# Patient Record
Sex: Female | Born: 1935 | Race: White | Hispanic: No | Smoking: Never smoker
Health system: Southern US, Community
[De-identification: ages and names within clinical notes are randomized; demographics above are authoritative.]

## PROBLEM LIST (undated history)

## (undated) DIAGNOSIS — I509 Heart failure, unspecified: Secondary | ICD-10-CM

## (undated) DIAGNOSIS — I5022 Chronic systolic (congestive) heart failure: Secondary | ICD-10-CM

## (undated) DIAGNOSIS — T8859XA Other complications of anesthesia, initial encounter: Secondary | ICD-10-CM

## (undated) DIAGNOSIS — Z950 Presence of cardiac pacemaker: Secondary | ICD-10-CM

## (undated) DIAGNOSIS — J449 Chronic obstructive pulmonary disease, unspecified: Secondary | ICD-10-CM

## (undated) DIAGNOSIS — M159 Polyosteoarthritis, unspecified: Secondary | ICD-10-CM

## (undated) DIAGNOSIS — M810 Age-related osteoporosis without current pathological fracture: Secondary | ICD-10-CM

## (undated) DIAGNOSIS — I4891 Unspecified atrial fibrillation: Secondary | ICD-10-CM

## (undated) DIAGNOSIS — T4145XA Adverse effect of unspecified anesthetic, initial encounter: Secondary | ICD-10-CM

## (undated) HISTORY — PX: CARDIOVERSION: SHX1299

## (undated) HISTORY — DX: Unspecified atrial fibrillation: I48.91

## (undated) HISTORY — DX: Chronic systolic (congestive) heart failure: I50.22

## (undated) HISTORY — DX: Age-related osteoporosis without current pathological fracture: M81.0

## (undated) HISTORY — PX: INSERT / REPLACE / REMOVE PACEMAKER: SUR710

## (undated) HISTORY — DX: Polyosteoarthritis, unspecified: M15.9

## (undated) HISTORY — PX: HIP FRACTURE SURGERY: SHX118

---
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
## 2007-10-09 ENCOUNTER — Other Ambulatory Visit: Payer: Self-pay

## 2007-10-09 ENCOUNTER — Ambulatory Visit: Payer: Self-pay | Admitting: Internal Medicine

## 2007-10-09 ENCOUNTER — Inpatient Hospital Stay: Payer: Self-pay | Admitting: Internal Medicine

## 2007-10-15 ENCOUNTER — Other Ambulatory Visit: Payer: Self-pay

## 2007-10-16 ENCOUNTER — Other Ambulatory Visit: Payer: Self-pay

## 2013-05-04 ENCOUNTER — Ambulatory Visit (INDEPENDENT_AMBULATORY_CARE_PROVIDER_SITE_OTHER): Payer: Medicare Other | Admitting: Internal Medicine

## 2013-05-04 ENCOUNTER — Encounter: Payer: Self-pay | Admitting: Internal Medicine

## 2013-05-04 VITALS — BP 110/60 | HR 50 | Temp 97.7°F | Ht 59.0 in | Wt 142.0 lb

## 2013-05-04 DIAGNOSIS — F0280 Dementia in other diseases classified elsewhere without behavioral disturbance: Secondary | ICD-10-CM

## 2013-05-04 DIAGNOSIS — I4891 Unspecified atrial fibrillation: Secondary | ICD-10-CM | POA: Insufficient documentation

## 2013-05-04 DIAGNOSIS — M159 Polyosteoarthritis, unspecified: Secondary | ICD-10-CM | POA: Insufficient documentation

## 2013-05-04 DIAGNOSIS — F068 Other specified mental disorders due to known physiological condition: Secondary | ICD-10-CM

## 2013-05-04 DIAGNOSIS — I480 Paroxysmal atrial fibrillation: Secondary | ICD-10-CM | POA: Insufficient documentation

## 2013-05-04 DIAGNOSIS — I5022 Chronic systolic (congestive) heart failure: Secondary | ICD-10-CM

## 2013-05-04 DIAGNOSIS — M810 Age-related osteoporosis without current pathological fracture: Secondary | ICD-10-CM | POA: Insufficient documentation

## 2013-05-04 NOTE — Progress Notes (Signed)
Subjective:    Patient ID: Diana Powell, female    DOB: [birth year], 79 y.o.   MRN: [id number]  HPI Here with son [place] interpreter--- Shary Key Has been here in [place] for 5 years with green card Had been back and forth from [place] for 15 years before that  Has been getting charity care at [facility] Now on [place] and has been assigned a different doctor  Has history of CHF--sounds like systolic Hospitalized in 2009 for this  Permanent atrial fibrillation On coumadin and metoprolol  Has osteoporosis and arthritis  Most important issue is her immigration status They are hoping to get [place] citizenship but she cannot fulfill the citizenship and language parts  She does have mental decline that goes back over 2 years She was able to talk and understand some English--- this has declined and worsened Now has expressive problems even in [place] There is memory issues---forgetting names she always knew, names of actions and activities Forgets her medications, no longer able to read and do puzzles, etc No longer able to cook like in the past (or recount her recipes) No longer can count to do her cross-stitching Has gotten confused about where she is--thought she was in [place] again or in one of her daughter's houses Now reclusive--not willing to go out of the house or backyard  No history of stroke No CT or MRI in past that son is aware of  No current outpatient prescriptions on file prior to visit.   No current facility-administered medications on file prior to visit.    No Known Allergies  Past Medical History  Diagnosis Date  . Atrial fibrillation   . Chronic systolic heart failure   . Osteoporosis, post-menopausal   . Osteoarthritis, multiple sites     Past Surgical History  Procedure Laterality Date  . Cardioversion  ~2009  . Hip fracture surgery Bilateral     each hip fractured at separate times    No family history on file.  History   Social History  .  Marital Status: Widowed    Spouse Name: N/A    Number of Children: 3  . Years of Education: N/A   Occupational History  . 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 Smoking status: Never Smoker   . Smokeless tobacco: Never Used  . Alcohol Use: Yes  . Drug Use: No  . Sexual Activity: Not on file   Other Topics Concern  . Not on file   Social History Narrative   Widowed twice   Son and 2 daughters   Review of Systems Has had some pain in posterior neck Chronic sleep problems Appetite is waning---may have lost a small amount of weight Has had worsening urinary incontinence over the past few years Gait is slow and labored. Has had some falls in the past year Not depressed    Objective:   Physical Exam  Constitutional: She appears well-developed and well-nourished. No distress.  HENT:  Seems to hear okay in quiet room  Neck: Normal range of motion. Neck supple. No thyromegaly present.  Cardiovascular: Regular rhythm and normal heart sounds.  Exam reveals no gallop.   No murmur heard. Mild bradycardia  Abdominal: Soft. There is no tenderness.  Musculoskeletal: She exhibits no edema and no tenderness.  Lymphadenopathy:    She has no cervical adenopathy.  Neurological:  Through interpreter-- "Friday" doesn't know month or year. "[place], [place]" [phone] Recall 2/3  Clock draw--needed to put  some of the numbers outside the circle  Psychiatric:  Doesn't seem depressed Willing to interact and work with my testing          Assessment & Plan:

## 2013-05-04 NOTE — Assessment & Plan Note (Signed)
Pain doesn't seem to be a problem

## 2013-05-04 NOTE — Assessment & Plan Note (Signed)
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

## 2013-05-04 NOTE — Assessment & Plan Note (Signed)
Seems to be compensated Weight is down a little recently

## 2013-05-04 NOTE — Assessment & Plan Note (Signed)
Regular but slow No symptoms from the bradycardia May need to decrease some of her meds--might be able to stop the diltiazem

## 2013-10-21 ENCOUNTER — Inpatient Hospital Stay: Payer: Self-pay | Admitting: Internal Medicine

## 2013-10-21 LAB — CBC
HCT: 36.7 % (ref 35.0–47.0)
HGB: 11.8 g/dL — AB (ref 12.0–16.0)
MCH: 26.2 pg (ref 26.0–34.0)
MCHC: 32.1 g/dL (ref 32.0–36.0)
MCV: 82 fL (ref 80–100)
PLATELETS: 255 10*3/uL (ref 150–440)
RBC: 4.5 10*6/uL (ref 3.80–5.20)
RDW: 15 % — AB (ref 11.5–14.5)
WBC: 7.9 10*3/uL (ref 3.6–11.0)

## 2013-10-21 LAB — BASIC METABOLIC PANEL
Anion Gap: 5 — ABNORMAL LOW (ref 7–16)
BUN: 20 mg/dL — ABNORMAL HIGH (ref 7–18)
CHLORIDE: 101 mmol/L (ref 98–107)
CO2: 32 mmol/L (ref 21–32)
Calcium, Total: 8.5 mg/dL (ref 8.5–10.1)
Creatinine: 1.43 mg/dL — ABNORMAL HIGH (ref 0.60–1.30)
EGFR (African American): 39 — ABNORMAL LOW
GFR CALC NON AF AMER: 34 — AB
Glucose: 111 mg/dL — ABNORMAL HIGH (ref 65–99)
OSMOLALITY: 279 (ref 275–301)
Potassium: 3.6 mmol/L (ref 3.5–5.1)
SODIUM: 138 mmol/L (ref 136–145)

## 2013-10-21 LAB — PROTIME-INR
INR: >15.1
INR: 1.7
PROTHROMBIN TIME: 19.6 s — AB (ref 11.5–14.7)
Prothrombin Time: >120 secs (ref 11.5–14.7)

## 2013-10-21 LAB — TROPONIN I: Troponin-I: <0.02 ng/mL

## 2013-10-22 LAB — BASIC METABOLIC PANEL
Anion Gap: 4 — ABNORMAL LOW (ref 7–16)
BUN: 20 mg/dL — ABNORMAL HIGH (ref 7–18)
CHLORIDE: 100 mmol/L (ref 98–107)
CREATININE: 1.19 mg/dL (ref 0.60–1.30)
Calcium, Total: 8.4 mg/dL — ABNORMAL LOW (ref 8.5–10.1)
Co2: 32 mmol/L (ref 21–32)
EGFR (African American): 49 — ABNORMAL LOW
EGFR (Non-African Amer.): 42 — ABNORMAL LOW
Glucose: 150 mg/dL — ABNORMAL HIGH (ref 65–99)
OSMOLALITY: 277 (ref 275–301)
Potassium: 3.5 mmol/L (ref 3.5–5.1)
SODIUM: 136 mmol/L (ref 136–145)

## 2013-10-22 LAB — PROTIME-INR
INR: 1.4
Prothrombin Time: 17 secs — ABNORMAL HIGH (ref 11.5–14.7)

## 2014-11-09 NOTE — Discharge Summary (Signed)
PATIENT NAME:  Diana Powell, Iyannah MR#:  [id number] DATE OF BIRTH:  1926-09-04  DATE OF ADMISSION:  10/21/2013  DATE OF DISCHARGE:  10/22/2013  ADMITTING DIAGNOSES:  Severe neck pain as well as supratherapeutic INR.   DISCHARGE DIAGNOSES:  1.  Severe neck pain due to severe C-spine degenerative disc disease needs outpatient neurosurgical follow up.  2.  Supratherapeutic INR, possibly related to medication interaction. INR greater than 15.1 on presentation. On discharge, it was 1.4.  3.  History of atrial fibrillation.  4.  Chronic neck pain.  5.  History of congestive heart failure, unknown ejection fraction.  6.  Osteoporosis.  7.  Status post bilateral hip replacement surgeries.   PERTINENT LABS AND EVALUATIONS:  Admitting WBC 7.9, hemoglobin 11.8, platelet count 255,000. Sodium 138, potassium 3.6, chloride 111, bicarbonate 32, BUN 20, creatinine 1.40, glucose 111. INR greater than 15. Troponin less than 0.02.   Chest x-ray showed convex right-sided thoracic scoliosis, mild cardiomegaly, no acute pulmonary processes. CT of the head was negative CT of the neck with contrast showing no acute findings. However, there was C5-C6, C6-C7 severe left-sided neck disc space narrowing on the left side.    EKG: Normal sinus rhythm with no ST-T wave changes.   HOSPITAL COURSE: Please refer to H and P done by the admitting physician. The patient is an 81 year old [place] speaking white female presented with basically worsening neck pain. She does have a history of chronic neck pain, but it had gotten so severe, and she was in a lot of pain. Due to this, she came to the ED. She was evaluated in the ER had a CT scan of the neck which did not show any acute abnormality. Did show severe disc space narrowing. She also had an INR checked because she was on Coumadin. Her INR was significantly elevated. Due to these symptoms, she was admitted for her elevated INR. She was given FFP in the ER. Subsequently INR did drift  down to normal. She was admitted to the hospital, pain control with Medrol Dosepak, and other pain medications, muscle relaxants were started. With these treatments, her symptoms did improve. She will need an outpatient neurosurgical back spinal specialist to see her for further evaluation and treatment.   DISCHARGE MEDICATIONS: At the time of discharge:  Lasix 20 daily as needed for swelling, tramadol 50 q. 6 p.r.n. for pain, triamcinolone topically applied to affected area b.i.d. as before, calcium plus vitamin D 1 tablet daily, amiodarone 400 daily, metoprolol 100 mg 1 tablet p.o. b.i.d., verapamil 120 1/2 tablet daily, Coumadin 2 mg daily, prednisone taper starts at 60 mg, 10 mg until complete, Skelaxin 800 mg 1 tablet p.o. t.i.d. as needed for muscle spasms, Percocet 5/325 q. 6 p.r.n. for pain. Due to her renal function and being elevated her chlorthalidone was held.   HOME HEALTH:  No.   DIET: Low-sodium, low-fat, low-cholesterol.   ACTIVITY: As tolerated.   FOLLOWUP: With primary M.D. in 1-2 weeks. Also follow [facility] locally or [facility], or in [place] for opinion regarding her neck issues.   TIME SPENT: 35 minutes.     ____________________________ Lacie ScottsShreyang H. Allena KatzPatel, MD shp:tc D: 10/23/2013 11:27:14 ET T: 10/23/2013 12:23:14 ET JOB#: [id number]  cc: Idara Woodside H. Allena KatzPatel, MD, <Dictator> Charise CarwinSHREYANG H Karrington Studnicka MD ELECTRONICALLY SIGNED 10/31/2013 15:38

## 2014-11-09 NOTE — H&P (Signed)
PATIENT NAME:  Diana Powell, Diana Powell MR#:  [id number] DATE OF BIRTH:  1926-10-05  DATE OF ADMISSION:  10/21/2013  ADMITTING PHYSICIAN: Enid Baasadhika Niki Payment, M.D.   PRIMARY CARE PHYSICIAN: [facility] Mar[facility]obin [facility]   PRIMARY CARDIOLOGIST: [facility]   CHIEF COMPLAINT: Severe neck pain.   HISTORY OF PRESENT ILLNESS: Diana Powell is an 81 year old [place] speaking elderly  female with past medical history significant for atrial fibrillation, on Coumadin, congestive heart failure with unknown ejection fraction and osteoporosis, who presents to the hospital secondary to acute onset of neck pain that started last evening. The patient can only speak [place] and her son at bedside is able to translate at this time. According to the son, the patient has been having the neck pain for more than six months and primary care physician has been trying to use Percocet and tramadol as needed.  However, she has some good days and days, and she has been feeling better over the last week without any pain, but suddenly yesterday  she started to feel bad with extremely worsening neck pain, spasms in the trapezius muscles and unable to move her neck. She is wearing a soft neck support, felt better for a short while and again last night pain got worse to the point that they had to  come to the Emergency Room. In the Emergency Room she is in a soft collar, got some pain medications, still feels very uncomfortable  and labs reveal that INR is greater than 15. She does have mild conjunctival hemorrhage on the left,  but denies any bleeding from anywhere else. She is being admitted for her neck pain and also supratherapeutic INR.   PAST MEDICAL HISTORY: 1. Atrial fibrillation on Coumadin.  2. Congestive heart failure with unknown ejection fraction.  3. Osteoporosis.   PAST SURGICAL HISTORY: Bilateral hip replacement surgery.   ALLERGIES TO MEDICATIONS: No known drug allergies.   CURRENT HOME MEDICATIONS:  1.  Amiodarone 400 mg p.o. daily.  2. Calcium vitamin D 1 tablet p.o. daily.  3. Chlorthalidone 25 mg p.o. daily.  4. Coumadin 2.5 mg p.o. daily.  5. Lasix 20 mg p.o. daily.  6. Metoprolol 100 mg p.o. b.i.d.  7. Tramadol 50 mg p.o. q.6h. p.r.n.  8. Triamcinolone 0.1% apply  twice a day.  9. Verapamil 60 mg p.o. daily.   SOCIAL HISTORY: Lives at home with her son, ambulates with a cane at baseline. However, her mobility has been decreased  due to generalized weakness. No history of any smoking or alcohol use.   FAMILY HISTORY: Dad was a World Psychologist, clinicalWar Veteran and passed away in the war  , and mom was healthy up until her 5680s and passed away with natural causes.  REVIEW OF SYSTEMS:  CONSTITUTIONAL: No fever, fatigue or weakness.  EYES: No blurring vision, double vision, inflammation, glaucoma.  ENT: No tinnitus, ear pain, hearing loss, epistaxis or discharge.  RESPIRATORY: No cough, wheeze, hemoptysis, COPD. CARDIOVASCULAR: No chest pain, orthopnea, edema, arrhythmia, palpitations, or syncope.  GASTROINTESTINAL: No nausea, vomiting, diarrhea, or abdominal pain, hematemesis or melena.  GENITOURINARY: No dysuria, hematuria, renal calculus, frequency or incontinence.  ENDOCRINE: No polyuria, nocturia, thyroid problems, heat or  cold intolerance.  HEMATOLOGY: No anemia, easy bruising or bleeding.  SKIN: No acne, rash or lesions.  MUSCULOSKELETAL: Positive for intense neck pain and spasms down the neck muscles and also positive for arthritis. No gout.  NEUROLOGIC:  No numbness, weakness, CVA, TIA or seizures.  PSYCHOLOGICAL: No  anxiety, insomnia or depression.   PHYSICAL EXAMINATION: VITAL SIGNS: Temperature 97.4 degree Fahrenheit, pulse 87, respirations 22, blood pressure 144/88, pulse oximetry 96% on room air.   GENERAL: Well-built, well-nourished female sitting in bed in mild discomfort secondary to her neck pain.  HEENT: Normocephalic, atraumatic. Pupils are equal, round, reacting to light,  anicteric sclerae.   Left eye medial subconjunctival hemorrhage is present. Extraocular movements are intact.  OROPHARYNX: Clear without erythema, masses  or exudate.Marland Kitchen  NECK: Supple, no thyromegaly, JVD, or carotid bruits , no lymphadenopathy.  LUNGS: Moving air bilaterally. No wheeze or crackles. No use of accessory muscles for breathing.  CARDIOVASCULAR: S1 and S2 regular rate and rhythm,  No murmurs, rubs, or gallops.  ABDOMEN: Soft, nontender, nondistended. No hepatosplenomegaly, normal bowel sounds.  EXTREMITIES: No pedal edema. No clubbing or cyanosis, 2+ dorsalis pedis pulse is palpable bilaterally.  SKIN: No acnes, rash or lesions.  LYMPHATICS: no cervical or inguinal lymphadenopathy .  NEUROLOGICAL: Cranial nerves intact. She has a soft neck collar and has some tenderness around the posterior neck at the  base of the neck region; however, normal range of motion is present. no new focal motor or sensory deficits  . PSYCHOLOGICAL: The patient is awake, alert, oriented x 3.   LABORATORY DATA: WBC 7.9, hemoglobin 11.8, hematocrit 36.7, platelet count 255.   Sodium 138, potassium  3.6, chloride 111, bicarbonate 32, BUN 20, creatinine 1.40, glucose 111 and calcium of 8.5 , INR is greater than 15. Troponin is less than 0.02.   Chest x-ray showing convex right sided thoracic scoliosis noted. Mild cardiomegaly, no acute cardiopulmonary disease.   CT of the head without contrast showing atrophy and small vessel ischemic changes. No acute intracranial findings, and CT of the neck with contrast showing no acute finding; however,  there is C5-C6, C6-C7, severe left-sided disk space narrowing and left greater than right facet arthropathy is also noted in the the upper cervical levels. Severe spondylitic changes are present.  EKG showing atrial fibrillation, heart rate of 84. No acute ST-T wave changes.   ASSESSMENT AND PLAN:  This is an 81 year old female with history of atrial fibrillation on  Coumadin, congestive heart failure with ejection fraction of 50% to 55% in the past,  chronic pain, comes in with acute on chronic neck pain and noted to have  INR greater than 15.  1. Supratherapeutic INR, has been on Coumadin for several years, dietary changes recently could have been the cause for this supratherapeutic INR; however hold Coumadin. She has been given FFP,  recheck INR later today and tomorrow a.m.  2. Acute on chronic neck pain. Neck with scoliosis and C5-C7 disc space narrowing worse on left-side- it  could have been the cause. We will start a Medrol Dosepak, Skelaxin, soft neck collar, heating pad and morphine p.r.n.  Neurosurgery follow-up as an outpatient. The patient also has severe scoliosis.  3. Atrial fibrillation, rate controlled. Continue amiodarone, verapamil, and metoprolol. Hold Coumadin. 4. Congestive heart failure,  unknown ejection fraction, likely diastolic dysfunction based on her last echo in 2009 where ejection fraction was 50% to 55%. We will hold Lasix as she appears clinically dehydrated and also with acute renal failure on labs. Gentle hydration and recheck in a.m. 5. Acute renal failure,  could be prerenal.  Gentle hydration and recheck tomorrow. 6. Hypertension, well controlled.  Lasix and chlorthalidone are on hold. Continue with metoprolol and verapamil.    7. CODE STATUS: FULL CODE.   TIME SPENT  ON ADMISSION: 50 minutes.   ____________________________ Enid Baas, MD rk:sg D: 10/21/2013 11:43:01 ET T: 10/21/2013 13:24:06 ET JOB#: [id number]  cc: Enid Baas, MD, <Dictator> Vic Ripper. Mariana Kaufman, MD      Enid Baas MD ELECTRONICALLY SIGNED 11/06/2013 11:17

## 2017-12-17 ENCOUNTER — Emergency Department
Admission: EM | Admit: 2017-12-17 | Discharge: 2017-12-17 | Disposition: A | Discharge status: Home / Self Care | Payer: Medicare Other | Attending: Emergency Medicine | Admitting: Emergency Medicine

## 2017-12-17 ENCOUNTER — Other Ambulatory Visit: Payer: Self-pay

## 2017-12-17 ENCOUNTER — Emergency Department: Payer: Medicare Other

## 2017-12-17 ENCOUNTER — Encounter: Payer: Self-pay | Admitting: Emergency Medicine

## 2017-12-17 DIAGNOSIS — Y93H2 Activity, gardening and landscaping: Secondary | ICD-10-CM | POA: Diagnosis not present

## 2017-12-17 DIAGNOSIS — W010XXA Fall on same level from slipping, tripping and stumbling without subsequent striking against object, initial encounter: Secondary | ICD-10-CM | POA: Insufficient documentation

## 2017-12-17 DIAGNOSIS — S52501A Unspecified fracture of the lower end of right radius, initial encounter for closed fracture: Secondary | ICD-10-CM

## 2017-12-17 DIAGNOSIS — Z79899 Other long term (current) drug therapy: Secondary | ICD-10-CM | POA: Diagnosis not present

## 2017-12-17 DIAGNOSIS — Y998 Other external cause status: Secondary | ICD-10-CM | POA: Diagnosis not present

## 2017-12-17 DIAGNOSIS — I5022 Chronic systolic (congestive) heart failure: Secondary | ICD-10-CM | POA: Diagnosis not present

## 2017-12-17 DIAGNOSIS — Y92017 Garden or yard in single-family (private) house as the place of occurrence of the external cause: Secondary | ICD-10-CM | POA: Diagnosis not present

## 2017-12-17 DIAGNOSIS — F0390 Unspecified dementia without behavioral disturbance: Secondary | ICD-10-CM | POA: Insufficient documentation

## 2017-12-17 DIAGNOSIS — Z7901 Long term (current) use of anticoagulants: Secondary | ICD-10-CM | POA: Diagnosis not present

## 2017-12-17 DIAGNOSIS — S59911A Unspecified injury of right forearm, initial encounter: Secondary | ICD-10-CM | POA: Diagnosis present

## 2017-12-17 MED ORDER — TRAMADOL HCL 50 MG PO TABS: 25.0000 mg | ORAL_TABLET | Freq: Four times a day (QID) | ORAL | 0 refills | Status: AC | PRN

## 2017-12-17 MED ORDER — TRAMADOL HCL 50 MG PO TABS
50.0000 mg | ORAL_TABLET | Freq: Once | ORAL | Status: AC
  Administered 2017-12-17: 50 mg via ORAL
  Filled 2017-12-17: qty 1

## 2017-12-17 NOTE — ED Provider Notes (Signed)
[facility] Emergency Department Provider Note  ____________________________________________  Time seen: Approximately 5:18 PM  I have reviewed the triage vital signs and the nursing notes.   HISTORY  Chief Complaint Wrist Pain    HPI Diana Powell is a 84 y.o. female that presents to the emergency department for evaluation of right arm pain after fall today.  Patient was out working in the garden when she lost her balance and fell.  She did not hit her head or lose consciousness.  She denies any additional injuries.  No numbness or tingling in fingers.   Past Medical History:  Diagnosis Date  . Atrial fibrillation (HCC)   . Chronic systolic heart failure (HCC)   . Osteoarthritis, multiple sites   . Osteoporosis, post-menopausal     Patient Active Problem List   Diagnosis Date Noted  . 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 Atrial fibrillation (HCC)   . Chronic systolic heart failure (HCC)   . Osteoporosis, post-menopausal   . Osteoarthritis, multiple sites     Past Surgical History:  Procedure Laterality Date  . CARDIOVERSION  ~2009  . HIP FRACTURE SURGERY Bilateral [phone]   each hip fractured at separate times    Prior to Admission medications   Medication Sig Start Date End Date Taking? Authorizing Provider  amiodarone (PACERONE) 200 MG tablet Take 200 mg by mouth daily.    [provider]  Calcium Carbonate-Vitamin D (CALCIUM-VITAMIN D) 500-200 MG-UNIT per tablet Take 1 tablet by mouth daily.    [provider]  chlorthalidone (HYGROTON) 25 MG tablet Take 25 mg by mouth daily.    [provider]  furosemide (LASIX) 20 MG tablet Take 20 mg by mouth daily as needed.    [provider]  metoprolol (LOPRESSOR) 100 MG tablet Take 50 mg by mouth 2 (two) times daily.    [provider]  traMADol (ULTRAM) 50 MG tablet Take 0.5 tablets (25 mg total) by mouth  every 6 (six) hours as needed. 12/17/17 12/17/18  Enid Derry, PA-C  verapamil (CALAN-SR) 120 MG CR tablet Take 60 mg by mouth at bedtime.    [provider]  warfarin (COUMADIN) 2.5 MG tablet as directed.    [provider]    Allergies Patient has no known allergies.  History reviewed. No pertinent family history.  Social History Social History   Tobacco Use  . Smoking status: Never Smoker  . Smokeless tobacco: Never Used  Substance Use Topics  . Alcohol use: Yes  . Drug use: No     Review of Systems  Cardiovascular: No chest pain. Respiratory: No SOB. Gastrointestinal:  No nausea, no vomiting.  Musculoskeletal: Positive for wrist pain. Skin: Negative for rash, abrasions, lacerations.  Positive for ecchymosis.  Neurological: Negative for  numbness or tingling   ____________________________________________   PHYSICAL EXAM:  VITAL SIGNS: ED Triage Vitals  Enc Vitals Group     BP 12/17/17 1354 121/62     Pulse Rate 12/17/17 1354 70     Resp 12/17/17 1354 16     Temp 12/17/17 1354 98.6 F (37 C)     Temp Source 12/17/17 1354 Oral     SpO2 12/17/17 1354 96 %     Weight 12/17/17 1353 126 lb (57.2 kg)     Height 12/17/17 1353 5' (1.524 m)     Head Circumference --      Peak Flow --      Pain Score 12/17/17  1359 8     Pain Loc --      Pain Edu? --      Excl. in GC? --      Constitutional: Alert and oriented. Well appearing and in no acute distress. Eyes: Conjunctivae are normal. PERRL. EOMI. Head: Atraumatic. ENT:      Ears:      Nose: No congestion/rhinnorhea.      Mouth/Throat: Mucous membranes are moist.  Neck: No stridor.   Cardiovascular: Normal rate, regular rhythm.  Good peripheral circulation.  Symmetric radial pulses bilaterally.  Cap refill less than 3 seconds. Respiratory: Normal respiratory effort without tachypnea or retractions. Lungs CTAB. Good air entry to the bases with no decreased or absent breath  sounds. Musculoskeletal: No gross deformities appreciated.  Moderate swelling to right wrist.  Ecchymosis to volar wrist. Full ROM of fingers. Neurologic:  Normal speech and language. No gross focal neurologic deficits are appreciated.  Skin:  Skin is warm, dry and intact. No rash noted.    ____________________________________________   LABS (all labs ordered are listed, but only abnormal results are displayed)  Labs Reviewed - No data to display ____________________________________________  EKG   ____________________________________________  RADIOLOGY Lexine BatonI, Malone Admire, personally viewed and evaluated these images (plain radiographs) as part of my medical decision making, as well as reviewing the written report by the radiologist.  Dg Wrist Complete Left  Result Date: 12/17/2017 CLINICAL DATA:  Left wrist a fall injury with swelling. Initial and bruising after encounter. EXAM: LEFT WRIST - COMPLETE 3+ VIEW COMPARISON:  None. FINDINGS: The bones appear diffusely osteopenic. There is a mildly comminuted fracture of the distal radius which extends to the radiocarpal articular surface. There is mild impaction, displacement, and dorsal angulation. A nondisplaced ulnar styloid fracture is questioned. There is diffuse soft tissue swelling about the wrist. Severe first CMC joint osteoarthrosis is noted with advanced joint space narrowing and bulky marginal osteophyte formation. Mild radiocarpal osteoarthrosis is also present. IMPRESSION: 1. Mildly comminuted distal radius fracture as above. 2. Questionable nondisplaced ulnar styloid fracture. Electronically Signed   By: Sebastian AcheAllen  Grady M.D.   On: 12/17/2017 14:36    ____________________________________________    PROCEDURES  Procedure(s) performed:    Procedures    Medications  traMADol (ULTRAM) tablet 50 mg (50 mg Oral Given 12/17/17 1546)     ____________________________________________   INITIAL IMPRESSION / ASSESSMENT AND PLAN  / ED COURSE  Pertinent labs & imaging results that were available during my care of the patient were reviewed by me and considered in my medical decision making (see chart for details).  Review of the [place] CSRS was performed in accordance of the [facility] prior to dispensing any controlled drugs.     Patient's diagnosis is consistent with distal radius fracture. Vital signs and exam are reassuring.  X-ray consistent with mildly comminuted distal radius fracture.  Splint was placed.   Patient will be discharged home with prescriptions for tramadol. Patient is to follow up with orthopedics as directed. Patient is given ED precautions to return to the ED for any worsening or new symptoms.     ____________________________________________  FINAL CLINICAL IMPRESSION(S) / ED DIAGNOSES  Final diagnoses:  Closed fracture of distal end of right radius, unspecified fracture morphology, initial encounter      NEW MEDICATIONS STARTED DURING THIS VISIT:  ED Discharge Orders        Ordered    traMADol (ULTRAM) 50 MG tablet  Every 6 hours PRN     12/17/17  1650          This chart was dictated using voice recognition software/Dragon. Despite best efforts to proofread, errors can occur which can change the meaning. Any change was purely unintentional.    Enid Derry, PA-C 12/17/17 2255    Minna Antis, MD 12/17/17 2306

## 2017-12-17 NOTE — ED Notes (Signed)
Left wrist swelling, pain, bruising s/p fall. Able to wiggle fingers, sensation intact, fingers warm with normal cap refill.

## 2017-12-17 NOTE — ED Triage Notes (Signed)
Pt to ED c/o left wrist injury with some swelling and bruising.  States was in her garden and tripped and fell, denies LOC or hitting head.  Took 2 500mg  tylenol twice today.

## 2017-12-18 ENCOUNTER — Emergency Department
Admission: EM | Admit: 2017-12-18 | Discharge: 2017-12-18 | Disposition: A | Discharge status: Home / Self Care | Payer: Medicare Other | Attending: Emergency Medicine | Admitting: Emergency Medicine

## 2017-12-18 ENCOUNTER — Emergency Department: Payer: Medicare Other

## 2017-12-18 ENCOUNTER — Other Ambulatory Visit: Payer: Self-pay

## 2017-12-18 DIAGNOSIS — I4891 Unspecified atrial fibrillation: Secondary | ICD-10-CM | POA: Insufficient documentation

## 2017-12-18 DIAGNOSIS — I5022 Chronic systolic (congestive) heart failure: Secondary | ICD-10-CM | POA: Diagnosis not present

## 2017-12-18 DIAGNOSIS — S52502D Unspecified fracture of the lower end of left radius, subsequent encounter for closed fracture with routine healing: Secondary | ICD-10-CM | POA: Insufficient documentation

## 2017-12-18 DIAGNOSIS — Z79899 Other long term (current) drug therapy: Secondary | ICD-10-CM | POA: Insufficient documentation

## 2017-12-18 DIAGNOSIS — Y33XXXD Other specified events, undetermined intent, subsequent encounter: Secondary | ICD-10-CM | POA: Diagnosis not present

## 2017-12-18 DIAGNOSIS — S62102D Fracture of unspecified carpal bone, left wrist, subsequent encounter for fracture with routine healing: Secondary | ICD-10-CM

## 2017-12-18 MED ORDER — DOCUSATE SODIUM 100 MG PO CAPS: 100.0000 mg | ORAL_CAPSULE | Freq: Every day | ORAL | 2 refills | Status: AC | PRN

## 2017-12-18 MED ORDER — OXYCODONE-ACETAMINOPHEN 5-325 MG PO TABS
1.0000 | ORAL_TABLET | ORAL | Status: DC | PRN
  Administered 2017-12-18: 1 via ORAL

## 2017-12-18 MED ORDER — OXYCODONE-ACETAMINOPHEN 5-325 MG PO TABS: 1.0000 | ORAL_TABLET | ORAL | 0 refills | Status: DC | PRN

## 2017-12-18 MED ORDER — MORPHINE SULFATE (PF) 4 MG/ML IV SOLN
4.0000 mg | Freq: Once | INTRAVENOUS | Status: AC
  Administered 2017-12-18: 4 mg via INTRAMUSCULAR
  Filled 2017-12-18: qty 1

## 2017-12-18 MED ORDER — OXYCODONE-ACETAMINOPHEN 5-325 MG PO TABS
ORAL_TABLET | ORAL | Status: AC
  Administered 2017-12-18: 1 via ORAL
  Filled 2017-12-18: qty 1

## 2017-12-18 NOTE — ED Provider Notes (Signed)
[facility] Emergency Department Provider Note  ____________________________________________   I have reviewed the triage vital signs and the nursing notes. Where available I have reviewed prior notes and, if possible and indicated, outside hospital notes.    HISTORY  Chief Complaint Arm Pain    HPI Genia HotterGalina Montemurro is a 84 y.o. female  Broke her arm yesterday, it was splinted and she went home but she was not having good pain control with tramadol.  Patient has no numbness or weakness, the family tried removing the Ace wrap but there was no significant decrease in her discomfort.  She does have some bruising there.  No other complaints.  Patient's history is through her son who interprets, the decline translator service  Past Medical History:  Diagnosis Date  . Atrial fibrillation (HCC)   . Chronic systolic heart failure (HCC)   . Osteoarthritis, multiple sites   . Osteoporosis, post-menopausal     Patient Active Problem List   Diagnosis Date Noted  . 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 Atrial fibrillation (HCC)   . Chronic systolic heart failure (HCC)   . Osteoporosis, post-menopausal   . Osteoarthritis, multiple sites     Past Surgical History:  Procedure Laterality Date  . CARDIOVERSION  ~2009  . HIP FRACTURE SURGERY Bilateral [id number],1996   each hip fractured at separate times    Prior to Admission medications   Medication Sig Start Date End Date Taking? Authorizing Provider  amiodarone (PACERONE) 200 MG tablet Take 200 mg by mouth daily.    [provider]  Calcium Carbonate-Vitamin D (CALCIUM-VITAMIN D) 500-200 MG-UNIT per tablet Take 1 tablet by mouth daily.    [provider]  chlorthalidone (HYGROTON) 25 MG tablet Take 25 mg by mouth daily.    [provider]  furosemide (LASIX) 20 MG tablet Take 20 mg by mouth daily as needed.    [provider]  metoprolol  (LOPRESSOR) 100 MG tablet Take 50 mg by mouth 2 (two) times daily.    [provider]  traMADol (ULTRAM) 50 MG tablet Take 0.5 tablets (25 mg total) by mouth every 6 (six) hours as needed. 12/17/17 12/17/18  Enid DerryWagner, Ashley, PA-C  verapamil (CALAN-SR) 120 MG CR tablet Take 60 mg by mouth at bedtime.    [provider]  warfarin (COUMADIN) 2.5 MG tablet as directed.    [provider]    Allergies Patient has no known allergies.  No family history on file.  Social History Social History   Tobacco Use  . Smoking status: Never Smoker  . Smokeless tobacco: Never Used  Substance Use Topics  . Alcohol use: Yes  . Drug use: No    Review of Systems See HPI   ____________________________________________   PHYSICAL EXAM:  VITAL SIGNS: ED Triage Vitals  Enc Vitals Group     BP 12/18/17 1056 (!) 113/57     Pulse Rate 12/18/17 1056 70     Resp 12/18/17 1056 16     Temp 12/18/17 1056 97.8 F (36.6 C)     Temp Source 12/18/17 1056 Oral     SpO2 12/18/17 1056 94 %     Weight 12/18/17 1100 126 lb (57.2 kg)     Height 12/18/17 1100 5' (1.524 m)     Head Circumference --      Peak Flow --      Pain Score 12/18/17 1059 9     Pain Loc --  Pain Edu? --      Excl. in GC? --     Constitutional: Alert and oriented. Well appearing and in no acute distress.  Musculoskeletal: No lower extremity tenderness, the splint is clean dry and intact, she has strong distal pulses good cap refill, there is bruising noted to the proximal, there is intact sensation and strength to the extent that I can determine in all fields radial, medial and ulnar.  No joint effusions, no DVT signs strong distal pulses no edema Neurologic:  Normal speech and language. No gross focal neurologic deficits are appreciated.  Skin:  Skin is warm, dry and intact. No rash noted. Psychiatric: Mood and affect are normal. Speech and behavior are  normal.  ____________________________________________   LABS (all labs ordered are listed, but only abnormal results are displayed)  Labs Reviewed - No data to display  Pertinent labs  results that were available during my care of the patient were reviewed by me and considered in my medical decision making (see chart for details). ____________________________________________  EKG  I personally interpreted any EKGs ordered by me or triage  ____________________________________________  RADIOLOGY  Pertinent labs & imaging results that were available during my care of the patient were reviewed by me and considered in my medical decision making (see chart for details). If possible, patient and/or family made aware of any abnormal findings.  Dg Wrist Complete Left  Result Date: 12/17/2017 CLINICAL DATA:  Left wrist a fall injury with swelling. Initial and bruising after encounter. EXAM: LEFT WRIST - COMPLETE 3+ VIEW COMPARISON:  None. FINDINGS: The bones appear diffusely osteopenic. There is a mildly comminuted fracture of the distal radius which extends to the radiocarpal articular surface. There is mild impaction, displacement, and dorsal angulation. A nondisplaced ulnar styloid fracture is questioned. There is diffuse soft tissue swelling about the wrist. Severe first CMC joint osteoarthrosis is noted with advanced joint space narrowing and bulky marginal osteophyte formation. Mild radiocarpal osteoarthrosis is also present. IMPRESSION: 1. Mildly comminuted distal radius fracture as above. 2. Questionable nondisplaced ulnar styloid fracture. Electronically Signed   By: Sebastian Ache M.D.   On: 12/17/2017 14:36   ____________________________________________    PROCEDURES  Procedure(s) performed: None  Procedures  Critical Care performed: None  ____________________________________________   INITIAL IMPRESSION / ASSESSMENT AND PLAN / ED COURSE  Pertinent labs & imaging results that  were available during my care of the patient were reviewed by me and considered in my medical decision making (see chart for details).  Patient here for pain control after a fracture no evidence of neurovascular compromise I did repeat the x-ray which shows no evidence of decompensation in the actual wound, no evidence of compartment syndrome especially in a nondisplaced fracture of the very unusual, patient with intact pulses and sensation, tramadol is not the best pain medication, will give her morphine here and I will try Percocet they have been advised not to take Percocet and tramadol at the same time.  Patient and family very comfortable with this.    ____________________________________________   FINAL CLINICAL IMPRESSION(S) / ED DIAGNOSES  Final diagnoses:  None      This chart was dictated using voice recognition software.  Despite best efforts to proofread,  errors can occur which can change meaning.      Jeanmarie Plant, MD 12/18/17 1310

## 2017-12-18 NOTE — Discharge Instructions (Addendum)
Take the Percocet as needed for pain, do not take with Tylenol, do not take with tramadol.  Be careful as the Percocet will make her sleepy.  It can also cause constipation and she has constipation use the Colace.  If there is no constipation you do not need to use the Colace.  If there is numbness or tingling or increased pain or other concerns please return to the emergency department.  If she has acute numbness or pain, consider as you did loosening but not removing the splint.  Follow closely with orthopedic surgery tomorrow.

## 2017-12-18 NOTE — ED Triage Notes (Signed)
Pt was seen here yesterday with a fracture to the left upper extremity, came back today with increased pain. Pt was not provided a arm sling.. Pt gave permission to wave interpretor and have her son to interpret. Pt speaks russian.

## 2018-02-20 ENCOUNTER — Other Ambulatory Visit: Payer: Self-pay

## 2018-02-20 ENCOUNTER — Ambulatory Visit: Payer: Medicare Other | Attending: Orthopedic Surgery | Admitting: Occupational Therapy

## 2018-02-20 DIAGNOSIS — M6281 Muscle weakness (generalized): Secondary | ICD-10-CM | POA: Diagnosis present

## 2018-02-20 DIAGNOSIS — M25632 Stiffness of left wrist, not elsewhere classified: Secondary | ICD-10-CM | POA: Diagnosis present

## 2018-02-20 DIAGNOSIS — M25532 Pain in left wrist: Secondary | ICD-10-CM | POA: Insufficient documentation

## 2018-02-20 NOTE — Therapy (Signed)
[facility] [address] [place], [place], [address] Phone: [phone]   Fax:  [phone]  Occupational Therapy Evaluation  Patient Details  Name: Diana Powell MRN: [id number] Date of Birth: Mar 06, 1927 Referring Provider: Floyce StakesGaines    Encounter Date: 02/20/2018  OT End of Session - 02/20/18 2234    Visit Number  1    Number of Visits  4    Date for OT Re-Evaluation  04/24/18    OT Start Time  1405    OT Stop Time  1507    OT Time Calculation (min)  62 min    Activity Tolerance  Patient tolerated treatment well;Patient limited by pain    Behavior During Therapy  [facility] for tasks assessed/performed       Past Medical History:  Diagnosis Date  . Atrial fibrillation (HCC)   . Chronic systolic heart failure (HCC)   . Osteoarthritis, multiple sites   . Osteoporosis, post-menopausal     Past Surgical History:  Procedure Laterality Date  . CARDIOVERSION  ~2009  . HIP FRACTURE SURGERY Bilateral [phone],1996   each hip fractured at separate times    There were no vitals filed for this visit.  Subjective Assessment - 02/20/18 2224    Subjective   Pt and son refuse translator services - pt translate for pt - [place]ian - pt fell in garden on 6/2 - and had dorsal displaced  fx and radial shorting of distal radius - pt was in long forearm cast and then in velcro splint - on last visit with ortho was order to start some AROM and come out of splint some - pt report pain at wrist with use and ROM     Patient Stated Goals  Per son - they want some exercises they can do at home for L wrist     Currently in Pain?  Yes    Pain Score  -- pain can increase with ROM to 7/10     Pain Location  Wrist    Pain Orientation  Left    Pain Descriptors / Indicators  Aching;Sharp    Pain Onset  More than a month ago        [facility] - 02/20/18 0001      Assessment   Medical Diagnosis  L distal radius fx - non operative     Referring Provider  Gaines     Onset Date/Surgical Date  12/18/17    Hand Dominance  Right      Precautions   Required Braces or Orthoses  -- prefab wrist splint       Home  Environment   Lives With  Son      Prior Function   Leisure  did some gardening , suppose  use walker but use furniture in house , do some cross stitch       AROM   Right Wrist Extension  70 Degrees    Right Wrist Flexion  90 Degrees    Right Wrist Radial Deviation  20 Degrees    Right Wrist Ulnar Deviation  27 Degrees    Left Wrist Extension  50 Degrees    Left Wrist Flexion  35 Degrees    Left Wrist Radial Deviation  15 Degrees    Left Wrist Ulnar Deviation  15 Degrees      Left Hand AROM   L Thumb Opposition to Index  -- Oppsition to base of 5th - no pain at  thumb         contrast was done to L hand and wrist prior to review of HEP to decrease pain and increase ROM   Review HEP and handout :     Contrast   and then pain free AROM and AAROM for  Sup /pro   flexion and extention over armrest  And RD, UD with palms together   10 reps  2-3 x day   Splint on and off 2 hrs for few days  And then can try 3 hrs off and 2 hrs on  And then few days 4 off and 2 hrs on   Rest hand in pain free position - not in flexion or supination  And use in pain free range or less than 2/1o pain             OT Education - 02/20/18 2234    Education Details  findings of eval and HEP     Person(s) Educated  Child(ren);Patient    Methods  Demonstration;Handout    Comprehension  Verbalized understanding;Returned demonstration       OT Short Term Goals - 02/20/18 2241      OT SHORT TERM GOAL #1   Title  Pt and son to be ind in HEP to increase AROM in L wrist without increase pain     Baseline  not knowledge - ed today on some AROM and AAROM of L wrist     Time  4    Period  Weeks    Status  New    Target Date  03/20/18        OT Long Term Goals - 02/20/18 2242      OT LONG TERM GOAL #1    Title  Pt AROM in L wrist and strength increase for pt to show increase ext by 20 degrees , and ext , rd, UD by 5-10 degrees to use hand in 50% of ADL's     Baseline  see flowsheet     Time  8    Period  Weeks    Status  New    Target Date  04/17/18            Plan - 02/20/18 2235    Clinical Impression Statement  Pt present at OT eval 9 wks out of L distal radius fx - that showed some radial shortening , dorsal displacement - but good callus formation in last few xrays - pt and son declined  surgical intervention and xrays is showing increase arthritis - pt  show decrease ROM in L wrist in all planes - and report and show increase pain - with krepetis at wrist with AROM assessment - pt  was ed on doing HEP in  pain free range or less than 2/10 pain - do some contrast prior and wean gradually out of splint - pt observe that she rest hand in flexoin over armrest on in supination and then has 7/10  coming out of those positions - pt limited in use of L hand in ADL's and IADL's - pt and sone was ed on HEP - son report they want something they can work on at home but will phone me in week or 2 if need or want a follow up     Occupational performance deficits (Please refer to evaluation for details):  ADL's;IADL's;Leisure;Play    Rehab Potential  Poor    Current Impairments/barriers affecting progress:  pain , non operative and healed  dorsally displace and radial shortening- krepitis at wrist during AROM assessment     OT Frequency  Biweekly    OT Duration  8 weeks    OT Treatment/Interventions  Patient/family education;Splinting;Fluidtherapy;Contrast Bath;Passive range of motion;Therapeutic exercise    Plan  son and pt to phone if need to be seen again - wants to do HEP on own     Clinical Decision Making  Multiple treatment options, significant modification of task necessary    OT Home Exercise Plan  see pt instruction        Patient will benefit from skilled therapeutic intervention in  order to improve the following deficits and impairments:  Impaired flexibility, Pain, Decreased strength, Impaired UE functional use, Decreased range of motion  Visit Diagnosis: Pain in left wrist - Plan: Ot plan of care cert/re-cert  Stiffness of left wrist, not elsewhere classified - Plan: Ot plan of care cert/re-cert  Muscle weakness (generalized) - Plan: Ot plan of care cert/re-cert    Problem List Patient Active Problem List   Diagnosis Date Noted  . 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 Atrial fibrillation (HCC)   . Chronic systolic heart failure (HCC)   . Osteoporosis, post-menopausal   . Osteoarthritis, multiple sites     Oletta Cohn 02/20/2018, 10:47 PM  [facility] [address] [place], [address] Phone: [phone]   Fax:  [phone]  Name: Diana Powell MRN: [id number] Date of Birth: 1926-10-25

## 2018-02-20 NOTE — Patient Instructions (Signed)
Contrast   and then pain free AROM and AAROM for  Sup /pro   flexion and extention over armrest  And RD, UD with palms together   10 reps  2-3 x day   Splint on and off 2 hrs for few days  And then can try 3 hrs off and 2 hrs on  And then few days 4 off and 2 hrs on   Rest hand in pain free position - not in flexion or supination  And use in pain free range or less than 2/1o pain

## 2018-03-07 ENCOUNTER — Ambulatory Visit: Payer: Medicare Other | Admitting: Occupational Therapy

## 2018-03-07 DIAGNOSIS — M25632 Stiffness of left wrist, not elsewhere classified: Secondary | ICD-10-CM

## 2018-03-07 DIAGNOSIS — M25532 Pain in left wrist: Secondary | ICD-10-CM | POA: Diagnosis not present

## 2018-03-07 DIAGNOSIS — M6281 Muscle weakness (generalized): Secondary | ICD-10-CM

## 2018-03-07 NOTE — Therapy (Signed)
[facility] [address], [place], [address] Phone: [phone]   Fax:  [phone]  Occupational Therapy Treatment  Patient Details  Name: Diana Powell MRN: [id number] Date of Birth: 08-15-1926 Referring Provider: Floyce Stakes    Encounter Date: 03/07/2018  OT End of Session - 03/07/18 1906    Visit Number  2    Number of Visits  4    Date for OT Re-Evaluation  04/24/18    OT Start Time  1355    OT Stop Time  1434    OT Time Calculation (min)  39 min    Activity Tolerance  Patient tolerated treatment well;Patient limited by pain    Behavior During Therapy  [facility] for tasks assessed/performed       Past Medical History:  Diagnosis Date  . Atrial fibrillation (HCC)   . Chronic systolic heart failure (HCC)   . Osteoarthritis, multiple sites   . Osteoporosis, post-menopausal     Past Surgical History:  Procedure Laterality Date  . CARDIOVERSION  ~2009  . HIP FRACTURE SURGERY Bilateral [phone]   each hip fractured at separate times    There were no vitals filed for this visit.  Subjective Assessment - 03/07/18 1900    Subjective   Report motion is little better and pain with that - but still have this random times of catching pain with random movement - son translate for pt     Patient Stated Goals  Per son - they want some exercises they can do at home for L wrist     Currently in Pain?  Yes    Pain Score  --   no number stated    Pain Location  Wrist    Pain Orientation  Left    Pain Type  Acute pain    Pain Onset  More than a month ago    Pain Frequency  Intermittent         [facility] OT Assessment - 03/07/18 0001      AROM   Left Wrist Extension  50 Degrees    Left Wrist Flexion  45 Degrees    Left Wrist Radial Deviation  15 Degrees    Left Wrist Ulnar Deviation  25 Degrees      Assess AROM upon arrival - pain appear to be better and also reported by pt and son that it is better - but  still happened randomly - and not always same movement - sharp pain  - grabbing   Add this date  PROM for wrist extention - prayer stretch towards her And PROM for wrist flexion gentle over  Armrest and then AAROM for wrist extention  10 reps Was able to do and with no increase pain   Then pt to do same AROM and AAROM she was doing at home    done in clinic isometric for wrist flexion, ext, RD, UD neutral position  pain free 10 reps  pt and son ed on -  Light blue putty for grip 12 reps  1-2 x day same as isometric    Son and pt  can increase every 4 days  Sets  for strengthening exercises - in 4 days 2 sets and then 8-9 days  to 3 sets if no increase pain                       OT Education - 03/07/18 1905    Education  Details  upgrade HEP - isometric , PROM and putty     Person(s) Educated  Patient;Child(ren)    Methods  Explanation;Demonstration;Handout    Comprehension  Verbalized understanding;Returned demonstration       OT Short Term Goals - 02/20/18 2241      OT SHORT TERM GOAL #1   Title  Pt and son to be ind in HEP to increase AROM in L wrist without increase pain     Baseline  not knowledge - ed today on some AROM and AAROM of L wrist     Time  4    Period  Weeks    Status  New    Target Date  03/20/18        OT Long Term Goals - 02/20/18 2242      OT LONG TERM GOAL #1   Title  Pt AROM in L wrist and strength increase for pt to show increase ext by 20 degrees , and ext , rd, UD by 5-10 degrees to use hand in 50% of ADL's     Baseline  see flowsheet     Time  8    Period  Weeks    Status  New    Target Date  04/17/18            Plan - 03/07/18 1907    Clinical Impression Statement  Pt now about 10 wks out from L distal radius fx - but she had some radial shortening and dorsal displacement - xray showed some arthritis - pt cont to show krepetis and some pain but it is no consistant with same movement - can happend random - pt pain  did decrease since last seen  per pt and son and seen by this OT in clinic - showed increase AROM in wrist - did add this date PROM and isometric strenghtening - and very light putty for grip - son and pt ed again on HEP and how to advance - and will phone when they are ready for upgrade     Occupational performance deficits (Please refer to evaluation for details):  ADL's;IADL's;Leisure;Play    Rehab Potential  Fair    Current Impairments/barriers affecting progress:  pain , non operative and healed dorsally displace and radial shortening- krepitis at wrist during AROM assessment     OT Frequency  1x / week    OT Duration  6 weeks    OT Treatment/Interventions  Patient/family education;Splinting;Fluidtherapy;Contrast Bath;Passive range of motion;Therapeutic exercise    Plan  son and pt to phone if need to upgrade HEP- wants to do HEP on own     Clinical Decision Making  Multiple treatment options, significant modification of task necessary    OT Home Exercise Plan  see pt instruction     Consulted and Agree with Plan of Care  Patient       Patient will benefit from skilled therapeutic intervention in order to improve the following deficits and impairments:  Impaired flexibility, Pain, Decreased strength, Impaired UE functional use, Decreased range of motion  Visit Diagnosis: Pain in left wrist  Stiffness of left wrist, not elsewhere classified  Muscle weakness (generalized)    Problem List Patient Active Problem List   Diagnosis Date Noted  . 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 Atrial fibrillation (HCC)   . Chronic systolic heart failure (HCC)   . Osteoporosis, post-menopausal   . Osteoarthritis, multiple sites     Oletta CohnuPreez, Johneisha Broaden OTR/L,CLT  03/07/2018, 7:13 PM  [facility] [address] [place], [place], [address] Phone: [phone]   Fax:  [phone]  Name: Diana HotterGalina  Powell MRN: [id number] Date of Birth: December 18, 1926

## 2018-03-07 NOTE — Patient Instructions (Signed)
Cont with contrast Add PROM for wrist extention - prayer stretch towards her And PROM for wrist flexion gentle over  Armrest and then AAROM for wrist extention  10 reps  followed by same AROM for wrist and AAROM done since last time Add isometric for wrist flexion, ext, RD, UD neutral position  pain free 10 reps Light blue putty for grip 12 reps  1-2 x day  And can increase every 4 days reps for strengthening exercises - to 3 sets in 10 days if no increase pain

## 2018-03-30 ENCOUNTER — Ambulatory Visit: Payer: Medicare Other | Attending: Orthopedic Surgery | Admitting: Occupational Therapy

## 2018-03-30 DIAGNOSIS — M25532 Pain in left wrist: Secondary | ICD-10-CM

## 2018-03-30 DIAGNOSIS — M25632 Stiffness of left wrist, not elsewhere classified: Secondary | ICD-10-CM | POA: Diagnosis present

## 2018-03-30 DIAGNOSIS — M6281 Muscle weakness (generalized): Secondary | ICD-10-CM

## 2018-03-30 NOTE — Patient Instructions (Signed)
Pt to use heat or ice - what ever feels better  And then cont with AROM for wrist ext and flexion , RD, UD  Then 1 lbs for wrist in all planes  sup and pronation supported   10 reps - 2 x day try unsupported in few days RD, UD over armrest - 10 reps  2 x day  Increase to 2 sets in few days And wrist ext, flexion horizontal over armrest  10 reps  2 x day  Can try over armrest - up and down in few days  2 x day   Pt to do 3 point pinch on lap tray light blue putty  12 reps  And then gripping teal putty 12 reps   can increase to 2 sets in few days - until surgery

## 2018-03-30 NOTE — Therapy (Signed)
[place] [facility] [address] [place], [place], [address] Phone: [phone]   Fax:  [phone]  Occupational Therapy Treatment  Patient Details  Name: Genia HotterGalina Pong MRN: [id number] Date of Birth: 1926/11/29 Referring Provider: Floyce StakesGaines    Encounter Date: 03/30/2018  OT End of Session - 03/30/18 2056    Visit Number  3    Number of Visits  4    Date for OT Re-Evaluation  04/24/18    OT Start Time  1500    OT Stop Time  1544    OT Time Calculation (min)  44 min    Activity Tolerance  Patient tolerated treatment well;Patient limited by pain    Behavior During Therapy  [facility] for tasks assessed/performed       Past Medical History:  Diagnosis Date  . Atrial fibrillation (HCC)   . Chronic systolic heart failure (HCC)   . Osteoarthritis, multiple sites   . Osteoporosis, post-menopausal     Past Surgical History:  Procedure Laterality Date  . CARDIOVERSION  ~2009  . HIP FRACTURE SURGERY Bilateral [phone]   each hip fractured at separate times    There were no vitals filed for this visit.  Subjective Assessment - 03/30/18 2051    Subjective   Son translate for pt - doing better - using it more but still have at times with certain movement sharp or shooting pain but then it disappear- pt using hand more per son and pt - here for strengthening - schedule for hip replacement in early Oct     Patient Stated Goals  Per son - they want some exercises they can do at home for L wrist     Currently in Pain?  No/denies         [facility] - 03/30/18 0001      AROM   Left Wrist Extension  64 Degrees    Left Wrist Flexion  50 Degrees    Left Wrist Radial Deviation  12 Degrees    Left Wrist Ulnar Deviation  25 Degrees      Strength   Right Hand Grip (lbs)  44    Right Hand Lateral Pinch  9 lbs    Right Hand 3 Point Pinch  13 lbs    Left Hand Grip (lbs)  21    Left Hand Lateral Pinch  8 lbs    Left Hand 3  Point Pinch  4 lbs        Measurements taken -see flowsheet   great progress - cont to have pt with some range of motion and use of L hand and wrist  Pain can go from 0/10 to 5/10   cone HEP upgrade with pt  After assess isometric strength for wrist in all planes     Pt to use heat or ice - what ever feels better  And then cont with AROM for wrist ext and flexion , RD, UD  Then 1 lbs for wrist in all planes  sup and pronation supported   10 reps - 2 x day try unsupported in few days RD, UD over armrest - 10 reps  2 x day  Increase to 2 sets in few days And wrist ext, flexion horizontal over armrest  10 reps  2 x day  Can try over armrest - up and down in few days  2 x day   Pt to do 3 point pinch on lap tray light blue putty  12 reps  And then gripping teal putty 12 reps   can increase to 2 sets in few days - until surgery             OT Education - 03/30/18 2056    Education Details  upgrade HEP     Person(s) Educated  Patient;Child(ren)    Methods  Explanation;Demonstration;Handout    Comprehension  Verbalized understanding;Returned demonstration       OT Short Term Goals - 03/30/18 2100      OT SHORT TERM GOAL #1   Title  Pt and son to be ind in HEP to increase AROM in L wrist without increase pain     Status  Achieved        OT Long Term Goals - 03/30/18 2100      OT LONG TERM GOAL #1   Title  Pt AROM in L wrist and strength increase for pt to show increase ext by 20 degrees , and ext , rd, UD by 5-10 degrees to use hand in 50% of ADL's     Status  Achieved            Plan - 03/30/18 2056    Clinical Impression Statement  Pt now over 12 wks our from distal radius fx - pt has dorsal displacement and arthritis in DRUJ and CMC - pt still cont to have pain with random movement and then it is better - did observe pt pushing up from chair , taking weight thru hand and using it more - pt HEP ugprade to 1 lbs in all planes and putty upgrade for  gripping - pt to cont with HEP until  surgery that per son is schedule for THR early Oct - can contact me as needed     Occupational performance deficits (Please refer to evaluation for details):  ADL's;IADL's;Leisure;Play    Rehab Potential  Fair    Current Impairments/barriers affecting progress:  pain , non operative and healed dorsally displace and radial shortening- krepitis at wrist during AROM assessment     OT Frequency  Biweekly    OT Duration  2 weeks    OT Treatment/Interventions  Patient/family education;Splinting;Fluidtherapy;Contrast Bath;Passive range of motion;Therapeutic exercise    Plan  son and pt to phone if need to upgrade HEP- wants to do HEP on own - schedule for [facility]    Clinical Decision Making  Several treatment options, min-mod task modification necessary    OT Home Exercise Plan  see pt instruction     Consulted and Agree with Plan of Care  Patient       Patient will benefit from skilled therapeutic intervention in order to improve the following deficits and impairments:  Impaired flexibility, Pain, Decreased strength, Impaired UE functional use, Decreased range of motion  Visit Diagnosis: Pain in left wrist  Stiffness of left wrist, not elsewhere classified  Muscle weakness (generalized)    Problem List Patient Active Problem List   Diagnosis Date Noted  . 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 Atrial fibrillation (HCC)   . Chronic systolic heart failure (HCC)   . Osteoporosis, post-menopausal   . Osteoarthritis, multiple sites     Oletta Cohn  OTR/L,CLT 03/30/2018, 9:02 PM   [facility] [address] [place], [address] Phone: [phone]   Fax:  [phone]  Name: Annamaria Salah MRN: [id number] Date of Birth: 03-20-1927

## 2018-04-11 ENCOUNTER — Other Ambulatory Visit: Payer: Self-pay

## 2018-04-11 ENCOUNTER — Encounter
Admission: RE | Admit: 2018-04-11 | Discharge: 2018-04-11 | Disposition: A | Discharge status: Home / Self Care | Payer: Medicare Other | Source: Ambulatory Visit | Attending: Orthopedic Surgery | Admitting: Orthopedic Surgery

## 2018-04-11 DIAGNOSIS — Z01818 Encounter for other preprocedural examination: Secondary | ICD-10-CM | POA: Insufficient documentation

## 2018-04-11 DIAGNOSIS — Z950 Presence of cardiac pacemaker: Secondary | ICD-10-CM | POA: Insufficient documentation

## 2018-04-11 DIAGNOSIS — I4891 Unspecified atrial fibrillation: Secondary | ICD-10-CM | POA: Insufficient documentation

## 2018-04-11 HISTORY — DX: Chronic obstructive pulmonary disease, unspecified: J44.9

## 2018-04-11 HISTORY — DX: Presence of cardiac pacemaker: Z95.0

## 2018-04-11 LAB — PROTIME-INR
INR: 1.59
Prothrombin Time: 18.8 seconds — ABNORMAL HIGH (ref 11.4–15.2)

## 2018-04-11 LAB — URINALYSIS, ROUTINE W REFLEX MICROSCOPIC
Bacteria, UA: NONE SEEN
Bilirubin Urine: NEGATIVE
Glucose, UA: NEGATIVE mg/dL
Ketones, ur: NEGATIVE mg/dL
Leukocytes, UA: NEGATIVE
Nitrite: NEGATIVE
PROTEIN: NEGATIVE mg/dL
Specific Gravity, Urine: 1.008 (ref 1.005–1.030)
Squamous Epithelial / LPF: NONE SEEN (ref 0–5)
WBC UA: NONE SEEN WBC/hpf (ref 0–5)
pH: 5 (ref 5.0–8.0)

## 2018-04-11 LAB — CBC
HCT: 29.5 % — ABNORMAL LOW (ref 35.0–47.0)
Hemoglobin: 9.2 g/dL — ABNORMAL LOW (ref 12.0–16.0)
MCH: 22.5 pg — ABNORMAL LOW (ref 26.0–34.0)
MCHC: 31.2 g/dL — AB (ref 32.0–36.0)
MCV: 72.2 fL — ABNORMAL LOW (ref 80.0–100.0)
PLATELETS: 250 10*3/uL (ref 150–440)
RBC: 4.09 MIL/uL (ref 3.80–5.20)
RDW: 17.7 % — AB (ref 11.5–14.5)
WBC: 5.7 10*3/uL (ref 3.6–11.0)

## 2018-04-11 LAB — SURGICAL PCR SCREEN
MRSA, PCR: NEGATIVE
STAPHYLOCOCCUS AUREUS: NEGATIVE

## 2018-04-11 LAB — BASIC METABOLIC PANEL
Anion gap: 8 (ref 5–15)
BUN: 17 mg/dL (ref 8–23)
CALCIUM: 8.9 mg/dL (ref 8.9–10.3)
CO2: 31 mmol/L (ref 22–32)
Chloride: 103 mmol/L (ref 98–111)
Creatinine, Ser: 1.11 mg/dL — ABNORMAL HIGH (ref 0.44–1.00)
GFR calc Af Amer: 50 mL/min — ABNORMAL LOW (ref >60)
GFR, EST NON AFRICAN AMERICAN: 43 mL/min — AB (ref >60)
GLUCOSE: 121 mg/dL — AB (ref 70–99)
POTASSIUM: 3.7 mmol/L (ref 3.5–5.1)
Sodium: 142 mmol/L (ref 135–145)

## 2018-04-11 LAB — APTT: aPTT: 27 seconds (ref 24–36)

## 2018-04-11 LAB — SEDIMENTATION RATE: SED RATE: 37 mm/h — AB (ref 0–30)

## 2018-04-11 NOTE — Pre-Procedure Instructions (Signed)
[place]ian Interpreter present throughout interview.  Son Trinna Postlex present and speaks and reads English.  AVS in English sent home with son.

## 2018-04-11 NOTE — Pre-Procedure Instructions (Signed)
Faxed Pacemaker form and cardiac clearance request to Dr. Hassell HalimEric Holland's Office.  Called office and talked to MarshallDenise. Faxed an FYI to Dr. Neomia GlassMenz's office about cardiac clearance.  Medical Clearance per Dr. Cora DanielsBlomberg on chart.

## 2018-04-11 NOTE — Patient Instructions (Signed)
Your procedure is scheduled on: Thurs. 10/3 Report to Day Surgery. To find out your arrival time please call [phone] between 1PM - 3PM on Wed. 10/2.  Remember: Instructions that are not followed completely may result in serious medical risk,  up to and including death, or upon the discretion of your surgeon and anesthesiologist your  surgery may need to be rescheduled.     _X__ 1. Do not eat food after midnight the night before your procedure.                 No gum chewing or hard candies. You may drink clear liquids up to 2 hours                 before you are scheduled to arrive for your surgery- DO not drink clear                 liquids within 2 hours of the start of your surgery.                 Clear Liquids include:  water, apple juice without pulp, clear carbohydrate                 drink such as Clearfast of Gatorade, Black Coffee or Tea (Do not add                 anything to coffee or tea).  __X__2.  On the morning of surgery brush your teeth with toothpaste and water, you                may rinse your mouth with mouthwash if you wish.  Do not swallow any toothpaste of mouthwash.     _X__ 3.  No Alcohol for 24 hours before or after surgery.   ___ 4.  Do Not Smoke or use e-cigarettes For 24 Hours Prior to Your Surgery.                 Do not use any chewable tobacco products for at least 6 hours prior to                 surgery.  ____  5.  Bring all medications with you on the day of surgery if instructed.   __x__  6.  Notify your doctor if there is any change in your medical condition      (cold, fever, infections).     Do not wear jewelry, make-up, hairpins, clips or nail polish. Do not wear lotions, powders, or perfumes. You may wear deodorant. Do not shave 48 hours prior to surgery. Men may shave face and neck. Do not bring valuables to the hospital.    [facility] is not responsible for any belongings or valuables.  Contacts, dentures  or bridgework may not be worn into surgery. Leave your suitcase in the car. After surgery it may be brought to your room. For patients admitted to the hospital, discharge time is determined by your treatment team.   Patients discharged the day of surgery will not be allowed to drive home.   Please read over the following fact sheets that you were given:   MRSA Information   __x__ Take these medicines the morning of surgery with A SIP OF WATER:    1. amLODipine (NORVASC) 5 MG tablet  2. traMADol (ULTRAM) 50 MG tablet if needed  3.   4.  5.  6.  ____ Fleet Enema (as directed)   _x___  Use CHG Soap as directed  __x__ Use inhalers on the day of surgerybudesonide (PULMICORT) 0.5 MG/2ML nebulizer solution,formoterol (PERFOROMIST) 20 MCG/2ML nebulizer solution  ____ Stop metformin 2 days prior to surgery    ____ Take 1/2 of usual insulin dose the night before surgery. No insulin the morning          of surgery.   __x__ Stop on XARELTO 15 MG TABS tablet Last dose on Sunday 9/29  To resume 4 days after surgery  ____ Stop Anti-inflammatories on    ____ Stop supplements until after surgery.    ____ Bring C-Pap to the hospital.

## 2018-04-11 NOTE — Pre-Procedure Instructions (Signed)
Pulmonology clearance on chart.

## 2018-04-12 NOTE — Pre-Procedure Instructions (Signed)
Laboratory results sent to Dr. Rosita KeaMenz and Anesthesia for review.

## 2018-04-13 LAB — URINE CULTURE
Culture: NO GROWTH
Special Requests: NORMAL

## 2018-04-19 MED ORDER — CEFAZOLIN SODIUM-DEXTROSE 2-4 GM/100ML-% IV SOLN
2.0000 g | Freq: Once | INTRAVENOUS | Status: AC
  Administered 2018-04-20: 2 g via INTRAVENOUS

## 2018-04-20 ENCOUNTER — Encounter
Admission: RE | Disposition: A | Discharge status: Home Health | Payer: Self-pay | Source: Home / Self Care | Attending: Orthopedic Surgery

## 2018-04-20 ENCOUNTER — Inpatient Hospital Stay: Payer: Medicare Other

## 2018-04-20 ENCOUNTER — Inpatient Hospital Stay
Admission: RE | Admit: 2018-04-20 | Discharge: 2018-04-26 | DRG: 469 | Disposition: A | Discharge status: Home Health | Payer: Medicare Other | Attending: Orthopedic Surgery | Admitting: Orthopedic Surgery

## 2018-04-20 ENCOUNTER — Encounter: Payer: Self-pay | Admitting: Anesthesiology

## 2018-04-20 ENCOUNTER — Other Ambulatory Visit: Payer: Self-pay

## 2018-04-20 ENCOUNTER — Inpatient Hospital Stay: Payer: Medicare Other | Admitting: Registered Nurse

## 2018-04-20 DIAGNOSIS — Z7901 Long term (current) use of anticoagulants: Secondary | ICD-10-CM | POA: Diagnosis not present

## 2018-04-20 DIAGNOSIS — F0390 Unspecified dementia without behavioral disturbance: Secondary | ICD-10-CM | POA: Diagnosis present

## 2018-04-20 DIAGNOSIS — Z79899 Other long term (current) drug therapy: Secondary | ICD-10-CM | POA: Diagnosis not present

## 2018-04-20 DIAGNOSIS — M25571 Pain in right ankle and joints of right foot: Secondary | ICD-10-CM | POA: Diagnosis not present

## 2018-04-20 DIAGNOSIS — Z950 Presence of cardiac pacemaker: Secondary | ICD-10-CM

## 2018-04-20 DIAGNOSIS — Y792 Prosthetic and other implants, materials and accessory orthopedic devices associated with adverse incidents: Secondary | ICD-10-CM | POA: Diagnosis present

## 2018-04-20 DIAGNOSIS — J449 Chronic obstructive pulmonary disease, unspecified: Secondary | ICD-10-CM | POA: Diagnosis present

## 2018-04-20 DIAGNOSIS — R05 Cough: Secondary | ICD-10-CM

## 2018-04-20 DIAGNOSIS — T84114A Breakdown (mechanical) of internal fixation device of right femur, initial encounter: Secondary | ICD-10-CM | POA: Diagnosis present

## 2018-04-20 DIAGNOSIS — Z7951 Long term (current) use of inhaled steroids: Secondary | ICD-10-CM | POA: Diagnosis not present

## 2018-04-20 DIAGNOSIS — D62 Acute posthemorrhagic anemia: Secondary | ICD-10-CM | POA: Diagnosis not present

## 2018-04-20 DIAGNOSIS — R059 Cough, unspecified: Secondary | ICD-10-CM

## 2018-04-20 DIAGNOSIS — Z419 Encounter for procedure for purposes other than remedying health state, unspecified: Secondary | ICD-10-CM

## 2018-04-20 DIAGNOSIS — M1651 Unilateral post-traumatic osteoarthritis, right hip: Secondary | ICD-10-CM | POA: Diagnosis present

## 2018-04-20 DIAGNOSIS — I4891 Unspecified atrial fibrillation: Secondary | ICD-10-CM | POA: Diagnosis present

## 2018-04-20 DIAGNOSIS — I959 Hypotension, unspecified: Secondary | ICD-10-CM | POA: Diagnosis not present

## 2018-04-20 DIAGNOSIS — Z96641 Presence of right artificial hip joint: Secondary | ICD-10-CM

## 2018-04-20 DIAGNOSIS — I110 Hypertensive heart disease with heart failure: Secondary | ICD-10-CM | POA: Diagnosis present

## 2018-04-20 DIAGNOSIS — G8918 Other acute postprocedural pain: Secondary | ICD-10-CM

## 2018-04-20 DIAGNOSIS — I5023 Acute on chronic systolic (congestive) heart failure: Secondary | ICD-10-CM | POA: Diagnosis not present

## 2018-04-20 DIAGNOSIS — T84010A Broken internal right hip prosthesis, initial encounter: Secondary | ICD-10-CM | POA: Diagnosis present

## 2018-04-20 DIAGNOSIS — M810 Age-related osteoporosis without current pathological fracture: Secondary | ICD-10-CM | POA: Diagnosis present

## 2018-04-20 DIAGNOSIS — Y92009 Unspecified place in unspecified non-institutional (private) residence as the place of occurrence of the external cause: Secondary | ICD-10-CM | POA: Diagnosis not present

## 2018-04-20 HISTORY — PX: TOTAL HIP ARTHROPLASTY: SHX124

## 2018-04-20 LAB — PROTIME-INR
INR: 1.14
Prothrombin Time: 14.5 seconds (ref 11.4–15.2)

## 2018-04-20 LAB — ABO/RH: ABO/RH(D): A POS

## 2018-04-20 SURGERY — ARTHROPLASTY, HIP, TOTAL, ANTERIOR APPROACH
Anesthesia: Spinal | Site: Hip | Laterality: Right

## 2018-04-20 MED ORDER — RIVAROXABAN 15 MG PO TABS
15.0000 mg | ORAL_TABLET | Freq: Every day | ORAL | Status: DC
  Administered 2018-04-21 – 2018-04-23 (×3): 15 mg via ORAL
  Filled 2018-04-20 (×4): qty 1

## 2018-04-20 MED ORDER — DIPHENHYDRAMINE HCL 12.5 MG/5ML PO ELIX: 12.5000 mg | ORAL_SOLUTION | ORAL | Status: DC | PRN

## 2018-04-20 MED ORDER — SODIUM CHLORIDE 0.9 % IV SOLN
INTRAVENOUS | Status: DC | PRN
  Administered 2018-04-20: 13:00:00 via INTRAVENOUS

## 2018-04-20 MED ORDER — ZOLPIDEM TARTRATE 5 MG PO TABS
5.0000 mg | ORAL_TABLET | Freq: Every evening | ORAL | Status: DC | PRN
  Administered 2018-04-24 – 2018-04-25 (×2): 5 mg via ORAL
  Filled 2018-04-20 (×3): qty 1

## 2018-04-20 MED ORDER — FE FUMARATE-B12-VIT C-FA-IFC PO CAPS
1.0000 | ORAL_CAPSULE | Freq: Three times a day (TID) | ORAL | Status: DC
  Administered 2018-04-21 – 2018-04-26 (×16): 1 via ORAL
  Filled 2018-04-20 (×19): qty 1

## 2018-04-20 MED ORDER — PHENOL 1.4 % MT LIQD
1.0000 | OROMUCOSAL | Status: DC | PRN
  Filled 2018-04-20: qty 177

## 2018-04-20 MED ORDER — VITAMIN B-12 1000 MCG PO TABS
1000.0000 ug | ORAL_TABLET | Freq: Every day | ORAL | Status: DC
  Administered 2018-04-21 – 2018-04-26 (×6): 1000 ug via ORAL
  Filled 2018-04-20 (×6): qty 1

## 2018-04-20 MED ORDER — BUPIVACAINE-EPINEPHRINE (PF) 0.25% -1:200000 IJ SOLN
INTRAMUSCULAR | Status: AC
  Filled 2018-04-20: qty 30

## 2018-04-20 MED ORDER — EPINEPHRINE PF 1 MG/ML IJ SOLN
INTRAMUSCULAR | Status: AC
  Filled 2018-04-20: qty 1

## 2018-04-20 MED ORDER — MENTHOL 3 MG MT LOZG
1.0000 | LOZENGE | OROMUCOSAL | Status: DC | PRN
  Filled 2018-04-20: qty 9

## 2018-04-20 MED ORDER — MAGNESIUM CITRATE PO SOLN
1.0000 | Freq: Once | ORAL | Status: DC | PRN
  Filled 2018-04-20: qty 296

## 2018-04-20 MED ORDER — METOCLOPRAMIDE HCL 5 MG/ML IJ SOLN: 5.0000 mg | Freq: Three times a day (TID) | INTRAMUSCULAR | Status: DC | PRN

## 2018-04-20 MED ORDER — FENTANYL CITRATE (PF) 100 MCG/2ML IJ SOLN
INTRAMUSCULAR | Status: DC | PRN
  Administered 2018-04-20 (×2): 25 ug via INTRAVENOUS

## 2018-04-20 MED ORDER — BUPIVACAINE HCL (PF) 0.5 % IJ SOLN
INTRAMUSCULAR | Status: DC | PRN
  Administered 2018-04-20: 3 mL via INTRATHECAL

## 2018-04-20 MED ORDER — ONDANSETRON HCL 4 MG/2ML IJ SOLN
INTRAMUSCULAR | Status: AC
  Filled 2018-04-20: qty 2

## 2018-04-20 MED ORDER — ONDANSETRON HCL 4 MG/2ML IJ SOLN
4.0000 mg | Freq: Four times a day (QID) | INTRAMUSCULAR | Status: DC | PRN
  Administered 2018-04-21: 4 mg via INTRAVENOUS
  Filled 2018-04-20: qty 2

## 2018-04-20 MED ORDER — AMLODIPINE BESYLATE 5 MG PO TABS
5.0000 mg | ORAL_TABLET | Freq: Every day | ORAL | Status: DC
  Administered 2018-04-23 – 2018-04-25 (×3): 5 mg via ORAL
  Filled 2018-04-20 (×3): qty 1

## 2018-04-20 MED ORDER — PROPOFOL 500 MG/50ML IV EMUL
INTRAVENOUS | Status: AC
  Filled 2018-04-20: qty 50

## 2018-04-20 MED ORDER — BISACODYL 5 MG PO TBEC
5.0000 mg | DELAYED_RELEASE_TABLET | Freq: Every day | ORAL | Status: DC | PRN
  Administered 2018-04-22 – 2018-04-26 (×2): 5 mg via ORAL
  Filled 2018-04-20 (×2): qty 1

## 2018-04-20 MED ORDER — DOCUSATE SODIUM 100 MG PO CAPS
100.0000 mg | ORAL_CAPSULE | Freq: Two times a day (BID) | ORAL | Status: DC
  Administered 2018-04-21 – 2018-04-26 (×9): 100 mg via ORAL
  Filled 2018-04-20 (×9): qty 1

## 2018-04-20 MED ORDER — POTASSIUM CHLORIDE CRYS ER 10 MEQ PO TBCR
10.0000 meq | EXTENDED_RELEASE_TABLET | Freq: Every day | ORAL | Status: DC
  Administered 2018-04-21 – 2018-04-26 (×6): 10 meq via ORAL
  Filled 2018-04-20 (×6): qty 1

## 2018-04-20 MED ORDER — TRAMADOL HCL 50 MG PO TABS
50.0000 mg | ORAL_TABLET | Freq: Four times a day (QID) | ORAL | Status: DC
  Administered 2018-04-20 – 2018-04-26 (×12): 50 mg via ORAL
  Filled 2018-04-20 (×15): qty 1

## 2018-04-20 MED ORDER — BUDESONIDE 0.5 MG/2ML IN SUSP
0.5000 mg | Freq: Every day | RESPIRATORY_TRACT | Status: DC
  Administered 2018-04-21 – 2018-04-26 (×6): 0.5 mg via RESPIRATORY_TRACT
  Filled 2018-04-20 (×7): qty 2

## 2018-04-20 MED ORDER — BUPIVACAINE-EPINEPHRINE 0.25% -1:200000 IJ SOLN
INTRAMUSCULAR | Status: DC | PRN
  Administered 2018-04-20: 30 mL

## 2018-04-20 MED ORDER — EPHEDRINE SULFATE 50 MG/ML IJ SOLN
INTRAMUSCULAR | Status: DC | PRN
  Administered 2018-04-20: 10 mg via INTRAVENOUS
  Administered 2018-04-20 (×3): 5 mg via INTRAVENOUS

## 2018-04-20 MED ORDER — MORPHINE SULFATE (PF) 2 MG/ML IV SOLN
0.5000 mg | INTRAVENOUS | Status: DC | PRN
  Administered 2018-04-21: 0.5 mg via INTRAVENOUS
  Filled 2018-04-20: qty 1

## 2018-04-20 MED ORDER — PROPOFOL 10 MG/ML IV BOLUS
INTRAVENOUS | Status: DC | PRN
  Administered 2018-04-20 (×2): 25 mg via INTRAVENOUS

## 2018-04-20 MED ORDER — FAMOTIDINE 20 MG PO TABS
ORAL_TABLET | ORAL | Status: AC
  Filled 2018-04-20: qty 1

## 2018-04-20 MED ORDER — FUROSEMIDE 40 MG PO TABS
40.0000 mg | ORAL_TABLET | Freq: Every day | ORAL | Status: DC
  Administered 2018-04-21 – 2018-04-24 (×4): 40 mg via ORAL
  Filled 2018-04-20 (×4): qty 1

## 2018-04-20 MED ORDER — ACETAMINOPHEN 500 MG PO TABS
500.0000 mg | ORAL_TABLET | Freq: Four times a day (QID) | ORAL | Status: AC
  Administered 2018-04-20 – 2018-04-21 (×4): 500 mg via ORAL
  Filled 2018-04-20 (×4): qty 1

## 2018-04-20 MED ORDER — CEFAZOLIN SODIUM-DEXTROSE 2-4 GM/100ML-% IV SOLN
INTRAVENOUS | Status: AC
  Filled 2018-04-20: qty 100

## 2018-04-20 MED ORDER — EPINEPHRINE PF 1 MG/ML IJ SOLN
INTRAMUSCULAR | Status: DC | PRN
  Administered 2018-04-20: .0001 mL via INTRATHECAL

## 2018-04-20 MED ORDER — HYDROCODONE-ACETAMINOPHEN 5-325 MG PO TABS
1.0000 | ORAL_TABLET | ORAL | Status: DC | PRN
  Administered 2018-04-20 – 2018-04-23 (×5): 1 via ORAL
  Filled 2018-04-20 (×5): qty 1

## 2018-04-20 MED ORDER — CLOTRIMAZOLE 1 % EX CREA
1.0000 "application " | TOPICAL_CREAM | Freq: Every day | CUTANEOUS | Status: DC | PRN
  Filled 2018-04-20: qty 15

## 2018-04-20 MED ORDER — ALUM & MAG HYDROXIDE-SIMETH 200-200-20 MG/5ML PO SUSP: 30.0000 mL | ORAL | Status: DC | PRN

## 2018-04-20 MED ORDER — METHOCARBAMOL 1000 MG/10ML IJ SOLN
500.0000 mg | Freq: Four times a day (QID) | INTRAVENOUS | Status: DC | PRN
  Filled 2018-04-20 (×2): qty 5

## 2018-04-20 MED ORDER — FAMOTIDINE 20 MG PO TABS
20.0000 mg | ORAL_TABLET | Freq: Once | ORAL | Status: AC
  Administered 2018-04-20: 20 mg via ORAL

## 2018-04-20 MED ORDER — FENTANYL CITRATE (PF) 100 MCG/2ML IJ SOLN
INTRAMUSCULAR | Status: AC
  Filled 2018-04-20: qty 2

## 2018-04-20 MED ORDER — DEXMEDETOMIDINE HCL 200 MCG/2ML IV SOLN
INTRAVENOUS | Status: DC | PRN
  Administered 2018-04-20: 8 ug via INTRAVENOUS
  Administered 2018-04-20: 12 ug via INTRAVENOUS

## 2018-04-20 MED ORDER — LIDOCAINE HCL (PF) 2 % IJ SOLN
INTRAMUSCULAR | Status: AC
  Filled 2018-04-20: qty 10

## 2018-04-20 MED ORDER — LACTATED RINGERS IV SOLN
INTRAVENOUS | Status: DC
  Administered 2018-04-20: 09:00:00 via INTRAVENOUS

## 2018-04-20 MED ORDER — METHOCARBAMOL 500 MG PO TABS
500.0000 mg | ORAL_TABLET | Freq: Four times a day (QID) | ORAL | Status: DC | PRN
  Administered 2018-04-20 – 2018-04-24 (×4): 500 mg via ORAL
  Filled 2018-04-20 (×5): qty 1

## 2018-04-20 MED ORDER — PHENYLEPHRINE HCL 10 MG/ML IJ SOLN
INTRAMUSCULAR | Status: DC | PRN
  Administered 2018-04-20 (×2): 100 ug via INTRAVENOUS
  Administered 2018-04-20: 150 ug via INTRAVENOUS
  Administered 2018-04-20 (×2): 50 ug via INTRAVENOUS
  Administered 2018-04-20: 100 ug via INTRAVENOUS
  Administered 2018-04-20 (×2): 50 ug via INTRAVENOUS
  Administered 2018-04-20: 100 ug via INTRAVENOUS
  Administered 2018-04-20 (×3): 50 ug via INTRAVENOUS
  Administered 2018-04-20: 100 ug via INTRAVENOUS

## 2018-04-20 MED ORDER — CEFAZOLIN SODIUM-DEXTROSE 1-4 GM/50ML-% IV SOLN
1.0000 g | Freq: Four times a day (QID) | INTRAVENOUS | Status: AC
  Administered 2018-04-20 – 2018-04-21 (×3): 1 g via INTRAVENOUS
  Filled 2018-04-20 (×3): qty 50

## 2018-04-20 MED ORDER — HYDROCODONE-ACETAMINOPHEN 7.5-325 MG PO TABS
1.0000 | ORAL_TABLET | ORAL | Status: DC | PRN
  Administered 2018-04-20 – 2018-04-21 (×2): 1 via ORAL
  Filled 2018-04-20 (×2): qty 1

## 2018-04-20 MED ORDER — HEPARIN SODIUM (PORCINE) 10000 UNIT/ML IJ SOLN
INTRAMUSCULAR | Status: AC
  Filled 2018-04-20: qty 1

## 2018-04-20 MED ORDER — ONDANSETRON HCL 4 MG/2ML IJ SOLN
INTRAMUSCULAR | Status: DC | PRN
  Administered 2018-04-20: 4 mg via INTRAVENOUS

## 2018-04-20 MED ORDER — SENNOSIDES-DOCUSATE SODIUM 8.6-50 MG PO TABS: 1.0000 | ORAL_TABLET | Freq: Every evening | ORAL | Status: DC | PRN

## 2018-04-20 MED ORDER — GENTAMICIN SULFATE 40 MG/ML IJ SOLN
INTRAMUSCULAR | Status: AC
  Filled 2018-04-20: qty 8

## 2018-04-20 MED ORDER — PROPOFOL 500 MG/50ML IV EMUL
INTRAVENOUS | Status: DC | PRN
  Administered 2018-04-20: 40 ug/kg/min via INTRAVENOUS

## 2018-04-20 MED ORDER — CALCIUM CARBONATE-VITAMIN D 500-200 MG-UNIT PO TABS
1.0000 | ORAL_TABLET | Freq: Every day | ORAL | Status: DC
  Administered 2018-04-21 – 2018-04-26 (×6): 1 via ORAL
  Filled 2018-04-20 (×6): qty 1

## 2018-04-20 MED ORDER — METOCLOPRAMIDE HCL 10 MG PO TABS: 5.0000 mg | ORAL_TABLET | Freq: Three times a day (TID) | ORAL | Status: DC | PRN

## 2018-04-20 MED ORDER — ONDANSETRON HCL 4 MG PO TABS: 4.0000 mg | ORAL_TABLET | Freq: Four times a day (QID) | ORAL | Status: DC | PRN

## 2018-04-20 MED ORDER — EPHEDRINE SULFATE 50 MG/ML IJ SOLN
INTRAMUSCULAR | Status: AC
  Filled 2018-04-20: qty 1

## 2018-04-20 MED ORDER — ACETAMINOPHEN 325 MG PO TABS
325.0000 mg | ORAL_TABLET | Freq: Four times a day (QID) | ORAL | Status: DC | PRN
  Administered 2018-04-22 – 2018-04-23 (×2): 650 mg via ORAL
  Filled 2018-04-20 (×2): qty 2

## 2018-04-20 MED ORDER — SODIUM CHLORIDE 0.9 % IV SOLN
INTRAVENOUS | Status: DC | PRN
  Administered 2018-04-20: 1000 mL

## 2018-04-20 MED ORDER — BUPIVACAINE HCL (PF) 0.5 % IJ SOLN
INTRAMUSCULAR | Status: AC
  Filled 2018-04-20: qty 10

## 2018-04-20 MED ORDER — ARFORMOTEROL TARTRATE 15 MCG/2ML IN NEBU
15.0000 ug | INHALATION_SOLUTION | Freq: Two times a day (BID) | RESPIRATORY_TRACT | Status: DC
  Administered 2018-04-20 – 2018-04-26 (×10): 15 ug via RESPIRATORY_TRACT
  Filled 2018-04-20 (×13): qty 2

## 2018-04-20 SURGICAL SUPPLY — 63 items
BLADE SAGITTAL AGGR TOOTH XLG (BLADE) ×3 IMPLANT
BNDG COHESIVE 6X5 TAN STRL LF (GAUZE/BANDAGES/DRESSINGS) ×9 IMPLANT
CANISTER SUCT 1200ML W/VALVE (MISCELLANEOUS) ×3 IMPLANT
CELL SAVER AUTOCOAGULATE (MISCELLANEOUS) ×3
CELL SAVER COLL SVCS (MISCELLANEOUS) ×3
CELL SAVER LIPIGUARD (MISCELLANEOUS) ×3
CHLORAPREP W/TINT 26ML (MISCELLANEOUS) ×3 IMPLANT
DRAPE C-ARM XRAY 36X54 (DRAPES) ×3 IMPLANT
DRAPE INCISE IOBAN 66X60 STRL (DRAPES) IMPLANT
DRAPE POUCH INSTRU U-SHP 10X18 (DRAPES) ×3 IMPLANT
DRAPE SHEET LG 3/4 BI-LAMINATE (DRAPES) ×9 IMPLANT
DRAPE TABLE BACK 80X90 (DRAPES) ×3 IMPLANT
DRESSING SURGICEL FIBRLLR 1X2 (HEMOSTASIS) ×2 IMPLANT
DRSG OPSITE POSTOP 4X8 (GAUZE/BANDAGES/DRESSINGS) ×6 IMPLANT
DRSG SURGICEL FIBRILLAR 1X2 (HEMOSTASIS) ×6
ELECT BLADE 6.5 EXT (BLADE) ×3 IMPLANT
ELECT REM PT RETURN 9FT ADLT (ELECTROSURGICAL) ×3
ELECTRODE REM PT RTRN 9FT ADLT (ELECTROSURGICAL) ×1 IMPLANT
GLOVE BIOGEL PI IND STRL 9 (GLOVE) ×1 IMPLANT
GLOVE BIOGEL PI INDICATOR 9 (GLOVE) ×2
GLOVE SURG SYN 9.0  PF PI (GLOVE) ×4
GLOVE SURG SYN 9.0 PF PI (GLOVE) ×2 IMPLANT
GOWN SRG 2XL LVL 4 RGLN SLV (GOWNS) ×1 IMPLANT
GOWN STRL NON-REIN 2XL LVL4 (GOWNS) ×2
GOWN STRL REUS W/ TWL LRG LVL3 (GOWN DISPOSABLE) ×1 IMPLANT
GOWN STRL REUS W/TWL LRG LVL3 (GOWN DISPOSABLE) ×2
HEMOVAC 400CC 10FR (MISCELLANEOUS) IMPLANT
HIP FEM HD M 28 (Head) ×3 IMPLANT
HOLDER FOLEY CATH W/STRAP (MISCELLANEOUS) ×3 IMPLANT
HOOD PEEL AWAY FLYTE STAYCOOL (MISCELLANEOUS) ×3 IMPLANT
KIT PREVENA INCISION MGT 13 (CANNISTER) ×3 IMPLANT
LINER DBL MOB SZ 0 52MM (Liner) ×3 IMPLANT
LIPIGURD CELL SAVER (MISCELLANEOUS) ×1 IMPLANT
MAT ABSORB  FLUID 56X50 GRAY (MISCELLANEOUS) ×2
MAT ABSORB FLUID 56X50 GRAY (MISCELLANEOUS) ×1 IMPLANT
NDL SAFETY ECLIPSE 18X1.5 (NEEDLE) ×1 IMPLANT
NEEDLE HYPO 18GX1.5 SHARP (NEEDLE) ×2
NEEDLE SPNL 18GX3.5 QUINCKE PK (NEEDLE) ×3 IMPLANT
NS IRRIG 1000ML POUR BTL (IV SOLUTION) ×3 IMPLANT
PACK HIP COMPR (MISCELLANEOUS) ×3 IMPLANT
SAVE CELL AUTOCOAG (MISCELLANEOUS) ×1 IMPLANT
SAVER CELL COLL SVCS (MISCELLANEOUS) ×1 IMPLANT
SCALPEL PROTECTED #10 DISP (BLADE) ×6 IMPLANT
SHELL ACETABULAR SZ 52 DM (Shell) ×3 IMPLANT
SOL PREP PVP 2OZ (MISCELLANEOUS) ×3
SOLUTION PREP PVP 2OZ (MISCELLANEOUS) ×1 IMPLANT
SPONGE DRAIN TRACH 4X4 STRL 2S (GAUZE/BANDAGES/DRESSINGS) ×3 IMPLANT
STAPLER SKIN PROX 35W (STAPLE) ×3 IMPLANT
STEM STD FEM SZ 7SN (Stem) ×3 IMPLANT
STRAP SAFETY 5IN WIDE (MISCELLANEOUS) ×3 IMPLANT
SUT DVC 2 QUILL PDO  T11 36X36 (SUTURE) ×2
SUT DVC 2 QUILL PDO T11 36X36 (SUTURE) ×1 IMPLANT
SUT SILK 0 (SUTURE) ×2
SUT SILK 0 30XBRD TIE 6 (SUTURE) ×1 IMPLANT
SUT V-LOC 90 ABS DVC 3-0 CL (SUTURE) ×3 IMPLANT
SUT VIC AB 1 CT1 36 (SUTURE) ×3 IMPLANT
SYR 20CC LL (SYRINGE) ×3 IMPLANT
SYR 30ML LL (SYRINGE) ×3 IMPLANT
SYR BULB IRRIG 60ML STRL (SYRINGE) ×3 IMPLANT
TAPE MICROFOAM 4IN (TAPE) ×3 IMPLANT
TOWEL OR 17X26 4PK STRL BLUE (TOWEL DISPOSABLE) ×3 IMPLANT
TRAY FOLEY MTR SLVR 16FR STAT (SET/KITS/TRAYS/PACK) ×3 IMPLANT
WND VAC CANISTER 500ML (MISCELLANEOUS) ×3 IMPLANT

## 2018-04-20 NOTE — NC FL2 (Signed)
  [place] MEDICAID FL2 LEVEL OF CARE SCREENING TOOL     IDENTIFICATION  Patient Name: Shamiyah Ngu Birthdate: [birth year] Sex: female Admission Date (Current Location): 04/20/2018  [facility] and [place] Number:  [address] ([id number] [facility]) Facility and Address:  [facility], [address] [place], [place] [address]      Provider Number: [id number]  Attending Physician Name and Address:  Kennedy Bucker, MD  Relative Name and Phone Number:       Current Level of Care: Hospital Recommended Level of Care: Skilled Nursing Facility Prior Approval Number:    Date Approved/Denied:   PASRR Number: [id number])  Discharge Plan: SNF    Current Diagnoses: Patient Active Problem List   Diagnosis Date Noted  . 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 Atrial fibrillation (HCC)   . Chronic systolic heart failure (HCC)   . Osteoporosis, post-menopausal   . Osteoarthritis, multiple sites     Orientation RESPIRATION BLADDER Height & Weight     Self, Time, Situation, Place  O2(2 Liters Oxygen. ) Continent Weight: 126 lb 5 oz (57.3 kg) Height:  5' (152.4 cm)  BEHAVIORAL SYMPTOMS/MOOD NEUROLOGICAL BOWEL NUTRITION STATUS      Continent Diet(Diet: Regular )  AMBULATORY STATUS COMMUNICATION OF NEEDS Skin   Extensive Assist Verbally Surgical wounds, Wound Vac(Incision: Right Hip, Provena Wound Vac. )                       Personal Care Assistance Level of Assistance  Bathing, Feeding, Dressing Bathing Assistance: Limited assistance Feeding assistance: Independent Dressing Assistance: Limited assistance     Functional Limitations Info  Sight, Hearing, Speech Sight Info: Adequate Hearing Info: Adequate Speech Info: Adequate    SPECIAL CARE FACTORS FREQUENCY  PT (By licensed PT), OT (By licensed OT)     PT Frequency: (5) OT Frequency: (5)             Contractures      Additional Factors Info  Code Status, Allergies Code Status Info: (not on file ) Allergies Info: (No Known Allergies. )           Current Medications (04/20/2018):  This is the current hospital active medication list Current Facility-Administered Medications  Medication Dose Route Frequency Provider Last Rate Last Dose  . famotidine (PEPCID) 20 MG tablet           . lactated ringers infusion   Intravenous Continuous Kennedy Bucker, MD   Stopped at 04/20/18 1311     Discharge Medications: Please see discharge summary for a list of discharge medications.  Relevant Imaging Results:  Relevant Lab Results:   Additional Information (SSN: [id number])  Gradie Butrick, Darleen Crocker, LCSW

## 2018-04-20 NOTE — Transfer of Care (Signed)
Immediate Anesthesia Transfer of Care Note  Patient: Diana Powell  Procedure(s) Performed: TOTAL HIP CONVERSION (Right Hip)  Patient Location: PACU  Anesthesia Type:Spinal  Level of Consciousness: awake  Airway & Oxygen Therapy: Patient Spontanous Breathing  Post-op Assessment: Report given to RN, Patient follows commands  Post vital signs: stable  Last Vitals:  Vitals Value Taken Time  BP 106/44 04/20/2018  1:34 PM  Temp 36.5 C 04/20/2018  1:33 PM  Pulse 69 04/20/2018  1:36 PM  Resp 12 04/20/2018  1:36 PM  SpO2 99 % 04/20/2018  1:36 PM  Vitals shown include unvalidated device data.  Last Pain:  Vitals:   04/20/18 0840  TempSrc: Temporal  PainSc: 0-No pain         Complications: No apparent anesthesia complications

## 2018-04-20 NOTE — H&P (Signed)
Reviewed paper H+P, will be scanned into chart. No changes noted.  

## 2018-04-20 NOTE — Anesthesia Post-op Follow-up Note (Signed)
Anesthesia QCDR form completed.        

## 2018-04-20 NOTE — Anesthesia Preprocedure Evaluation (Signed)
Anesthesia Evaluation  Patient identified by MRN, date of birth, ID band Patient awake    Reviewed: Allergy & Precautions, H&P , NPO status , Patient's Chart, lab work & pertinent test results, reviewed documented beta blocker date and time   History of Anesthesia Complications Negative for: history of anesthetic complications  Airway Mallampati: I  TM Distance: >3 FB Neck ROM: full    Dental  (+) Edentulous Upper, Edentulous Lower   Pulmonary shortness of breath and with exertion, COPD,  COPD inhaler, neg recent URI,           Cardiovascular Exercise Tolerance: Good (-) hypertension(-) angina+CHF  (-) CAD, (-) Past MI, (-) Cardiac Stents and (-) CABG + dysrhythmias Atrial Fibrillation + pacemaker (-) Valvular Problems/Murmurs     Neuro/Psych PSYCHIATRIC DISORDERS Dementia negative neurological ROS     GI/Hepatic negative GI ROS, Neg liver ROS,   Endo/Other  negative endocrine ROS  Renal/GU negative Renal ROS  negative genitourinary   Musculoskeletal   Abdominal   Peds  Hematology negative hematology ROS (+)   Anesthesia Other Findings Past Medical History: No date: Atrial fibrillation (HCC) No date: Chronic systolic heart failure (HCC) No date: COPD (chronic obstructive pulmonary disease) (HCC) No date: Osteoarthritis, multiple sites No date: Osteoporosis, post-menopausal No date: Presence of permanent cardiac pacemaker   Reproductive/Obstetrics negative OB ROS                             Anesthesia Physical Anesthesia Plan  ASA: IV  Anesthesia Plan: Spinal   Post-op Pain Management:    Induction:   PONV Risk Score and Plan: Propofol infusion and TIVA  Airway Management Planned: Natural Airway and Nasal Cannula  Additional Equipment:   Intra-op Plan:   Post-operative Plan:   Informed Consent: I have reviewed the patients History and Physical, chart, labs and discussed  the procedure including the risks, benefits and alternatives for the proposed anesthesia with the patient or authorized representative who has indicated his/her understanding and acceptance.   Dental Advisory Given  Plan Discussed with: Anesthesiologist, CRNA and Surgeon  Anesthesia Plan Comments:         Anesthesia Quick Evaluation

## 2018-04-20 NOTE — Op Note (Signed)
04/20/2018  1:32 PM  PATIENT:  Diana Powell  84 y.o. female  PRE-OPERATIVE DIAGNOSIS:  post traumatic osteoarthritis of right hip.. With prior open reduction internal fixation  POST-OPERATIVE DIAGNOSIS:  post traumatic osteoarthritis of right hip.. Same  PROCEDURE:  Procedure(s): TOTAL HIP CONVERSION (Right) conversion of prior ORIF to total hip with removal of prior deep hardware  SURGEON: Leitha Schuller, MD  ASSISTANTS: Cranston Neighbor, PA-C  ANESTHESIA:   spinal  EBL:  Total I/O In: 925 [I.V.:800; Blood:125] Out: 400 [Urine:75; Blood:325]  BLOOD ADMINISTERED:250 CC CELLSAVER  DRAINS: none   LOCAL MEDICATIONS USED:  MARCAINE     SPECIMEN:  Source of Specimen:  Femoral head right  DISPOSITION OF SPECIMEN:  PATHOLOGY  COUNTS:  YES  TOURNIQUET:  * No tourniquets in log *  IMPLANTS: Medacta Quadra 7 stem short neck, M 28 mm metal head,52 mm Mpact DM cup and liner  DICTATION: .Dragon Dictation   The patient was brought to the operating room and after spinal anesthesia was obtained patient was placed on the operative table with the ipsilateral foot into the Medacta attachment, contralateral leg on a well-padded table. C-arm was brought in and preop template x-ray taken. After prepping and draping in usual sterile fashion appropriate patient identification and timeout procedures were completed.  As per the procedure involved removing the prior hardware through the old incision.  This is done through this lateral incision with very prominent hardware.  The blade plate was removed quite easily just pulling out on the and it came out the plate was then exposed and there had been some bony overgrowth and an osteotome was used to remove this initially just the first screw more proximal was removed but subsequently the second screw had a re-removed as the stem would have come directly into contact with removal of all this lateral hardware fibrillar was placed along the vastus lateralis to  minimize postop bleeding.  The lateral fascia was closed with a running #1 Vicryl 2-0 Vicryl subcutaneously and skin staples.  Going to the anterior approach then for placement of the hip, anterior approach to the hip was obtained and centered over the greater trochanter and TFL muscle. The subcutaneous tissue was incised hemostasis being achieved by electrocautery. TFL fascia was incised and the muscle retracted laterally deep retractor placed. The lateral femoral circumflex vessels were identified and ligated. The anterior capsule was exposed and a capsulotomy performed. The neck was identified and a femoral neck cut carried out with a saw. The head was removed without difficulty and showed sclerotic femoral head and acetabulum. Reaming was carried out to 50 mm and a 52 mm cup trial gave appropriate tightness to the acetabular component a 52 DM cup was impacted into position. The leg was then externally rotated and ischiofemoral and pubofemoral releases carried out. The femur was sequentially broached to a size 7 Quadra so that the tip of the stem would bypass the distal screw hole, size 7 stem with short neck and S head trials were placed and the final components chosen. The 7 Quadra short neck stem was inserted along with a millimeter metal 28 mm head and 52 mm liner. The hip was reduced and was stable the wound was thoroughly irrigated with fibrillar placed along the posterior capsule and medial neck. The deep fascia ws closed using a heavy Quill after infiltration of 30 cc of quarter percent Sensorcaine with epinephrine.3-0 V-loc to close the skin with skin staples. Xeroform and honeycomb dressing were used for  the lateral incision with an incisional wound VAC over the anterior hip  PLAN OF CARE: Admit to inpatient

## 2018-04-20 NOTE — Anesthesia Procedure Notes (Addendum)
Spinal  Patient location during procedure: OR Start time: 04/20/2018 11:00 AM End time: 04/20/2018 11:24 AM Staffing Anesthesiologist: Martha Clan, MD Resident/CRNA: Rolla Plate, CRNA Other anesthesia staff: Lia Foyer, RN Performed: resident/CRNA and other anesthesia staff  Preanesthetic Checklist Completed: patient identified, site marked, surgical consent, pre-op evaluation, timeout performed, IV checked, risks and benefits discussed and monitors and equipment checked Spinal Block Patient position: sitting Prep: ChloraPrep and site prepped and draped Patient monitoring: heart rate, continuous pulse ox, blood pressure and cardiac monitor Approach: midline Location: L4-5 Injection technique: single-shot Needle Needle type: Introducer and Pencan  Needle gauge: 25 G Needle length: 9 cm Additional Notes Negative paresthesia. Negative blood return. Positive free-flowing CSF. Expiration date of kit checked and confirmed. Patient tolerated procedure well, without complications.

## 2018-04-21 ENCOUNTER — Encounter: Payer: Self-pay | Admitting: Orthopedic Surgery

## 2018-04-21 LAB — CBC
HCT: 18.3 % — ABNORMAL LOW (ref 35.0–47.0)
HEMOGLOBIN: 5.8 g/dL — AB (ref 12.0–16.0)
MCH: 23.2 pg — ABNORMAL LOW (ref 26.0–34.0)
MCHC: 31.9 g/dL — ABNORMAL LOW (ref 32.0–36.0)
MCV: 72.6 fL — ABNORMAL LOW (ref 80.0–100.0)
Platelets: 184 10*3/uL (ref 150–440)
RBC: 2.52 MIL/uL — AB (ref 3.80–5.20)
RDW: 17.2 % — ABNORMAL HIGH (ref 11.5–14.5)
WBC: 4.2 10*3/uL (ref 3.6–11.0)

## 2018-04-21 LAB — BASIC METABOLIC PANEL
ANION GAP: 7 (ref 5–15)
ANION GAP: 10 (ref 5–15)
BUN: 19 mg/dL (ref 8–23)
BUN: 21 mg/dL (ref 8–23)
CALCIUM: 8.2 mg/dL — AB (ref 8.9–10.3)
CHLORIDE: 104 mmol/L (ref 98–111)
CO2: 29 mmol/L (ref 22–32)
CO2: 27 mmol/L (ref 22–32)
Calcium: 8.1 mg/dL — ABNORMAL LOW (ref 8.9–10.3)
Chloride: 100 mmol/L (ref 98–111)
Creatinine, Ser: 1.18 mg/dL — ABNORMAL HIGH (ref 0.44–1.00)
Creatinine, Ser: 1.39 mg/dL — ABNORMAL HIGH (ref 0.44–1.00)
GFR calc Af Amer: 38 mL/min — ABNORMAL LOW (ref >60)
GFR calc non Af Amer: 40 mL/min — ABNORMAL LOW (ref >60)
GFR, EST AFRICAN AMERICAN: 46 mL/min — AB (ref >60)
GFR, EST NON AFRICAN AMERICAN: 33 mL/min — AB (ref >60)
Glucose, Bld: 156 mg/dL — ABNORMAL HIGH (ref 70–99)
Glucose, Bld: 168 mg/dL — ABNORMAL HIGH (ref 70–99)
POTASSIUM: 4.3 mmol/L (ref 3.5–5.1)
POTASSIUM: 4.3 mmol/L (ref 3.5–5.1)
SODIUM: 137 mmol/L (ref 135–145)
Sodium: 140 mmol/L (ref 135–145)

## 2018-04-21 LAB — HEMOGLOBIN AND HEMATOCRIT, BLOOD
HEMATOCRIT: 24.7 % — AB (ref 35.0–47.0)
HEMOGLOBIN: 7.9 g/dL — AB (ref 12.0–16.0)

## 2018-04-21 LAB — PREPARE RBC (CROSSMATCH)

## 2018-04-21 MED ORDER — SODIUM CHLORIDE 0.9% IV SOLUTION
Freq: Once | INTRAVENOUS | Status: AC
  Administered 2018-04-21: 10:00:00 via INTRAVENOUS

## 2018-04-21 NOTE — Progress Notes (Signed)
   Subjective: 1 Day Post-Op Procedure(s) (LRB): TOTAL HIP CONVERSION (Right) Patient reports pain as mild to moderate. Patient is well with moderate complaints of nausea and right ankle pain. Denies any CP, SOB, ABD pain. We will start physical therapy today.    Objective: Vital signs in last 24 hours: Temp:  [97.4 F (36.3 C)-98.6 F (37 C)] 97.8 F (36.6 C) (10/04 0757) Pulse Rate:  [69-70] 70 (10/04 0757) Resp:  [15-28] 16 (10/04 0757) BP: (99-143)/(44-63) 102/49 (10/04 0757) SpO2:  [89 %-100 %] 99 % (10/04 0757) FiO2 (%):  [2 %] 2 % (10/03 1757) Weight:  [57.3 kg] 57.3 kg (10/03 0840)  Intake/Output from previous day: 10/03 0701 - 10/04 0700 In: 1315 [P.O.:240; I.V.:950; Blood:125] Out: 925 [Urine:275; Drains:325; Blood:325] Intake/Output this shift: No intake/output data recorded.  Recent Labs    04/21/18 0647  HGB 5.8*   Recent Labs    04/21/18 0647  WBC 4.2  RBC 2.52*  HCT 18.3*  PLT 184   Recent Labs    04/21/18 0647  NA 140  K 4.3  CL 104  CO2 29  BUN 19  CREATININE 1.18*  GLUCOSE 156*  CALCIUM 8.1*   Recent Labs    04/20/18 0836  INR 1.14    EXAM General - Patient is Alert, Appropriate and Oriented Extremity - Neurovascular intact Sensation intact distally Intact pulses distally Dorsiflexion/Plantar flexion intact No cellulitis present Compartment soft  Compartment soft Dressing - dressing C/D/I and no drainage, wound VAC intact with 350 cc of bloody drainage Motor Function - intact, moving foot and toes well on exam.   Past Medical History:  Diagnosis Date  . Atrial fibrillation (HCC)   . Chronic systolic heart failure (HCC)   . COPD (chronic obstructive pulmonary disease) (HCC)   . Osteoarthritis, multiple sites   . Osteoporosis, post-menopausal   . Presence of permanent cardiac pacemaker     Assessment/Plan:   1 Day Post-Op Procedure(s) (LRB): TOTAL HIP CONVERSION (Right) Active Problems:   Status post total hip  replacement, right   Acute post op blood loss anemia   Estimated body mass index is 24.67 kg/m as calculated from the following:   Height as of this encounter: 5' (1.524 m).   Weight as of this encounter: 57.3 kg. Advance diet Up with therapy, weightbearing as tolerated   Needs bowel movement  Acute post op blood loss anemia -hemoglobin 5.8.  Transfuse 2 units of packed red blood cells and recheck labs posttransfusion.  Continue with iron supplementation.  Vital signs stable.  Patient slightly hypotensive, will hold amlodipine  Care management to assist with discharge   DVT Prophylaxis - Foot Pumps and SCDs, Xarelto Weight-Bearing as tolerated to right leg   T. Cranston Neighbor, PA-C [facility] 04/21/2018, 8:11 AM

## 2018-04-21 NOTE — Anesthesia Postprocedure Evaluation (Signed)
Anesthesia Post Note  Patient: Diana Powell  Procedure(s) Performed: TOTAL HIP CONVERSION (Right Hip)  Patient location during evaluation: Other Anesthesia Type: Spinal Level of consciousness: oriented and awake and alert Pain management: pain level controlled Vital Signs Assessment: post-procedure vital signs reviewed and stable Respiratory status: spontaneous breathing, respiratory function stable and patient connected to nasal cannula oxygen Cardiovascular status: blood pressure returned to baseline and stable Postop Assessment: no headache, no backache and no apparent nausea or vomiting Anesthetic complications: no     Last Vitals:  Vitals:   04/21/18 0935 04/21/18 1000  BP: (!) 98/48 (!) 103/49  Pulse: 69 70  Resp:  18  Temp: 36.5 C 36.5 C  SpO2: 99% 99%    Last Pain:  Vitals:   04/21/18 1000  TempSrc: Oral  PainSc:                  Starling Manns

## 2018-04-21 NOTE — Clinical Social Work Note (Signed)
Clinical Social Work Assessment  Patient Details  Name: Diana Powell MRN: [id number] Date of Birth: Mar 18, 1927  Date of referral:  04/21/18               Reason for consult:  Facility Placement                Permission sought to share information with:  Chartered certified accountant granted to share information::  Yes, Verbal Permission Granted  Name::      [place]::   [place]   Relationship::     Contact Information:     Housing/Transportation Living arrangements for the past 2 months:  [place] of Information:  Adult Children Patient Interpreter Needed:  None Criminal Activity/Legal Involvement Pertinent to Current Situation/Hospitalization:  No - Comment as needed Significant Relationships:  Adult Children Lives with:  Adult Children Do you feel safe going back to the place where you live?    Need for family participation in patient care:  Yes (Comment)  Care giving concerns:  Patient lives in [place] with her son/ [place] [phone] and her daughter in law.    Social Worker assessment / plan:  Holiday representative (CSW) received SNF consult. PT is pending. CSW met with patient however she was pleasantly confused and could not participate in assessment. Patient's son Cristie Hem was at bedside. Per son patient speaks russian and does not speak english. Alex speaks english. Per son patient lives with him and his wife in [place]. Per son at baseline patient can walk with a walker and is very mobile. Per son patient has close to 24/7 supervision at home. CSW explained that PT may recommend SNF and that SNF options are limited because patient has Medicare Part B only and Medicaid. CSW explained that Medicare Part A pays for SNF if a 3 night qualifying inpatient stay is completed. Son verbalized his understanding and is agreeable to SNF search. FL2 complete and faxed out.   CSW made son aware that [facility] is the only bed offer. CSW explained that patient would have to stay at [facility] for 30 days in order for Medicaid to cover it. Son reported that he will consider the [facility] however he may end up taking patient home. Per son he wants to be available to help patient interpret because she does not speak or understand english. CSW explained the benefits of SNF. Per son he will discuss it with his wife and get back to CSW with his answer. CSW made son aware that patient may D/C Sunday or Monday depending on her medical clearance. [facility] admissions coordinator at [facility] is aware of above. CSW will continue to follow and assist as needed.    Employment status:  Disabled (Comment on whether or not currently receiving Disability) Insurance information:  Medicare, Medicaid [place] PT Recommendations:  Not assessed at this time Information / Referral to community resources:  [place]  Patient/Family's Response to care:  Patient will D/C to [facility] pending patient's son's decision.   Patient/Family's Understanding of and Emotional Response to Diagnosis, Current Treatment, and Prognosis:  Patient's son was very pleasant and thanked CSW for assistance.   Emotional Assessment Appearance:  Appears stated age Attitude/Demeanor/Rapport:  Unable to Assess Affect (typically observed):  Unable to Assess Orientation:  Oriented to Self, Oriented to Place, Fluctuating Orientation (Suspected and/or reported Sundowners) Alcohol /  Substance use:  Not Applicable Psych involvement (Current and /or in the community):  No (Comment)  Discharge Needs  Concerns to be addressed:  Discharge Planning Concerns Readmission within the last 30 days:  No Current discharge risk:  Dependent with Mobility, Cognitively Impaired Barriers to Discharge:  Continued Medical Work up   [employer], Veronia Beets, LCSW 04/21/2018, 4:37 PM

## 2018-04-21 NOTE — Progress Notes (Signed)
Per pt's son, pt has been confused from baseline this afternoon. Last night, pt's pain was uncontrolled per her son; he was very anxious this morning about pain control and was educated on what pain meds were available and at what times. Pt received scheduled tramadol at 6 &11 amand 1 7.5 mg hydrocodone tab at 8. Pt seems more comfortable this afternoon and pt and her son educated that since she is more confused, we will minimize narcotics until mental status improves. He continues to be anxious, but seems agreeable to this plan.   [place], Latricia Heft

## 2018-04-21 NOTE — Progress Notes (Signed)
PT Cancellation Note  Patient Details Name: Salome Hautala MRN: [id number] DOB: 01/30/1927   Cancelled Treatment:    Reason Eval/Treat Not Completed: Medical issues which prohibited therapy: Pt's Hbg 5.8 and HCT 18.3 both falling outside guidelines for participation with PT services.  Will attempt to see pt tomorrow as medically appropriate.     Ovidio Hanger PT, DPT 04/21/18, 2:45 PM

## 2018-04-21 NOTE — Progress Notes (Signed)
PT Cancellation Note  Patient Details Name: Diana Powell MRN: [id number] DOB: 10-21-1926   Cancelled Treatment:    Reason Eval/Treat Not Completed: Medical issues which prohibited therapy: Pt's Hgb 5.8 falling outside guidelines for participation with PT services with pt scheduled for a transfusion.  Will attempt to see pt at at future date/time as medically appropriate.     Ovidio Hanger PT, DPT 04/21/18, 10:31 AM

## 2018-04-21 NOTE — Progress Notes (Signed)
OT Cancellation Note  Patient Details Name: Diana Powell MRN: [id number] DOB: Jun 15, 1927   Cancelled Treatment:    Reason Eval/Treat Not Completed: Medical issues which prohibited therapy(Pt. Hgb is 5.8. Will continue to monitor, and perform the initial OT visit when appropriate.)  Olegario Messier, MS, OTR/L 04/21/2018, 9:44 AM

## 2018-04-21 NOTE — Clinical Social Work Placement (Signed)
   CLINICAL SOCIAL WORK PLACEMENT  NOTE  Date:  04/21/2018  Patient Details  Name: Diana Powell MRN: [id number] Date of Birth: December 28, 1926  Clinical Social Work is seeking post-discharge placement for this patient at the Skilled  Nursing Facility level of care (*CSW will initial, date and re-position this form in  chart as items are completed):  Yes   Patient/family provided with [facility] Clinical Social Work Department's list of facilities offering this level of care within the geographic area requested by the patient (or if unable, by the patient's family).  Yes   Patient/family informed of their freedom to choose among providers that offer the needed level of care, that participate in Medicare, Medicaid or managed care program needed by the patient, have an available bed and are willing to accept the patient.  Yes   Patient/family informed of [facility]'s ownership interest in [facility] and [facility], as well as of the fact that they are under no obligation to receive care at these facilities.  PASRR submitted to EDS on 04/20/18     PASRR number received on 04/20/18     Existing PASRR number confirmed on       FL2 transmitted to all facilities in geographic area requested by pt/family on 04/21/18     FL2 transmitted to all facilities within larger geographic area on       Patient informed that his/her managed care company has contracts with or will negotiate with certain facilities, including the following:        Yes   Patient/family informed of bed offers received.  Patient chooses bed at [facility] )     Physician recommends and patient chooses bed at      Patient to be transferred to   on  .  Patient to be transferred to facility by       Patient family notified on   of transfer.  Name of family member notified:        PHYSICIAN       Additional Comment:    _______________________________________________ Ashani Pumphrey, Darleen Crocker,  LCSW 04/21/2018, 4:36 PM

## 2018-04-22 LAB — BASIC METABOLIC PANEL
ANION GAP: 9 (ref 5–15)
BUN: 22 mg/dL (ref 8–23)
CO2: 29 mmol/L (ref 22–32)
Calcium: 8.5 mg/dL — ABNORMAL LOW (ref 8.9–10.3)
Chloride: 100 mmol/L (ref 98–111)
Creatinine, Ser: 1.39 mg/dL — ABNORMAL HIGH (ref 0.44–1.00)
GFR, EST AFRICAN AMERICAN: 38 mL/min — AB (ref >60)
GFR, EST NON AFRICAN AMERICAN: 33 mL/min — AB (ref >60)
GLUCOSE: 139 mg/dL — AB (ref 70–99)
POTASSIUM: 4.5 mmol/L (ref 3.5–5.1)
SODIUM: 138 mmol/L (ref 135–145)

## 2018-04-22 LAB — CBC
HCT: 26.4 % — ABNORMAL LOW (ref 35.0–47.0)
HEMOGLOBIN: 8.2 g/dL — AB (ref 12.0–16.0)
MCH: 23.6 pg — ABNORMAL LOW (ref 26.0–34.0)
MCHC: 31.2 g/dL — AB (ref 32.0–36.0)
MCV: 75.8 fL — ABNORMAL LOW (ref 80.0–100.0)
PLATELETS: 214 10*3/uL (ref 150–440)
RBC: 3.48 MIL/uL — ABNORMAL LOW (ref 3.80–5.20)
RDW: 19 % — ABNORMAL HIGH (ref 11.5–14.5)
WBC: 9.5 10*3/uL (ref 3.6–11.0)

## 2018-04-22 LAB — HEMOGLOBIN: HEMOGLOBIN: 7.9 g/dL — AB (ref 12.0–16.0)

## 2018-04-22 NOTE — Progress Notes (Signed)
Stopped by pt room to speak to patient/son. Author acknowledged that patient is still limited in mobility d/t pain, noted after OT evaluation. Author attempting clarification on pt/caregiver preference for additional pain meds, as there are conflicting reports from other providers this date. PT emphasized that additional pain medications may help the patient tolerate mobility better, even when the patient is having controlled pain resting. Son is agreeable to trial of additional analgesia this date and then Thereasa Parkin will return for BID treatment.   2:34 PM, 04/22/18 Rosamaria Lints, PT, DPT Physical Therapist - [facility]  [phone] [facility])

## 2018-04-22 NOTE — Evaluation (Signed)
Occupational Therapy Evaluation Patient Details Name: Diana Powell MRN: [id number] DOB: Oct 11, 1926 Today's Date: 04/22/2018    History of Present Illness Diana Powell is an 84yo [place] speaking female who comes to [facility] on 10/3 for Rt hip ORIF conversion to anterior THA. PTA, pt AMB mostly in the home with [facility], frequent close calls, and 1 fall in June with Lt wrist fracture. Pt lives with family and has AIDE 5d/week from 9-3. Pt needed assistance with ADL PTA.    Clinical Impression   Pt admitted with above diagnoses. Pt only speaks [place], pt and family defer translator and for son to complete all communication. Uncontrolled pain severely limiting ability to engage in BADL tasks this date. Attempted EOB t/f, while removing pillow from R leg, pt shouts out and reported to say that we are all done with therapy. Attempted to hold R leg up with L leg until pillow replaced. Repositioned and made comfortable in bed. Offered ADL engagement, pt deferring. Spent most of session speaking to son about his concerns with pain control and her immobility, he appears rather frustrated and asking OT for pain meds for pt. RN reports pt has been refusing pain meds and family reports she is not offered pain meds. SNF at d/c is most appropriate recommendation this date considering difficulties to engage in functional BADL this date. Will continue to follow pt acutely to improve functional transfers and BADL engagement.    Follow Up Recommendations  SNF    Equipment Recommendations  Other (comment)(TBD per d/c choice from family)    Recommendations for Other Services       Precautions / Restrictions Precautions Precautions: Fall Restrictions Weight Bearing Restrictions: Yes RLE Weight Bearing: Weight bearing as tolerated      Mobility Bed Mobility               General bed mobility comments: Pt not able to tolerate bed mobility this date 2/2 to severe pain and not agreeable  Transfers                  General transfer comment: not tested this date    Balance Overall balance assessment: History of Falls                                         ADL either performed or assessed with clinical judgement   ADL Overall ADL's : Needs assistance/impaired Eating/Feeding: Set up;Sitting   Grooming: Set up;Bed level   Upper Body Bathing: Bed level;Moderate assistance   Lower Body Bathing: Total assistance;Bed level;Sit to/from stand   Upper Body Dressing : Minimal assistance;Bed level;Sitting   Lower Body Dressing: Total assistance;Bed level   Toilet Transfer: Total assistance;BSC   Toileting- Clothing Manipulation and Hygiene: Total assistance;Sit to/from stand   Tub/ Engineer, structural: Total assistance;Shower seat   Functional mobility during ADLs: Total assistance General ADL Comments: Pt severely limited 2/2 to pain this date. Attempted EOB t/f, once moving pillow from leg pt shouting for all therapy to stop. Unable to tolerate any functional activity this date 2/2 pain     Vision Baseline Vision/History: Macular Degeneration(hx of macular degeneration and cataracts in R eye) Patient Visual Report: No change from baseline       Perception     Praxis      Pertinent Vitals/Pain Pain Assessment: Faces Faces Pain Scale: Hurts even more Pain Location: R thigh when removing  pillow under leg Pain Intervention(s): Limited activity within patient's tolerance;Monitored during session;Repositioned     Hand Dominance     Extremity/Trunk Assessment Upper Extremity Assessment Upper Extremity Assessment: Generalized weakness;LUE deficits/detail LUE Deficits / Details: previous L wrist fracture still affecting ADLs   Lower Extremity Assessment Lower Extremity Assessment: Generalized weakness       Communication Communication Communication: Prefers language other than English(russian)   Cognition Arousal/Alertness: Awake/alert Behavior During  Therapy: WFL for tasks assessed/performed Overall Cognitive Status: Impaired/Different from baseline Area of Impairment: (difficult to assess 2/2 language barrier)                               General Comments: some cognitive difficulty at baseline per family report, but this date pt is reporting seeing things that are not present such as a baby and a sewing machine   General Comments       Exercises     Shoulder Instructions      Home Living Family/patient expects to be discharged to:: Private residence Living Arrangements: Children;Other (Comment)(son, son's wife, GD) Available Help at Discharge: Family;Personal care attendant;Other (Comment)(9-3 aide) Type of Home: House Home Access: Stairs to enter Entrance Stairs-Number of Steps: 2-2 inch steps   Home Layout: Two level;Able to live on main level with bedroom/bathroom     Bathroom Shower/Tub: Producer, television/film/video: Standard     Home Equipment: Environmental consultant - 2 wheels;Cane - single point;Shower seat;Hand held shower head;Wheelchair - manual;Bedside commode          Prior Functioning/Environment Level of Independence: Needs assistance  Gait / Transfers Assistance Needed: family reports she was using SPC and furniture walking throughout house with 1 fall in the last 3 mo resulting in wrist fracture, and several close calls of falling ADL's / Homemaking Assistance Needed: needing A with showering in shower seat, toileting Communication / Swallowing Assistance Needed: russian speaking, no english          OT Problem List: Decreased strength;Impaired balance (sitting and/or standing);Decreased cognition;Pain;Decreased range of motion;Decreased activity tolerance;Decreased knowledge of use of DME or AE;Impaired UE functional use      OT Treatment/Interventions: Self-care/ADL training;DME and/or AE instruction;Balance training;Patient/family education    OT Goals(Current goals can be found in the care  plan section) Acute Rehab OT Goals Patient Stated Goal: to control pain OT Goal Formulation: With patient/family Time For Goal Achievement: 05/06/18 Potential to Achieve Goals: Good  OT Frequency: Min 2X/week   Barriers to D/C:            Co-evaluation              AM-PAC PT "6 Clicks" Daily Activity     Outcome Measure Help from another person eating meals?: None Help from another person taking care of personal grooming?: A Little(bed level) Help from another person toileting, which includes using toliet, bedpan, or urinal?: Total(for OOB toileting) Help from another person bathing (including washing, rinsing, drying)?: Total Help from another person to put on and taking off regular upper body clothing?: A Little Help from another person to put on and taking off regular lower body clothing?: Total 6 Click Score: 13   End of Session Equipment Utilized During Treatment: Oxygen Nurse Communication: Mobility status;Patient requests pain meds  Activity Tolerance: Patient limited by pain Patient left: in bed;with call bell/phone within reach;with bed alarm set;with family/visitor present;with SCD's reapplied  OT Visit Diagnosis: Other abnormalities of gait  and mobility (R26.89);Muscle weakness (generalized) (M62.81);Unsteadiness on feet (R26.81);Pain Pain - Right/Left: Right Pain - part of body: Hip;Leg                Time: 1310-1351 OT Time Calculation (min): 41 min Charges:  OT General Charges $OT Visit: 1 Visit OT Evaluation $OT Eval Moderate Complexity: 1 Mod  Dalphine Handing, MSOT, OTR/L Behavioral Health OT/ Acute Relief OT Ascom: [id number]  Dalphine Handing 04/22/2018, 2:13 PM

## 2018-04-22 NOTE — Progress Notes (Signed)
Physical Therapy Treatment Patient Details Name: Diana Powell MRN: [id number] DOB: 10/20/1926 Today's Date: 04/22/2018    History of Present Illness Taran Hable is an 84yo [place] speaking female who comes to [facility] on 10/3 for Rt hip ORIF conversion to anterior THA. PTA, pt AMB mostly in the home with [facility], frequent close calls, and 1 fall in June with Lt wrist fracture. Pt lives with family and has AIDE 5d/week from 9-3. Pt needed assistance with ADL PTA.     PT Comments    Pain management better coordinated for this session. Pt better able to participate and with decreased pain, but still quite limited in joint range in frontal and sagittal plane to about 35-40 degrees. Pt appears to be limited by muslce spasm pain more than anything else, her quads appearing tetanic with faster movements that alter hip/knee joint position. Overall this session was much more productive than earlier this date. RN notified. Family in room at end of session. Son provided interpretation russian to/from [facility].    Follow Up Recommendations  SNF;Supervision/Assistance - 24 hour     Equipment Recommendations       Recommendations for Other Services       Precautions / Restrictions Precautions Precautions: Fall Restrictions Weight Bearing Restrictions: Yes RLE Weight Bearing: Weight bearing as tolerated    Mobility  Bed Mobility Overal bed mobility: (tolerating limited RLE movement only. )             General bed mobility comments: Pt not able to tolerate bed mobility this date 2/2 to severe pain and not agreeable  Transfers                 General transfer comment: not tested this date  Ambulation/Gait                 Stairs             Wheelchair Mobility    Modified Rankin (Stroke Patients Only)       Balance Overall balance assessment: History of Falls                                          Cognition Arousal/Alertness:  Awake/alert Behavior During Therapy: WFL for tasks assessed/performed Overall Cognitive Status: Within Functional Limits for tasks assessed Area of Impairment: (difficult to assess 2/2 language barrier)                               General Comments: some cognitive difficulty at baseline per family report, but this date pt is reporting seeing things that are not present such as a baby and a sewing machine      Exercises Total Joint Exercises Ankle Circles/Pumps: AROM;Both;20 reps Short Arc Quad: AAROM;AROM;Left;Right;10 reps(requires modA on right and slow movements) Heel Slides: AROM;Left;10 reps;Right(very slow movement tolerated only (right) ) Other Exercises Other Exercises: B leg press, manually resisted: 1x10     General Comments        Pertinent Vitals/Pain Pain Assessment: Faces Faces Pain Scale: Hurts little more Pain Location: Intermittent right thigh pain, appears to be muscle spasm related; ceases when quads go flaccid.  Pain Descriptors / Indicators: (pt doe snot describe when cued) Pain Intervention(s): Limited activity within patient's tolerance    Home Living Family/patient expects to be discharged to:: Private residence Living Arrangements: Children;Other (  Comment)(son, son's wife, GD) Available Help at Discharge: Family;Personal care attendant;Other (Comment)(9-3 aide) Type of Home: House Home Access: Stairs to enter   Home Layout: Two level;Able to live on main level with bedroom/bathroom Home Equipment: Dan Humphreys - 2 wheels;Cane - single point;Shower seat;Hand held shower head;Wheelchair - manual;Bedside commode      Prior Function Level of Independence: Needs assistance  Gait / Transfers Assistance Needed: family reports she was using SPC and furniture walking throughout house with 1 fall in the last 3 mo resulting in wrist fracture, and several close calls of falling ADL's / Homemaking Assistance Needed: needing A with showering in shower seat,  toileting     PT Goals (current goals can now be found in the care plan section) Acute Rehab PT Goals Patient Stated Goal: to control pain PT Goal Formulation: With patient Time For Goal Achievement: 05/06/18 Potential to Achieve Goals: Poor Progress towards PT goals: Progressing toward goals    Frequency    BID      PT Plan Current plan remains appropriate    Co-evaluation              AM-PAC PT "6 Clicks" Daily Activity  Outcome Measure  Difficulty turning over in bed (including adjusting bedclothes, sheets and blankets)?: Unable Difficulty moving from lying on back to sitting on the side of the bed? : Unable Difficulty sitting down on and standing up from a chair with arms (e.g., wheelchair, bedside commode, etc,.)?: Unable Help needed moving to and from a bed to chair (including a wheelchair)?: Total Help needed walking in hospital room?: Total Help needed climbing 3-5 steps with a railing? : Total 6 Click Score: 6    End of Session Equipment Utilized During Treatment: Oxygen Activity Tolerance: Patient limited by pain;Patient limited by fatigue Patient left: in bed;with call bell/phone within reach;with SCD's reapplied;with family/visitor present Nurse Communication: Mobility status PT Visit Diagnosis: Muscle weakness (generalized) (M62.81);Unsteadiness on feet (R26.81);Difficulty in walking, not elsewhere classified (R26.2);Pain Pain - Right/Left: Right Pain - part of body: Hip     Time: 1610-9604 PT Time Calculation (min) (ACUTE ONLY): 26 min  Charges:  $Therapeutic Exercise: 8-22 mins $Therapeutic Activity: 8-22 mins                     3:59 PM, 04/22/18 Rosamaria Lints, PT, DPT Physical Therapist - [facility]  [phone] (ASCOM)      Buccola,Allan C 04/22/2018, 3:56 PM

## 2018-04-22 NOTE — Evaluation (Signed)
Physical Therapy Evaluation Patient Details Name: Diana Powell MRN: [id number] DOB: [birth year] Today's Date: 04/22/2018   History of Present Illness  Honore Wipperfurth is an 84yo [place] speaking female who comes to [facility] on 10/3 for Rt hip ORIF conversion to anterior THA. PTA, pt AMB mostly in the home with [facility], frequent close calls, and 1 fall in June with Lt wrist fracture. Pt lives with family and has AIDE 5d/week from 9-3. Pt needed assistance with ADL PTA.   Clinical Impression  Pt admitted with above diagnosis. Pt currently with functional limitations due to the deficits listed below (see "PT Problem List"). Upon entry, pt in bed, pt's son and granddaughter present. Son decline offer to use [place] interpreter and offers to facilitate communication himself. The pt is awake and conditionally agreeable to participate, but reports exhaustion and difficulty managing pain this date. Per family pt is very confused at this time, but pt is alert and pleasant, conversational, and following simple commands consistently. Pt participates in some low level bed exercises, but has poor tolerance on RLE, better on LLE, and is limited on LUE d/t chronic Lt wrist fracture. Functional mobility assessment demonstrates increased effort/time requirements, poor tolerance, and need for physical  assistance, whereas the patient performed these at a higher level of independence PTA. Pt will benefit from skilled PT intervention to increase independence and safety with basic mobility in preparation for discharge to the venue listed below.       Follow Up Recommendations SNF;Supervision/Assistance - 24 hour    Equipment Recommendations  None recommended by PT(will benefit from hospitla bed if unable to go to STR)    Recommendations for Other Services       Precautions / Restrictions Precautions Precautions: None Restrictions RLE Weight Bearing: Weight bearing as tolerated      Mobility  Bed Mobility Overal bed  mobility: (Pt not tolerating simple knee joint movement enough to agree to bed mobility. )                Transfers                    Ambulation/Gait                Stairs            Wheelchair Mobility    Modified Rankin (Stroke Patients Only)       Balance Overall balance assessment: History of Falls;Needs assistance(frequent LOB at home. )                                           Pertinent Vitals/Pain Pain Assessment: 0-10 Pain Score: 4  Pain Location: Right thigh at rest, increases to ~8/10 with slight movement.  Pain Intervention(s): Limited activity within patient's tolerance;Monitored during session;Premedicated before session    Home Living Family/patient expects to be discharged to:: Private residence Living Arrangements: Children(son, wife daughter) Available Help at Discharge: Family;Personal care attendant(9:00-3:00p aide assists c ADL ) Type of Home: House Home Access: Stairs to enter   [employer] of Steps: 2-2 inch steps Home Layout: Two level;Able to live on main level with bedroom/bathroom Home Equipment: Dan Humphreys - 2 wheels;Cane - single point;Shower seat;Hand held shower head;Wheelchair - manual;Bedside commode      Prior Function Level of Independence: Needs assistance   Gait / Transfers Assistance Needed: Mostly household AMB, SPC mostly although family  has emphasized RW. Will use RW when not feeling well. 3months fall history x1 on June 1, lots of close calls.   ADL's / Homemaking Assistance Needed: typically needs help with bathing, toiletting/clean-up, sits to shower.   Comments: visual difficulty d/t macular degeneration.      Hand Dominance        Extremity/Trunk Assessment   Upper Extremity Assessment Upper Extremity Assessment: Generalized weakness(recent Lt wrist fracture, still with pain during LUE use. )    Lower Extremity Assessment Lower Extremity Assessment: Generalized  weakness       Communication   Communication: Prefers language other than English([place])  Cognition Arousal/Alertness: Awake/alert Behavior During Therapy: WFL for tasks assessed/performed Overall Cognitive Status: History of cognitive impairments - at baseline(some cognitive deficits at baseline, but family reports patient is more confused and somewhat paranoid. )                                        General Comments      Exercises Total Joint Exercises Ankle Circles/Pumps: AROM;Both;20 reps Short Arc Quad: AAROM;AROM;Left;Right;10 reps;15 reps(poor quads activation right, limited strongly by pain) Heel Slides: AROM;Left;10 reps Straight Leg Raises: AAROM;Left;10 reps Other Exercises Other Exercises: Resisted elbow extension: 5x Left (limited by pain); 10x Right. Lt overhead reach 5x  Other Exercises: Lt leg press, manually resisted: 1x10    Assessment/Plan    PT Assessment Patient needs continued PT services  PT Problem List Decreased strength;Decreased activity tolerance;Decreased range of motion;Decreased mobility;Cardiopulmonary status limiting activity;Decreased knowledge of use of DME;Decreased cognition       PT Treatment Interventions DME instruction;Gait training;Balance training;Functional mobility training;Stair training;Therapeutic activities;Therapeutic exercise;Patient/family education    PT Goals (Current goals can be found in the Care Plan section)  Acute Rehab PT Goals Patient Stated Goal: to be left alone, so her pain is controlled PT Goal Formulation: With patient Time For Goal Achievement: 05/06/18 Potential to Achieve Goals: Poor    Frequency BID   Barriers to discharge Inaccessible home environment;Decreased caregiver support pt will need +2, and 24/7 caregiver assistance    Co-evaluation               AM-PAC PT "6 Clicks" Daily Activity  Outcome Measure Difficulty turning over in bed (including adjusting bedclothes,  sheets and blankets)?: Unable Difficulty moving from lying on back to sitting on the side of the bed? : Unable Difficulty sitting down on and standing up from a chair with arms (e.g., wheelchair, bedside commode, etc,.)?: Unable Help needed moving to and from a bed to chair (including a wheelchair)?: Total Help needed walking in hospital room?: Total Help needed climbing 3-5 steps with a railing? : Total 6 Click Score: 6    End of Session Equipment Utilized During Treatment: Oxygen Activity Tolerance: Patient limited by pain Patient left: in bed;with call bell/phone within reach;with SCD's reapplied;with family/visitor present;with nursing/sitter in room Nurse Communication: Mobility status PT Visit Diagnosis: Muscle weakness (generalized) (M62.81);Unsteadiness on feet (R26.81);Difficulty in walking, not elsewhere classified (R26.2);Pain Pain - Right/Left: Right Pain - part of body: Hip    Time: 0927-1006 PT Time Calculation (min) (ACUTE ONLY): 39 min   Charges:   PT Evaluation $PT Eval Moderate Complexity: 1 Mod PT Treatments $Therapeutic Activity: 23-37 mins        10:31 AM, 04/22/18 Rosamaria Lints, PT, DPT Physical Therapist - [facility]  [phone]  ASCOM)    Rivaldo Hineman C 04/22/2018, 10:19 AM

## 2018-04-22 NOTE — Progress Notes (Signed)
OT Cancellation Note  Patient Details Name: Josselyn Harkins MRN: [id number] DOB: October 18, 1926   Cancelled Treatment:    Reason Eval/Treat Not Completed: Other (comment) Met with pt and granddaughter in room. Pt primarily speaks [place] and family prefers son to be Optometrist and defer use of other translators. Pt and granddaughter informed OT that son will be back this PM, will return for eval when son present.   Zenovia Jarred, MSOT, OTR/L Behavioral Health OT/ Acute Relief OT   Zenovia Jarred 04/22/2018, 10:49 AM

## 2018-04-22 NOTE — Progress Notes (Signed)
   Subjective: 2 Days Post-Op Procedure(s) (LRB): TOTAL HIP CONVERSION (Right) Patient reports pain as moderate. Son reports increased confusion. Patient denies any N/V this AM. Denies any CP, SOB, ABD pain. We will start physical therapy today.   Objective: Vital signs in last 24 hours: Temp:  [97.6 F (36.4 C)-98.5 F (36.9 C)] 98 F (36.7 C) (10/04 2337) Pulse Rate:  [69-70] 70 (10/04 2339) Resp:  [16-19] 19 (10/04 2337) BP: (98-123)/(46-58) 123/50 (10/04 2337) SpO2:  [86 %-100 %] 91 % (10/04 2339)  Intake/Output from previous day: 10/04 0701 - 10/05 0700 In: 903 [P.O.:240; Blood:663] Out: 425 [Urine:400; Drains:25] Intake/Output this shift: No intake/output data recorded.  Recent Labs    04/21/18 0647 04/21/18 1740 04/22/18 0629  HGB 5.8* 7.9* 8.2*   Recent Labs    04/21/18 0647 04/21/18 1740 04/22/18 0629  WBC 4.2  --  9.5  RBC 2.52*  --  3.48*  HCT 18.3* 24.7* 26.4*  PLT 184  --  214   Recent Labs    04/21/18 1740 04/22/18 0629  NA 137 138  K 4.3 4.5  CL 100 100  CO2 27 29  BUN 21 22  CREATININE 1.39* 1.39*  GLUCOSE 168* 139*  CALCIUM 8.2* 8.5*   Recent Labs    04/20/18 0836  INR 1.14    EXAM General - Patient is Alert, Appropriate and Oriented Extremity - Neurovascular intact Sensation intact distally Intact pulses distally Dorsiflexion/Plantar flexion intact No cellulitis present Compartment soft  Dressing - Woundvac intact to the right hip, woundvac drainage bloody discharge, no purulence. Motor Function - intact, moving foot and toes well on exam.  Able to dorsiflex and plantar flex the right foot without discomfort.  Past Medical History:  Diagnosis Date  . Atrial fibrillation (HCC)   . Chronic systolic heart failure (HCC)   . COPD (chronic obstructive pulmonary disease) (HCC)   . Osteoarthritis, multiple sites   . Osteoporosis, post-menopausal   . Presence of permanent cardiac pacemaker     Assessment/Plan:   2 Days  Post-Op Procedure(s) (LRB): TOTAL HIP CONVERSION (Right) Active Problems:   Status post total hip replacement, right   Acute post op blood loss anemia   Estimated body mass index is 24.67 kg/m as calculated from the following:   Height as of this encounter: 5' (1.524 m).   Weight as of this encounter: 57.3 kg. Advance diet Up with therapy, weightbearing as tolerated   Labs reviewed this AM. Received 2 units of PRBC yesterday, Hg 8.2 this AM.  BP 123/50. Will re-order Hg for 2 pm this Afternoon and again tomorrow morning. Pt did remove IV, if Hg remains stable and BP remains good and she is tolerating po intake will hold on placing new IV. Begin working on having a BM. Up with therapy today, because we were not able to ambulate with PT yesterday will likely need hospital stay until Monday.  Care management to assist with discharge  DVT Prophylaxis - Foot Pumps and SCDs, Xarelto Weight-Bearing as tolerated to right leg  J. Horris Latino, PA-C [facility] 04/22/2018, 8:11 AM

## 2018-04-23 LAB — BASIC METABOLIC PANEL
Anion gap: 7 (ref 5–15)
BUN: 27 mg/dL — AB (ref 8–23)
CALCIUM: 8.5 mg/dL — AB (ref 8.9–10.3)
CHLORIDE: 101 mmol/L (ref 98–111)
CO2: 30 mmol/L (ref 22–32)
CREATININE: 1.18 mg/dL — AB (ref 0.44–1.00)
GFR calc Af Amer: 46 mL/min — ABNORMAL LOW (ref >60)
GFR calc non Af Amer: 40 mL/min — ABNORMAL LOW (ref >60)
Glucose, Bld: 158 mg/dL — ABNORMAL HIGH (ref 70–99)
Potassium: 5 mmol/L (ref 3.5–5.1)
Sodium: 138 mmol/L (ref 135–145)

## 2018-04-23 LAB — CBC
HCT: 25 % — ABNORMAL LOW (ref 35.0–47.0)
Hemoglobin: 7.9 g/dL — ABNORMAL LOW (ref 12.0–16.0)
MCH: 23.8 pg — ABNORMAL LOW (ref 26.0–34.0)
MCHC: 31.7 g/dL — ABNORMAL LOW (ref 32.0–36.0)
MCV: 75.2 fL — AB (ref 80.0–100.0)
PLATELETS: 222 10*3/uL (ref 150–440)
RBC: 3.33 MIL/uL — ABNORMAL LOW (ref 3.80–5.20)
RDW: 19.8 % — AB (ref 11.5–14.5)
WBC: 9.3 10*3/uL (ref 3.6–11.0)

## 2018-04-23 MED ORDER — BISACODYL 10 MG RE SUPP
10.0000 mg | Freq: Every day | RECTAL | Status: DC | PRN
  Administered 2018-04-23: 10 mg via RECTAL
  Filled 2018-04-23: qty 1

## 2018-04-23 NOTE — Progress Notes (Signed)
Pt able to sleep most of the shift. Pt family did not want her woken up if sleeping. Still having a lot of pain when turning. Pt agreeable to pain pill. purwick in place voiding without difficulty.

## 2018-04-23 NOTE — Progress Notes (Signed)
Physical Therapy Treatment Patient Details Name: Diana Powell MRN: [id number] DOB: 1926/10/23 Today's Date: 04/23/2018    History of Present Illness Diana Powell is an 84yo [place] speaking female who comes to [facility] on 10/3 for Rt hip ORIF conversion to anterior THA. PTA, pt AMB mostly in the home with [facility], frequent close calls, and 1 fall in June with Lt wrist fracture. Pt lives with family and has AIDE 5d/week from 9-3. Pt needed assistance with ADL PTA.     PT Comments    Coordinated meds with RN 90 minutes prior to session. Son continues to serve as [place] interpreter. Pt appears to tolerated ~twice ROM in limb throughout, but continues to have sudden, severe, and unpredictable. In spite of pain, patient remain motivated, and mostly participatory, but does ask that author let go of leg at times. Son asks many questions and is educated on HEP that he can assist the patient with later in day. Pt overall seems to be benefiting from pain meds this date compared to 1DA, however not as much as anticipated. Pt progressing slowly overall.           Follow Up Recommendations  SNF;Supervision/Assistance - 24 hour     Equipment Recommendations  None recommended by PT    Recommendations for Other Services       Precautions / Restrictions Precautions Precautions: Fall Restrictions RLE Weight Bearing: Weight bearing as tolerated    Mobility  Bed Mobility Overal bed mobility: (still not toelrating simple limb movement, intermittent pain detracts from motivation at times to progress movement)                Transfers                    Ambulation/Gait                 Stairs             Wheelchair Mobility    Modified Rankin (Stroke Patients Only)       Balance                                            Cognition Arousal/Alertness: Awake/alert Behavior During Therapy: WFL for tasks assessed/performed Overall Cognitive Status:  Within Functional Limits for tasks assessed                                 General Comments: some cognitive difficulty at baseline per family report, but this date pt is reporting seeing things that are not present such as a baby and a sewing machine      Exercises Total Joint Exercises Short Arc Quad: AAROM;AROM;Left;Right;10 reps(sharp intermitten tpain, pt does nto allow more than 8x on right) Heel Slides: AROM;Left;10 reps(attempted, still not tolerated on the right) Hip ABduction/ADduction: AAROM;Right;10 reps(must move slow, but poorly tolerated with sharp Right diistal anterolateral pain when adductingthe limb from an abducted position. ) Straight Leg Raises: PROM;Right;5 reps(attempts, not tolerated with knee bent or straight)    General Comments        Pertinent Vitals/Pain Pain Assessment: 0-10 Pain Score: 7  Pain Location: Right distal anterolateral thigh Pain Intervention(s): Limited activity within patient's tolerance;Premedicated before session    Home Living  Prior Function            PT Goals (current goals can now be found in the care plan section) Acute Rehab PT Goals Patient Stated Goal: to control pain PT Goal Formulation: With patient Time For Goal Achievement: 05/06/18 Potential to Achieve Goals: Poor Progress towards PT goals: Progressing toward goals    Frequency    BID      PT Plan Current plan remains appropriate    Co-evaluation              AM-PAC PT "6 Clicks" Daily Activity  Outcome Measure  Difficulty turning over in bed (including adjusting bedclothes, sheets and blankets)?: Unable Difficulty moving from lying on back to sitting on the side of the bed? : Unable Difficulty sitting down on and standing up from a chair with arms (e.g., wheelchair, bedside commode, etc,.)?: Unable Help needed moving to and from a bed to chair (including a wheelchair)?: Total Help needed walking in  hospital room?: Total Help needed climbing 3-5 steps with a railing? : Total 6 Click Score: 6    End of Session Equipment Utilized During Treatment: Oxygen Activity Tolerance: Patient limited by pain;Patient limited by fatigue Patient left: in bed;with call bell/phone within reach;with SCD's reapplied;with family/visitor present Nurse Communication: Mobility status PT Visit Diagnosis: Muscle weakness (generalized) (M62.81);Unsteadiness on feet (R26.81);Difficulty in walking, not elsewhere classified (R26.2);Pain Pain - Right/Left: Right Pain - part of body: Leg     Time: 1610-9604 PT Time Calculation (min) (ACUTE ONLY): 28 min  Charges:  $Therapeutic Exercise: 8-22 mins $Therapeutic Activity: 8-22 mins                     11:58 AM, 04/23/18 Rosamaria Lints, PT, DPT Physical Therapist - [facility]  [phone] (ASCOM)     Buccola,Allan C 04/23/2018, 11:47 AM

## 2018-04-23 NOTE — Progress Notes (Signed)
   Subjective: 3 Days Post-Op Procedure(s) (LRB): TOTAL HIP CONVERSION (Right) Patient reports pain as moderate however improved from yesterday. Confusion improved today according to son. Patient denies any N/V this AM. Denies any CP, SOB, ABD pain. Continue PT today.  Objective: Vital signs in last 24 hours: Temp:  [97.6 F (36.4 C)-98.5 F (36.9 C)] 97.8 F (36.6 C) (10/06 0741) Pulse Rate:  [69-70] 70 (10/06 0741) Resp:  [18-22] 22 (10/06 0741) BP: (131-147)/(51-64) 147/64 (10/06 0741) SpO2:  [91 %-98 %] 96 % (10/06 0741)  Intake/Output from previous day: 10/05 0701 - 10/06 0700 In: 240 [P.O.:240] Out: 200 [Urine:200] Intake/Output this shift: No intake/output data recorded.  Recent Labs    04/21/18 0647 04/21/18 1740 04/22/18 0629 04/22/18 1359  HGB 5.8* 7.9* 8.2* 7.9*   Recent Labs    04/21/18 0647 04/21/18 1740 04/22/18 0629  WBC 4.2  --  9.5  RBC 2.52*  --  3.48*  HCT 18.3* 24.7* 26.4*  PLT 184  --  214   Recent Labs    04/21/18 1740 04/22/18 0629  NA 137 138  K 4.3 4.5  CL 100 100  CO2 27 29  BUN 21 22  CREATININE 1.39* 1.39*  GLUCOSE 168* 139*  CALCIUM 8.2* 8.5*   No results for input(s): LABPT, INR in the last 72 hours.  EXAM General - Patient is Alert, Appropriate and Oriented Extremity - Neurovascular intact Sensation intact distally Intact pulses distally Dorsiflexion/Plantar flexion intact No cellulitis present Compartment soft  Moderate ecchymosis present to the right hip this AM but no purulent drainage or erythema. Dressing - Woundvac intact to the right hip, woundvac drainage bloody discharge, no purulence. Motor Function - intact, moving foot and toes well on exam.  Able to dorsiflex and plantar flex the right foot without discomfort.  Past Medical History:  Diagnosis Date  . Atrial fibrillation (HCC)   . Chronic systolic heart failure (HCC)   . COPD (chronic obstructive pulmonary disease) (HCC)   . Osteoarthritis,  multiple sites   . Osteoporosis, post-menopausal   . Presence of permanent cardiac pacemaker     Assessment/Plan:   3 Days Post-Op Procedure(s) (LRB): TOTAL HIP CONVERSION (Right) Active Problems:   Status post total hip replacement, right   Acute post op blood loss anemia   Estimated body mass index is 24.67 kg/m as calculated from the following:   Height as of this encounter: 5' (1.524 m).   Weight as of this encounter: 57.3 kg. Advance diet Up with therapy, weightbearing as tolerated   Labs reviewed this AM. Hg 7.9 this AM. Will re-order Hg for 2 pm this Afternoon and again tomorrow morning. BP 147/64 this AM continue to monitor, can restart BP medication this AM if BP remains stable. Begin working on having a BM. Current plan is for discharge to SNF. Plan for possible discharge to SNF tomorrow morning.  DVT Prophylaxis - Foot Pumps and SCDs, Xarelto Weight-Bearing as tolerated to right leg  J. Horris Latino, PA-C [facility] 04/23/2018, 9:32 AM

## 2018-04-24 ENCOUNTER — Inpatient Hospital Stay: Payer: Medicare Other

## 2018-04-24 LAB — CBC
HCT: 23.8 % — ABNORMAL LOW (ref 35.0–47.0)
HEMOGLOBIN: 7.5 g/dL — AB (ref 12.0–16.0)
MCH: 24.1 pg — ABNORMAL LOW (ref 26.0–34.0)
MCHC: 31.6 g/dL — ABNORMAL LOW (ref 32.0–36.0)
MCV: 76.2 fL — AB (ref 80.0–100.0)
Platelets: 226 10*3/uL (ref 150–440)
RBC: 3.12 MIL/uL — AB (ref 3.80–5.20)
RDW: 19.8 % — ABNORMAL HIGH (ref 11.5–14.5)
WBC: 7.9 10*3/uL (ref 3.6–11.0)

## 2018-04-24 LAB — HEMOGLOBIN AND HEMATOCRIT, BLOOD
HCT: 27.7 % — ABNORMAL LOW (ref 35.0–47.0)
Hemoglobin: 9 g/dL — ABNORMAL LOW (ref 12.0–16.0)

## 2018-04-24 LAB — BASIC METABOLIC PANEL
ANION GAP: 7 (ref 5–15)
BUN: 25 mg/dL — ABNORMAL HIGH (ref 8–23)
CHLORIDE: 100 mmol/L (ref 98–111)
CO2: 31 mmol/L (ref 22–32)
Calcium: 8.2 mg/dL — ABNORMAL LOW (ref 8.9–10.3)
Creatinine, Ser: 0.98 mg/dL (ref 0.44–1.00)
GFR calc Af Amer: 58 mL/min — ABNORMAL LOW (ref >60)
GFR calc non Af Amer: 50 mL/min — ABNORMAL LOW (ref >60)
Glucose, Bld: 120 mg/dL — ABNORMAL HIGH (ref 70–99)
Potassium: 3.9 mmol/L (ref 3.5–5.1)
SODIUM: 138 mmol/L (ref 135–145)

## 2018-04-24 LAB — TROPONIN I: Troponin I: <0.03 ng/mL (ref <0.03)

## 2018-04-24 LAB — SURGICAL PATHOLOGY

## 2018-04-24 LAB — PREPARE RBC (CROSSMATCH)

## 2018-04-24 MED ORDER — NITROGLYCERIN 0.4 MG SL SUBL
SUBLINGUAL_TABLET | SUBLINGUAL | Status: AC
  Filled 2018-04-24: qty 1

## 2018-04-24 MED ORDER — FUROSEMIDE 10 MG/ML IJ SOLN
20.0000 mg | Freq: Two times a day (BID) | INTRAMUSCULAR | Status: DC
  Administered 2018-04-24 – 2018-04-25 (×4): 20 mg via INTRAVENOUS
  Filled 2018-04-24 (×4): qty 4

## 2018-04-24 MED ORDER — ACETAMINOPHEN 500 MG PO TABS
1000.0000 mg | ORAL_TABLET | Freq: Three times a day (TID) | ORAL | Status: DC
  Administered 2018-04-24 – 2018-04-26 (×6): 1000 mg via ORAL
  Filled 2018-04-24 (×7): qty 2

## 2018-04-24 MED ORDER — SODIUM CHLORIDE 0.9% IV SOLUTION
Freq: Once | INTRAVENOUS | Status: AC
  Administered 2018-04-24: 05:00:00 via INTRAVENOUS

## 2018-04-24 MED ORDER — NITROGLYCERIN 0.4 MG SL SUBL: 0.4000 mg | SUBLINGUAL_TABLET | SUBLINGUAL | Status: DC | PRN

## 2018-04-24 MED ORDER — HYDROCODONE-ACETAMINOPHEN 5-325 MG PO TABS
1.0000 | ORAL_TABLET | Freq: Three times a day (TID) | ORAL | Status: DC | PRN
  Administered 2018-04-24 – 2018-04-26 (×6): 1 via ORAL
  Filled 2018-04-24 (×6): qty 1

## 2018-04-24 NOTE — Progress Notes (Signed)
   Subjective: 4 Days Post-Op Procedure(s) (LRB): TOTAL HIP CONVERSION (Right) Patient reports pain as mild to moderate. Patient with complaint of chest pain, single episode last night that resolved with nitro.  Chest x-ray ordered along with troponins which were negative.  Patient currently being transfused 1 unit of packed red blood cells. Denies any CP, SOB, ABD pain.    Objective: Vital signs in last 24 hours: Temp:  [97.8 F (36.6 C)-98 F (36.7 C)] 97.8 F (36.6 C) (10/07 0746) Pulse Rate:  [70] 70 (10/07 0746) Resp:  [16-22] 18 (10/07 0746) BP: (120-141)/(55-62) 141/60 (10/07 0746) SpO2:  [92 %-100 %] 98 % (10/07 0746)  Intake/Output from previous day: 10/06 0701 - 10/07 0700 In: 600 [P.O.:600] Out: 400 [Urine:400] Intake/Output this shift: No intake/output data recorded.  Recent Labs    04/21/18 1740 04/22/18 0629 04/22/18 1359 04/23/18 1433 04/24/18 0219  HGB 7.9* 8.2* 7.9* 7.9* 7.5*   Recent Labs    04/23/18 1433 04/24/18 0219  WBC 9.3 7.9  RBC 3.33* 3.12*  HCT 25.0* 23.8*  PLT 222 226   Recent Labs    04/23/18 1433 04/24/18 0219  NA 138 138  K 5.0 3.9  CL 101 100  CO2 30 31  BUN 27* 25*  CREATININE 1.18* 0.98  GLUCOSE 158* 120*  CALCIUM 8.5* 8.2*   No results for input(s): LABPT, INR in the last 72 hours.  EXAM General - Patient is Alert, Appropriate and Oriented Extremity - Neurovascular intact Sensation intact distally Intact pulses distally Dorsiflexion/Plantar flexion intact No cellulitis present Compartment soft  Compartment soft Dressing - dressing C/D/I and no drainage, wound VAC intact Motor Function - intact, moving foot and toes well on exam.   Past Medical History:  Diagnosis Date  . Atrial fibrillation (HCC)   . Chronic systolic heart failure (HCC)   . COPD (chronic obstructive pulmonary disease) (HCC)   . Osteoarthritis, multiple sites   . Osteoporosis, post-menopausal   . Presence of permanent cardiac pacemaker      Assessment/Plan:   4 Days Post-Op Procedure(s) (LRB): TOTAL HIP CONVERSION (Right) Active Problems:   Status post total hip replacement, right   Acute post op blood loss anemia   Estimated body mass index is 24.67 kg/m as calculated from the following:   Height as of this encounter: 5' (1.524 m).   Weight as of this encounter: 57.3 kg. Advance diet Up with therapy, weightbearing as tolerated   Acute post op blood loss anemia -hemoglobin 7.5. currently undergoing 1 unit blood transfusion with PRBC. continue with iron supplementation.  Vital signs stable.    Appreciate hospitalist input.  Second troponin pending.  Recheck hemoglobin post transfusion.  Care management to assist with discharge to rehab facility.   DVT Prophylaxis - Foot Pumps and SCDs, Xarelto Weight-Bearing as tolerated to right leg   T. Cranston Neighbor, PA-C [facility] 04/24/2018, 8:25 AM

## 2018-04-24 NOTE — Progress Notes (Signed)
Rapid Response Event Note  Overview:  Responded to rapid response in room 152. Pt found lying in bed on . Son at beside interpreting for pt as pt is non-english speaking. RT, [employer], RT, RR RN, and primary RN at bedside.    Initial Focused Assessment: Pt son reporting to RN's that pt is having substernal chest pain radiating into right side. Pt BP and other vitals stable at this time. Son mentions "pt feeling and being very anxious."  Interventions: House supervisor placed IV in right hand as pt had no IV access. Pt given 1 SLG Nitro per RR order set, EKG obtained, and order for stat troponin placed. Recheck of pain after after 1 Nitro pt son states "she feels no more pain".EKG showing no acute abnormality after 2 RN review.   Plan of Care (if not transferred):   Pt seems to be more comfortable without pain at this time. Troponin appears to be negative at this time. Primary RN spoke with hospitalist and updated who will now be coming by to see patient.    Event Summary:  04/24/18 at 0155    Diana Powell

## 2018-04-24 NOTE — Care Management Note (Signed)
Case Management Note  Patient Details  Name: Diana Powell MRN: [id number] Date of Birth: Nov 26, 1926  Subjective/Objective:       Admitted to [facility] with the diagnosis of status post total hip repair             Action/Plan: Received referral for Home Health and durable medical equipment needs.  Physical therapy is recommending skilled nursing facility. Referral to Clinical social Worker  Expected Discharge Date:  04/22/18               Expected Discharge Plan:     In-House Referral:   yes  Discharge planning Services   yes  Post Acute Care Choice:    Choice offered to:     DME Arranged:    DME Agency:     HH Arranged:    HH Agency:     Status of Service:     If discussed at [employer], dates discussed:    Additional Comments:  Gwenette Greet, RN MSN CCM Care Management [phone] 04/24/2018, 2:57 PM

## 2018-04-24 NOTE — Progress Notes (Signed)
Clinical Social Worker (CSW) met with patient and her son Cristie Hem was at bedside. Per Cristie Hem they have decided to accept bed offer from [facility]. CSW made Alex aware that patient will have to stay for 30 days and sign over her social security check to the facility. Alex verbalized his understanding. Per RN patient is getting a blood transfusion today and may be ready for D/C tomorrow. April admissions coordinator at [facility] is aware of above.   [employer], LCSW [phone]

## 2018-04-24 NOTE — Progress Notes (Signed)
Physical Therapy Treatment Patient Details Name: Diana Powell MRN: [id number] DOB: 1927-03-26 Today's Date: 04/24/2018    History of Present Illness Diana Powell is an 84yo [place] speaking female who comes to [facility] on 10/3 for Rt hip ORIF conversion to anterior THA. PTA, pt AMB mostly in the home with [facility], frequent close calls, and 1 fall in June with Lt wrist fracture. Pt lives with family and has AIDE 5d/week from 9-3. Pt needed assistance with ADL PTA.     PT Comments    Therapist returned as promised to assist pt in returning to bed post sitting edge of bed. Min A x 2. Plan to return for full afternoon session, as tolerated by pt; attempt standing. Continue PT to progress strength and endurance to improve all functional mobility.    Follow Up Recommendations  SNF     Equipment Recommendations  None recommended by PT    Recommendations for Other Services       Precautions / Restrictions Precautions Precautions: Fall Restrictions Weight Bearing Restrictions: Yes RLE Weight Bearing: Weight bearing as tolerated    Mobility  Bed Mobility Overal bed mobility: Needs Assistance Bed Mobility: Sit to Supine     Supine to sit: Min assist;+2 for physical assistance     General bed mobility comments: Increased time; pt requires slow gentle/careful movmement. Pt able to demonstrate ability to elevate hips from bed using UE strength to scoot hips upward in bed and allow for therapist to adjust bed clothes  Transfers                 General transfer comment: Not tested; pt feeling somewhat overwhelmed, but wishes to sit edge of bed for a while  Ambulation/Gait                 Stairs             Wheelchair Mobility    Modified Rankin (Stroke Patients Only)       Balance                                            Cognition Arousal/Alertness: Awake/alert Behavior During Therapy: WFL for tasks assessed/performed Overall Cognitive  Status: Within Functional Limits for tasks assessed                                 General Comments: Pt signed waiver for provided interpreter; son used as interpreter      Exercises Total Joint Exercises Ankle Circles/Pumps: AROM;Both;20 reps;Supine Quad Sets: Strengthening;Both;10 reps;Supine Gluteal Sets: Strengthening;Both;10 reps;Supine Short Arc Quad: AAROM;Right;10 reps;Supine(AROM L) Heel Slides: AAROM;Right;10 reps;Supine(AROM L) Hip ABduction/ADduction: AAROM;Right;10 reps;Supine(AROM L; assist only to hold leg up ) Long Arc Quad: AROM;Both;10 reps;Seated Other Exercises Other Exercises: seated tolerance    General Comments        Pertinent Vitals/Pain Pain Assessment: 0-10 Pain Score: 10-Worst pain ever(with movement/be cautious to touch distal RLE) Pain Location: R hip and distal LE Pain Descriptors / Indicators: Constant Pain Intervention(s): Limited activity within patient's tolerance;Monitored during session;Repositioned    Home Living                      Prior Function            PT Goals (current goals can now be found in  the care plan section) Progress towards PT goals: Progressing toward goals    Frequency    BID      PT Plan Current plan remains appropriate    Co-evaluation              AM-PAC PT "6 Clicks" Daily Activity  Outcome Measure  Difficulty turning over in bed (including adjusting bedclothes, sheets and blankets)?: Unable Difficulty moving from lying on back to sitting on the side of the bed? : Unable Difficulty sitting down on and standing up from a chair with arms (e.g., wheelchair, bedside commode, etc,.)?: Unable Help needed moving to and from a bed to chair (including a wheelchair)?: Total Help needed walking in hospital room?: Total Help needed climbing 3-5 steps with a railing? : Total 6 Click Score: 6    End of Session Equipment Utilized During Treatment: Oxygen Activity Tolerance:  Patient tolerated treatment well;Patient limited by fatigue;Patient limited by pain Patient left: with call bell/phone within reach;with bed alarm set;with family/visitor present   PT Visit Diagnosis: Muscle weakness (generalized) (M62.81);Unsteadiness on feet (R26.81);Difficulty in walking, not elsewhere classified (R26.2);Pain Pain - Right/Left: Right Pain - part of body: Leg     Time: 1610-9604 PT Time Calculation (min) (ACUTE ONLY): 9 min  Charges:  $Therapeutic Exercise: 23-37 mins $Therapeutic Activity: 8-22 mins                      Scot Dock, PTA 04/24/2018, 1:30 PM

## 2018-04-24 NOTE — Progress Notes (Signed)
   04/24/18 0154  Clinical Encounter Type  Visited With Patient and family together;Health care provider  Visit Type Code (rapid response)  Spiritual Encounters  Spiritual Needs Prayer   Chaplain responded to rapid response page, maintained calming presence while offering silent and energetic prayers.  Son at bedside.  When space presented, chaplain introduced self to son.  Son indicated that there was no need for chaplain presence.  Chaplain spoke of ongoing availability and encouraged son to page as needed.

## 2018-04-24 NOTE — Progress Notes (Signed)
Physical Therapy Treatment Patient Details Name: Diana Powell MRN: [id number] DOB: [birth year] Today's Date: 04/24/2018    History of Present Illness Diana Powell is an 84yo [place] speaking female who comes to [facility] on 10/3 for Rt hip ORIF conversion to anterior THA. PTA, pt AMB mostly in the home with [facility], frequent close calls, and 1 fall in June with Lt wrist fracture. Pt lives with family and has AIDE 5d/week from 9-3. Pt needed assistance with ADL PTA.     PT Comments    Pt agreeable to PT; pt signed waiver waiving use of [facility] provided interpreter. Pt wishes son to interpret. Pt pain is 7/10 RLE (careful of distal LE to the touch). Pt participates with BLE exercises. Supine to sit with Min A x 2 (move slowly). Pt tolerates increased sit time and able to do so with BUE support and supervision. Participates in LAQ exercises in sit. Pt wishes to sit for some time edge of bed, but does not wish transfer to recliner at this time. Son/pt requests return in approximately 30 min to return to bed. PT to return.    Follow Up Recommendations  SNF     Equipment Recommendations  None recommended by PT    Recommendations for Other Services       Precautions / Restrictions Precautions Precautions: Fall Restrictions Weight Bearing Restrictions: Yes RLE Weight Bearing: Weight bearing as tolerated    Mobility  Bed Mobility Overal bed mobility: Needs Assistance Bed Mobility: Supine to Sit     Supine to sit: Min assist;+2 for physical assistance     General bed mobility comments: Increased time; pt requires slow gentle/careful movmement  Transfers                 General transfer comment: Not tested; pt feeling somewhat overwhelmed, but wishes to sit edge of bed for a while  Ambulation/Gait                 Stairs             Wheelchair Mobility    Modified Rankin (Stroke Patients Only)       Balance                                             Cognition Arousal/Alertness: Awake/alert Behavior During Therapy: WFL for tasks assessed/performed Overall Cognitive Status: Within Functional Limits for tasks assessed                                 General Comments: Pt signed waiver for provided interpreter; son used as interpreter      Exercises Total Joint Exercises Ankle Circles/Pumps: AROM;Both;20 reps;Supine Quad Sets: Strengthening;Both;10 reps;Supine Gluteal Sets: Strengthening;Both;10 reps;Supine Short Arc Quad: AAROM;Right;10 reps;Supine(AROM L) Heel Slides: AAROM;Right;10 reps;Supine(AROM L) Hip ABduction/ADduction: AAROM;Right;10 reps;Supine(AROM L; assist only to hold leg up ) Long Arc Quad: AROM;Both;10 reps;Seated Other Exercises Other Exercises: seated tolerance    General Comments        Pertinent Vitals/Pain Pain Assessment: 0-10 Pain Score: 10-Worst pain ever(with movement/be cautious to touch distal RLE) Pain Location: R hip and distal LE Pain Descriptors / Indicators: Constant Pain Intervention(s): Limited activity within patient's tolerance;Monitored during session;Repositioned    Home Living  Prior Function            PT Goals (current goals can now be found in the care plan section) Progress towards PT goals: Progressing toward goals    Frequency    BID      PT Plan Current plan remains appropriate    Co-evaluation              AM-PAC PT "6 Clicks" Daily Activity  Outcome Measure  Difficulty turning over in bed (including adjusting bedclothes, sheets and blankets)?: Unable Difficulty moving from lying on back to sitting on the side of the bed? : Unable Difficulty sitting down on and standing up from a chair with arms (e.g., wheelchair, bedside commode, etc,.)?: Unable Help needed moving to and from a bed to chair (including a wheelchair)?: Total Help needed walking in hospital room?: Total Help needed climbing 3-5 steps  with a railing? : Total 6 Click Score: 6    End of Session Equipment Utilized During Treatment: Oxygen Activity Tolerance: Patient tolerated treatment well;Patient limited by fatigue;Patient limited by pain Patient left: Other (comment);with bed alarm set;with call bell/phone within reach(sit edge of bed with son in room/nsg aware;PT to return)   PT Visit Diagnosis: Muscle weakness (generalized) (M62.81);Unsteadiness on feet (R26.81);Difficulty in walking, not elsewhere classified (R26.2);Pain Pain - Right/Left: Right Pain - part of body: Leg     Time: 0950-1030 PT Time Calculation (min) (ACUTE ONLY): 40 min  Charges:  $Therapeutic Exercise: 23-37 mins $Therapeutic Activity: 8-22 mins                      Scot Dock, PTA 04/24/2018, 11:45 AM

## 2018-04-24 NOTE — Consult Note (Signed)
Reason for Consult:Chest pain Referring Physician: Dr Sherrilee Gilles is an 83 y.o. female.  HPI: The patient with past medical history of Afib s/p pacemaker placement, diastolic heart failure, and recent right hip replacement POD#4 complains of chest pain at rest. EKG shows paced rhythm unchanged from previous. The patient report pain in her "xyphoid process that radiates under her left arm and into her left shoulder/back". She was given 1 nitro tablet and the pain resolved a few minutes after nitro administration. Denies nausea or vomiting, but she admits to some shortness of breath. She was put on oxygen for comfort. Now she has SpO2 of 93% on 2L of O2 via nasal canula.   Past Medical History:  Diagnosis Date  . Atrial fibrillation ([place])   . Chronic systolic heart failure ([place])   . COPD (chronic obstructive pulmonary disease) ([place])   . Osteoarthritis, multiple sites   . Osteoporosis, post-menopausal   . Presence of permanent cardiac pacemaker     Past Surgical History:  Procedure Laterality Date  . CARDIOVERSION  ~2009  . HIP FRACTURE SURGERY Bilateral [phone]   each hip fractured at separate times  . INSERT / REPLACE / REMOVE PACEMAKER    . TOTAL HIP ARTHROPLASTY Right 04/20/2018   Procedure: TOTAL HIP CONVERSION;  Surgeon: Hessie Knows, MD;  Location: [facility] ORS;  Service: Orthopedics;  Laterality: Right;    History reviewed. No pertinent family history.  Social History:  reports that she has never smoked. She has never used smokeless tobacco. She reports that she drank alcohol. She reports that she does not use drugs.  Allergies: No Known Allergies  Medications:  I have reviewed the patient's current medications. Prior to Admission:  Medications Prior to Admission  Medication Sig Dispense Refill Last Dose  . acetaminophen (TYLENOL) 500 MG tablet Take 1,000 mg by mouth every 8 (eight) hours as needed for moderate pain.      Marland Kitchen amLODipine (NORVASC) 5 MG tablet Take 5 mg  by mouth daily.  3 04/20/2018 at 8:00  . budesonide (PULMICORT) 0.5 MG/2ML nebulizer solution Take 0.5 mg by nebulization daily.  3 04/20/2018 at 8:00  . Calcium Carbonate-Vitamin D (CALCIUM-VITAMIN D) 500-200 MG-UNIT per tablet Take 1 tablet by mouth daily.   04/19/2018 at Unknown time  . clotrimazole (LOTRIMIN) 1 % cream Apply 1 application topically daily as needed (irritation).    04/19/2018 at Unknown time  . formoterol (PERFOROMIST) 20 MCG/2ML nebulizer solution Inhale 20 mcg into the lungs every 12 (twelve) hours.   04/20/2018 at 8:00  . furosemide (LASIX) 20 MG tablet Take 40 mg by mouth daily.    Taking  . potassium chloride (K-DUR) 10 MEQ tablet Take 10 mEq by mouth daily.  3   . traMADol (ULTRAM) 50 MG tablet Take 0.5 tablets (25 mg total) by mouth every 6 (six) hours as needed. (Patient taking differently: Take 50 mg by mouth every 6 (six) hours as needed for moderate pain. ) 8 tablet 0 04/20/2018 at 8:00  . vitamin B-12 (CYANOCOBALAMIN) 1000 MCG tablet Take 1,000 mcg by mouth daily.   04/19/2018 at Unknown time  . XARELTO 15 MG TABS tablet Take 15 mg by mouth daily with supper.  2 04/16/2018  . docusate sodium (COLACE) 100 MG capsule Take 1 capsule (100 mg total) by mouth daily as needed. (Patient not taking: Reported on 04/10/2018) 30 capsule 2 Not Taking at Unknown time   Scheduled: . sodium chloride   Intravenous Once  . amLODipine  5 mg Oral Daily  . arformoterol  15 mcg Nebulization Q12H  . budesonide  0.5 mg Nebulization Daily  . calcium-vitamin D  1 tablet Oral Daily  . docusate sodium  100 mg Oral BID  . ferrous [id number] C-folic acid  1 capsule Oral TID PC  . furosemide  40 mg Oral Daily  . nitroGLYCERIN      . potassium chloride  10 mEq Oral Daily  . Rivaroxaban  15 mg Oral Q supper  . traMADol  50 mg Oral Q6H  . vitamin B-12  1,000 mcg Oral Daily   Continuous: . lactated ringers Stopped (04/20/18 1311)  . methocarbamol (ROBAXIN) IV     TLX:[id number], alum  & mag hydroxide-simeth, bisacodyl, bisacodyl, clotrimazole, diphenhydrAMINE, HYDROcodone-acetaminophen, HYDROcodone-acetaminophen, magnesium citrate, menthol-cetylpyridinium **OR** phenol, methocarbamol **OR** methocarbamol (ROBAXIN) IV, metoCLOPramide **OR** metoCLOPramide (REGLAN) injection, morphine injection, nitroGLYCERIN, ondansetron **OR** ondansetron (ZOFRAN) IV, senna-docusate, zolpidem  Results for orders placed or performed during the hospital encounter of 04/20/18 (from the past 48 hour(s))  Basic metabolic panel     Status: Abnormal   Collection Time: 04/22/18  6:29 AM  Result Value Ref Range   Sodium 138 135 - 145 mmol/L   Potassium 4.5 3.5 - 5.1 mmol/L   Chloride 100 98 - 111 mmol/L   CO2 29 22 - 32 mmol/L   Glucose, Bld 139 (H) 70 - 99 mg/dL   BUN 22 8 - 23 mg/dL   Creatinine, Ser 1.39 (H) 0.44 - 1.00 mg/dL   Calcium 8.5 (L) 8.9 - 10.3 mg/dL   GFR calc non Af Amer 33 (L) >60 mL/min   GFR calc Af Amer 38 (L) >60 mL/min    Comment: (NOTE) The eGFR has been calculated using the CKD EPI equation. This calculation has not been validated in all clinical situations. eGFR's persistently <60 mL/min signify possible Chronic Kidney Disease.    Anion gap 9 5 - 15    Comment: Performed at [facility], [address] [place], [place] [address]  CBC     Status: Abnormal   Collection Time: 04/22/18  6:29 AM  Result Value Ref Range   WBC 9.5 3.6 - 11.0 K/uL   RBC 3.48 (L) 3.80 - 5.20 MIL/uL   Hemoglobin 8.2 (L) 12.0 - 16.0 g/dL   HCT 26.4 (L) 35.0 - 47.0 %   MCV 75.8 (L) 80.0 - 100.0 fL   MCH 23.6 (L) 26.0 - 34.0 pg   MCHC 31.2 (L) 32.0 - 36.0 g/dL   RDW 19.0 (H) 11.5 - 14.5 %   Platelets 214 150 - 440 K/uL    Comment: Performed at [facility], [address] [place], [place] [address]  Hemoglobin     Status: Abnormal   Collection Time: 04/22/18  1:59 PM  Result Value Ref Range   Hemoglobin 7.9 (L) 12.0 - 16.0 g/dL    Comment: Performed at [facility], [address], [place], [place] [address]  Basic metabolic panel     Status: Abnormal   Collection Time: 04/23/18  2:33 PM  Result Value Ref Range   Sodium 138 135 - 145 mmol/L   Potassium 5.0 3.5 - 5.1 mmol/L   Chloride 101 98 - 111 mmol/L   CO2 30 22 - 32 mmol/L   Glucose, Bld 158 (H) 70 - 99 mg/dL   BUN 27 (H) 8 - 23 mg/dL   Creatinine, Ser 1.18 (H) 0.44 - 1.00 mg/dL   Calcium 8.5 (L) 8.9 - 10.3  mg/dL   GFR calc non Af Amer 40 (L) >60 mL/min   GFR calc Af Amer 46 (L) >60 mL/min    Comment: (NOTE) The eGFR has been calculated using the CKD EPI equation. This calculation has not been validated in all clinical situations. eGFR's persistently <60 mL/min signify possible Chronic Kidney Disease.    Anion gap 7 5 - 15    Comment: Performed at [facility], [address] [place], [place] [address]  CBC     Status: Abnormal   Collection Time: 04/23/18  2:33 PM  Result Value Ref Range   WBC 9.3 3.6 - 11.0 K/uL   RBC 3.33 (L) 3.80 - 5.20 MIL/uL   Hemoglobin 7.9 (L) 12.0 - 16.0 g/dL   HCT 25.0 (L) 35.0 - 47.0 %   MCV 75.2 (L) 80.0 - 100.0 fL   MCH 23.8 (L) 26.0 - 34.0 pg   MCHC 31.7 (L) 32.0 - 36.0 g/dL   RDW 19.8 (H) 11.5 - 14.5 %   Platelets 222 150 - 440 K/uL    Comment: Performed at [facility], [address] [place], [place] [address]  CBC     Status: Abnormal   Collection Time: 04/24/18  2:19 AM  Result Value Ref Range   WBC 7.9 3.6 - 11.0 K/uL   RBC 3.12 (L) 3.80 - 5.20 MIL/uL   Hemoglobin 7.5 (L) 12.0 - 16.0 g/dL   HCT 23.8 (L) 35.0 - 47.0 %   MCV 76.2 (L) 80.0 - 100.0 fL   MCH 24.1 (L) 26.0 - 34.0 pg   MCHC 31.6 (L) 32.0 - 36.0 g/dL   RDW 19.8 (H) 11.5 - 14.5 %   Platelets 226 150 - 440 K/uL    Comment: Performed at [facility], [address] [place], [place] [address]  Basic metabolic panel     Status: Abnormal   Collection Time: 04/24/18  2:19 AM  Result Value Ref Range   Sodium 138 135 - 145 mmol/L    Potassium 3.9 3.5 - 5.1 mmol/L   Chloride 100 98 - 111 mmol/L   CO2 31 22 - 32 mmol/L   Glucose, Bld 120 (H) 70 - 99 mg/dL   BUN 25 (H) 8 - 23 mg/dL   Creatinine, Ser 0.98 0.44 - 1.00 mg/dL   Calcium 8.2 (L) 8.9 - 10.3 mg/dL   GFR calc non Af Amer 50 (L) >60 mL/min   GFR calc Af Amer 58 (L) >60 mL/min    Comment: (NOTE) The eGFR has been calculated using the CKD EPI equation. This calculation has not been validated in all clinical situations. eGFR's persistently <60 mL/min signify possible Chronic Kidney Disease.    Anion gap 7 5 - 15    Comment: Performed at [facility], [address] [place], [place] [address]  Troponin I     Status: None   Collection Time: 04/24/18  2:19 AM  Result Value Ref Range   Troponin I <0.03 <0.03 ng/mL    Comment: Performed at [facility], [address] [place], [place] [address]    No results found.  Review of Systems  Constitutional: Negative for chills and fever.  HENT: Negative for sore throat and tinnitus.   Eyes: Negative for blurred vision and redness.  Respiratory: Positive for shortness of breath. Negative for cough.   Cardiovascular: Positive for chest pain. Negative for palpitations, orthopnea and PND.  Gastrointestinal: Negative for abdominal pain, diarrhea, nausea and vomiting.  Genitourinary:  Negative for dysuria, frequency and urgency.  Musculoskeletal: Negative for joint pain and myalgias.  Skin: Negative for rash.       No lesions  Neurological: Negative for speech change, focal weakness and weakness.  Endo/Heme/Allergies: Does not bruise/bleed easily.       No temperature intolerance  Psychiatric/Behavioral: Negative for depression and suicidal ideas.   Blood pressure (!) 140/55, pulse 70, temperature 97.9 F (36.6 C), temperature source Oral, resp. rate (!) 22, height 5' (1.524 m), weight 57.3 kg, SpO2 93 %. Physical Exam  Vitals reviewed. Constitutional: She is oriented to person, place, and time.  She appears well-developed and well-nourished. No distress.  HENT:  Head: Normocephalic and atraumatic.  Mouth/Throat: Oropharynx is clear and moist.  Eyes: Pupils are equal, round, and reactive to light. Conjunctivae and EOM are normal. No scleral icterus.  Neck: Normal range of motion. Neck supple. No JVD present. No tracheal deviation present. No thyromegaly present.  Cardiovascular: Normal rate, regular rhythm and normal heart sounds. Exam reveals no gallop and no friction rub.  No murmur heard. Respiratory: Effort normal and breath sounds normal.  GI: Soft. Bowel sounds are normal. She exhibits no distension. There is no tenderness.  Genitourinary:  Genitourinary Comments: Deferred  Musculoskeletal: Normal range of motion. She exhibits no edema.  Lymphadenopathy:    She has no cervical adenopathy.  Neurological: She is alert and oriented to person, place, and time. No cranial nerve deficit. She exhibits normal muscle tone.  Skin: Skin is warm and dry. No rash noted. No erythema.  Psychiatric: She has a normal mood and affect. Her behavior is normal. Judgment and thought content normal.    Assessment/Plan: This is an 84 year old female that had an episode of chest pain that resolved after 1 nitroglycerin tablet. She has no history of coronary artery disease.  1. Order chest xray 2. Hb low; transfuse 1 unit RBCs 3. Obtain second troponin 4. The patient is on therapeutic anticoagulation following hip surgery 5. Cardiology consult at the discretion of the primary team Thank you for involving [place] in the care of this patient. We will continue to follow.   Harrie Foreman 04/24/2018, 4:09 AM

## 2018-04-24 NOTE — Progress Notes (Signed)
Occupational Therapy Treatment Patient Details Name: Diana Powell MRN: [id number] DOB: 1927/04/08 Today's Date: 04/24/2018    History of present illness Chanley Mcenery is an 84yo [place] speaking female who comes to [facility] on 10/3 for Rt hip ORIF conversion to anterior THA. PTA, pt AMB mostly in the home with [facility], frequent close calls, and 1 fall in June with Lt wrist fracture. Pt lives with family and has AIDE 5d/week from 9-3. Pt needed assistance with ADL PTA.    OT comments  Pt seen for OT tx this date. Son interpreted for the pt (family signed waiver for interpreter; in chart). Pt/son instructed in compression stocking mgt and falls prevention strategies with verbal instruction (minimal translation noted during session with son telling therapist he will relay info to pt) and visual demonstration. Unclear how much pt truly comprehended from session 2/2 son's interpreting. Pt reporting no hip pain at rest but son notes pt does have pain when moving. Pt left in recliner at end of session. Will continue to progress.    Follow Up Recommendations  SNF    Equipment Recommendations       Recommendations for Other Services      Precautions / Restrictions Precautions Precautions: Fall Restrictions Weight Bearing Restrictions: Yes RLE Weight Bearing: Weight bearing as tolerated       Mobility Bed Mobility     General bed mobility comments: deferred, in recliner  Transfers          Balance                                           ADL either performed or assessed with clinical judgement   ADL Overall ADL's : Needs assistance/impaired                                             Vision Baseline Vision/History: Macular Degeneration Patient Visual Report: No change from baseline     Perception     Praxis      Cognition Arousal/Alertness: Awake/alert Behavior During Therapy: WFL for tasks assessed/performed Overall Cognitive Status:  Within Functional Limits for tasks assessed                                 General Comments: son used as interpreter, pt/family signed waiver which is in pt chart        Exercises Other Exercises Other Exercises: pt/son instructed in compression stocking mgt. Son verbalized understanding, stated "I'll relay this to my mom" Other Exercises: pt/son instructed in falls prevention and proper footwear   Shoulder Instructions       General Comments      Pertinent Vitals/ Pain       Pain Assessment: No/denies pain Pain Location: no pain at rest per pt via son Pain Descriptors / Indicators: Constant Pain Intervention(s): Monitored during session  Home Living                                          Prior Functioning/Environment              Frequency  Min 2X/week  Progress Toward Goals  OT Goals(current goals can now be found in the care plan section)  Progress towards OT goals: Progressing toward goals  Acute Rehab OT Goals Patient Stated Goal: to control pain OT Goal Formulation: With patient/family Time For Goal Achievement: 05/06/18 Potential to Achieve Goals: Good  Plan Discharge plan remains appropriate;Frequency remains appropriate    Co-evaluation                 AM-PAC PT "6 Clicks" Daily Activity     Outcome Measure   Help from another person eating meals?: None Help from another person taking care of personal grooming?: A Little Help from another person toileting, which includes using toliet, bedpan, or urinal?: A Lot Help from another person bathing (including washing, rinsing, drying)?: A Lot Help from another person to put on and taking off regular upper body clothing?: A Little Help from another person to put on and taking off regular lower body clothing?: A Lot 6 Click Score: 16    End of Session    OT Visit Diagnosis: Other abnormalities of gait and mobility (R26.89);Muscle weakness  (generalized) (M62.81);Unsteadiness on feet (R26.81);Pain Pain - Right/Left: Right Pain - part of body: Hip;Leg   Activity Tolerance Patient tolerated treatment well   Patient Left in chair;with call bell/phone within reach;with family/visitor present(RN aware chair alarm not plugged in - see previous PT session note)   Nurse Communication          Time: 1610-9604 OT Time Calculation (min): 17 min  Charges: OT General Charges $OT Visit: 1 Visit OT Treatments $Self Care/Home Management : 8-22 mins  Richrd Prime, MPH, MS, OTR/L ascom [phone] 04/24/18, 5:16 PM

## 2018-04-24 NOTE — Progress Notes (Signed)
Advanced care plan.  Purpose of the Encounter: CODE STATUS  Parties in Attendance: Patient herself and her son are healthcare power of attorney  Patient's Decision Capacity: Intact  Subjective/Patient's story: Patient is 84 year old with history of systolic CHF,, atrial fibrillation, COPD who underwent conversion of prior ORIF to total hip with removal prior deep hardware   Objective/Medical story  Discussed with the patient and sister regarding her desires for cardiac and pulmonary resuscitation.  Patient and her son would like everything to be done unless it is futile care.  They would want her to be intubated and resuscitated  Goals of care determination: Full code    CODE STATUS:  Full code  Time spent discussing advanced care planning: 16 minutes

## 2018-04-24 NOTE — Progress Notes (Signed)
Physical Therapy Treatment Patient Details Name: Diana Powell MRN: [id number] DOB: 04/19/1927 Today's Date: 04/24/2018    History of Present Illness Diana Powell is an 84yo [place] speaking female who comes to [facility] on 10/3 for Rt hip ORIF conversion to anterior THA. PTA, pt AMB mostly in the home with [facility], frequent close calls, and 1 fall in June with Lt wrist fracture. Pt lives with family and has AIDE 5d/week from 9-3. Pt needed assistance with ADL PTA.     PT Comments    Pt's son used throughout treatment session as interpreter; waiver in chart. Pt agreeable to PT; pain moderate. Improved supine to sit bed mobility to Min A this session; continues to move slow and cautiously. Pt does not wish use of rolling walker for STS transfer or to chair, but does demonstrate good ability to stand with 1 person assist. Pt also able to take several steps to chair, which was maintained close to bed. Pt set up in chair; prefers nothing under her LEs (prefers the hard surface). Although alarm pad was placed, unable to reach box to plug it in and pt prefers to keep chair placed as it is facing back wall; son ensures he will be with pt at all times. Nursing staff also made aware. Pt participates in exercises again with assist as needed on RLE. Continue PT to progress strength and range in RLE to continue to improve functional mobility.   Follow Up Recommendations  SNF     Equipment Recommendations  None recommended by PT    Recommendations for Other Services       Precautions / Restrictions Precautions Precautions: Fall Restrictions Weight Bearing Restrictions: Yes RLE Weight Bearing: Weight bearing as tolerated    Mobility  Bed Mobility Overal bed mobility: Needs Assistance Bed Mobility: Supine to Sit     Supine to sit: Min assist     General bed mobility comments: Increased time; pt requires slow gentle/careful movmement  Transfers Overall transfer level: Needs assistance Equipment  used: None(pt does not wish to use) Transfers: Sit to/from Stand Sit to Stand: Min assist         General transfer comment: Slow to rise, but steady. Support provided to BUEs, as pt holds therapists arms. Reaches well for chair arm to control sit  Ambulation/Gait Ambulation/Gait assistance: Min assist Gait Distance (Feet): 2 Feet Assistive device: None;1 person hand held assist       General Gait Details: pt holds therapist's arms to take several small steps. Tolerates well   Stairs             Wheelchair Mobility    Modified Rankin (Stroke Patients Only)       Balance                                            Cognition Arousal/Alertness: Awake/alert Behavior During Therapy: WFL for tasks assessed/performed Overall Cognitive Status: Within Functional Limits for tasks assessed                                 General Comments: Pt signed waiver for provided interpreter; son used as interpreter      Exercises Total Joint Exercises Ankle Circles/Pumps: AROM;Both;20 reps;Supine Quad Sets: Strengthening;Both;10 reps;Supine Gluteal Sets: Strengthening;Both;10 reps;Supine Heel Slides: AAROM;Right;10 reps;Supine(AROM L) Hip ABduction/ADduction: AAROM;Right;10 reps;Supine(AROM L;  assist only to hold leg up ) Long Arc Quad: AROM;Both;10 reps;Seated Other Exercises Other Exercises: seated tolerance    General Comments        Pertinent Vitals/Pain Pain Location: R hip and distal LE Pain Descriptors / Indicators: Constant    Home Living                      Prior Function            PT Goals (current goals can now be found in the care plan section) Progress towards PT goals: Progressing toward goals    Frequency    BID      PT Plan Current plan remains appropriate    Co-evaluation              AM-PAC PT "6 Clicks" Daily Activity  Outcome Measure  Difficulty turning over in bed (including  adjusting bedclothes, sheets and blankets)?: Unable Difficulty moving from lying on back to sitting on the side of the bed? : Unable Difficulty sitting down on and standing up from a chair with arms (e.g., wheelchair, bedside commode, etc,.)?: Unable Help needed moving to and from a bed to chair (including a wheelchair)?: Total Help needed walking in hospital room?: Total Help needed climbing 3-5 steps with a railing? : Total 6 Click Score: 6    End of Session Equipment Utilized During Treatment: Oxygen Activity Tolerance: Patient tolerated treatment well;Patient limited by fatigue;Patient limited by pain Patient left: Other (comment);with call bell/phone within reach;with family/visitor present;with SCD's reapplied;in chair(nsg informed of pt preference and set up) Nurse Communication: Other (comment);Mobility status(O2 saturation; pt needs, pt preference with setup) PT Visit Diagnosis: Muscle weakness (generalized) (M62.81);Unsteadiness on feet (R26.81);Difficulty in walking, not elsewhere classified (R26.2);Pain Pain - Right/Left: Right Pain - part of body: Leg     Time: 1459-1545 PT Time Calculation (min) (ACUTE ONLY): 46 min  Charges:  $Therapeutic Exercise: 8-22 mins $Therapeutic Activity: 23-37 mins                      Scot Dock, PTA 04/24/2018, 4:03 PM

## 2018-04-24 NOTE — Care Management Important Message (Signed)
Copy of signed IM left with patient and son in room.  

## 2018-04-24 NOTE — Progress Notes (Addendum)
[facility] [place] at [facility]                                                                                                                                                                                  Patient Demographics   Diana Powell, is a 84 y.o. female, DOB - 07/15/1927, ZOX:[id number]  Admit date - 04/20/2018   Admitting Physician Kennedy Bucker, MD  Outpatient Primary MD for the patient is Lollie Marrow, MD   LOS - 4  Subjective: Patient seen for chest pain earlier no further chest pain complains of shortness of breath oxygen requirement is new    Review of Systems:   CONSTITUTIONAL: No documented fever. No fatigue, weakness. No weight gain, no weight loss.  EYES: No blurry or double vision.  ENT: No tinnitus. No postnasal drip. No redness of the oropharynx.  RESPIRATORY: No cough, no wheeze, no hemoptysis.  Positive dyspnea.  CARDIOVASCULAR: No chest pain. No orthopnea. No palpitations. No syncope.  GASTROINTESTINAL: No nausea, no vomiting or diarrhea. No abdominal pain. No melena or hematochezia.  GENITOURINARY: No dysuria or hematuria.  ENDOCRINE: No polyuria or nocturia. No heat or cold intolerance.  HEMATOLOGY: No anemia. No bruising. No bleeding.  INTEGUMENTARY: No rashes. No lesions.  MUSCULOSKELETAL: No arthritis. No swelling. No gout.  NEUROLOGIC: No numbness, tingling, or ataxia. No seizure-type activity.  PSYCHIATRIC: No anxiety. No insomnia. No ADD.    Vitals:   Vitals:   04/24/18 0528 04/24/18 0746 04/24/18 0839 04/24/18 0950  BP: (!) 132/56 (!) 141/60 140/68   Pulse: 70 70 69 70  Resp: 16 18 (!) 22   Temp: 97.8 F (36.6 C) 97.8 F (36.6 C) 97.8 F (36.6 C)   TempSrc: Oral Oral Axillary   SpO2:  98% 100% 92%  Weight:      Height:        Wt Readings from Last 3 Encounters:  04/20/18 57.3 kg  04/11/18 55.3 kg  12/18/17 57.2 kg     Intake/Output Summary (Last 24 hours) at 04/24/2018 1251 Last data filed at 04/24/2018  1200 Gross per 24 hour  Intake 360 ml  Output 800 ml  Net -440 ml    Physical Exam:   GENERAL: Pleasant-appearing in no apparent distress.  HEAD, EYES, EARS, NOSE AND THROAT: Atraumatic, normocephalic. Extraocular muscles are intact. Pupils equal and reactive to light. Sclerae anicteric. No conjunctival injection. No oro-pharyngeal erythema.  NECK: Supple. There is no jugular venous distention. No bruits, no lymphadenopathy, no thyromegaly.  HEART: Regular rate and rhythm,. No murmurs, no rubs, no clicks.  LUNGS: Bilateral crackles at the bases no accessory muscle use ABDOMEN: Soft, flat, nontender, nondistended.  Has good bowel sounds. No hepatosplenomegaly appreciated.  EXTREMITIES: No evidence of any cyanosis, clubbing, or peripheral edema.  +2 pedal and radial pulses bilaterally.  NEUROLOGIC: The patient is alert, awake, and oriented x3 with no focal motor or sensory deficits appreciated bilaterally.  SKIN: Large area of bruising at the site of surgery Psych: Not anxious, depressed LN: No inguinal LN enlargement    Antibiotics   Anti-infectives (From admission, onward)   Start     Dose/Rate Route Frequency Ordered Stop   04/20/18 1800  ceFAZolin (ANCEF) IVPB 1 g/50 mL premix     1 g 100 mL/hr over 30 Minutes Intravenous Every 6 hours 04/20/18 1512 04/21/18 0640   04/20/18 1155  gentamicin (GARAMYCIN) 80 mg in sodium chloride 0.9 % 500 mL irrigation  Status:  Discontinued       As needed 04/20/18 1156 04/20/18 1329   04/20/18 0815  ceFAZolin (ANCEF) 2-4 GM/100ML-% IVPB    Note to Pharmacy:  Darrel Hoover   : cabinet override      04/20/18 0815 04/20/18 1124   04/19/18 2230  ceFAZolin (ANCEF) IVPB 2g/100 mL premix     2 g 200 mL/hr over 30 Minutes Intravenous  Once 04/19/18 2229 04/20/18 1124      Medications   Scheduled Meds: . acetaminophen  1,000 mg Oral Q8H  . amLODipine  5 mg Oral Daily  . arformoterol  15 mcg Nebulization Q12H  . budesonide  0.5 mg Nebulization  Daily  . calcium-vitamin D  1 tablet Oral Daily  . docusate sodium  100 mg Oral BID  . ferrous fumarate-b12-vitamic C-folic acid  1 capsule Oral TID PC  . furosemide  20 mg Intravenous Q12H  . nitroGLYCERIN      . potassium chloride  10 mEq Oral Daily  . Rivaroxaban  15 mg Oral Q supper  . traMADol  50 mg Oral Q6H  . vitamin B-12  1,000 mcg Oral Daily   Continuous Infusions: . lactated ringers Stopped (04/20/18 1311)  . methocarbamol (ROBAXIN) IV     PRN Meds:.alum & mag hydroxide-simeth, bisacodyl, bisacodyl, clotrimazole, diphenhydrAMINE, HYDROcodone-acetaminophen, magnesium citrate, menthol-cetylpyridinium **OR** phenol, methocarbamol **OR** methocarbamol (ROBAXIN) IV, metoCLOPramide **OR** metoCLOPramide (REGLAN) injection, morphine injection, nitroGLYCERIN, ondansetron **OR** ondansetron (ZOFRAN) IV, senna-docusate, zolpidem   Data Review:   Micro Results No results found for this or any previous visit (from the past 240 hour(s)).  Radiology Reports Dg Chest Port 1 View  Result Date: 04/24/2018 CLINICAL DATA:  Cough.  Chest pain.  Shortness of breath. EXAM: PORTABLE CHEST 1 VIEW COMPARISON:  Radiograph 10/21/2013 FINDINGS: Left-sided pacemaker with leads overlying the right atrium, ventricle, and coronary sinus. Mild cardiomegaly. Pulmonary edema interstitial opacities and Kerley B-lines. Suspect small pleural effusions. Retrocardiac opacity may be atelectasis or soft tissue attenuation due to scoliosis. No pneumothorax. Bones are under mineralized. Moderate scoliotic curvature of the thoracic spine. IMPRESSION: Cardiomegaly with pulmonary edema. Small pleural effusions suspected. Electronically Signed   By: Narda Rutherford M.D.   On: 04/24/2018 04:46   Dg Hip Operative Unilat W Or W/o Pelvis Right  Result Date: 04/20/2018 CLINICAL DATA:  Intraoperative imaging for patient undergoing a right hip replacement. EXAM: OPERATIVE RIGHT HIP (WITH PELVIS IF PERFORMED) single VIEWS  TECHNIQUE: Fluoroscopic spot image(s) were submitted for interpretation post-operatively. COMPARISON:  None. FINDINGS: Two fluoroscopic spot views of the right hip in the AP projection demonstrate a total hip arthroplasty in place. Gas in the soft tissues from surgery is noted. No acute abnormality is identified.  IMPRESSION: Intraoperative imaging for right hip replacement.  No acute finding. Electronically Signed   By: Drusilla Kanner M.D.   On: 04/20/2018 13:24   Dg Hip Unilat W Or W/o Pelvis 2-3 Views Right  Result Date: 04/20/2018 CLINICAL DATA:  Postoperative radiograph. EXAM: DG HIP (WITH OR WITHOUT PELVIS) 2-3V RIGHT COMPARISON:  None. FINDINGS: Post recent total right hip arthroplasty with normal alignment of the orthopedic hardware. Expected soft tissue emphysema and edema. Skin staples in place. Vascular calcifications are noted. IMPRESSION: Post total right hip arthroplasty without evidence of immediate complications. Electronically Signed   By: Ted Mcalpine M.D.   On: 04/20/2018 14:24     CBC Recent Labs  Lab 04/21/18 0647 04/21/18 1740 04/22/18 0629 04/22/18 1359 04/23/18 1433 04/24/18 0219 04/24/18 1035  WBC 4.2  --  9.5  --  9.3 7.9  --   HGB 5.8* 7.9* 8.2* 7.9* 7.9* 7.5* 9.0*  HCT 18.3* 24.7* 26.4*  --  25.0* 23.8* 27.7*  PLT 184  --  214  --  222 226  --   MCV 72.6*  --  75.8*  --  75.2* 76.2*  --   MCH 23.2*  --  23.6*  --  23.8* 24.1*  --   MCHC 31.9*  --  31.2*  --  31.7* 31.6*  --   RDW 17.2*  --  19.0*  --  19.8* 19.8*  --     Chemistries  Recent Labs  Lab 04/21/18 0647 04/21/18 1740 04/22/18 0629 04/23/18 1433 04/24/18 0219  NA 140 137 138 138 138  K 4.3 4.3 4.5 5.0 3.9  CL 104 100 100 101 100  CO2 29 27 29 30 31   GLUCOSE 156* 168* 139* 158* 120*  BUN 19 21 22  27* 25*  CREATININE 1.18* 1.39* 1.39* 1.18* 0.98  CALCIUM 8.1* 8.2* 8.5* 8.5* 8.2*    ------------------------------------------------------------------------------------------------------------------ estimated creatinine clearance is 30.8 mL/min (by C-G formula based on SCr of 0.98 mg/dL). ------------------------------------------------------------------------------------------------------------------ No results for input(s): HGBA1C in the last 72 hours. ------------------------------------------------------------------------------------------------------------------ No results for input(s): CHOL, HDL, LDLCALC, TRIG, CHOLHDL, LDLDIRECT in the last 72 hours. ------------------------------------------------------------------------------------------------------------------ No results for input(s): TSH, T4TOTAL, T3FREE, THYROIDAB in the last 72 hours.  Invalid input(s): FREET3 ------------------------------------------------------------------------------------------------------------------ No results for input(s): VITAMINB12, FOLATE, FERRITIN, TIBC, IRON, RETICCTPCT in the last 72 hours.  Coagulation profile Recent Labs  Lab 04/20/18 0836  INR 1.14    No results for input(s): DDIMER in the last 72 hours.  Cardiac Enzymes Recent Labs  Lab 04/24/18 0219  TROPONINI <0.03   ------------------------------------------------------------------------------------------------------------------ Invalid input(s): POCBNP    Assessment & Plan   Patient is 84 year old status post hip surgery  1.  Acute on chronic systolic CHF Discontinue orally I will give IV Lasix Monitor ins and outs  2.  Chest pain possibly due to CHF exasperation currently resolved one set of troponins negative we will repeat cardiac enzymes  3.  Essential hypertension continue Norvasc  4.  Atrial fibrillation currently on Xarelto which we will continue place patient on telemetry  5.  Acute blood loss anemia related to surgery may consider holding Xarelto in light of significant bruising     Code Status Orders  (From admission, onward)         Start     Ordered   04/20/18 1513  Full code  Continuous     04/20/18 1512        Code Status History    This patient has a current code status  but no historical code status.    Advance Directive Documentation     Most Recent Value  Type of Advance Directive  Healthcare Power of Attorney  Pre-existing out of facility DNR order (yellow form or pink MOST form)  -  "MOST" Form in Place?  -                  Lab Results  Component Value Date   PLT 226 04/24/2018     Time Spent in minutes   45mn 10am to 1045 am Greater than 50% of time spent in care coordination and counseling patient regarding the condition and plan of care.   Auburn Bilberry M.D on 04/24/2018 at 12:51 PM  Between 7am to 6pm - Pager - [phone]  After 6pm go to [email or web address] - Social research officer, government  [employer]   Office  [phone]

## 2018-04-24 NOTE — Progress Notes (Signed)
Hospitalist consult called Dr. Anne Hahn he will come see pt.

## 2018-04-24 NOTE — Progress Notes (Signed)
Pt with new onset of Chest pain. Rapid Response called. Pt received 1 dose of nitroglycerine and has instant relief in her chest pain.  Spoke with Dr. Allena Katz. He would like hospitalitis to consult.

## 2018-04-25 LAB — BASIC METABOLIC PANEL
Anion gap: 6 (ref 5–15)
BUN: 24 mg/dL — AB (ref 8–23)
CHLORIDE: 101 mmol/L (ref 98–111)
CO2: 34 mmol/L — AB (ref 22–32)
CREATININE: 0.93 mg/dL (ref 0.44–1.00)
Calcium: 8 mg/dL — ABNORMAL LOW (ref 8.9–10.3)
GFR calc Af Amer: >60 mL/min (ref >60)
GFR calc non Af Amer: 53 mL/min — ABNORMAL LOW (ref >60)
GLUCOSE: 104 mg/dL — AB (ref 70–99)
Potassium: 3.4 mmol/L — ABNORMAL LOW (ref 3.5–5.1)
SODIUM: 141 mmol/L (ref 135–145)

## 2018-04-25 LAB — URINALYSIS, COMPLETE (UACMP) WITH MICROSCOPIC
BILIRUBIN URINE: NEGATIVE
Bacteria, UA: NONE SEEN
Glucose, UA: NEGATIVE mg/dL
Hgb urine dipstick: NEGATIVE
Ketones, ur: NEGATIVE mg/dL
LEUKOCYTES UA: NEGATIVE
NITRITE: NEGATIVE
PH: 5 (ref 5.0–8.0)
Protein, ur: NEGATIVE mg/dL
SPECIFIC GRAVITY, URINE: 1.015 (ref 1.005–1.030)

## 2018-04-25 LAB — TYPE AND SCREEN
ABO/RH(D): A POS
Antibody Screen: NEGATIVE
UNIT DIVISION: 0
UNIT DIVISION: 0
Unit division: 0

## 2018-04-25 LAB — BPAM RBC
BLOOD PRODUCT EXPIRATION DATE: 201910242359
Blood Product Expiration Date: 201910242359
Blood Product Expiration Date: 201910252359
ISSUE DATE / TIME: 201910040942
ISSUE DATE / TIME: 201910070507
ISSUE DATE / TIME: 201910041240
UNIT TYPE AND RH: 6200
Unit Type and Rh: 6200
Unit Type and Rh: 6200

## 2018-04-25 LAB — CBC
HEMATOCRIT: 26.5 % — AB (ref 35.0–47.0)
HEMOGLOBIN: 8.3 g/dL — AB (ref 12.0–16.0)
MCH: 24.5 pg — AB (ref 26.0–34.0)
MCHC: 31.4 g/dL — AB (ref 32.0–36.0)
MCV: 78.1 fL — AB (ref 80.0–100.0)
Platelets: 226 10*3/uL (ref 150–440)
RBC: 3.39 MIL/uL — ABNORMAL LOW (ref 3.80–5.20)
RDW: 20.5 % — ABNORMAL HIGH (ref 11.5–14.5)
WBC: 5.1 10*3/uL (ref 3.6–11.0)

## 2018-04-25 MED ORDER — HYDROCODONE-ACETAMINOPHEN 5-325 MG PO TABS: 1.0000 | ORAL_TABLET | Freq: Three times a day (TID) | ORAL | 0 refills | Status: DC | PRN

## 2018-04-25 MED ORDER — TRAMADOL HCL 50 MG PO TABS: 50.0000 mg | ORAL_TABLET | Freq: Four times a day (QID) | ORAL | 0 refills | Status: DC | PRN

## 2018-04-25 NOTE — Progress Notes (Signed)
[facility] - [place] [facility]                                                                                                                                                                                  Patient Demographics   Diana Powell, is a 84 y.o. female, DOB - 1927/03/14, ZOX:[id number]  Admit date - 04/20/2018   Admitting Physician Kennedy Bucker, MD  Outpatient Primary MD for the patient is Lollie Marrow, MD   LOS - 5  Subjective: Was confused last night.  Breathing is improved.  Review of Systems:   CONSTITUTIONAL: No documented fever. No fatigue, weakness. No weight gain, no weight loss.  EYES: No blurry or double vision.  ENT: No tinnitus. No postnasal drip. No redness of the oropharynx.  RESPIRATORY: No cough, no wheeze, no hemoptysis.  Positive dyspnea.  CARDIOVASCULAR: No chest pain. No orthopnea. No palpitations. No syncope.  GASTROINTESTINAL: No nausea, no vomiting or diarrhea. No abdominal pain. No melena or hematochezia.  GENITOURINARY: No dysuria or hematuria.  ENDOCRINE: No polyuria or nocturia. No heat or cold intolerance.  HEMATOLOGY: No anemia. No bruising. No bleeding.  INTEGUMENTARY: No rashes. No lesions.  MUSCULOSKELETAL: No arthritis. No swelling. No gout.  NEUROLOGIC: No numbness, tingling, or ataxia. No seizure-type activity.  PSYCHIATRIC: No anxiety. No insomnia. No ADD.    Vitals:   Vitals:   04/24/18 1630 04/24/18 2346 04/25/18 0814 04/25/18 0913  BP: (!) 126/51 139/60 (!) 145/69   Pulse: 70 70 70   Resp: 18 19 16    Temp: 97.8 F (36.6 C) 97.7 F (36.5 C) (!) 97.4 F (36.3 C)   TempSrc: Oral Oral Oral   SpO2: 90% 98% 99%   Weight:    58.8 kg  Height:        Wt Readings from Last 3 Encounters:  04/25/18 58.8 kg  04/11/18 55.3 kg  12/18/17 57.2 kg     Intake/Output Summary (Last 24 hours) at 04/25/2018 1509 Last data filed at 04/25/2018 1409 Gross per 24 hour  Intake 240 ml  Output 1100 ml  Net -860 ml     Physical Exam:   GENERAL: Pleasant-appearing in no apparent distress.  HEAD, EYES, EARS, NOSE AND THROAT: Atraumatic, normocephalic. Extraocular muscles are intact. Pupils equal and reactive to light. Sclerae anicteric. No conjunctival injection. No oro-pharyngeal erythema.  NECK: Supple. There is no jugular venous distention. No bruits, no lymphadenopathy, no thyromegaly.  HEART: Regular rate and rhythm,. No murmurs, no rubs, no clicks.  LUNGS: Bilateral crackles at the bases no accessory muscle use ABDOMEN: Soft, flat, nontender, nondistended. Has good bowel sounds. No hepatosplenomegaly appreciated.  EXTREMITIES: No evidence of  any cyanosis, clubbing, or peripheral edema.  +2 pedal and radial pulses bilaterally.  NEUROLOGIC: Patient with some confusion.  SKIN: Large area of bruising at the site of surgery Psych: Not anxious, depressed LN: No inguinal LN enlargement    Antibiotics   Anti-infectives (From admission, onward)   Start     Dose/Rate Route Frequency Ordered Stop   04/20/18 1800  ceFAZolin (ANCEF) IVPB 1 g/50 mL premix     1 g 100 mL/hr over 30 Minutes Intravenous Every 6 hours 04/20/18 1512 04/21/18 0640   04/20/18 1155  gentamicin (GARAMYCIN) 80 mg in sodium chloride 0.9 % 500 mL irrigation  Status:  Discontinued       As needed 04/20/18 1156 04/20/18 1329   04/20/18 0815  ceFAZolin (ANCEF) 2-4 GM/100ML-% IVPB    Note to Pharmacy:  Darrel Hoover   : cabinet override      04/20/18 0815 04/20/18 1124   04/19/18 2230  ceFAZolin (ANCEF) IVPB 2g/100 mL premix     2 g 200 mL/hr over 30 Minutes Intravenous  Once 04/19/18 2229 04/20/18 1124      Medications   Scheduled Meds: . acetaminophen  1,000 mg Oral Q8H  . amLODipine  5 mg Oral Daily  . arformoterol  15 mcg Nebulization Q12H  . budesonide  0.5 mg Nebulization Daily  . calcium-vitamin D  1 tablet Oral Daily  . docusate sodium  100 mg Oral BID  . ferrous fumarate-b12-vitamic C-folic acid  1 capsule Oral TID  PC  . furosemide  20 mg Intravenous Q12H  . potassium chloride  10 mEq Oral Daily  . traMADol  50 mg Oral Q6H  . vitamin B-12  1,000 mcg Oral Daily   Continuous Infusions: . lactated ringers Stopped (04/20/18 1311)  . methocarbamol (ROBAXIN) IV     PRN Meds:.alum & mag hydroxide-simeth, bisacodyl, bisacodyl, clotrimazole, diphenhydrAMINE, HYDROcodone-acetaminophen, magnesium citrate, menthol-cetylpyridinium **OR** phenol, methocarbamol **OR** methocarbamol (ROBAXIN) IV, metoCLOPramide **OR** metoCLOPramide (REGLAN) injection, morphine injection, nitroGLYCERIN, ondansetron **OR** ondansetron (ZOFRAN) IV, senna-docusate, zolpidem   Data Review:   Micro Results No results found for this or any previous visit (from the past 240 hour(s)).  Radiology Reports Dg Chest Port 1 View  Result Date: 04/24/2018 CLINICAL DATA:  Cough.  Chest pain.  Shortness of breath. EXAM: PORTABLE CHEST 1 VIEW COMPARISON:  Radiograph 10/21/2013 FINDINGS: Left-sided pacemaker with leads overlying the right atrium, ventricle, and coronary sinus. Mild cardiomegaly. Pulmonary edema interstitial opacities and Kerley B-lines. Suspect small pleural effusions. Retrocardiac opacity may be atelectasis or soft tissue attenuation due to scoliosis. No pneumothorax. Bones are under mineralized. Moderate scoliotic curvature of the thoracic spine. IMPRESSION: Cardiomegaly with pulmonary edema. Small pleural effusions suspected. Electronically Signed   By: Narda Rutherford M.D.   On: 04/24/2018 04:46   Dg Hip Operative Unilat W Or W/o Pelvis Right  Result Date: 04/20/2018 CLINICAL DATA:  Intraoperative imaging for patient undergoing a right hip replacement. EXAM: OPERATIVE RIGHT HIP (WITH PELVIS IF PERFORMED) single VIEWS TECHNIQUE: Fluoroscopic spot image(s) were submitted for interpretation post-operatively. COMPARISON:  None. FINDINGS: Two fluoroscopic spot views of the right hip in the AP projection demonstrate a total hip  arthroplasty in place. Gas in the soft tissues from surgery is noted. No acute abnormality is identified. IMPRESSION: Intraoperative imaging for right hip replacement.  No acute finding. Electronically Signed   By: Drusilla Kanner M.D.   On: 04/20/2018 13:24   Dg Hip Unilat W Or W/o Pelvis 2-3 Views Right  Result Date: 04/20/2018  CLINICAL DATA:  Postoperative radiograph. EXAM: DG HIP (WITH OR WITHOUT PELVIS) 2-3V RIGHT COMPARISON:  None. FINDINGS: Post recent total right hip arthroplasty with normal alignment of the orthopedic hardware. Expected soft tissue emphysema and edema. Skin staples in place. Vascular calcifications are noted. IMPRESSION: Post total right hip arthroplasty without evidence of immediate complications. Electronically Signed   By: Ted Mcalpine M.D.   On: 04/20/2018 14:24     CBC Recent Labs  Lab 04/21/18 0647  04/22/18 0629 04/22/18 1359 04/23/18 1433 04/24/18 0219 04/24/18 1035 04/25/18 0542  WBC 4.2  --  9.5  --  9.3 7.9  --  5.1  HGB 5.8*   < > 8.2* 7.9* 7.9* 7.5* 9.0* 8.3*  HCT 18.3*   < > 26.4*  --  25.0* 23.8* 27.7* 26.5*  PLT 184  --  214  --  222 226  --  226  MCV 72.6*  --  75.8*  --  75.2* 76.2*  --  78.1*  MCH 23.2*  --  23.6*  --  23.8* 24.1*  --  24.5*  MCHC 31.9*  --  31.2*  --  31.7* 31.6*  --  31.4*  RDW 17.2*  --  19.0*  --  19.8* 19.8*  --  20.5*   < > = values in this interval not displayed.    Chemistries  Recent Labs  Lab 04/21/18 1740 04/22/18 0629 04/23/18 1433 04/24/18 0219 04/25/18 0542  NA 137 138 138 138 141  K 4.3 4.5 5.0 3.9 3.4*  CL 100 100 101 100 101  CO2 27 29 30 31  34*  GLUCOSE 168* 139* 158* 120* 104*  BUN 21 22 27* 25* 24*  CREATININE 1.39* 1.39* 1.18* 0.98 0.93  CALCIUM 8.2* 8.5* 8.5* 8.2* 8.0*   ------------------------------------------------------------------------------------------------------------------ estimated creatinine clearance is 32.9 mL/min (by C-G formula based on SCr of 0.93  mg/dL). ------------------------------------------------------------------------------------------------------------------ No results for input(s): HGBA1C in the last 72 hours. ------------------------------------------------------------------------------------------------------------------ No results for input(s): CHOL, HDL, LDLCALC, TRIG, CHOLHDL, LDLDIRECT in the last 72 hours. ------------------------------------------------------------------------------------------------------------------ No results for input(s): TSH, T4TOTAL, T3FREE, THYROIDAB in the last 72 hours.  Invalid input(s): FREET3 ------------------------------------------------------------------------------------------------------------------ No results for input(s): VITAMINB12, FOLATE, FERRITIN, TIBC, IRON, RETICCTPCT in the last 72 hours.  Coagulation profile Recent Labs  Lab 04/20/18 0836  INR 1.14    No results for input(s): DDIMER in the last 72 hours.  Cardiac Enzymes Recent Labs  Lab 04/24/18 0219 04/24/18 1311 04/24/18 1954  TROPONINI <0.03 <0.03 <0.03   ------------------------------------------------------------------------------------------------------------------ Invalid input(s): POCBNP    Assessment & Plan   Patient is 84 year old status post hip surgery  1.  Acute on chronic systolic CHF Continue IV Lasix 1 more day  2.  Chest pain possibly due to CHF exasperation currently resolved one set of troponins are negative no further chest pain  3.  Essential hypertension continue Norvasc  4.  Atrial fibrillation currently on Xarelto  Can be discharged resumed on discharge tomorrow  5.  Acute blood loss anemia related to surgery Hemoglobin stable     Code Status Orders  (From admission, onward)         Start     Ordered   04/20/18 1513  Full code  Continuous     04/20/18 1512        Code Status History    This patient has a current code status but no historical code status.     Advance Directive Documentation     Most Recent Value  Type  of Customer service manager Power of Attorney  Pre-existing out of facility DNR order (yellow form or pink MOST form)  -  "MOST" Form in Place?  -         Patient may be able to be discharged tomorrow         Lab Results  Component Value Date   PLT 226 04/25/2018     Time Spent in minutes   35 minutes Auburn Bilberry M.D on 04/25/2018 at 3:09 PM  Between 7am to 6pm - Pager - [phone]  After 6pm go to [email or web address] - Social research officer, government  [facility]   Office  [phone]

## 2018-04-25 NOTE — Progress Notes (Signed)
Physical Therapy Treatment Patient Details Name: Diana Powell MRN: [id number] DOB: Jul 24, 1926 Today's Date: 04/25/2018    History of Present Illness Diana Powell is an 84yo [place] speaking female who comes to [facility] on 10/3 for Rt hip ORIF conversion to anterior THA. PTA, pt AMB mostly in the home with [facility], frequent close calls, and 1 fall in June with Lt wrist fracture. Pt lives with family and has AIDE 5d/week from 9-3. Pt needed assistance with ADL PTA.     PT Comments    Diana Powell made modest progress toward her mobility goals.  She does continue to require min assist with bed mobility and transfers; however, she ambulated 25 ft this afternoon with constant verbal cues for proper sequencing using RW. Pt on RA throughout entire session.  SpO2 remains at or above 94% while ambulating.  However, with therapeutic exercises SpO2 87-91% with cues for proper breathing technique as pt holding her breath due to pain. Given pt's current mobility status, recommendation for SNF remains appropriate at this time.    Follow Up Recommendations  SNF     Equipment Recommendations  None recommended by PT    Recommendations for Other Services       Precautions / Restrictions Precautions Precautions: Fall Restrictions Weight Bearing Restrictions: Yes RLE Weight Bearing: Weight bearing as tolerated    Mobility  Bed Mobility Overal bed mobility: Needs Assistance Bed Mobility: Supine to Sit     Supine to sit: Min assist;HOB elevated     General bed mobility comments: Cues for sequencing and assist to elevate trunk.  Pt requires increased time and uses bed rail to pull to sitting.   Transfers Overall transfer level: Needs assistance Equipment used: Rolling walker (2 wheeled) Transfers: Sit to/from Stand Sit to Stand: Min assist         General transfer comment: Assist to boost to standing with verbal cues for proper hand placement.  Ambulation/Gait Ambulation/Gait assistance: Min  assist Gait Distance (Feet): 25 Feet Assistive device: Rolling walker (2 wheeled) Gait Pattern/deviations: Step-to pattern;Decreased step length - right;Decreased step length - left;Decreased weight shift to right;Trunk flexed Gait velocity: decreased Gait velocity interpretation: <1.31 ft/sec, indicative of household ambulator General Gait Details: Constant step by step cues for sequencing using RW.  Pt limited by pain and fatigue.  Requires intermittent assist for RW managment.    Stairs             Wheelchair Mobility    Modified Rankin (Stroke Patients Only)       Balance Overall balance assessment: Needs assistance Sitting-balance support: Single extremity supported;Feet supported Sitting balance-Leahy Scale: Poor Sitting balance - Comments: Pt relies on at least 1UE support sitting EOB   Standing balance support: Bilateral upper extremity supported;During functional activity Standing balance-Leahy Scale: Poor Standing balance comment: Pt relies on BUE support for static and dynamic activities                            Cognition Arousal/Alertness: Awake/alert Behavior During Therapy: WFL for tasks assessed/performed Overall Cognitive Status: Within Functional Limits for tasks assessed                                 General Comments: Pt signed waiver for provided interpreter; son used as interpreter      Exercises Total Joint Exercises Ankle Circles/Pumps: AROM;Both;Supine;10 reps Quad Sets: Strengthening;Both;10 reps;Supine Straight Leg  Raises: AAROM;Both;10 reps;Supine Long Arc Quad: AROM;Both;10 reps;Seated    General Comments General comments (skin integrity, edema, etc.): Pt on RA throughout entire session.  SpO2 remains at or above 94% while ambulating.  However, with therapeutic exercises SpO2 87-91% with cues for proper breathing technique as pt holding her breath due to pain. HR stable.       Pertinent Vitals/Pain Pain  Assessment: Faces Faces Pain Scale: Hurts even more Pain Location: R hip Pain Descriptors / Indicators: Aching Pain Intervention(s): Limited activity within patient's tolerance;Monitored during session;Premedicated before session    Home Living                      Prior Function            PT Goals (current goals can now be found in the care plan section) Acute Rehab PT Goals Patient Stated Goal: per son: for pt to return home at d/c as he is hopeful that her home environment will lead to improved outcomes PT Goal Formulation: With patient Time For Goal Achievement: 05/06/18 Potential to Achieve Goals: Fair Progress towards PT goals: Progressing toward goals    Frequency    BID      PT Plan Current plan remains appropriate    Co-evaluation              AM-PAC PT "6 Clicks" Daily Activity  Outcome Measure  Difficulty turning over in bed (including adjusting bedclothes, sheets and blankets)?: Unable Difficulty moving from lying on back to sitting on the side of the bed? : Unable Difficulty sitting down on and standing up from a chair with arms (e.g., wheelchair, bedside commode, etc,.)?: Unable Help needed moving to and from a bed to chair (including a wheelchair)?: A Little Help needed walking in hospital room?: A Little Help needed climbing 3-5 steps with a railing? : Total 6 Click Score: 10    End of Session Equipment Utilized During Treatment: Gait belt Activity Tolerance: Patient limited by fatigue;Patient limited by pain Patient left: with call bell/phone within reach;with family/visitor present;in chair;with chair alarm set Nurse Communication: Mobility status;Other (comment)(SpO2) PT Visit Diagnosis: Muscle weakness (generalized) (M62.81);Unsteadiness on feet (R26.81);Difficulty in walking, not elsewhere classified (R26.2);Pain Pain - Right/Left: Right Pain - part of body: Leg     Time: 1455-1529 PT Time Calculation (min) (ACUTE ONLY): 34  min  Charges:  $Gait Training: 8-22 mins $Therapeutic Exercise: 8-22 mins $Therapeutic Activity: 8-22 mins                     Encarnacion Chu PT, DPT 04/25/2018, 4:32 PM

## 2018-04-25 NOTE — Progress Notes (Signed)
Pt family attempts to transfer pt after being asked to call for help. No injury or fall. RN educates family. Will continue to monitor.

## 2018-04-25 NOTE — Progress Notes (Signed)
   Subjective: 5 Days Post-Op Procedure(s) (LRB): TOTAL HIP CONVERSION (Right) Patient denies any pain this morning.  Son states last night she had episodes of confusion and agitation.  This seems to be resolved this morning, patient is alert and oriented x3 via interpretation through son. Patient denies any chest pain or shortness of breath.  No abdominal pain.  She is had a bowel movement.      Objective: Vital signs in last 24 hours: Temp:  [97.4 F (36.3 C)-97.8 F (36.6 C)] 97.4 F (36.3 C) (10/08 0814) Pulse Rate:  [69-70] 70 (10/08 0814) Resp:  [16-22] 16 (10/08 0814) BP: (126-145)/(51-69) 145/69 (10/08 0814) SpO2:  [90 %-100 %] 99 % (10/08 0814)  Intake/Output from previous day: 10/07 0701 - 10/08 0700 In: 120 [P.O.:120] Out: 1300 [Urine:1200; Drains:100] Intake/Output this shift: No intake/output data recorded.  Recent Labs    04/22/18 1359 04/23/18 1433 04/24/18 0219 04/24/18 1035 04/25/18 0542  HGB 7.9* 7.9* 7.5* 9.0* 8.3*   Recent Labs    04/24/18 0219 04/24/18 1035 04/25/18 0542  WBC 7.9  --  5.1  RBC 3.12*  --  3.39*  HCT 23.8* 27.7* 26.5*  PLT 226  --  226   Recent Labs    04/24/18 0219 04/25/18 0542  NA 138 141  K 3.9 3.4*  CL 100 101  CO2 31 34*  BUN 25* 24*  CREATININE 0.98 0.93  GLUCOSE 120* 104*  CALCIUM 8.2* 8.0*   No results for input(s): LABPT, INR in the last 72 hours.  EXAM General - Patient is Alert, Appropriate and Oriented Extremity - Neurovascular intact Sensation intact distally Intact pulses distally Dorsiflexion/Plantar flexion intact No cellulitis present Compartment soft  Compartment soft Ecchymosis along the proximal incision site stable, soft. Dressing - dressing C/D/I and no drainage, wound VAC intact, 100 cc of drainage Motor Function - intact, moving foot and toes well on exam.   Past Medical History:  Diagnosis Date  . Atrial fibrillation (HCC)   . Chronic systolic heart failure (HCC)   . COPD  (chronic obstructive pulmonary disease) (HCC)   . Osteoarthritis, multiple sites   . Osteoporosis, post-menopausal   . Presence of permanent cardiac pacemaker     Assessment/Plan:   5 Days Post-Op Procedure(s) (LRB): TOTAL HIP CONVERSION (Right) Active Problems:   Status post total hip replacement, right   Acute post op blood loss anemia   Estimated body mass index is 24.67 kg/m as calculated from the following:   Height as of this encounter: 5' (1.524 m).   Weight as of this encounter: 57.3 kg. Advance diet Up with therapy, weightbearing as tolerated   Acute post op blood loss anemia -hemoglobin 8.3.  Status post 3 units of PRBC over the last 5 days.  Vital signs stable.    Son concerned about agitation and confusion last night.  Patient appears to be better this morning, alert and oriented x3 with no signs of agitation.  Will check urinalysis to rule out UTI.  Patient with mild underlying dementia, this could be an exacerbation due to medications and possibly sundowning  Appreciate hospitalist input.    Care management to assist with discharge to rehab facility.  Possible discharge today if hospitalist okay with discharge.  DVT Prophylaxis -SCDs, teds. Weight-Bearing as tolerated to right leg   T. Cranston Neighbor, PA-C [facility] 04/25/2018, 8:19 AM

## 2018-04-25 NOTE — Progress Notes (Signed)
Physical Therapy Treatment Patient Details Name: Diana Powell MRN: [id number] DOB: 10-02-1926 Today's Date: 04/25/2018    History of Present Illness Diana Powell is an 84yo [place] speaking female who comes to [facility] on 10/3 for Rt hip ORIF conversion to anterior THA. PTA, pt AMB mostly in the home with [facility], frequent close calls, and 1 fall in June with Lt wrist fracture. Pt lives with family and has AIDE 5d/week from 9-3. Pt needed assistance with ADL PTA.     PT Comments    Pt made good progress toward mobility goals with addition of RW.  She does, however, require min assist for bed mobility and transfers due to weakness and pain.  Pt fatigues quickly after ambulating 14 ft with RW with cues.  SNF remains most appropriate d/c plan at this time.     Follow Up Recommendations  SNF     Equipment Recommendations  None recommended by PT    Recommendations for Other Services       Precautions / Restrictions Precautions Precautions: Fall Restrictions Weight Bearing Restrictions: Yes RLE Weight Bearing: Weight bearing as tolerated    Mobility  Bed Mobility Overal bed mobility: Needs Assistance Bed Mobility: Supine to Sit     Supine to sit: Min assist;HOB elevated     General bed mobility comments: Cues for sequencing and assist to elevate trunk.  Pt requires increased time and uses bed rail to pull to sitting.   Transfers Overall transfer level: Needs assistance Equipment used: Rolling walker (2 wheeled) Transfers: Sit to/from Stand Sit to Stand: Min assist         General transfer comment: Assist to boost to standing with verbal cues for proper hand placement with each sit<>stand.  Pt stood from bed x1 and from [facility] x1.   Ambulation/Gait Ambulation/Gait assistance: Min assist Gait Distance (Feet): 14 Feet Assistive device: Rolling walker (2 wheeled) Gait Pattern/deviations: Step-to pattern;Decreased step length - right;Decreased step length - left;Decreased weight  shift to right;Trunk flexed Gait velocity: decreased Gait velocity interpretation: <1.31 ft/sec, indicative of household ambulator General Gait Details: Cues for sequencing using RW and for forward gaze.  Pt fatigues after ambulating 14 ft.    Stairs             Wheelchair Mobility    Modified Rankin (Stroke Patients Only)       Balance Overall balance assessment: Needs assistance Sitting-balance support: Single extremity supported;Feet supported Sitting balance-Leahy Scale: Poor Sitting balance - Comments: Pt relies on at least 1UE support sitting EOB   Standing balance support: Bilateral upper extremity supported;During functional activity Standing balance-Leahy Scale: Poor Standing balance comment: Pt relies on BUE support for static and dynamic activities                            Cognition Arousal/Alertness: Awake/alert Behavior During Therapy: WFL for tasks assessed/performed Overall Cognitive Status: Within Functional Limits for tasks assessed                                 General Comments: Pt signed waiver for provided interpreter; son used as interpreter      Exercises Total Joint Exercises Ankle Circles/Pumps: AROM;Both;Supine;10 reps Quad Sets: Strengthening;Both;10 reps;Supine    General Comments General comments (skin integrity, edema, etc.): SpO2 remains at or above 94% on 2L O2 throughout session      Pertinent Vitals/Pain Pain Assessment:  Faces Faces Pain Scale: Hurts even more Pain Location: R hip Pain Descriptors / Indicators: Aching Pain Intervention(s): Limited activity within patient's tolerance;Monitored during session;Repositioned;Premedicated before session    Home Living                      Prior Function            PT Goals (current goals can now be found in the care plan section) Acute Rehab PT Goals Patient Stated Goal: none stated PT Goal Formulation: With patient Time For Goal  Achievement: 05/06/18 Potential to Achieve Goals: Fair Progress towards PT goals: Progressing toward goals    Frequency    BID      PT Plan Current plan remains appropriate    Co-evaluation              AM-PAC PT "6 Clicks" Daily Activity  Outcome Measure  Difficulty turning over in bed (including adjusting bedclothes, sheets and blankets)?: Unable Difficulty moving from lying on back to sitting on the side of the bed? : Unable Difficulty sitting down on and standing up from a chair with arms (e.g., wheelchair, bedside commode, etc,.)?: Unable Help needed moving to and from a bed to chair (including a wheelchair)?: A Little Help needed walking in hospital room?: A Little Help needed climbing 3-5 steps with a railing? : Total 6 Click Score: 10    End of Session Equipment Utilized During Treatment: Gait belt;Oxygen Activity Tolerance: Patient limited by fatigue;Patient limited by pain Patient left: with call bell/phone within reach;with family/visitor present;in chair Nurse Communication: Other (comment);Mobility status(SpO2, no chair alarm) PT Visit Diagnosis: Muscle weakness (generalized) (M62.81);Unsteadiness on feet (R26.81);Difficulty in walking, not elsewhere classified (R26.2);Pain Pain - Right/Left: Right Pain - part of body: Leg     Time: 0918-1000 PT Time Calculation (min) (ACUTE ONLY): 42 min  Charges:  $Gait Training: 23-37 mins $Therapeutic Activity: 8-22 mins                     Encarnacion Chu PT, DPT 04/25/2018, 2:50 PM

## 2018-04-26 LAB — CBC
HEMATOCRIT: 32.1 % — AB (ref 36.0–46.0)
HEMOGLOBIN: 9.7 g/dL — AB (ref 12.0–15.0)
MCH: 24.5 pg — ABNORMAL LOW (ref 26.0–34.0)
MCHC: 30.2 g/dL (ref 30.0–36.0)
MCV: 81.1 fL (ref 80.0–100.0)
NRBC: 0 % (ref 0.0–0.2)
Platelets: 262 10*3/uL (ref 150–400)
RBC: 3.96 MIL/uL (ref 3.87–5.11)
RDW: 20.8 % — AB (ref 11.5–15.5)
WBC: 5.2 10*3/uL (ref 4.0–10.5)

## 2018-04-26 LAB — URINE CULTURE: Culture: NO GROWTH

## 2018-04-26 NOTE — Progress Notes (Signed)
Clinical Social Worker (CSW) met with patient's son Cristie Hem at beside to discuss D/C plan. Per son he has decided to take patient home and feels comfortable taking care of her. Per son he will provide 24/7 supervision. RN case manager aware of above. [facility] admissions coordinator at [facility] is aware of above. Please reconsult if future social work needs arise. CSW signing off.   [employer], LCSW [phone]

## 2018-04-26 NOTE — Care Management Important Message (Signed)
Important Message  Patient Details  Name: Diana Powell MRN: [id number] Date of Birth: 10/12/1926   Medicare Important Message Given:  Yes    Olegario Messier A Maile Linford 04/26/2018, 11:35 AM

## 2018-04-26 NOTE — Care Management (Signed)
Patient was to have discharged today to [facility] and son and patient have decided to discharge home with home health instead.  [facility] had received a home health referral prior to patient's surgery.  CM notified [facility] of discharge and order for physical therapy. There are no DME needs. Son to transport home

## 2018-04-26 NOTE — Progress Notes (Addendum)
   Subjective: 6 Days Post-Op Procedure(s) (LRB): TOTAL HIP CONVERSION (Right) Patient denies any pain this morning.  Son states confusion has resolved.  She made good progress of physical therapy yesterday.  Vital signs been stable.  Tolerating p.o. well.    Objective: Vital signs in last 24 hours: Temp:  [97.7 F (36.5 C)-98.8 F (37.1 C)] 97.7 F (36.5 C) (10/09 0744) Pulse Rate:  [70] 70 (10/09 0744) Resp:  [16-19] 19 (10/08 2301) BP: (122-147)/(48-70) 137/70 (10/09 0744) SpO2:  [93 %-99 %] 96 % (10/09 0744) Weight:  [58.8 kg] 58.8 kg (10/08 0913)  Intake/Output from previous day: 10/08 0701 - 10/09 0700 In: 240 [P.O.:240] Out: 700 [Urine:600; Drains:100] Intake/Output this shift: No intake/output data recorded.  Recent Labs    04/23/18 1433 04/24/18 0219 04/24/18 1035 04/25/18 0542  HGB 7.9* 7.5* 9.0* 8.3*   Recent Labs    04/24/18 0219 04/24/18 1035 04/25/18 0542  WBC 7.9  --  5.1  RBC 3.12*  --  3.39*  HCT 23.8* 27.7* 26.5*  PLT 226  --  226   Recent Labs    04/24/18 0219 04/25/18 0542  NA 138 141  K 3.9 3.4*  CL 100 101  CO2 31 34*  BUN 25* 24*  CREATININE 0.98 0.93  GLUCOSE 120* 104*  CALCIUM 8.2* 8.0*   No results for input(s): LABPT, INR in the last 72 hours.  EXAM General - Patient is Alert, Appropriate and Oriented Extremity - Neurovascular intact Sensation intact distally Intact pulses distally Dorsiflexion/Plantar flexion intact No cellulitis present Compartment soft  Compartment soft Ecchymosis along the proximal incision site stable, soft. Dressing - dressing C/D/I and no drainage, wound VAC intact, 100 cc of drainage Motor Function - intact, moving foot and toes well on exam.   Past Medical History:  Diagnosis Date  . Atrial fibrillation (HCC)   . Chronic systolic heart failure (HCC)   . COPD (chronic obstructive pulmonary disease) (HCC)   . Osteoarthritis, multiple sites   . Osteoporosis, post-menopausal   . Presence  of permanent cardiac pacemaker     Assessment/Plan:   6 Days Post-Op Procedure(s) (LRB): TOTAL HIP CONVERSION (Right) Active Problems:   Status post total hip replacement, right   Acute post op blood loss anemia   Estimated body mass index is 25.32 kg/m as calculated from the following:   Height as of this encounter: 5' (1.524 m).   Weight as of this encounter: 58.8 kg. Advance diet Up with therapy, weightbearing as tolerated   Acute post op blood loss anemia -hemoglobin 8.3.  CBC pending for this morning.  Status post 3 units of PRBC over the last 5 days.  Vital signs stable.    Confusion-urinalysis negative, patient back to baseline.   Appreciate hospitalist input.    Discharge to home with home health PT today.  DVT Prophylaxis -SCDs, teds, Xarelto. Weight-Bearing as tolerated to right leg   T. Cranston Neighbor, PA-C [facility] 04/26/2018, 8:18 AM

## 2018-04-26 NOTE — Discharge Summary (Signed)
Physician Discharge Summary  Patient ID: Diana Powell MRN: [id number] DOB/AGE: [birth year] 84 y.o.  Admit date: 04/20/2018 Discharge date: 04/26/2018  Admission Diagnoses:  post traumatic osteoarthritis of right hip.Marland Kitchen   Discharge Diagnoses: Patient Active Problem List   Diagnosis Date Noted  . 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 Atrial fibrillation (HCC)   . Chronic systolic heart failure (HCC)   . Osteoporosis, post-menopausal   . Osteoarthritis, multiple sites     Past Medical History:  Diagnosis Date  . Atrial fibrillation (HCC)   . Chronic systolic heart failure (HCC)   . COPD (chronic obstructive pulmonary disease) (HCC)   . Osteoarthritis, multiple sites   . Osteoporosis, post-menopausal   . Presence of permanent cardiac pacemaker      Transfusion: 3 units of packed red blood cells   Consultants (if any): Treatment Team:  Auburn Bilberry, MD  Discharged Condition: Improved  Hospital Course: Diana Powell is an 84 y.o. female who was admitted 04/20/2018 with a diagnosis of right hip failed hardware, hip osteoarthritis and went to the operating room on 04/20/2018 and underwent the above named procedures.    Surgeries: Procedure(s): TOTAL HIP CONVERSION on 04/20/2018 Patient tolerated the surgery well. Taken to PACU where she was stabilized and then transferred to the orthopedic floor.  Patient started on Xarelto, SCDs and TED hose.  Heels elevated on bed with rolled towels. No evidence of DVT. Negative Homan. Physical therapy started on day #1 for gait training and transfer. OT started day #1 for ADL and assisted devices.  Patient's foley was d/c on day #1.  On postop day 1 patient was found to have acute postop blood loss anemia with hemoglobin of 5.8.  She was transfused 2 units of packed red blood cells.  Vital signs are stable.  Patient was asymptomatic.   Hemoglobin was up to 7.9 and then the next day on postop day 2 hemoglobin was 8.2.  Patient did well with pain control.  On postop day 4 patient was noted to have an episode of chest pain.  Troponins were negative, labs showed slight drop in hemoglobin to 7.5.  She was transfused 1/3 unit of packed red blood cells.  Hemoglobin was up to 9.0.  Patient was given nitro for chest pain which resolved.  Chest x-ray showed mild CHF exacerbation.  On postop day 5 and 6 patient did well with physical therapy.  Patient able to ambulate 25 to 30 feet.  Son felt more comfortable taking patient home versus to a rehab facility.  Patient's hemoglobin and vital signs are stable.  Patient without any chest pain or shortness of breath.  Patient stable and ready for discharge to home with home health PT.  Implants:  Medacta Quadra 7 stem short neck, M 28 mm metal head,52 mm Mpact DM cup and liner  She was given perioperative antibiotics:  Anti-infectives (From admission, onward)   Start     Dose/Rate Route Frequency Ordered Stop   04/20/18 1800  ceFAZolin (ANCEF) IVPB 1 g/50 mL premix     1 g 100 mL/hr over 30 Minutes Intravenous Every 6 hours 04/20/18 1512 04/21/18 0640   04/20/18 1155  gentamicin (GARAMYCIN) 80 mg in sodium chloride 0.9 % 500 mL irrigation  Status:  Discontinued       As needed 04/20/18 1156 04/20/18 1329   04/20/18 0815  ceFAZolin (ANCEF) 2-4 GM/100ML-% IVPB    Note to  Pharmacy:  Darrel Hoover   : cabinet override      04/20/18 0815 04/20/18 1124   04/19/18 2230  ceFAZolin (ANCEF) IVPB 2g/100 mL premix     2 g 200 mL/hr over 30 Minutes Intravenous  Once 04/19/18 2229 04/20/18 1124    .  She was given sequential compression devices, early ambulation, and Xarelto for DVT prophylaxis.  She benefited maximally from the hospital stay and there were no complications.    Recent vital signs:  Vitals:   04/26/18 0744 04/26/18 0834  BP: 137/70   Pulse: 70   Resp:    Temp: 97.7 F (36.5 C)    SpO2: 96% 96%    Recent laboratory studies:  Lab Results  Component Value Date   HGB 9.7 (L) 04/26/2018   HGB 8.3 (L) 04/25/2018   HGB 9.0 (L) 04/24/2018   Lab Results  Component Value Date   WBC 5.2 04/26/2018   PLT 262 04/26/2018   Lab Results  Component Value Date   INR 1.14 04/20/2018   Lab Results  Component Value Date   NA 141 04/25/2018   K 3.4 (L) 04/25/2018   CL 101 04/25/2018   CO2 34 (H) 04/25/2018   BUN 24 (H) 04/25/2018   CREATININE 0.93 04/25/2018   GLUCOSE 104 (H) 04/25/2018    Discharge Medications:   Allergies as of 04/26/2018   No Known Allergies     Medication List    TAKE these medications   acetaminophen 500 MG tablet Commonly known as:  TYLENOL Take 1,000 mg by mouth every 8 (eight) hours as needed for moderate pain.   amLODipine 5 MG tablet Commonly known as:  NORVASC Take 5 mg by mouth daily.   budesonide 0.5 MG/2ML nebulizer solution Commonly known as:  PULMICORT Take 0.5 mg by nebulization daily.   calcium-vitamin D 500-200 MG-UNIT tablet Take 1 tablet by mouth daily.   clotrimazole 1 % cream Commonly known as:  LOTRIMIN Apply 1 application topically daily as needed (irritation).   docusate sodium 100 MG capsule Commonly known as:  COLACE Take 1 capsule (100 mg total) by mouth daily as needed.   formoterol 20 MCG/2ML nebulizer solution Commonly known as:  PERFOROMIST Inhale 20 mcg into the lungs every 12 (twelve) hours.   furosemide 20 MG tablet Commonly known as:  LASIX Take 40 mg by mouth daily.   HYDROcodone-acetaminophen 5-325 MG tablet Commonly known as:  NORCO/VICODIN Take 1 tablet by mouth every 8 (eight) hours as needed for moderate pain (pain score 4-6).   potassium chloride 10 MEQ tablet Commonly known as:  K-DUR Take 10 mEq by mouth daily.   traMADol 50 MG tablet Commonly known as:  ULTRAM Take 0.5 tablets (25 mg total) by mouth every 6 (six) hours as needed. What changed:    how much to  take  reasons to take this   traMADol 50 MG tablet Commonly known as:  ULTRAM Take 1 tablet (50 mg total) by mouth every 6 (six) hours as needed. What changed:  You were already taking a medication with the same name, and this prescription was added. Make sure you understand how and when to take each.   vitamin B-12 1000 MCG tablet Commonly known as:  CYANOCOBALAMIN Take 1,000 mcg by mouth daily.   XARELTO 15 MG Tabs tablet Generic drug:  Rivaroxaban Take 15 mg by mouth daily with supper.       Diagnostic Studies: Dg Chest Port 1 View  Result Date: 04/24/2018  CLINICAL DATA:  Cough.  Chest pain.  Shortness of breath. EXAM: PORTABLE CHEST 1 VIEW COMPARISON:  Radiograph 10/21/2013 FINDINGS: Left-sided pacemaker with leads overlying the right atrium, ventricle, and coronary sinus. Mild cardiomegaly. Pulmonary edema interstitial opacities and Kerley B-lines. Suspect small pleural effusions. Retrocardiac opacity may be atelectasis or soft tissue attenuation due to scoliosis. No pneumothorax. Bones are under mineralized. Moderate scoliotic curvature of the thoracic spine. IMPRESSION: Cardiomegaly with pulmonary edema. Small pleural effusions suspected. Electronically Signed   By: Narda Rutherford M.D.   On: 04/24/2018 04:46   Dg Hip Operative Unilat W Or W/o Pelvis Right  Result Date: 04/20/2018 CLINICAL DATA:  Intraoperative imaging for patient undergoing a right hip replacement. EXAM: OPERATIVE RIGHT HIP (WITH PELVIS IF PERFORMED) single VIEWS TECHNIQUE: Fluoroscopic spot image(s) were submitted for interpretation post-operatively. COMPARISON:  None. FINDINGS: Two fluoroscopic spot views of the right hip in the AP projection demonstrate a total hip arthroplasty in place. Gas in the soft tissues from surgery is noted. No acute abnormality is identified. IMPRESSION: Intraoperative imaging for right hip replacement.  No acute finding. Electronically Signed   By: Drusilla Kanner M.D.   On:  04/20/2018 13:24   Dg Hip Unilat W Or W/o Pelvis 2-3 Views Right  Result Date: 04/20/2018 CLINICAL DATA:  Postoperative radiograph. EXAM: DG HIP (WITH OR WITHOUT PELVIS) 2-3V RIGHT COMPARISON:  None. FINDINGS: Post recent total right hip arthroplasty with normal alignment of the orthopedic hardware. Expected soft tissue emphysema and edema. Skin staples in place. Vascular calcifications are noted. IMPRESSION: Post total right hip arthroplasty without evidence of immediate complications. Electronically Signed   By: Ted Mcalpine M.D.   On: 04/20/2018 14:24    Disposition: Discharge disposition: 01-Home or Self Care          Contact information for follow-up providers    Evon Slack, PA-C Follow up in 2 week(s).   Specialties:  Orthopedic Surgery, Emergency Medicine Contact information: [address] [place] [address] [phone]        [facility] up.   Specialty:  Home Health Services Why:  Home Health Physical Therapy.  Will contact you and see within 48 hours of discharge Contact information: [address] [place] [address] [phone]            Contact information for after-discharge care    Destination    [facility] On 04/22/2018.   Service:  Skilled Nursing Contact information: [address] [place] [address] [phone]                   Signed: Patience Musca 04/26/2018, 11:57 AM

## 2018-04-26 NOTE — Progress Notes (Signed)
Discharge summary reviewed with verbal understanding. 2 Rxs given upon discharge. Prevenas placed. No questions at this time.

## 2018-04-26 NOTE — Progress Notes (Signed)
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 PTA, pt AMB mostly in the home with [facility], frequent close calls, and 1 fall in June with Lt wrist fracture. Pt lives with family and has AIDE 5d/week from 9-3. Pt needed assistance with ADL PTA.     PT Comments    Pt made good progress today toward her mobility goals.  Son and pt has decided to decline SNF and to return home at d/c, thus recommendation updated to HHPT with 24/7 assist/supervision.  Son educated on how to assist pt to ensure safety in the home.  Pt ambulated 30 ft with RW, requiring less frequent cues for sequencing.  Pt limited by fatigue this session.     Follow Up Recommendations  Home health PT;Supervision/Assistance - 24 hour(Pt and son refusing SNF, preference to take pt home)     Equipment Recommendations  None recommended by PT    Recommendations for Other Services       Precautions / Restrictions Precautions Precautions: Fall Restrictions Weight Bearing Restrictions: Yes RLE Weight Bearing: Weight bearing as tolerated    Mobility  Bed Mobility Overal bed mobility: Needs Assistance Bed Mobility: Supine to Sit     Supine to sit: Min assist;HOB elevated     General bed mobility comments: Light min assist to advance RLE to EOB, otherwise pt able to perform with use of bed rail and increased time.   Transfers Overall transfer level: Needs assistance Equipment used: Rolling walker (2 wheeled) Transfers: Sit to/from Stand Sit to Stand: Min guard;Min assist         General transfer comment: Cues for proper hand placement and to scoot to EOB prior to standing.  Pt slow to rise but remains steady.  Min guard to stand, min assist for controlled descent to sit.    Ambulation/Gait Ambulation/Gait assistance: Min guard Gait Distance (Feet): 30 Feet Assistive device: Rolling walker (2 wheeled) Gait Pattern/deviations: Step-to pattern;Decreased step length - right;Decreased step length - left;Decreased weight shift to right;Trunk flexed Gait velocity: decreased Gait velocity interpretation: <1.31 ft/sec, indicative of household ambulator General Gait Details: Intermittent cues for sequending using RW.  Pt limited by RLE fatigue.     Stairs             Wheelchair Mobility    Modified Rankin (Stroke Patients Only)       Balance Overall balance assessment: Needs assistance Sitting-balance support: Feet supported;No upper extremity supported Sitting balance-Leahy Scale: Fair Sitting balance - Comments: Pt able to sit EOB and perform LAQ without UE support.    Standing balance support: Bilateral upper extremity supported;During functional activity Standing balance-Leahy Scale: Poor Standing balance comment: Pt relies on BUE support for static and dynamic activities                            Cognition Arousal/Alertness: Awake/alert Behavior During Therapy: WFL for tasks assessed/performed Overall Cognitive Status: Within Functional Limits for tasks assessed                                 General Comments: Pt signed waiver for provided interpreter; son used as interpreter      Exercises Total Joint  Exercises Ankle Circles/Pumps: AROM;Both;Supine;10 reps Quad Sets: Strengthening;Both;10 reps;Supine Straight Leg Raises: AAROM;Supine;Right;5 reps Long Arc Quad: AROM;Both;Seated;15 reps    General Comments General comments (skin integrity, edema, etc.): Encouraged son to purchase gait belt and educated pt in how to assist pt with bed mobility, transfers, ambulation.  Emphasized suggestion of 24/7 assist/supervision which the son confirmed he will be able to provide.       Pertinent Vitals/Pain Pain  Assessment: Faces Faces Pain Scale: Hurts whole lot Pain Location: R hip Pain Descriptors / Indicators: Aching Pain Intervention(s): Limited activity within patient's tolerance;Monitored during session;Repositioned;Premedicated before session    Home Living                      Prior Function            PT Goals (current goals can now be found in the care plan section) Acute Rehab PT Goals Patient Stated Goal: per son: for pt to return home at d/c as he is hopeful that her home environment will lead to improved outcomes PT Goal Formulation: With patient Time For Goal Achievement: 05/06/18 Potential to Achieve Goals: Fair Progress towards PT goals: Progressing toward goals    Frequency    BID      PT Plan Current plan remains appropriate    Co-evaluation              AM-PAC PT "6 Clicks" Daily Activity  Outcome Measure  Difficulty turning over in bed (including adjusting bedclothes, sheets and blankets)?: Unable Difficulty moving from lying on back to sitting on the side of the bed? : Unable Difficulty sitting down on and standing up from a chair with arms (e.g., wheelchair, bedside commode, etc,.)?: Unable Help needed moving to and from a bed to chair (including a wheelchair)?: A Little Help needed walking in hospital room?: A Little Help needed climbing 3-5 steps with a railing? : Total 6 Click Score: 10    End of Session Equipment Utilized During Treatment: Gait belt Activity Tolerance: Patient limited by fatigue Patient left: with family/visitor present;in chair;with nursing/sitter in room;Other (comment)(NT assisting pt to [facility]) Nurse Communication: Mobility status PT Visit Diagnosis: Muscle weakness (generalized) (M62.81);Unsteadiness on feet (R26.81);Difficulty in walking, not elsewhere classified (R26.2);Pain Pain - Right/Left: Right Pain - part of body: Leg     Time: 1610-9604 PT Time Calculation (min) (ACUTE ONLY): 31 min  Charges:   $Gait Training: 8-22 mins $Therapeutic Exercise: 8-22 mins                     Encarnacion Chu PT, DPT 04/26/2018, 11:56 AM

## 2018-04-26 NOTE — Discharge Instructions (Signed)
ANTERIOR APPROACH TOTAL HIP REPLACEMENT POSTOPERATIVE DIRECTIONS   Hip Rehabilitation, Guidelines Following Surgery  The results of a hip operation are greatly improved after range of motion and muscle strengthening exercises. Follow all safety measures which are given to protect your hip. If any of these exercises cause increased pain or swelling in your joint, decrease the amount until you are comfortable again. Then slowly increase the exercises. Call your caregiver if you have problems or questions.   HOME CARE INSTRUCTIONS  Remove items at home which could result in a fall. This includes throw rugs or furniture in walking pathways.   ICE to the affected hip every three hours for 30 minutes at a time and then as needed for pain and swelling.  Continue to use ice on the hip for pain and swelling from surgery. You may notice swelling that will progress down to the foot and ankle.  This is normal after surgery.  Elevate the leg when you are not up walking on it.    Continue to use the breathing machine which will help keep your temperature down.  It is common for your temperature to cycle up and down following surgery, especially at night when you are not up moving around and exerting yourself.  The breathing machine keeps your lungs expanded and your temperature down.  Do not place pillow under knee, focus on keeping the knee straight while resting  DIET You may resume your previous home diet once your are discharged from the hospital.  DRESSING / WOUND CARE / SHOWERING Please remove provena negative pressure dressing on 05/03/2018 and apply honey comb dressing. Keep dressing clean and dry at all times.  ACTIVITY Walk with your walker as instructed. Use walker as long as suggested by your caregivers. Avoid periods of inactivity such as sitting longer than an hour when not asleep. This helps prevent blood clots.  You may resume a sexual relationship in one month or when given the OK by  your doctor.  You may return to work once you are cleared by your doctor.  Do not drive a car for 6 weeks or until released by you surgeon.  Do not drive while taking narcotics.  WEIGHT BEARING Weight bearing as tolerated. Use walker/cane as needed for at least 4 weeks post op.  POSTOPERATIVE CONSTIPATION PROTOCOL Constipation - defined medically as fewer than three stools per week and severe constipation as less than one stool per week.  One of the most common issues patients have following surgery is constipation.  Even if you have a regular bowel pattern at home, your normal regimen is likely to be disrupted due to multiple reasons following surgery.  Combination of anesthesia, postoperative narcotics, change in appetite and fluid intake all can affect your bowels.  In order to avoid complications following surgery, here are some recommendations in order to help you during your recovery period.  Colace (docusate) - Pick up an over-the-counter form of Colace or another stool softener and take twice a day as long as you are requiring postoperative pain medications.  Take with a full glass of water daily.  If you experience loose stools or diarrhea, hold the colace until you stool forms back up.  If your symptoms do not get better within 1 week or if they get worse, check with your doctor.  Dulcolax (bisacodyl) - Pick up over-the-counter and take as directed by the product packaging as needed to assist with the movement of your bowels.  Take with a full  glass of water.  Use this product as needed if not relieved by Colace only.  ° °MiraLax (polyethylene glycol) - Pick up over-the-counter to have on hand.  MiraLax is a solution that will increase the amount of water in your bowels to assist with bowel movements.  Take as directed and can mix with a glass of water, juice, soda, coffee, or tea.  Take if you go more than two days without a movement. °Do not use MiraLax more than once per day. Call your  doctor if you are still constipated or irregular after using this medication for 7 days in a row. ° °If you continue to have problems with postoperative constipation, please contact the office for further assistance and recommendations.  If you experience "the worst abdominal pain ever" or develop nausea or vomiting, please contact the office immediatly for further recommendations for treatment. ° °ITCHING ° If you experience itching with your medications, try taking only a single pain pill, or even half a pain pill at a time.  You can also use Benadryl over the counter for itching or also to help with sleep.  ° °TED HOSE STOCKINGS °Wear the elastic stockings on both legs for six weeks following surgery during the day but you may remove then at night for sleeping. ° °MEDICATIONS °See your medication summary on the “After Visit Summary” that the nursing staff will review with you prior to discharge.  You may have some home medications which will be placed on hold until you complete the course of blood thinner medication.  It is important for you to complete the blood thinner medication as prescribed by your surgeon.  Continue your approved medications as instructed at time of discharge. ° °PRECAUTIONS °If you experience chest pain or shortness of breath - call 911 immediately for transfer to the hospital emergency department.  °If you develop a fever greater that 101 F, purulent drainage from wound, increased redness or drainage from wound, foul odor from the wound/dressing, or calf pain - CONTACT YOUR SURGEON.   °                                                °FOLLOW-UP APPOINTMENTS °Make sure you keep all of your appointments after your operation with your surgeon and caregivers. You should call the office at the above phone number and make an appointment for approximately two weeks after the date of your surgery or on the date instructed by your surgeon outlined in the "After Visit Summary". ° °RANGE OF MOTION  AND STRENGTHENING EXERCISES  °These exercises are designed to help you keep full movement of your hip joint. Follow your caregiver's or physical therapist's instructions. Perform all exercises about fifteen times, three times per day or as directed. Exercise both hips, even if you have had only one joint replacement. These exercises can be done on a training (exercise) mat, on the floor, on a table or on a bed. Use whatever works the best and is most comfortable for you. Use music or television while you are exercising so that the exercises are a pleasant break in your day. This will make your life better with the exercises acting as a break in routine you can look forward to.  °Lying on your back, slowly slide your foot toward your buttocks, raising your knee up off the floor. Then slowly   slide your foot back down until your leg is straight again.  °Lying on your back spread your legs as far apart as you can without causing discomfort.  °Lying on your side, raise your upper leg and foot straight up from the floor as far as is comfortable. Slowly lower the leg and repeat.  °Lying on your back, tighten up the muscle in the front of your thigh (quadriceps muscles). You can do this by keeping your leg straight and trying to raise your heel off the floor. This helps strengthen the largest muscle supporting your knee.  °Lying on your back, tighten up the muscles of your buttocks both with the legs straight and with the knee bent at a comfortable angle while keeping your heel on the floor.  ° °IF YOU ARE TRANSFERRED TO A SKILLED REHAB FACILITY °If the patient is transferred to a skilled rehab facility following release from the hospital, a list of the current medications will be sent to the facility for the patient to continue.  When discharged from the skilled rehab facility, please have the facility set up the patient's Home Health Physical Therapy prior to being released. Also, the skilled facility will be responsible  for providing the patient with their medications at time of release from the facility to include their pain medication, the muscle relaxants, and their blood thinner medication. If the patient is still at the rehab facility at time of the two week follow up appointment, the skilled rehab facility will also need to assist the patient in arranging follow up appointment in our office and any transportation needs. ° °MAKE SURE YOU:  °Understand these instructions.  °Get help right away if you are not doing well or get worse.  ° ° °Pick up stool softner and laxative for home use following surgery while on pain medications. °Continue to use ice for pain and swelling after surgery. °Do not use any lotions or creams on the incision until instructed by your surgeon. ° °

## 2018-04-26 NOTE — Progress Notes (Signed)
[employer] -  at [facility]                                                                                                                                                                                  Patient Demographics   Diana Powell, is a 84 y.o. female, DOB - 03/02/1927, ZOX:[id number]  Admit date - 04/20/2018   Admitting Physician Kennedy Bucker, MD  Outpatient Primary MD for the patient is Lollie Marrow, MD   LOS - 6  Subjective: Patient doing much better since denies any complaint shortness of breath resolved  Review of Systems:   CONSTITUTIONAL: No documented fever. No fatigue, weakness. No weight gain, no weight loss.  EYES: No blurry or double vision.  ENT: No tinnitus. No postnasal drip. No redness of the oropharynx.  RESPIRATORY: No cough, no wheeze, no hemoptysis.  Positive dyspnea.  CARDIOVASCULAR: No chest pain. No orthopnea. No palpitations. No syncope.  GASTROINTESTINAL: No nausea, no vomiting or diarrhea. No abdominal pain. No melena or hematochezia.  GENITOURINARY: No dysuria or hematuria.  ENDOCRINE: No polyuria or nocturia. No heat or cold intolerance.  HEMATOLOGY: No anemia. No bruising. No bleeding.  INTEGUMENTARY: No rashes. No lesions.  MUSCULOSKELETAL: No arthritis. No swelling. No gout.  NEUROLOGIC: No numbness, tingling, or ataxia. No seizure-type activity.  PSYCHIATRIC: No anxiety. No insomnia. No ADD.    Vitals:   Vitals:   04/25/18 2301 04/26/18 0744 04/26/18 0818 04/26/18 0834  BP: (!) 147/62 137/70    Pulse: 70 70    Resp: 19     Temp: 98.8 F (37.1 C) 97.7 F (36.5 C)    TempSrc: Axillary Axillary    SpO2: 93% 96%  96%  Weight:   63.1 kg   Height:        Wt Readings from Last 3 Encounters:  04/26/18 63.1 kg  04/11/18 55.3 kg  12/18/17 57.2 kg     Intake/Output Summary (Last 24 hours) at 04/26/2018 1146 Last data filed at 04/26/2018 0900 Gross per 24 hour  Intake 240 ml  Output 700 ml  Net -460 ml     Physical Exam:   GENERAL: Pleasant-appearing in no apparent distress.  HEAD, EYES, EARS, NOSE AND THROAT: Atraumatic, normocephalic. Extraocular muscles are intact. Pupils equal and reactive to light. Sclerae anicteric. No conjunctival injection. No oro-pharyngeal erythema.  NECK: Supple. There is no jugular venous distention. No bruits, no lymphadenopathy, no thyromegaly.  HEART: Regular rate and rhythm,. No murmurs, no rubs, no clicks.  LUNGS: Bilateral crackles at the bases no accessory muscle use ABDOMEN: Soft, flat, nontender, nondistended. Has good bowel sounds. No hepatosplenomegaly appreciated.  EXTREMITIES: No evidence of any  cyanosis, clubbing, or peripheral edema.  +2 pedal and radial pulses bilaterally.  NEUROLOGIC: Patient with some confusion.  SKIN: Large area of bruising at the site of surgery Psych: Not anxious, depressed LN: No inguinal LN enlargement    Antibiotics   Anti-infectives (From admission, onward)   Start     Dose/Rate Route Frequency Ordered Stop   04/20/18 1800  ceFAZolin (ANCEF) IVPB 1 g/50 mL premix     1 g 100 mL/hr over 30 Minutes Intravenous Every 6 hours 04/20/18 1512 04/21/18 0640   04/20/18 1155  gentamicin (GARAMYCIN) 80 mg in sodium chloride 0.9 % 500 mL irrigation  Status:  Discontinued       As needed 04/20/18 1156 04/20/18 1329   04/20/18 0815  ceFAZolin (ANCEF) 2-4 GM/100ML-% IVPB    Note to Pharmacy:  Darrel Hoover   : cabinet override      04/20/18 0815 04/20/18 1124   04/19/18 2230  ceFAZolin (ANCEF) IVPB 2g/100 mL premix     2 g 200 mL/hr over 30 Minutes Intravenous  Once 04/19/18 2229 04/20/18 1124      Medications   Scheduled Meds: . acetaminophen  1,000 mg Oral Q8H  . amLODipine  5 mg Oral Daily  . arformoterol  15 mcg Nebulization Q12H  . budesonide  0.5 mg Nebulization Daily  . calcium-vitamin D  1 tablet Oral Daily  . docusate sodium  100 mg Oral BID  . ferrous fumarate-b12-vitamic C-folic acid  1 capsule Oral TID  PC  . furosemide  20 mg Intravenous Q12H  . potassium chloride  10 mEq Oral Daily  . traMADol  50 mg Oral Q6H  . vitamin B-12  1,000 mcg Oral Daily   Continuous Infusions: . lactated ringers Stopped (04/20/18 1311)  . methocarbamol (ROBAXIN) IV     PRN Meds:.alum & mag hydroxide-simeth, bisacodyl, bisacodyl, clotrimazole, diphenhydrAMINE, HYDROcodone-acetaminophen, magnesium citrate, menthol-cetylpyridinium **OR** phenol, methocarbamol **OR** methocarbamol (ROBAXIN) IV, metoCLOPramide **OR** metoCLOPramide (REGLAN) injection, morphine injection, nitroGLYCERIN, ondansetron **OR** ondansetron (ZOFRAN) IV, senna-docusate, zolpidem   Data Review:   Micro Results Recent Results (from the past 240 hour(s))  Urine culture     Status: None   Collection Time: 04/25/18 11:03 AM  Result Value Ref Range Status   Specimen Description   Final    URINE, RANDOM Performed at [facility], [address], [place], [place] [address]    Special Requests   Final    NONE Performed at [facility], [address], [place], [place] [address]    Culture   Final    NO GROWTH Performed at [facility], [address], [place], [place] [address]    Report Status 04/26/2018 FINAL  Final    Radiology Reports Dg Chest Port 1 View  Result Date: 04/24/2018 CLINICAL DATA:  Cough.  Chest pain.  Shortness of breath. EXAM: PORTABLE CHEST 1 VIEW COMPARISON:  Radiograph 10/21/2013 FINDINGS: Left-sided pacemaker with leads overlying the right atrium, ventricle, and coronary sinus. Mild cardiomegaly. Pulmonary edema interstitial opacities and Kerley B-lines. Suspect small pleural effusions. Retrocardiac opacity may be atelectasis or soft tissue attenuation due to scoliosis. No pneumothorax. Bones are under mineralized. Moderate scoliotic curvature of the thoracic spine. IMPRESSION: Cardiomegaly with pulmonary edema. Small pleural effusions suspected. Electronically Signed   By: Narda Rutherford M.D.   On: 04/24/2018 04:46   Dg Hip Operative Unilat W Or W/o Pelvis Right  Result Date: 04/20/2018 CLINICAL DATA:  Intraoperative imaging for patient undergoing a right hip  replacement. EXAM: OPERATIVE RIGHT HIP (WITH PELVIS IF PERFORMED) single VIEWS TECHNIQUE: Fluoroscopic spot image(s) were submitted for interpretation post-operatively. COMPARISON:  None. FINDINGS: Two fluoroscopic spot views of the right hip in the AP projection demonstrate a total hip arthroplasty in place. Gas in the soft tissues from surgery is noted. No acute abnormality is identified. IMPRESSION: Intraoperative imaging for right hip replacement.  No acute finding. Electronically Signed   By: Drusilla Kanner M.D.   On: 04/20/2018 13:24   Dg Hip Unilat W Or W/o Pelvis 2-3 Views Right  Result Date: 04/20/2018 CLINICAL DATA:  Postoperative radiograph. EXAM: DG HIP (WITH OR WITHOUT PELVIS) 2-3V RIGHT COMPARISON:  None. FINDINGS: Post recent total right hip arthroplasty with normal alignment of the orthopedic hardware. Expected soft tissue emphysema and edema. Skin staples in place. Vascular calcifications are noted. IMPRESSION: Post total right hip arthroplasty without evidence of immediate complications. Electronically Signed   By: Ted Mcalpine M.D.   On: 04/20/2018 14:24     CBC Recent Labs  Lab 04/22/18 0629  04/23/18 1433 04/24/18 0219 04/24/18 1035 04/25/18 0542 04/26/18 0830  WBC 9.5  --  9.3 7.9  --  5.1 5.2  HGB 8.2*   < > 7.9* 7.5* 9.0* 8.3* 9.7*  HCT 26.4*  --  25.0* 23.8* 27.7* 26.5* 32.1*  PLT 214  --  222 226  --  226 262  MCV 75.8*  --  75.2* 76.2*  --  78.1* 81.1  MCH 23.6*  --  23.8* 24.1*  --  24.5* 24.5*  MCHC 31.2*  --  31.7* 31.6*  --  31.4* 30.2  RDW 19.0*  --  19.8* 19.8*  --  20.5* 20.8*   < > = values in this interval not displayed.    Chemistries  Recent Labs  Lab 04/21/18 1740 04/22/18 0629 04/23/18 1433 04/24/18 0219 04/25/18 0542  NA 137 138 138 138 141  K 4.3  4.5 5.0 3.9 3.4*  CL 100 100 101 100 101  CO2 27 29 30 31  34*  GLUCOSE 168* 139* 158* 120* 104*  BUN 21 22 27* 25* 24*  CREATININE 1.39* 1.39* 1.18* 0.98 0.93  CALCIUM 8.2* 8.5* 8.5* 8.2* 8.0*   ------------------------------------------------------------------------------------------------------------------ estimated creatinine clearance is 34 mL/min (by C-G formula based on SCr of 0.93 mg/dL). ------------------------------------------------------------------------------------------------------------------ No results for input(s): HGBA1C in the last 72 hours. ------------------------------------------------------------------------------------------------------------------ No results for input(s): CHOL, HDL, LDLCALC, TRIG, CHOLHDL, LDLDIRECT in the last 72 hours. ------------------------------------------------------------------------------------------------------------------ No results for input(s): TSH, T4TOTAL, T3FREE, THYROIDAB in the last 72 hours.  Invalid input(s): FREET3 ------------------------------------------------------------------------------------------------------------------ No results for input(s): VITAMINB12, FOLATE, FERRITIN, TIBC, IRON, RETICCTPCT in the last 72 hours.  Coagulation profile Recent Labs  Lab 04/20/18 0836  INR 1.14    No results for input(s): DDIMER in the last 72 hours.  Cardiac Enzymes Recent Labs  Lab 04/24/18 0219 04/24/18 1311 04/24/18 1954  TROPONINI <0.03 <0.03 <0.03   ------------------------------------------------------------------------------------------------------------------ Invalid input(s): POCBNP    Assessment & Plan   Patient is 84 year old status post hip surgery  1.  Acute on chronic systolic CHF Now resolved Patient and son instructed to monitor for CHF  Son reports that he has been following her weight and follows instruction per her cardiologist  2.  Chest pain possibly due to CHF exasperation currently  resolved one set of troponins are negative no further chest pain  3.  Essential hypertension continue Norvasc  4.  Atrial fibrillation Xarelto resumed  5.  Acute blood loss anemia related  to surgery Hemoglobin stable  Okay to discharge from medical standpoint     Code Status Orders  (From admission, onward)         Start     Ordered   04/20/18 1513  Full code  Continuous     04/20/18 1512        Code Status History    This patient has a current code status but no historical code status.    Advance Directive Documentation     Most Recent Value  Type of Advance Directive  Healthcare Power of Attorney  Pre-existing out of facility DNR order (yellow form or pink MOST form)  -  "MOST" Form in Place?  -                 Lab Results  Component Value Date   PLT 262 04/26/2018     Time Spent in minutes   25 minutes Auburn Bilberry M.D on 04/26/2018 at 11:46 AM  Between 7am to 6pm - Pager - [phone]  After 6pm go to [email or web address] - Social research officer, government  [employer]   Office  [phone]

## 2018-04-28 ENCOUNTER — Emergency Department: Payer: Medicare Other

## 2018-04-28 ENCOUNTER — Other Ambulatory Visit: Payer: Self-pay

## 2018-04-28 ENCOUNTER — Encounter: Payer: Self-pay | Admitting: *Deleted

## 2018-04-28 ENCOUNTER — Observation Stay
Admission: EM | Admit: 2018-04-28 | Discharge: 2018-05-01 | Disposition: A | Discharge status: Home Health | Payer: Medicare Other | Attending: Internal Medicine | Admitting: Internal Medicine

## 2018-04-28 DIAGNOSIS — I824Z1 Acute embolism and thrombosis of unspecified deep veins of right distal lower extremity: Secondary | ICD-10-CM | POA: Diagnosis not present

## 2018-04-28 DIAGNOSIS — M25551 Pain in right hip: Secondary | ICD-10-CM

## 2018-04-28 DIAGNOSIS — I480 Paroxysmal atrial fibrillation: Secondary | ICD-10-CM | POA: Diagnosis not present

## 2018-04-28 DIAGNOSIS — Z96641 Presence of right artificial hip joint: Secondary | ICD-10-CM | POA: Diagnosis not present

## 2018-04-28 DIAGNOSIS — J449 Chronic obstructive pulmonary disease, unspecified: Secondary | ICD-10-CM | POA: Diagnosis not present

## 2018-04-28 DIAGNOSIS — I824Y1 Acute embolism and thrombosis of unspecified deep veins of right proximal lower extremity: Secondary | ICD-10-CM

## 2018-04-28 DIAGNOSIS — I5022 Chronic systolic (congestive) heart failure: Secondary | ICD-10-CM | POA: Insufficient documentation

## 2018-04-28 DIAGNOSIS — I482 Chronic atrial fibrillation, unspecified: Secondary | ICD-10-CM | POA: Insufficient documentation

## 2018-04-28 DIAGNOSIS — Z79899 Other long term (current) drug therapy: Secondary | ICD-10-CM | POA: Insufficient documentation

## 2018-04-28 DIAGNOSIS — Z7901 Long term (current) use of anticoagulants: Secondary | ICD-10-CM | POA: Diagnosis not present

## 2018-04-28 DIAGNOSIS — F0390 Unspecified dementia without behavioral disturbance: Secondary | ICD-10-CM | POA: Insufficient documentation

## 2018-04-28 DIAGNOSIS — I110 Hypertensive heart disease with heart failure: Secondary | ICD-10-CM | POA: Diagnosis not present

## 2018-04-28 DIAGNOSIS — I82409 Acute embolism and thrombosis of unspecified deep veins of unspecified lower extremity: Secondary | ICD-10-CM | POA: Diagnosis present

## 2018-04-28 DIAGNOSIS — G4700 Insomnia, unspecified: Secondary | ICD-10-CM | POA: Insufficient documentation

## 2018-04-28 DIAGNOSIS — Z950 Presence of cardiac pacemaker: Secondary | ICD-10-CM | POA: Diagnosis not present

## 2018-04-28 DIAGNOSIS — M810 Age-related osteoporosis without current pathological fracture: Secondary | ICD-10-CM | POA: Diagnosis not present

## 2018-04-28 DIAGNOSIS — I82411 Acute embolism and thrombosis of right femoral vein: Principal | ICD-10-CM | POA: Insufficient documentation

## 2018-04-28 DIAGNOSIS — Z79891 Long term (current) use of opiate analgesic: Secondary | ICD-10-CM | POA: Diagnosis not present

## 2018-04-28 DIAGNOSIS — R0602 Shortness of breath: Secondary | ICD-10-CM

## 2018-04-28 LAB — BASIC METABOLIC PANEL
Anion gap: 8 (ref 5–15)
BUN: 18 mg/dL (ref 8–23)
CALCIUM: 8.7 mg/dL — AB (ref 8.9–10.3)
CHLORIDE: 99 mmol/L (ref 98–111)
CO2: 33 mmol/L — AB (ref 22–32)
CREATININE: 1.03 mg/dL — AB (ref 0.44–1.00)
GFR, EST AFRICAN AMERICAN: 54 mL/min — AB (ref >60)
GFR, EST NON AFRICAN AMERICAN: 47 mL/min — AB (ref >60)
Glucose, Bld: 110 mg/dL — ABNORMAL HIGH (ref 70–99)
Potassium: 3.6 mmol/L (ref 3.5–5.1)
Sodium: 140 mmol/L (ref 135–145)

## 2018-04-28 LAB — PROTIME-INR
INR: 1.04
Prothrombin Time: 13.5 seconds (ref 11.4–15.2)

## 2018-04-28 MED ORDER — ENOXAPARIN SODIUM 100 MG/ML ~~LOC~~ SOLN
SUBCUTANEOUS | Status: AC
  Administered 2018-04-28: 65 mg via SUBCUTANEOUS
  Filled 2018-04-28: qty 1

## 2018-04-28 MED ORDER — ENOXAPARIN SODIUM 80 MG/0.8ML ~~LOC~~ SOLN
1.0000 mg/kg | Freq: Once | SUBCUTANEOUS | Status: AC
  Administered 2018-04-28: 65 mg via SUBCUTANEOUS
  Filled 2018-04-28: qty 0.8

## 2018-04-28 NOTE — ED Notes (Signed)
ED Provider Floyce Stakes at bedside to redress wound at bedside.  Incision sites clean, and well approximated.

## 2018-04-28 NOTE — ED Provider Notes (Signed)
[facility] Emergency Department Provider Note ____________________________________________   First MD Initiated Contact with Patient 04/28/18 2035     (approximate)  I have reviewed the triage vital signs and the nursing notes.   HISTORY  Chief Complaint Wound Infection  Level 5 caveat: History of present illness limited due to dementia  HPI Bayleigh Loflin is a 84 y.o. female with PMH as noted below and status post right hip surgery 8 days ago who presents with a concern for drainage from the incision site.  Per the family, 1 of the wound vacs has lost its seal and there is been drainage from the wounds but no odor or rash.  The swelling has been decreasing.  She has been moving around well and doing her therapy as planned.  There is some increased swelling to the right lower leg.  Past Medical History:  Diagnosis Date  . Atrial fibrillation (HCC)   . Chronic systolic heart failure (HCC)   . COPD (chronic obstructive pulmonary disease) (HCC)   . Osteoarthritis, multiple sites   . Osteoporosis, post-menopausal   . Presence of permanent cardiac pacemaker     Patient Active Problem List   Diagnosis Date Noted  . 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 Atrial fibrillation (HCC)   . Chronic systolic heart failure (HCC)   . Osteoporosis, post-menopausal   . Osteoarthritis, multiple sites     Past Surgical History:  Procedure Laterality Date  . CARDIOVERSION  ~2009  . HIP FRACTURE SURGERY Bilateral [id number]   each hip fractured at separate times  . INSERT / REPLACE / REMOVE PACEMAKER    . TOTAL HIP ARTHROPLASTY Right 04/20/2018   Procedure: TOTAL HIP CONVERSION;  Surgeon: Kennedy Bucker, MD;  Location: [facility] ORS;  Service: Orthopedics;  Laterality: Right;    Prior to Admission medications   Medication Sig Start Date End Date Taking? Authorizing  Provider  acetaminophen (TYLENOL) 500 MG tablet Take 1,000 mg by mouth every 8 (eight) hours as needed for moderate pain.     [provider]  amLODipine (NORVASC) 5 MG tablet Take 5 mg by mouth daily. 02/09/18   [provider]  budesonide (PULMICORT) 0.5 MG/2ML nebulizer solution Take 0.5 mg by nebulization daily. 01/23/18   [provider]  Calcium Carbonate-Vitamin D (CALCIUM-VITAMIN D) 500-200 MG-UNIT per tablet Take 1 tablet by mouth daily.    [provider]  clotrimazole (LOTRIMIN) 1 % cream Apply 1 application topically daily as needed (irritation).  10/21/17 10/21/18  [provider]  docusate sodium (COLACE) 100 MG capsule Take 1 capsule (100 mg total) by mouth daily as needed. Patient not taking: Reported on 04/10/2018 12/18/17 12/18/18  Jeanmarie Plant, MD  formoterol (PERFOROMIST) 20 MCG/2ML nebulizer solution Inhale 20 mcg into the lungs every 12 (twelve) hours. 11/30/17   [provider]  furosemide (LASIX) 20 MG tablet Take 40 mg by mouth daily.     [provider]  HYDROcodone-acetaminophen (NORCO/VICODIN) 5-325 MG tablet Take 1 tablet by mouth every 8 (eight) hours as needed for moderate pain (pain score 4-6). 04/25/18   Evon Slack, PA-C  potassium chloride (K-DUR) 10 MEQ tablet Take 10 mEq by mouth daily. 02/18/18   [provider]  traMADol (ULTRAM) 50 MG tablet Take 0.5 tablets (25 mg total) by mouth every 6 (six) hours as needed. Patient taking differently: Take 50 mg by mouth every 6 (  six) hours as needed for moderate pain.  12/17/17 12/17/18  Enid Derry, PA-C  traMADol (ULTRAM) 50 MG tablet Take 1 tablet (50 mg total) by mouth every 6 (six) hours as needed. 04/25/18   Evon Slack, PA-C  vitamin B-12 (CYANOCOBALAMIN) 1000 MCG tablet Take 1,000 mcg by mouth daily.    [provider]  XARELTO 15 MG TABS tablet Take 15 mg by mouth daily with supper. 02/24/18   [provider]     Allergies Patient has no known allergies.  History reviewed. No pertinent family history.  Social History Social History   Tobacco Use  . Smoking status: Never Smoker  . Smokeless tobacco: Never Used  Substance Use Topics  . Alcohol use: Not Currently  . Drug use: No    Review of Systems Level 5 caveat: Unable to obtain review of systems due to dementia   ____________________________________________   PHYSICAL EXAM:  VITAL SIGNS: ED Triage Vitals  Enc Vitals Group     BP 04/28/18 1923 (!) 130/46     Pulse Rate 04/28/18 1923 70     Resp 04/28/18 1923 16     Temp 04/28/18 1923 98.1 F (36.7 C)     Temp Source 04/28/18 1923 Oral     SpO2 04/28/18 1923 98 %     Weight 04/28/18 1924 139 lb 1.8 oz (63.1 kg)     Height 04/28/18 1924 5' (1.524 m)     Head Circumference --      Peak Flow --      Pain Score 04/28/18 1924 0     Pain Loc --      Pain Edu? --      Excl. in GC? --     Constitutional: Alert and at baseline mental status.  Comfortable appearing. Eyes: Conjunctivae are normal.  Head: Atraumatic. Nose: No congestion/rhinnorhea. Mouth/Throat: Mucous membranes are moist.   Neck: Normal range of motion.  Cardiovascular: Good peripheral circulation. Respiratory: Normal respiratory effort.  Gastrointestinal: No distention.  Musculoskeletal: Extremities warm and well perfused.  Right hip wounds clean/dry/intact.  Trace amount of serous drainage but no active drainage.  No masses.  No erythema or induration to the skin.  No abnormal warmth.  Good range of motion at the right hip.  Swelling to right distal lower extremity.  2+ DP pulse present. Neurologic: No gross focal neurologic deficits are appreciated.  Skin:  Skin is warm and dry. No rash noted. Psychiatric: Calm and cooperative.  ____________________________________________   LABS (all labs ordered are listed, but only abnormal results are displayed)  Labs Reviewed  BASIC METABOLIC PANEL  CBC WITH  DIFFERENTIAL/PLATELET  PROTIME-INR  APTT   ____________________________________________  EKG   ____________________________________________  RADIOLOGY  [place] RLE: Acute DVT  ____________________________________________   PROCEDURES  Procedure(s) performed: No  Procedures  Critical Care performed: No ____________________________________________   INITIAL IMPRESSION / ASSESSMENT AND PLAN / ED COURSE  Pertinent labs & imaging results that were available during my care of the patient were reviewed by me and considered in my medical decision making (see chart for details).  84 year old female status post right hip surgery 1 week ago presents with concern for increased drainage from the wound and 1 of the wound vacs losing it seal.  She also has some swelling to the distal right lower extremity.  On exam the patient is well-appearing.  There is a small amount of serous drainage from the wounds but no significant swelling there, erythema, or purulence.  There  is swelling to the distal lower leg.  The patient was evaluated in the ED by the orthopedics PA Floyce Stakes, who was following the patient while she was in the hospital.  He states that the swelling to the surgical area appears significantly improved and the wounds appear to be healing normally. He replaced the dressings.  He recommends that the patient continue with her management as planned and advises that no further work-up for infection is indicated.  Based on my evaluation, there is no evidence of wound infection or other acute complication.  However, her distal lower leg swelling is somewhat concerning for DVT.  It is more likely just dependent edema related to the surgery, but based on discussion with the orthopedics PA we will obtain a DVT study.  If it is negative, the patient will be discharged home.  Family agrees with this plan.  ----------------------------------------- 11:26 PM on  04/28/2018 -----------------------------------------  Ultrasound shows acute DVT in the right leg.  I confirmed with the family the patient is taking Xarelto.  Given that she has developed an acute DVT while on anticoagulation I feel that she requires admission for Lovenox and evaluation for possible IVC filter.  The family agrees with this plan.  I ordered labs and signed her out to the hospitalist Dr. Caryn Bee. ____________________________________________   FINAL CLINICAL IMPRESSION(S) / ED DIAGNOSES  Final diagnoses:  Acute deep vein thrombosis (DVT) of proximal vein of right lower extremity (HCC)      NEW MEDICATIONS STARTED DURING THIS VISIT:  New Prescriptions   No medications on file     Note:  This document was prepared using Dragon voice recognition software and may include unintentional dictation errors.    Dionne Bucy, MD 04/28/18 2326

## 2018-04-28 NOTE — ED Triage Notes (Signed)
Pt to ED reporting drainage from incision site. Pts family reporting the drainage is grey in color. Pt had surgery 8 days ago and was discharged 2 days ago. Pt is afebrile in triage. Pt is alert and oriented x 4. Pt has a wound vac on right thigh with two separate sponges. One sponge is inflated again and appears to have lost its seal. No odor from wound site. No redness or tenderness noted.

## 2018-04-28 NOTE — ED Notes (Signed)
EDP and Gaines at bedside. Taking wound vac off and replacing with bandages.

## 2018-04-28 NOTE — ED Notes (Signed)
Compression stocking placed on right leg.

## 2018-04-29 ENCOUNTER — Observation Stay: Payer: Medicare Other

## 2018-04-29 ENCOUNTER — Encounter: Payer: Self-pay | Admitting: Emergency Medicine

## 2018-04-29 DIAGNOSIS — I82411 Acute embolism and thrombosis of right femoral vein: Secondary | ICD-10-CM | POA: Diagnosis not present

## 2018-04-29 DIAGNOSIS — I82409 Acute embolism and thrombosis of unspecified deep veins of unspecified lower extremity: Secondary | ICD-10-CM | POA: Diagnosis present

## 2018-04-29 LAB — CBC WITH DIFFERENTIAL/PLATELET
BASOS PCT: 0 %
Basophils Absolute: 0 10*3/uL (ref 0.0–0.1)
Eosinophils Absolute: 0.3 10*3/uL (ref 0.0–0.5)
Eosinophils Relative: 6 %
HCT: 31.2 % — ABNORMAL LOW (ref 36.0–46.0)
Hemoglobin: 9.5 g/dL — ABNORMAL LOW (ref 12.0–15.0)
LYMPHS PCT: 20 %
Lymphs Abs: 1.1 10*3/uL (ref 0.7–4.0)
MCH: 24.9 pg — AB (ref 26.0–34.0)
MCHC: 30.4 g/dL (ref 30.0–36.0)
MCV: 81.7 fL (ref 80.0–100.0)
MONO ABS: 0.5 10*3/uL (ref 0.1–1.0)
MONOS PCT: 8 %
Neutro Abs: 3.8 10*3/uL (ref 1.7–7.7)
Neutrophils Relative %: 66 %
Platelets: 295 10*3/uL (ref 150–400)
RBC: 3.82 MIL/uL — ABNORMAL LOW (ref 3.87–5.11)
RDW: 22.8 % — AB (ref 11.5–15.5)
WBC: 5.7 10*3/uL (ref 4.0–10.5)
nRBC: 0 % (ref 0.0–0.2)

## 2018-04-29 LAB — CBC
HCT: 29.5 % — ABNORMAL LOW (ref 36.0–46.0)
Hemoglobin: 8.9 g/dL — ABNORMAL LOW (ref 12.0–15.0)
MCH: 25 pg — ABNORMAL LOW (ref 26.0–34.0)
MCHC: 30.2 g/dL (ref 30.0–36.0)
MCV: 82.9 fL (ref 80.0–100.0)
NRBC: 0 % (ref 0.0–0.2)
PLATELETS: 263 10*3/uL (ref 150–400)
RBC: 3.56 MIL/uL — ABNORMAL LOW (ref 3.87–5.11)
RDW: 22.8 % — AB (ref 11.5–15.5)
WBC: 4.9 10*3/uL (ref 4.0–10.5)

## 2018-04-29 LAB — BASIC METABOLIC PANEL
Anion gap: 10 (ref 5–15)
BUN: 16 mg/dL (ref 8–23)
CALCIUM: 8.3 mg/dL — AB (ref 8.9–10.3)
CO2: 31 mmol/L (ref 22–32)
CREATININE: 0.94 mg/dL (ref 0.44–1.00)
Chloride: 100 mmol/L (ref 98–111)
GFR, EST NON AFRICAN AMERICAN: 52 mL/min — AB (ref >60)
Glucose, Bld: 108 mg/dL — ABNORMAL HIGH (ref 70–99)
Potassium: 3.5 mmol/L (ref 3.5–5.1)
SODIUM: 141 mmol/L (ref 135–145)

## 2018-04-29 LAB — GLUCOSE, CAPILLARY: Glucose-Capillary: 84 mg/dL (ref 70–99)

## 2018-04-29 LAB — APTT: aPTT: 26 seconds (ref 24–36)

## 2018-04-29 LAB — C-REACTIVE PROTEIN: CRP: 3.6 mg/dL — AB (ref <1.0)

## 2018-04-29 LAB — SEDIMENTATION RATE: Sed Rate: 30 mm/hr (ref 0–30)

## 2018-04-29 MED ORDER — ONDANSETRON HCL 4 MG/2ML IJ SOLN: 4.0000 mg | Freq: Four times a day (QID) | INTRAMUSCULAR | Status: DC | PRN

## 2018-04-29 MED ORDER — ACETAMINOPHEN 500 MG PO TABS
1000.0000 mg | ORAL_TABLET | Freq: Three times a day (TID) | ORAL | Status: DC
  Administered 2018-04-29 – 2018-05-01 (×7): 1000 mg via ORAL
  Filled 2018-04-29 (×7): qty 2

## 2018-04-29 MED ORDER — TRAMADOL HCL 50 MG PO TABS
50.0000 mg | ORAL_TABLET | Freq: Two times a day (BID) | ORAL | Status: DC | PRN
  Administered 2018-04-29: 50 mg via ORAL
  Filled 2018-04-29: qty 1

## 2018-04-29 MED ORDER — HYDROCODONE-ACETAMINOPHEN 5-325 MG PO TABS: 1.0000 | ORAL_TABLET | Freq: Three times a day (TID) | ORAL | Status: DC | PRN

## 2018-04-29 MED ORDER — HYDROCODONE-ACETAMINOPHEN 5-325 MG PO TABS
1.0000 | ORAL_TABLET | Freq: Three times a day (TID) | ORAL | Status: DC | PRN
  Administered 2018-04-30 – 2018-05-01 (×3): 1 via ORAL
  Filled 2018-04-29 (×5): qty 1

## 2018-04-29 MED ORDER — ONDANSETRON HCL 4 MG PO TABS: 4.0000 mg | ORAL_TABLET | Freq: Four times a day (QID) | ORAL | Status: DC | PRN

## 2018-04-29 MED ORDER — POTASSIUM CHLORIDE CRYS ER 10 MEQ PO TBCR
10.0000 meq | EXTENDED_RELEASE_TABLET | Freq: Every day | ORAL | Status: DC
  Administered 2018-04-29 – 2018-05-01 (×3): 10 meq via ORAL
  Filled 2018-04-29 (×3): qty 1

## 2018-04-29 MED ORDER — FERROUS SULFATE 325 (65 FE) MG PO TABS
325.0000 mg | ORAL_TABLET | Freq: Every day | ORAL | Status: DC
  Administered 2018-04-29 – 2018-05-01 (×3): 325 mg via ORAL
  Filled 2018-04-29 (×3): qty 1

## 2018-04-29 MED ORDER — AMLODIPINE BESYLATE 5 MG PO TABS
5.0000 mg | ORAL_TABLET | Freq: Every day | ORAL | Status: DC
  Administered 2018-04-29 – 2018-05-01 (×3): 5 mg via ORAL
  Filled 2018-04-29 (×3): qty 1

## 2018-04-29 MED ORDER — BUDESONIDE 0.5 MG/2ML IN SUSP
0.5000 mg | Freq: Every day | RESPIRATORY_TRACT | Status: DC
  Administered 2018-04-29 – 2018-05-01 (×3): 0.5 mg via RESPIRATORY_TRACT
  Filled 2018-04-29 (×3): qty 2

## 2018-04-29 MED ORDER — ACETAMINOPHEN 650 MG RE SUPP: 650.0000 mg | Freq: Four times a day (QID) | RECTAL | Status: DC | PRN

## 2018-04-29 MED ORDER — ARFORMOTEROL TARTRATE 15 MCG/2ML IN NEBU
15.0000 ug | INHALATION_SOLUTION | Freq: Two times a day (BID) | RESPIRATORY_TRACT | Status: DC
  Administered 2018-04-29 – 2018-05-01 (×5): 15 ug via RESPIRATORY_TRACT
  Filled 2018-04-29 (×6): qty 2

## 2018-04-29 MED ORDER — DOCUSATE SODIUM 100 MG PO CAPS
100.0000 mg | ORAL_CAPSULE | Freq: Two times a day (BID) | ORAL | Status: DC
  Administered 2018-04-29 – 2018-05-01 (×3): 100 mg via ORAL
  Filled 2018-04-29 (×4): qty 1

## 2018-04-29 MED ORDER — FUROSEMIDE 20 MG PO TABS
20.0000 mg | ORAL_TABLET | Freq: Every day | ORAL | Status: DC
  Administered 2018-04-29: 20 mg via ORAL
  Filled 2018-04-29: qty 1

## 2018-04-29 MED ORDER — TRAZODONE HCL 50 MG PO TABS: 25.0000 mg | ORAL_TABLET | Freq: Every evening | ORAL | Status: DC | PRN

## 2018-04-29 MED ORDER — ENOXAPARIN SODIUM 60 MG/0.6ML ~~LOC~~ SOLN
1.0000 mg/kg | SUBCUTANEOUS | Status: DC
  Administered 2018-04-29: 60 mg via SUBCUTANEOUS
  Filled 2018-04-29: qty 0.6

## 2018-04-29 MED ORDER — FUROSEMIDE 20 MG PO TABS
20.0000 mg | ORAL_TABLET | Freq: Every evening | ORAL | Status: DC | PRN
  Administered 2018-04-29: 20 mg via ORAL
  Filled 2018-04-29 (×2): qty 1

## 2018-04-29 MED ORDER — HYDROMORPHONE HCL 2 MG PO TABS: 2.0000 mg | ORAL_TABLET | ORAL | Status: DC | PRN

## 2018-04-29 MED ORDER — HYDROMORPHONE HCL 1 MG/ML IJ SOLN: 0.5000 mg | INTRAMUSCULAR | Status: DC | PRN

## 2018-04-29 MED ORDER — BISACODYL 5 MG PO TBEC: 5.0000 mg | DELAYED_RELEASE_TABLET | Freq: Every day | ORAL | Status: DC | PRN

## 2018-04-29 MED ORDER — ACETAMINOPHEN 325 MG PO TABS: 650.0000 mg | ORAL_TABLET | Freq: Four times a day (QID) | ORAL | Status: DC | PRN

## 2018-04-29 MED ORDER — HYDROCODONE-ACETAMINOPHEN 5-325 MG PO TABS
1.0000 | ORAL_TABLET | ORAL | Status: DC | PRN
  Administered 2018-04-29: 1 via ORAL
  Filled 2018-04-29: qty 1

## 2018-04-29 MED ORDER — CALCIUM CARBONATE-VITAMIN D 500-200 MG-UNIT PO TABS
1.0000 | ORAL_TABLET | Freq: Every day | ORAL | Status: DC
  Administered 2018-04-29 – 2018-05-01 (×3): 1 via ORAL
  Filled 2018-04-29 (×3): qty 1

## 2018-04-29 MED ORDER — VITAMIN B-12 1000 MCG PO TABS
1000.0000 ug | ORAL_TABLET | Freq: Every day | ORAL | Status: DC
  Administered 2018-04-29 – 2018-05-01 (×3): 1000 ug via ORAL
  Filled 2018-04-29 (×3): qty 1

## 2018-04-29 MED ORDER — FUROSEMIDE 40 MG PO TABS
40.0000 mg | ORAL_TABLET | Freq: Every day | ORAL | Status: DC
  Administered 2018-04-30 – 2018-05-01 (×2): 40 mg via ORAL
  Filled 2018-04-29 (×2): qty 1

## 2018-04-29 NOTE — Care Management Obs Status (Signed)
MEDICARE OBSERVATION STATUS NOTIFICATION   Patient Details  Name: Diana Powell MRN: [id number] Date of Birth: 17-Jan-1927   Medicare Observation Status Notification Given:  Yes    Jaanvi Fizer A Keilyn Nadal, RN 04/29/2018, 9:18 AM

## 2018-04-29 NOTE — H&P (Signed)
[facility] [place] at [facility]   PATIENT NAME: Diana Powell    MR#:  [id number]  DATE OF BIRTH:  1927/07/05  DATE OF ADMISSION:  04/28/2018  PRIMARY CARE PHYSICIAN: Lollie Marrow, MD   REQUESTING/REFERRING PHYSICIAN:   CHIEF COMPLAINT:   Chief Complaint  Patient presents with  . Wound Infection    HISTORY OF PRESENT ILLNESS: Mahitha Hickling  is a 84 y.o. female with a known history of atrial fibrillation on Xarelto, COPD, CHF.  Patient is status post recent total right hip replacement surgery, 8 days ago. Patient presented to emergency room for clear, yellow drainage from surgical wound noted in the past 24 hours.  No reports of fever, chills, bleeding.  The drainage from the surgical wound has no odor.  Right lower extremity is swollen, status post surgery.  Patient has been ambulating with physical therapy and she has been compliant with her medications including Xarelto. Blood test done emergency room, including CBC and CMP are grossly unremarkable except for lower hemoglobin level of 9.5. Lower extremities venous Doppler, done in emergency room is positive for acute DVT in the distal superficial femoral vein and in the calf veins. Patient was started on Lovenox and admitted for observation.   PAST MEDICAL HISTORY:   Past Medical History:  Diagnosis Date  . Atrial fibrillation (HCC)   . Chronic systolic heart failure (HCC)   . COPD (chronic obstructive pulmonary disease) (HCC)   . Osteoarthritis, multiple sites   . Osteoporosis, post-menopausal   . Presence of permanent cardiac pacemaker     PAST SURGICAL HISTORY:  Past Surgical History:  Procedure Laterality Date  . CARDIOVERSION  ~2009  . HIP FRACTURE SURGERY Bilateral [phone]   each hip fractured at separate times  . INSERT / REPLACE / REMOVE PACEMAKER    . TOTAL HIP ARTHROPLASTY Right 04/20/2018   Procedure: TOTAL HIP CONVERSION;  Surgeon: Kennedy Bucker, MD;  Location: [facility] ORS;   Service: Orthopedics;  Laterality: Right;    SOCIAL HISTORY:  Social History   Tobacco Use  . Smoking status: Never Smoker  . Smokeless tobacco: Never Used  Substance Use Topics  . Alcohol use: Not Currently    FAMILY HISTORY: History reviewed. No pertinent family history.  DRUG ALLERGIES: No Known Allergies  REVIEW OF SYSTEMS:   CONSTITUTIONAL: No fever, fatigue or weakness.  EYES: No changes in vision.  EARS, NOSE, AND THROAT: No tinnitus or ear pain.  RESPIRATORY: No cough, shortness of breath, wheezing or hemoptysis.  CARDIOVASCULAR: No chest pain, orthopnea, edema.  GASTROINTESTINAL: No nausea, vomiting, diarrhea or abdominal pain.  GENITOURINARY: No dysuria, hematuria.  ENDOCRINE: No polyuria, nocturia. HEMATOLOGY: No bleeding. SKIN: No rash or lesion. MUSCULOSKELETAL: No joint pain at this time.   NEUROLOGIC: No focal weakness.  PSYCHIATRY: No anxiety or depression.   MEDICATIONS AT HOME:  Prior to Admission medications   Medication Sig Start Date End Date Taking? Authorizing Provider  acetaminophen (TYLENOL) 500 MG tablet Take 1,000 mg by mouth every 8 (eight) hours as needed for moderate pain.    Yes [provider]  amLODipine (NORVASC) 5 MG tablet Take 5 mg by mouth daily. 02/09/18  Yes [provider]  budesonide (PULMICORT) 0.5 MG/2ML nebulizer solution Take 0.5 mg by nebulization daily. 01/23/18  Yes [provider]  Calcium Carbonate-Vitamin D (CALCIUM-VITAMIN D) 500-200 MG-UNIT per tablet Take 1 tablet by mouth daily.   Yes [provider]  ferrous sulfate 325 (65 FE) MG tablet  Take 325 mg by mouth daily with breakfast.   Yes [provider]  formoterol (PERFOROMIST) 20 MCG/2ML nebulizer solution Inhale 20 mcg into the lungs every 12 (twelve) hours. 11/30/17  Yes [provider]  furosemide (LASIX) 20 MG tablet Take 20 mg by mouth daily. nORMALLY 40MG  IN THE MORNING AND 20 MG IN THE EVENING AS NEEDED ONLY    Yes [provider]  HYDROcodone-acetaminophen (NORCO/VICODIN) 5-325 MG tablet Take 1 tablet by mouth every 8 (eight) hours as needed for moderate pain (pain score 4-6). 04/25/18  Yes Evon Slack, PA-C  potassium chloride (K-DUR) 10 MEQ tablet Take 10 mEq by mouth daily. 02/18/18  Yes [provider]  traMADol (ULTRAM) 50 MG tablet Take 0.5 tablets (25 mg total) by mouth every 6 (six) hours as needed. Patient taking differently: Take 50 mg by mouth every 6 (six) hours as needed for moderate pain.  12/17/17 12/17/18 Yes Enid Derry, PA-C  vitamin B-12 (CYANOCOBALAMIN) 1000 MCG tablet Take 1,000 mcg by mouth daily.   Yes [provider]  XARELTO 15 MG TABS tablet Take 15 mg by mouth daily with supper. 02/24/18  Yes [provider]  clotrimazole (LOTRIMIN) 1 % cream Apply 1 application topically daily as needed (irritation).  10/21/17 10/21/18  [provider]  docusate sodium (COLACE) 100 MG capsule Take 1 capsule (100 mg total) by mouth daily as needed. Patient not taking: Reported on 04/10/2018 12/18/17 12/18/18  Jeanmarie Plant, MD  traMADol (ULTRAM) 50 MG tablet Take 1 tablet (50 mg total) by mouth every 6 (six) hours as needed. Patient not taking: Reported on 04/28/2018 04/25/18   Evon Slack, PA-C      PHYSICAL EXAMINATION:   VITAL SIGNS: Blood pressure (!) 147/61, pulse 70, temperature 97.7 F (36.5 C), temperature source Oral, resp. rate 16, height 5' (1.524 m), weight 63.1 kg, SpO2 96 %.  GENERAL:  84 y.o.-year-old patient lying in the bed with no acute distress.  EYES: Pupils equal, round, reactive to light and accommodation. No scleral icterus. Extraocular muscles intact.  HEENT: Head atraumatic, normocephalic. Oropharynx and nasopharynx clear.  NECK:  Supple, no jugular venous distention. No thyroid enlargement, no tenderness.  LUNGS: Normal breath sounds bilaterally, no wheezing, rales,rhonchi or crepitation. No use of accessory muscles of  respiration.  CARDIOVASCULAR: S1, S2 normal. No S3/S4.  ABDOMEN: Soft, nontender, nondistended. Bowel sounds present. No organomegaly or mass.  EXTREMITIES: Right lower extremity 2+ edema noted, no erythema or tenderness.  NEUROLOGIC: No focal weakness. PSYCHIATRIC: The patient is alert and oriented x 3.  SKIN: Surgical wound at the right hip looks clean, healing well.  There is trace amount of serous drainage from the wound, no odor, no bleeding.  LABORATORY PANEL:   CBC Recent Labs  Lab 04/23/18 1433 04/24/18 0219 04/24/18 1035 04/25/18 0542 04/26/18 0830 04/28/18 2302  WBC 9.3 7.9  --  5.1 5.2 5.7  HGB 7.9* 7.5* 9.0* 8.3* 9.7* 9.5*  HCT 25.0* 23.8* 27.7* 26.5* 32.1* 31.2*  PLT 222 226  --  226 262 295  MCV 75.2* 76.2*  --  78.1* 81.1 81.7  MCH 23.8* 24.1*  --  24.5* 24.5* 24.9*  MCHC 31.7* 31.6*  --  31.4* 30.2 30.4  RDW 19.8* 19.8*  --  20.5* 20.8* 22.8*  LYMPHSABS  --   --   --   --   --  1.1  MONOABS  --   --   --   --   --  0.5  EOSABS  --   --   --   --   --  0.3  BASOSABS  --   --   --   --   --  0.0   ------------------------------------------------------------------------------------------------------------------  Chemistries  Recent Labs  Lab 04/22/18 0629 04/23/18 1433 04/24/18 0219 04/25/18 0542 04/28/18 2302  NA 138 138 138 141 140  K 4.5 5.0 3.9 3.4* 3.6  CL 100 101 100 101 99  CO2 29 30 31  34* 33*  GLUCOSE 139* 158* 120* 104* 110*  BUN 22 27* 25* 24* 18  CREATININE 1.39* 1.18* 0.98 0.93 1.03*  CALCIUM 8.5* 8.5* 8.2* 8.0* 8.7*   ------------------------------------------------------------------------------------------------------------------ estimated creatinine clearance is 30.7 mL/min (A) (by C-G formula based on SCr of 1.03 mg/dL (H)). ------------------------------------------------------------------------------------------------------------------ No results for input(s): TSH, T4TOTAL, T3FREE, THYROIDAB in the last 72 hours.  Invalid  input(s): FREET3   Coagulation profile Recent Labs  Lab 04/28/18 2302  INR 1.04   ------------------------------------------------------------------------------------------------------------------- No results for input(s): DDIMER in the last 72 hours. -------------------------------------------------------------------------------------------------------------------  Cardiac Enzymes Recent Labs  Lab 04/24/18 0219 04/24/18 1311 04/24/18 1954  TROPONINI <0.03 <0.03 <0.03   ------------------------------------------------------------------------------------------------------------------ Invalid input(s): POCBNP  ---------------------------------------------------------------------------------------------------------------  Urinalysis    Component Value Date/Time   COLORURINE YELLOW (A) 04/25/2018 1103   APPEARANCEUR CLEAR (A) 04/25/2018 1103   LABSPEC 1.015 04/25/2018 1103   PHURINE 5.0 04/25/2018 1103   GLUCOSEU NEGATIVE 04/25/2018 1103   HGBUR NEGATIVE 04/25/2018 1103   BILIRUBINUR NEGATIVE 04/25/2018 1103   KETONESUR NEGATIVE 04/25/2018 1103   PROTEINUR NEGATIVE 04/25/2018 1103   NITRITE NEGATIVE 04/25/2018 1103   LEUKOCYTESUR NEGATIVE 04/25/2018 1103     RADIOLOGY: US Venous Img Lower Unilateral Right  Result Date: 04/28/2018 CLINICAL DATA:  Right leg swelling after hip surgery. EXAM: Right LOWER EXTREMITY VENOUS DOPPLER ULTRASOUND TECHNIQUE: Gray-scale sonography with graded compression, as well as color Doppler and duplex ultrasound were performed to evaluate the lower extremity deep venous systems from the level of the common femoral vein and including the common femoral, femoral, profunda femoral, popliteal and calf veins including the posterior tibial, peroneal and gastrocnemius veins when visible. The superficial great saphenous vein was also interrogated. Spectral Doppler was utilized to evaluate flow at rest and with distal augmentation maneuvers in the common  femoral, femoral and popliteal veins. COMPARISON:  None. FINDINGS: Contralateral Common Femoral Vein: Respiratory phasicity is normal and symmetric with the symptomatic side. No evidence of thrombus. Normal compressibility. Common Femoral Vein: No evidence of thrombus. Normal compressibility, respiratory phasicity and response to augmentation. Saphenofemoral Junction: No evidence of thrombus. Normal compressibility and flow on color Doppler imaging. Profunda Femoral Vein: No evidence of thrombus. Normal compressibility and flow on color Doppler imaging. Femoral Vein: Nonocclusive partial filling defect with incomplete compressibility of the distal right superficial femoral vein. Popliteal Vein: No evidence of thrombus. Normal compressibility, respiratory phasicity and response to augmentation. Calf Veins: Nonocclusive partial filling defect with incomplete compressibility of the calf veins. Superficial Great Saphenous Vein: No evidence of thrombus. Normal compressibility. Venous Reflux:  None. Other Findings:  None. IMPRESSION: Positive for deep venous thrombosis. Nonocclusive thrombus demonstrated in the distal superficial femoral vein and in the calf veins. These results were called by telephone at the time of interpretation on 04/28/2018 at 10:40 pm to Dr. Dionne Bucy , who verbally acknowledged these results. Electronically Signed   By: Burman Nieves M.D.   On: 04/28/2018 22:42    EKG: Orders placed or performed during the hospital encounter of 04/20/18  .  EKG 12-Lead  . EKG 12-Lead  . EKG 12-Lead  . EKG 12-Lead    IMPRESSION AND PLAN:  1.  Acute RT LE DVT, while on Xarelto.  At this point, will discontinue Xarelto and start treatment with Lovenox. 2.  Status post recent right hip replacement, continue management per orthopedics. 3.  Paroxysmal atrial fibrillation, currently rate controlled.  Continue anticoagulation with Lovenox. 4.  Hypertension, stable, continue home medications. 5.   COPD, without exacerbation.  Continue to monitor clinically closely.  All the records are reviewed and case discussed with ED provider. Management plans discussed with the patient, family and they are in agreement.  CODE STATUS: Full Code Status History    Date Active Date Inactive Code Status Order ID Comments User Context   04/20/2018 1512 04/26/2018 1539 Full Code [id number]  Kennedy Bucker, MD Inpatient    Advance Directive Documentation     Most Recent Value  Type of Advance Directive  Healthcare Power of Attorney  Pre-existing out of facility DNR order (yellow form or pink MOST form)  -  "MOST" Form in Place?  -       TOTAL TIME TAKING CARE OF THIS PATIENT: 45 minutes.    Cammy Copa M.D on 04/29/2018 at 12:42 AM  Between 7am to 6pm - Pager - [phone]  After 6pm go to [email or web address] - password [email or web address] [facility] [place] [facility]  [phone]  CC: Primary care physician; Lollie Marrow, MD

## 2018-04-29 NOTE — Progress Notes (Signed)
Son stating that mother takes 40mg  of lasix in the morning and 20mg  of lasix in the evening as needed. Pt with swollen legs and son would like her to have another dose of lasix. Spoke with Dr. Lowella Fairy may order her medicine the way she takes at home.

## 2018-04-29 NOTE — ED Notes (Signed)
Admitting MD at bedside.

## 2018-04-29 NOTE — Progress Notes (Addendum)
Admitted this morning for right leg DVT.  Patient had right hip surgery a week ago by Dr. Rosita Kea.  Family noticed some oozing from the right hip surgery site, clean in the ER and put the bandage.  Patient was taking Xarelto for A. fib before discontinued temporarily for surgery.  Started back on Xarelto but right leg swollen and painful, right leg ultrasound showed DVT.  Now started on Lovenox.  Labs are essentially normal. Vitals stable Right hip: Bandage present, swollen. Assessment and plan: Right leg DVT, unclear if it is called failure of Xarelto because there is an interruption of Xarelto for surgery.  Started on Lovenox, continue Lovenox today, start Eliquis from tomorrow, likely discharge on Monday.  Discussed with patient's son. Discussed with patient's son ,time spent 20 minutes

## 2018-04-29 NOTE — Consult Note (Signed)
ORTHOPAEDIC CONSULTATION  REQUESTING PHYSICIAN: Katha Hamming, MD  Chief Complaint: Malfunctioning incisional wound vac s/p right total hip arthroplasty  HPI: Diana Powell is a 84 y.o. female who underwent right total hip arthroplasty by Dr. Rosita Kea on 10/3 who was readmitted with RLE swelling and a malufunctioning incisional wound vac. She denies any fevers,chills, or hip pain. She denies any CP or SOB.  Past Medical History:  Diagnosis Date  . Atrial fibrillation (HCC)   . Chronic systolic heart failure (HCC)   . COPD (chronic obstructive pulmonary disease) (HCC)   . Osteoarthritis, multiple sites   . Osteoporosis, post-menopausal   . Presence of permanent cardiac pacemaker    Past Surgical History:  Procedure Laterality Date  . CARDIOVERSION  ~2009  . HIP FRACTURE SURGERY Bilateral [phone]   each hip fractured at separate times  . INSERT / REPLACE / REMOVE PACEMAKER    . TOTAL HIP ARTHROPLASTY Right 04/20/2018   Procedure: TOTAL HIP CONVERSION;  Surgeon: Kennedy Bucker, MD;  Location: [facility] ORS;  Service: Orthopedics;  Laterality: Right;   Social History   Socioeconomic History  . Marital status: Widowed    Spouse name: Not on file  . Number of children: 3  . Years of education: Not on file  . Highest education level: Not on file  Occupational History  . Occupation: Engineer--Acoustic    Comment: Retired  Engineer, production  . Financial resource strain: Not on file  . Food insecurity:    Worry: Not on file    Inability: Not on file  . Transportation needs:    Medical: Not on file    Non-medical: Not on file  Tobacco Use  . Smoking status: Never Smoker  . Smokeless tobacco: Never Used  Substance and Sexual Activity  . Alcohol use: Not Currently  . Drug use: No  . Sexual activity: Not on file  Lifestyle  . Physical activity:    Days per week: Not on file    Minutes per session: Not on file  . Stress: Not on file  Relationships  . Social connections:    Talks  on phone: Not on file    Gets together: Not on file    Attends religious service: Not on file    Active member of club or organization: Not on file    Attends meetings of clubs or organizations: Not on file    Relationship status: Not on file  Other Topics Concern  . Not on file  Social History Narrative   Widowed twice   Son and 2 daughters   History reviewed. No pertinent family history. No Known Allergies Prior to Admission medications   Medication Sig Start Date End Date Taking? Authorizing Provider  acetaminophen (TYLENOL) 500 MG tablet Take 1,000 mg by mouth every 8 (eight) hours as needed for moderate pain.    Yes [provider]  amLODipine (NORVASC) 5 MG tablet Take 5 mg by mouth daily. 02/09/18  Yes [provider]  budesonide (PULMICORT) 0.5 MG/2ML nebulizer solution Take 0.5 mg by nebulization daily. 01/23/18  Yes [provider]  Calcium Carbonate-Vitamin D (CALCIUM-VITAMIN D) 500-200 MG-UNIT per tablet Take 1 tablet by mouth daily.   Yes [provider]  ferrous sulfate 325 (65 FE) MG tablet Take 325 mg by mouth daily with breakfast.   Yes [provider]  formoterol (PERFOROMIST) 20 MCG/2ML nebulizer solution Inhale 20 mcg into the lungs every 12 (twelve) hours. 11/30/17  Yes [provider]  furosemide (LASIX) 20  MG tablet Take 20 mg by mouth daily. nORMALLY 40MG  IN THE MORNING AND 20 MG IN THE EVENING AS NEEDED ONLY   Yes [provider]  HYDROcodone-acetaminophen (NORCO/VICODIN) 5-325 MG tablet Take 1 tablet by mouth every 8 (eight) hours as needed for moderate pain (pain score 4-6). 04/25/18  Yes Evon Slack, PA-C  potassium chloride (K-DUR) 10 MEQ tablet Take 10 mEq by mouth daily. 02/18/18  Yes [provider]  traMADol (ULTRAM) 50 MG tablet Take 0.5 tablets (25 mg total) by mouth every 6 (six) hours as needed. Patient taking differently: Take 50 mg by mouth every 6 (six) hours as needed for moderate  pain.  12/17/17 12/17/18 Yes Enid Derry, PA-C  vitamin B-12 (CYANOCOBALAMIN) 1000 MCG tablet Take 1,000 mcg by mouth daily.   Yes [provider]  XARELTO 15 MG TABS tablet Take 15 mg by mouth daily with supper. 02/24/18  Yes [provider]  clotrimazole (LOTRIMIN) 1 % cream Apply 1 application topically daily as needed (irritation).  10/21/17 10/21/18  [provider]  docusate sodium (COLACE) 100 MG capsule Take 1 capsule (100 mg total) by mouth daily as needed. Patient not taking: Reported on 04/10/2018 12/18/17 12/18/18  Jeanmarie Plant, MD  traMADol (ULTRAM) 50 MG tablet Take 1 tablet (50 mg total) by mouth every 6 (six) hours as needed. Patient not taking: Reported on 04/28/2018 04/25/18   Evon Slack, PA-C   Dg Pelvis Portable  Result Date: 04/29/2018 CLINICAL DATA:  Status post right hip replacement with surgical site drainage EXAM: PORTABLE PELVIS 1-2 VIEWS COMPARISON:  None. FINDINGS: Right hip replacement is noted in satisfactory position. No acute bony or soft tissue abnormality is seen degenerative changes of the lumbar spine are noted. IMPRESSION: No acute abnormality noted. Electronically Signed   By: Alcide Clever M.D.   On: 04/29/2018 08:43   US Venous Img Lower Unilateral Right  Result Date: 04/28/2018 CLINICAL DATA:  Right leg swelling after hip surgery. EXAM: Right LOWER EXTREMITY VENOUS DOPPLER ULTRASOUND TECHNIQUE: Gray-scale sonography with graded compression, as well as color Doppler and duplex ultrasound were performed to evaluate the lower extremity deep venous systems from the level of the common femoral vein and including the common femoral, femoral, profunda femoral, popliteal and calf veins including the posterior tibial, peroneal and gastrocnemius veins when visible. The superficial great saphenous vein was also interrogated. Spectral Doppler was utilized to evaluate flow at rest and with distal augmentation maneuvers in the common femoral,  femoral and popliteal veins. COMPARISON:  None. FINDINGS: Contralateral Common Femoral Vein: Respiratory phasicity is normal and symmetric with the symptomatic side. No evidence of thrombus. Normal compressibility. Common Femoral Vein: No evidence of thrombus. Normal compressibility, respiratory phasicity and response to augmentation. Saphenofemoral Junction: No evidence of thrombus. Normal compressibility and flow on color Doppler imaging. Profunda Femoral Vein: No evidence of thrombus. Normal compressibility and flow on color Doppler imaging. Femoral Vein: Nonocclusive partial filling defect with incomplete compressibility of the distal right superficial femoral vein. Popliteal Vein: No evidence of thrombus. Normal compressibility, respiratory phasicity and response to augmentation. Calf Veins: Nonocclusive partial filling defect with incomplete compressibility of the calf veins. Superficial Great Saphenous Vein: No evidence of thrombus. Normal compressibility. Venous Reflux:  None. Other Findings:  None. IMPRESSION: Positive for deep venous thrombosis. Nonocclusive thrombus demonstrated in the distal superficial femoral vein and in the calf veins. These results were called by telephone at the time of interpretation on 04/28/2018 at 10:40 pm to Dr.  SEBASTIAN SIADECKI , who verbally acknowledged these results. Electronically Signed   By: Burman Nieves M.D.   On: 04/28/2018 22:42    Positive ROS: All other systems have been reviewed and were otherwise negative with the exception of those mentioned in the HPI and as above.  Physical Exam: General: Alert, no acute distress Cardiovascular: No pedal edema Respiratory: No cyanosis, no use of accessory musculature GI: No organomegaly, abdomen is soft and non-tender Skin: No lesions in the area of chief complaint Neurologic: Sensation intact distally Psychiatric: Patient is competent for consent with normal mood and affect Lymphatic: No axillary or cervical  lymphadenopathy  MUSCULOSKELETAL: Hip incisions C/D/I without erythema or drainage.  Assessment: Stable post-op total hip replacement complicated by DVT.  Plan: Medical management of DVT. Return to rehab for total hip protocol once medically stable.       04/29/2018 9:47 AM

## 2018-04-29 NOTE — Progress Notes (Signed)
Pt sleeping soundly at this time.

## 2018-04-30 ENCOUNTER — Observation Stay: Payer: Medicare Other

## 2018-04-30 DIAGNOSIS — I82411 Acute embolism and thrombosis of right femoral vein: Secondary | ICD-10-CM | POA: Diagnosis not present

## 2018-04-30 LAB — GLUCOSE, CAPILLARY: Glucose-Capillary: 80 mg/dL (ref 70–99)

## 2018-04-30 MED ORDER — METHOCARBAMOL 500 MG PO TABS
500.0000 mg | ORAL_TABLET | Freq: Once | ORAL | Status: AC
  Administered 2018-04-30: 500 mg via ORAL
  Filled 2018-04-30: qty 1

## 2018-04-30 MED ORDER — TRAZODONE HCL 50 MG PO TABS
50.0000 mg | ORAL_TABLET | Freq: Every evening | ORAL | Status: DC | PRN
  Administered 2018-04-30: 50 mg via ORAL
  Filled 2018-04-30: qty 1

## 2018-04-30 MED ORDER — ENOXAPARIN SODIUM 60 MG/0.6ML ~~LOC~~ SOLN
1.0000 mg/kg | Freq: Two times a day (BID) | SUBCUTANEOUS | Status: DC
  Administered 2018-04-30 – 2018-05-01 (×2): 60 mg via SUBCUTANEOUS
  Filled 2018-04-30 (×3): qty 0.6

## 2018-04-30 NOTE — Progress Notes (Signed)
[facility] [place] [facility]   PATIENT NAME: Diana Powell    MR#:  [id number]  DATE OF BIRTH:  July 15, 1927  SUBJECTIVE:  CHIEF COMPLAINT:   Chief Complaint  Patient presents with  . Wound Infection    REVIEW OF SYSTEMS:   ROS CONSTITUTIONAL: No fever, fatigue or weakness.  EYES: No blurred or double vision.  EARS, NOSE, AND THROAT: No tinnitus or ear pain.  RESPIRATORY: No cough, shortness of breath, wheezing or hemoptysis.  CARDIOVASCULAR: No chest pain, orthopnea, edema.  GASTROINTESTINAL: No nausea, vomiting, diarrhea or abdominal pain.  GENITOURINARY: No dysuria, hematuria.  ENDOCRINE: No polyuria, nocturia,  HEMATOLOGY: No anemia, easy bruising or bleeding SKIN: No rash or lesion. MUSCULOSKELETAL: No joint pain or arthritis.   NEUROLOGIC: No tingling, numbness, weakness.  PSYCHIATRY: No anxiety or depression.   DRUG ALLERGIES:  No Known Allergies  VITALS:  Blood pressure (!) 160/64, pulse 70, temperature 98.3 F (36.8 C), temperature source Oral, resp. rate 20, height 5' (1.524 m), weight 57.9 kg, SpO2 95 %.  PHYSICAL EXAMINATION:  GENERAL:  84 y.o.-year-old patient lying in the bed with no acute distress.  EYES: Pupils equal, round, reactive to light and accommodation. No scleral icterus. Extraocular muscles intact.  HEENT: Head atraumatic, normocephalic. Oropharynx and nasopharynx clear.  NECK:  Supple, no jugular venous distention. No thyroid enlargement, no tenderness.  LUNGS: Normal breath sounds bilaterally, no wheezing, rales,rhonchi or crepitation. No use of accessory muscles of respiration.  CARDIOVASCULAR: S1, S2 normal. No murmurs, rubs, or gallops.  ABDOMEN: Soft, nontender, nondistended. Bowel sounds present. No organomegaly or mass.  EXTREMITIES: No pedal edema, cyanosis, or clubbing.  NEUROLOGIC: Cranial nerves II through XII are intact. Muscle strength 5/5 in all extremities. Sensation intact. Gait not checked.   PSYCHIATRIC: The patient is alert and oriented x 3.  SKIN: No obvious rash, lesion, or ulcer.    LABORATORY PANEL:   CBC Recent Labs  Lab 04/29/18 0429  WBC 4.9  HGB 8.9*  HCT 29.5*  PLT 263   ------------------------------------------------------------------------------------------------------------------  Chemistries  Recent Labs  Lab 04/29/18 0429  NA 141  K 3.5  CL 100  CO2 31  GLUCOSE 108*  BUN 16  CREATININE 0.94  CALCIUM 8.3*   ------------------------------------------------------------------------------------------------------------------  Cardiac Enzymes Recent Labs  Lab 04/24/18 1954  TROPONINI <0.03   ------------------------------------------------------------------------------------------------------------------  RADIOLOGY:  Dg Pelvis Portable  Result Date: 04/29/2018 CLINICAL DATA:  Status post right hip replacement with surgical site drainage EXAM: PORTABLE PELVIS 1-2 VIEWS COMPARISON:  None. FINDINGS: Right hip replacement is noted in satisfactory position. No acute bony or soft tissue abnormality is seen degenerative changes of the lumbar spine are noted. IMPRESSION: No acute abnormality noted. Electronically Signed   By: Alcide Clever M.D.   On: 04/29/2018 08:43   US Venous Img Lower Unilateral Right  Result Date: 04/28/2018 CLINICAL DATA:  Right leg swelling after hip surgery. EXAM: Right LOWER EXTREMITY VENOUS DOPPLER ULTRASOUND TECHNIQUE: Gray-scale sonography with graded compression, as well as color Doppler and duplex ultrasound were performed to evaluate the lower extremity deep venous systems from the level of the common femoral vein and including the common femoral, femoral, profunda femoral, popliteal and calf veins including the posterior tibial, peroneal and gastrocnemius veins when visible. The superficial great saphenous vein was also interrogated. Spectral Doppler was utilized to evaluate flow at rest and with distal augmentation  maneuvers in the common femoral, femoral and popliteal veins. COMPARISON:  None. FINDINGS: Contralateral Common Femoral Vein:  Respiratory phasicity is normal and symmetric with the symptomatic side. No evidence of thrombus. Normal compressibility. Common Femoral Vein: No evidence of thrombus. Normal compressibility, respiratory phasicity and response to augmentation. Saphenofemoral Junction: No evidence of thrombus. Normal compressibility and flow on color Doppler imaging. Profunda Femoral Vein: No evidence of thrombus. Normal compressibility and flow on color Doppler imaging. Femoral Vein: Nonocclusive partial filling defect with incomplete compressibility of the distal right superficial femoral vein. Popliteal Vein: No evidence of thrombus. Normal compressibility, respiratory phasicity and response to augmentation. Calf Veins: Nonocclusive partial filling defect with incomplete compressibility of the calf veins. Superficial Great Saphenous Vein: No evidence of thrombus. Normal compressibility. Venous Reflux:  None. Other Findings:  None. IMPRESSION: Positive for deep venous thrombosis. Nonocclusive thrombus demonstrated in the distal superficial femoral vein and in the calf veins. These results were called by telephone at the time of interpretation on 04/28/2018 at 10:40 pm to Dr. Dionne Bucy , who verbally acknowledged these results. Electronically Signed   By: Burman Nieves M.D.   On: 04/28/2018 22:42    EKG:   Orders placed or performed during the hospital encounter of 04/20/18  . EKG 12-Lead  . EKG 12-Lead  . EKG 12-Lead  . EKG 12-Lead    ASSESSMENT AND PLAN:  84 year old female admitted for right leg swelling, found to have right leg DVT.  Patient was on Xarelto before for this and had right hip surgery and chronic A. fib.  Xarelto stopped on admission and started on Lovenox.  Patient was walking with physical therapy, has minimal pain in the right hip.  Likely discharge home tomorrow  with Eliquis. 2.  Insomnia, patient's son mentioned that patient has chronic insomnia and requesting either trazodone or Ambien tonight.  Patient already on trazodone can she can get trazodone at 10 PM. #3 essential hypertension: Controlled. 4.  COPD: No wheezing.  Continue Brovana, Pulmicort. Likely discharge home tomorrow. Discussed with patient's son. More than 50% of time spent in counseling, coordination of the care, discussed with son for about 20 minutes in addition to patient care.    All the records are reviewed and case discussed with Care Management/Social Workerr. Management plans discussed with the patient, family and they are in agreement.  CODE ' full code  TOTAL TIME TAKING CARE OF THIS PATIENT: 38 minutes.   POSSIBLE D/C IN 1-2 DAYS, DEPENDING ON CLINICAL CONDITION.   Katha Hamming M.D on 04/30/2018 at 1:06 PM  Between 7am to 6pm - Pager - [phone]  After 6pm go to [email or web address] - password [email or web address] [facility]  [facility]  Office  [phone]  CC: Primary care physician; Lollie Marrow, MD   Note: This dictation was prepared with Dragon dictation along with smaller phrase technology. Any transcriptional errors that result from this process are unintentional.

## 2018-04-30 NOTE — Progress Notes (Signed)
Pt complaining of being hot ans having shortness of breath. Spoke with Dr. Lowella Fairy stat chest x-ray

## 2018-04-30 NOTE — Progress Notes (Signed)
Pt is shivering, more pain than usual in R thigh above knee. Pt complains of tight muscle pain that is making moving difficult.. VSS. MD notified, new order for robaxin given. RN administered tylenol and Robaxin as prescribed. Will continue to monitor.

## 2018-04-30 NOTE — Progress Notes (Signed)
Pt remaining alert and oriented. Medicated for pain x1 with good results. Pt was able to sleep in between care.

## 2018-04-30 NOTE — Evaluation (Signed)
Physical Therapy Evaluation Patient Details Name: Diana Powell MRN: [id number] DOB: [birth year] Today's Date: 04/30/2018   History of Present Illness  Diana Powell is an 84yo [place] speaking female who comes to [facility] on 10/11 after DC on 10/9, Powell/o failure of wound vac, found to have acute DVT. Pt underwent 10/3 for Rt hip ORIF conversion to anterior THA. PTA, pt AMB mostly in the home with [facility], frequent close calls, and 1 fall in June with Lt wrist fracture. Pt lives with family and has AIDE 5d/week from 9-3. Pt needed assistance with ADL PTA. Pt had difficulty with pain and AMB prior to DC, but has been able to manage household distances since DC to home.   Clinical Impression  Pt admitted with above diagnosis. Pt currently with functional limitations due to the deficits listed below (see "PT Problem List"). Upon entry, pt in chair, son is present who interprets language for the patient. The pt is awake and agreeable to participate. The pt is alert and oriented x3, pleasant, conversational, and following simple commands consistently. HEavy effort required for transfers (minA as well) and AMB is taxing and slow, albeit relatively safe appearing. Functional mobility assessment demonstrates increased effort/time requirements, poor tolerance, and need for physical assistance, whereas the patient performed these at a higher level of independence PTA 2 admissions ago. Pt will benefit from skilled PT intervention to increase independence and safety with basic mobility in preparation for discharge to the venue listed below.       Follow Up Recommendations Home health PT;Supervision for mobility/OOB;Supervision - Intermittent    Equipment Recommendations  None recommended by PT    Recommendations for Other Services       Precautions / Restrictions Precautions Precautions: Fall Restrictions Weight Bearing Restrictions: Yes LUE Weight Bearing: Weight bearing as tolerated RLE Weight Bearing: Weight  bearing as tolerated      Mobility  Bed Mobility               General bed mobility comments: received up in chair  Transfers Overall transfer level: Needs assistance Equipment used: Rolling walker (2 wheeled) Transfers: Sit to/from Stand Sit to Stand: Min assist         General transfer comment: near maximal effort used.   Ambulation/Gait Ambulation/Gait assistance: Min guard Gait Distance (Feet): 100 Feet Assistive device: Rolling walker (2 wheeled) Gait Pattern/deviations: Step-to pattern;Decreased step length - right;Decreased step length - left;Decreased weight shift to right;Trunk flexed Gait velocity: 0.68m/s    General Gait Details: good sequencing and safe use of RW. Pauses periodically to rest RLE or LUE.   Stairs Stairs: (pt declines )          Wheelchair Mobility    Modified Rankin (Stroke Patients Only)       Balance Overall balance assessment: Needs assistance         Standing balance support: During functional activity Standing balance-Leahy Scale: Fair Standing balance comment: Pt relies on BUE support for dynamic activities                             Pertinent Vitals/Pain Pain Assessment: 0-10 Pain Score: 5 (when ambulating) Pain Location: R thigh  Pain Descriptors / Indicators: Aching Pain Intervention(s): Limited activity within patient's tolerance    Home Living Family/patient expects to be discharged to:: Private residence Living Arrangements: Children Available Help at Discharge: Family;Personal care attendant;Other (Comment) Type of Home: House Home Access: Stairs to enter  Entrance Stairs-Number of Steps: 2-2 inch steps Home Layout: Two level;Able to live on main level with bedroom/bathroom Home Equipment: Dan Humphreys - 2 wheels;Cane - single point;Shower seat;Hand held shower head;Wheelchair - manual;Bedside commode      Prior Function Level of Independence: Needs assistance   Gait / Transfers  Assistance Needed: RW in home, limited by SOB, pain in right thigh, and pain in Lt wrist (broke wrist 3.5 months ago, now WBAT)   ADL's / Homemaking Assistance Needed: needing A with showering in shower seat, toileting  Comments: visual difficulty d/t macular degeneration.      Hand Dominance        Extremity/Trunk Assessment   Upper Extremity Assessment Upper Extremity Assessment: Overall WFL for tasks assessed LUE Deficits / Details: previous L wrist fracture still affecting ADLs    Lower Extremity Assessment Lower Extremity Assessment: Defer to PT evaluation       Communication   Communication: Prefers language other than English  Cognition Arousal/Alertness: Awake/alert Behavior During Therapy: WFL for tasks assessed/performed Overall Cognitive Status: Within Functional Limits for tasks assessed                                 General Comments: Pt signed waiver for provided interpreter; son used as interpreter      General Comments      Exercises     Assessment/Plan    PT Assessment Patient needs continued PT services  PT Problem List Decreased strength;Decreased activity tolerance;Decreased range of motion;Decreased mobility;Cardiopulmonary status limiting activity;Decreased knowledge of use of DME;Decreased cognition       PT Treatment Interventions DME instruction;Gait training;Balance training;Functional mobility training;Stair training;Therapeutic activities;Therapeutic exercise;Patient/family education    PT Goals (Current goals can be found in the Care Plan section)  Acute Rehab PT Goals Patient Stated Goal: Pt to return home and resume PT with improved respiratory tolerance to AMB PT Goal Formulation: With patient Time For Goal Achievement: 05/20/18 Potential to Achieve Goals: Good    Frequency 7X/week   Barriers to discharge Inaccessible home environment;Decreased caregiver support      Co-evaluation               AM-PAC  PT "6 Clicks" Daily Activity  Outcome Measure Difficulty turning over in bed (including adjusting bedclothes, sheets and blankets)?: Unable Difficulty moving from lying on back to sitting on the side of the bed? : Unable Difficulty sitting down on and standing up from a chair with arms (e.g., wheelchair, bedside commode, etc,.)?: Unable Help needed moving to and from a bed to chair (including a wheelchair)?: A Little Help needed walking in hospital room?: A Little Help needed climbing 3-5 steps with a railing? : Total 6 Click Score: 10    End of Session Equipment Utilized During Treatment: Gait belt Activity Tolerance: Patient limited by fatigue;Patient limited by pain Patient left: with family/visitor present;in chair;with nursing/sitter in room Nurse Communication: Mobility status PT Visit Diagnosis: Muscle weakness (generalized) (M62.81);Unsteadiness on feet (R26.81);Difficulty in walking, not elsewhere classified (R26.2);Pain Pain - Right/Left: Right Pain - part of body: Leg    Time: 8657-8469 PT Time Calculation (min) (ACUTE ONLY): 19 min   Charges:   PT Evaluation $PT Eval Moderate Complexity: 1 Mod          12:26 PM, 04/30/18 Diana Powell, PT, DPT Physical Therapist - [facility]  [phone] (ASCOM)    Diana Powell 04/30/2018, 12:24 PM

## 2018-04-30 NOTE — Progress Notes (Signed)
Anticoagulation monitoring(Lovenox):  84 yo  Female  ordered Lovenox 60 mg Q24h  Filed Weights   04/28/18 1924 04/29/18 0549 04/30/18 0500  Weight: 139 lb 1.8 oz (63.1 kg) 127 lb (57.6 kg) 127 lb 10.3 oz (57.9 kg)   Body mass index is 24.93 kg/m.   Lab Results  Component Value Date   CREATININE 0.94 04/29/2018   CREATININE 1.03 (H) 04/28/2018   CREATININE 0.93 04/25/2018   Estimated Creatinine Clearance: 32.3 mL/min (by C-G formula based on SCr of 0.94 mg/dL). Hemoglobin & Hematocrit     Component Value Date/Time   HGB 8.9 (L) 04/29/2018 0429   HGB 11.8 (L) 10/21/2013 0548   HCT 29.5 (L) 04/29/2018 0429   HCT 36.7 10/21/2013 0548     Per Protocol for Patient with estCrcl > 30 ml/min and BMI < 40, will transition to Lovenox 60 mg Q12h.    Discussed with hospitalist.

## 2018-05-01 DIAGNOSIS — I82411 Acute embolism and thrombosis of right femoral vein: Secondary | ICD-10-CM | POA: Diagnosis not present

## 2018-05-01 LAB — GLUCOSE, CAPILLARY: Glucose-Capillary: 113 mg/dL — ABNORMAL HIGH (ref 70–99)

## 2018-05-01 MED ORDER — TRAZODONE HCL 50 MG PO TABS: 50.0000 mg | ORAL_TABLET | Freq: Every evening | ORAL | 0 refills | Status: DC | PRN

## 2018-05-01 MED ORDER — APIXABAN 5 MG PO TABS
10.0000 mg | ORAL_TABLET | Freq: Two times a day (BID) | ORAL | Status: DC
  Administered 2018-05-01: 10 mg via ORAL
  Filled 2018-05-01: qty 2

## 2018-05-01 MED ORDER — APIXABAN 5 MG PO TABS: 10.0000 mg | ORAL_TABLET | Freq: Two times a day (BID) | ORAL | 1 refills | Status: DC

## 2018-05-01 NOTE — Progress Notes (Signed)
Pt sleeping soundly at this time.

## 2018-05-01 NOTE — Discharge Summary (Signed)
Diana Powell, is a 84 y.o. female  DOB Jun 23, 1927  MRN [id number].  Admission date:  04/28/2018  Admitting Physician  Cammy Copa, MD  Discharge Date:  05/01/2018   Primary MD  Lollie Marrow, MD  Recommendations for primary care physician for things to follow:   Follow-up with PCP in 1 week   Admission Diagnosis  Acute deep vein thrombosis (DVT) of proximal vein of right lower extremity (HCC) [I82.4Y1] Acute DVT (deep venous thrombosis) (HCC) [I82.409]   Discharge Diagnosis  Acute deep vein thrombosis (DVT) of proximal vein of right lower extremity (HCC) [I82.4Y1] Acute DVT (deep venous thrombosis) (HCC) [I82.409]    Active Problems:   Acute DVT (deep venous thrombosis) (HCC)      Past Medical History:  Diagnosis Date  . Atrial fibrillation (HCC)   . Chronic systolic heart failure (HCC)   . COPD (chronic obstructive pulmonary disease) (HCC)   . Osteoarthritis, multiple sites   . Osteoporosis, post-menopausal   . Presence of permanent cardiac pacemaker     Past Surgical History:  Procedure Laterality Date  . CARDIOVERSION  ~2009  . HIP FRACTURE SURGERY Bilateral [phone]   each hip fractured at separate times  . INSERT / REPLACE / REMOVE PACEMAKER    . TOTAL HIP ARTHROPLASTY Right 04/20/2018   Procedure: TOTAL HIP CONVERSION;  Surgeon: Kennedy Bucker, MD;  Location: [facility] ORS;  Service: Orthopedics;  Laterality: Right;       History of present illness and  Hospital Course:     Kindly see H&P for history of present illness and admission details, please review complete Labs, Consult reports and Test reports for all details in brief  HPI  from the history and physical done on the day of admission 84 year old female patient with history of recent right hip surgery 8 days ago brought in because of low  drainage coming out of surgical wound from the right hip for 24 hours associated with swelling of the right leg and the pain.  Patient found to have acute DVT in the distal superficial femoral vein and admitted for acute DVT.  Patient was taking Xarelto for long time for atrial fibrillation.  Hospital Course  Acute right leg DVT on Xarelto: Patient Xarelto was stopped on admission and started on Lovenox, patient received Lovenox since admission at 1mg /kg q 12, started on Eliquis today patient to take Eliquis 10 mg p.o. twice daily for 1 week followed by 5 mg p.o. twice daily thereafter, discussed this with patient's son in detail.  Patient needs to follow-up with PCP in 1 week. 2.  History of right hip replacement, patient had some drainage at the incisional site at right hip arthroplasty site, seen by orthopedic on admission, bandages applied, patient had no evidence of infection. 3.  Chronic systolic heart failure, patient is on Lasix at home continue with that.  No evidence of hypoxia or CHF later on this admission. For insomnia, seems to be main complaint as per patient son requested limited prescription of trazodone.  He will use if patient is having difficulty falling asleep . All thru  hospitalization patient son had multiple questions, answered all his questions.  Because of recent hip surgery, seen by physical therapy recommended home health physical therapy.  Discharge home with home health physical therapy. Discharge Condition: Stable   Follow UP  Follow-up Information    Lollie Marrow, MD. Schedule an appointment as soon as possible for a visit on 05/10/2018.   Specialty:  Geriatric  Medicine Why:  [name], NP;  @ 12:45 pm Contact information: [address] Marzella Schlein [address] [place] [address] [phone]             Discharge Instructions  and  Discharge Medications      Allergies as of 05/01/2018   No Known Allergies     Medication List    STOP taking  these medications   XARELTO 15 MG Tabs tablet Generic drug:  Rivaroxaban     TAKE these medications   acetaminophen 500 MG tablet Commonly known as:  TYLENOL Take 1,000 mg by mouth every 8 (eight) hours as needed for moderate pain.   amLODipine 5 MG tablet Commonly known as:  NORVASC Take 5 mg by mouth daily.   apixaban 5 MG Tabs tablet Commonly known as:  ELIQUIS Take 2 tablets (10 mg total) by mouth 2 (two) times daily for 7 days. Take apixaban 10 mg twice daily for 7 days followed by 5 mg p.o. twice daily after that.   budesonide 0.5 MG/2ML nebulizer solution Commonly known as:  PULMICORT Take 0.5 mg by nebulization daily.   calcium-vitamin D 500-200 MG-UNIT tablet Take 1 tablet by mouth daily.   clotrimazole 1 % cream Commonly known as:  LOTRIMIN Apply 1 application topically daily as needed (irritation).   docusate sodium 100 MG capsule Commonly known as:  COLACE Take 1 capsule (100 mg total) by mouth daily as needed.   ferrous sulfate 325 (65 FE) MG tablet Take 325 mg by mouth daily with breakfast.   formoterol 20 MCG/2ML nebulizer solution Commonly known as:  PERFOROMIST Inhale 20 mcg into the lungs every 12 (twelve) hours.   furosemide 20 MG tablet Commonly known as:  LASIX Take 20 mg by mouth daily. nORMALLY 40MG  IN THE MORNING AND 20 MG IN THE EVENING AS NEEDED ONLY   HYDROcodone-acetaminophen 5-325 MG tablet Commonly known as:  NORCO/VICODIN Take 1 tablet by mouth every 8 (eight) hours as needed for moderate pain (pain score 4-6).   potassium chloride 10 MEQ tablet Commonly known as:  K-DUR Take 10 mEq by mouth daily.   traMADol 50 MG tablet Commonly known as:  ULTRAM Take 0.5 tablets (25 mg total) by mouth every 6 (six) hours as needed. What changed:    how much to take  reasons to take this  Another medication with the same name was removed. Continue taking this medication, and follow the directions you see here.   traZODone 50 MG  tablet Commonly known as:  DESYREL Take 1 tablet (50 mg total) by mouth at bedtime as needed for sleep.   vitamin B-12 1000 MCG tablet Commonly known as:  CYANOCOBALAMIN Take 1,000 mcg by mouth daily.         Diet and Activity recommendation: See Discharge Instructions above   Consults obtained -orthopedic, physical therapy   Major procedures and Radiology Reports - PLEASE review detailed and final reports for all details, in brief -      Dg Pelvis Portable  Result Date: 04/29/2018 CLINICAL DATA:  Status post right hip replacement with surgical site drainage EXAM: PORTABLE PELVIS 1-2 VIEWS COMPARISON:  None. FINDINGS: Right hip replacement is noted in satisfactory position. No acute bony or soft tissue abnormality is seen degenerative changes of the lumbar spine are noted. IMPRESSION: No acute abnormality noted. Electronically Signed   By: Alcide Clever M.D.   On: 04/29/2018 08:43   US Venous Img Lower Unilateral Right  Result Date: 04/28/2018 CLINICAL DATA:  Right leg swelling after hip surgery. EXAM: Right LOWER EXTREMITY VENOUS DOPPLER ULTRASOUND TECHNIQUE: Gray-scale sonography with graded compression, as well as color Doppler and duplex ultrasound were performed to evaluate the lower extremity deep venous systems from the level of the common femoral vein and including the common femoral, femoral, profunda femoral, popliteal and calf veins including the posterior tibial, peroneal and gastrocnemius veins when visible. The superficial great saphenous vein was also interrogated. Spectral Doppler was utilized to evaluate flow at rest and with distal augmentation maneuvers in the common femoral, femoral and popliteal veins. COMPARISON:  None. FINDINGS: Contralateral Common Femoral Vein: Respiratory phasicity is normal and symmetric with the symptomatic side. No evidence of thrombus. Normal compressibility. Common Femoral Vein: No evidence of thrombus. Normal compressibility, respiratory  phasicity and response to augmentation. Saphenofemoral Junction: No evidence of thrombus. Normal compressibility and flow on color Doppler imaging. Profunda Femoral Vein: No evidence of thrombus. Normal compressibility and flow on color Doppler imaging. Femoral Vein: Nonocclusive partial filling defect with incomplete compressibility of the distal right superficial femoral vein. Popliteal Vein: No evidence of thrombus. Normal compressibility, respiratory phasicity and response to augmentation. Calf Veins: Nonocclusive partial filling defect with incomplete compressibility of the calf veins. Superficial Great Saphenous Vein: No evidence of thrombus. Normal compressibility. Venous Reflux:  None. Other Findings:  None. IMPRESSION: Positive for deep venous thrombosis. Nonocclusive thrombus demonstrated in the distal superficial femoral vein and in the calf veins. These results were called by telephone at the time of interpretation on 04/28/2018 at 10:40 pm to Dr. Dionne Bucy , who verbally acknowledged these results. Electronically Signed   By: Burman Nieves M.D.   On: 04/28/2018 22:42   Dg Chest Port 1 View  Result Date: 04/30/2018 CLINICAL DATA:  Shortness of breath. Atrial fibrillation. Congestive heart failure. COPD. EXAM: PORTABLE CHEST 1 VIEW COMPARISON:  04/24/2018 FINDINGS: Moderate cardiomegaly remains stable. Pacemaker remains in appropriate position. Aortic atherosclerosis. Pulmonary hyperinflation is again seen, consistent with COPD. No evidence of acute infiltrate or edema. No evidence of pleural effusion or pneumothorax. Severe thoracic dextroscoliosis again noted. IMPRESSION: Stable cardiomegaly and COPD.  No active lung disease. Electronically Signed   By: Myles Rosenthal M.D.   On: 04/30/2018 23:19   Dg Chest Port 1 View  Result Date: 04/24/2018 CLINICAL DATA:  Cough.  Chest pain.  Shortness of breath. EXAM: PORTABLE CHEST 1 VIEW COMPARISON:  Radiograph 10/21/2013 FINDINGS: Left-sided  pacemaker with leads overlying the right atrium, ventricle, and coronary sinus. Mild cardiomegaly. Pulmonary edema interstitial opacities and Kerley B-lines. Suspect small pleural effusions. Retrocardiac opacity may be atelectasis or soft tissue attenuation due to scoliosis. No pneumothorax. Bones are under mineralized. Moderate scoliotic curvature of the thoracic spine. IMPRESSION: Cardiomegaly with pulmonary edema. Small pleural effusions suspected. Electronically Signed   By: Narda Rutherford M.D.   On: 04/24/2018 04:46   Dg Hip Operative Unilat W Or W/o Pelvis Right  Result Date: 04/20/2018 CLINICAL DATA:  Intraoperative imaging for patient undergoing a right hip replacement. EXAM: OPERATIVE RIGHT HIP (WITH PELVIS IF PERFORMED) single VIEWS TECHNIQUE: Fluoroscopic spot image(s) were submitted for interpretation post-operatively. COMPARISON:  None. FINDINGS: Two fluoroscopic spot views of the right hip in the AP projection demonstrate a total hip arthroplasty in place. Gas in the soft tissues from surgery is noted. No acute abnormality is identified. IMPRESSION: Intraoperative imaging for right hip replacement.  No acute finding. Electronically Signed   By: Drusilla Kanner M.D.   On: 04/20/2018 13:24   Dg Hip Unilat  W Or W/o Pelvis 2-3 Views Right  Result Date: 04/20/2018 CLINICAL DATA:  Postoperative radiograph. EXAM: DG HIP (WITH OR WITHOUT PELVIS) 2-3V RIGHT COMPARISON:  None. FINDINGS: Post recent total right hip arthroplasty with normal alignment of the orthopedic hardware. Expected soft tissue emphysema and edema. Skin staples in place. Vascular calcifications are noted. IMPRESSION: Post total right hip arthroplasty without evidence of immediate complications. Electronically Signed   By: Ted Mcalpine M.D.   On: 04/20/2018 14:24    Micro Results     Recent Results (from the past 240 hour(s))  Urine culture     Status: None   Collection Time: 04/25/18 11:03 AM  Result Value Ref Range  Status   Specimen Description   Final    URINE, RANDOM Performed at [facility], [address], [place], [place] [address]    Special Requests   Final    NONE Performed at [facility], [address] [place], [place] [address]    Culture   Final    NO GROWTH Performed at [facility], [address] [place], [place] [address]    Report Status 04/26/2018 FINAL  Final       Today   Subjective:   Diana Powell today feels well, stable for discharge  Objective:   Blood pressure (!) 117/56, pulse 70, temperature 98.2 F (36.8 C), temperature source Oral, resp. rate 18, height 5' (1.524 m), weight 57.9 kg, SpO2 95 %.  No intake or output data in the 24 hours ending 05/01/18 1128  Exam Awake Alert, Oriented x 3, No new F.N deficits, Normal affect [place].AT,PERRAL Supple Neck,No JVD, No cervical lymphadenopathy appriciated.  Symmetrical Chest wall movement, Good air movement bilaterally, CTAB RRR,No Gallops,Rubs or new Murmurs, No Parasternal Heave +ve B.Sounds, Abd Soft, Non tender, No organomegaly appriciated, No rebound -guarding or rigidity. No Cyanosis, Clubbing or edema, No new Rash or bruise  Data Review   CBC w Diff:  Lab Results  Component Value Date   WBC 4.9 04/29/2018   HGB 8.9 (L) 04/29/2018   HGB 11.8 (L) 10/21/2013   HCT 29.5 (L) 04/29/2018   HCT 36.7 10/21/2013   PLT 263 04/29/2018   PLT 255 10/21/2013   LYMPHOPCT 20 04/28/2018   MONOPCT 8 04/28/2018   EOSPCT 6 04/28/2018   BASOPCT 0 04/28/2018    CMP:  Lab Results  Component Value Date   NA 141 04/29/2018   NA 136 10/22/2013   K 3.5 04/29/2018   K 3.5 10/22/2013   CL 100 04/29/2018   CL 100 10/22/2013   CO2 31 04/29/2018   CO2 32 10/22/2013   BUN 16 04/29/2018   BUN 20 (H) 10/22/2013   CREATININE 0.94 04/29/2018   CREATININE 1.19 10/22/2013  .   Total Time in preparing paper work, data evaluation and todays exam - 35 minutes  Katha Hamming M.D on  05/01/2018 at 11:28 AM    Note: This dictation was prepared with Dragon dictation along with smaller phrase technology. Any transcriptional errors that result from this process are unintentional.

## 2018-05-01 NOTE — Plan of Care (Signed)
Problem: Phase I Progression Outcomes Goal: Pain controlled with appropriate interventions Outcome: Progressing     

## 2018-05-01 NOTE — Progress Notes (Signed)
Pt remaining restless and moaning son requesting pain medication for pt.

## 2018-05-01 NOTE — Progress Notes (Signed)
Discharge instructions given to pt's daughter in law. No questions at this time. Pt alert asleep in room. Waiting to hear back from case manager then will assist pt with getting dressed and discharge home with son and daughter in law.

## 2018-05-02 NOTE — Progress Notes (Signed)
Called by the nurse taking care, patient prescriptions are printed yesterday but was not given to the patient according to patient's son, he claims did not get discharge instructions her discharge medications, patient Eliquis has been printed but I recall that patient did not receive prescriptions for Eliquis.  It says that printed prescriptions for Eliquis and my discharge summary.

## 2018-05-15 ENCOUNTER — Ambulatory Visit: Payer: Medicare Other | Attending: Orthopedic Surgery

## 2018-05-15 DIAGNOSIS — M25551 Pain in right hip: Secondary | ICD-10-CM

## 2018-05-15 DIAGNOSIS — R262 Difficulty in walking, not elsewhere classified: Secondary | ICD-10-CM | POA: Diagnosis present

## 2018-05-15 DIAGNOSIS — M6281 Muscle weakness (generalized): Secondary | ICD-10-CM | POA: Insufficient documentation

## 2018-05-15 NOTE — Therapy (Signed)
[facility] [address], [place], [address] Phone: [phone]   Fax:  [phone]  Physical Therapy Evaluation  Patient Details  Name: Diana Powell MRN: [id number] Date of Birth: 12-12-1926 Referring Provider (PT): Floyce Stakes   Encounter Date: 05/15/2018  PT End of Session - 05/15/18 1623    Visit Number  1    Number of Visits  13    Date for PT Re-Evaluation  06/26/18    Authorization Type  1 /10 G code    PT Start Time  1430    PT Stop Time  1530    PT Time Calculation (min)  60 min    Equipment Utilized During Treatment  Gait belt    Activity Tolerance  Patient limited by fatigue    Behavior During Therapy  [facility] for tasks assessed/performed       Past Medical History:  Diagnosis Date  . Atrial fibrillation (HCC)   . Chronic systolic heart failure (HCC)   . COPD (chronic obstructive pulmonary disease) (HCC)   . Osteoarthritis, multiple sites   . Osteoporosis, post-menopausal   . Presence of permanent cardiac pacemaker     Past Surgical History:  Procedure Laterality Date  . CARDIOVERSION  ~2009  . HIP FRACTURE SURGERY Bilateral [phone]   each hip fractured at separate times  . INSERT / REPLACE / REMOVE PACEMAKER    . TOTAL HIP ARTHROPLASTY Right 04/20/2018   Procedure: TOTAL HIP CONVERSION;  Surgeon: Kennedy Bucker, MD;  Location: [facility] ORS;  Service: Orthopedics;  Laterality: Right;    There were no vitals filed for this visit.   Subjective Assessment - 05/15/18 1613    Subjective  Patient s/p R  anterior aproach THA on 04/20/2018 with removal of previous placed ORIF from 1996. Patient states increased pain and spasms with laying on her bed at night and elevating her R LE. Patient reports increased LBP and R hip pain with performing these motions. Patient states she usually sit or lies in bed all day but is able to ambulate to the kitchen. Patient states she often feels fatigued when  ambulating from having a heart and lung condition. Patient states improvement in pain since the surgery but reports her R LE in shorter than her L LE. Patient states she would like to walk around at home with greater freedom. Patient reports she does not perform exercises often and when she does, it is unstructured. Patient states increased difficulty with standing, walking, and performing sit to stands. Patient reports she ambulates with a FWW but used a SPC before the surgery.     Patient is accompained by:  Interpreter;Family member   Son and seperate interpreter   Pertinent History  CVA with perserved EF, self-reported 'Lung condition', R THA, DVT, wrist fx    Limitations  Lifting;Standing;Walking    Patient Stated Goals  To walk around easier    Currently in Pain?  No/denies    Pain Score  0-No pain   worst pain: 10/10; best 0/10   Pain Location  Hip    Pain Orientation  Right    Pain Descriptors / Indicators  Aching    Pain Type  Surgical pain    Pain Onset  More than a month ago    Pain Frequency  Intermittent         [facility] PT Assessment - 05/15/18 1639      Assessment   Medical Diagnosis  R THA  Referring Provider (PT)  Floyce Stakes    Onset Date/Surgical Date  04/20/18    Hand Dominance  Right    Next MD Visit  unknown    Prior Therapy  yes - wrist      Balance Screen   Has the patient fallen in the past 6 months  No    Has the patient had a decrease in activity level because of a fear of falling?   Yes    Is the patient reluctant to leave their home because of a fear of falling?   No      Prior Function   Level of Independence  Independent    Vocation  Retired    Gaffer  N/A    Leisure  watching TV      Cognition   Overall Cognitive Status  Within Functional Limits for tasks assessed      Observation/Other Assessments   Observations  Heavy use of UE with FWW      Sensation   Light Touch  Appears Intact      Functional Tests   Functional tests   Sit to Stand      Sit to Stand   Comments  Heavy use of UEs to perform      Posture/Postural Control   Posture Comments  Slumped over posture      ROM / Strength   AROM / PROM / Strength  AROM;Strength      AROM   Overall AROM Comments  All measurements taken in sitting    AROM Assessment Site  Hip    Right/Left Hip  Right;Left    Right Hip Flexion  100    Right Hip ABduction  10    Right Hip ADduction  10    Left Hip Flexion  100    Left Hip ABduction  10    Left Hip ADduction  10      Strength   Strength Assessment Site  Knee;Hip    Right/Left Hip  Right;Left    Right Hip Flexion  3+/5    Right Hip External Rotation   3+/5    Right Hip ABduction  3+/5    Right Hip ADduction  3+/5    Left Hip Flexion  4-/5    Left Hip External Rotation  3+/5    Left Hip ABduction  3/5    Left Hip ADduction  3/5    Right/Left Knee  Right;Left    Right Knee Flexion  4/5    Right Knee Extension  3+/5    Left Knee Flexion  4/5    Left Knee Extension  4/5      Palpation   Palpation comment  Increased TTP along R quad      Ambulation/Gait   Gait Comments  Use of FWW; decreased step length B,heavy use of hands with amb, decreased foot clearance B      Standardized Balance Assessment   Standardized Balance Assessment  Timed Up and Go Test      Timed Up and Go Test   Normal TUG (seconds)  68        Objective measurements completed on examination: See above findings.   TREATMENT Therapeutic Exercise: LAQ -- x5 performed in sitting Sit to stand -- x 5 Clamshells in sitting with YTB -- x 10   Patient demonstrates increased fatigue at the end of the session      PT Education - 05/15/18 1622    Education Details  HEP:  seated LAQ, walking at home, seated clamshells    Person(s) Educated  Patient    Methods  Demonstration;Explanation;Handout    Comprehension  Returned demonstration;Verbalized understanding          PT Long Term Goals - 05/15/18 1631      PT LONG TERM  GOAL #1   Title  Patient will be independent with HEP to continue benefits of therapy until after discharge.     Baseline  Dependent with form/technique    Time  6    Period  Weeks    Status  New    Target Date  06/26/18      PT LONG TERM GOAL #2   Title  Patient will be able to walk 168ft with least resistive AD without a sitting rest break to better ambulate at home without requiring rest breaks    Baseline  25ft    Time  6    Period  Weeks    Status  New    Target Date  06/26/18      PT LONG TERM GOAL #3   Title  Patient will be able to perform the TUG in under 30sec to indicate singificant improvement gait speed and dynamic balance with use of FWW    Baseline  68sec    Time  6    Period  Weeks    Status  New    Target Date  06/26/18      PT LONG TERM GOAL #4   Title  Patient will have a worst pain of 3/10 to indicate significant improvement in pain and spasms and be able to perform functional activities without increase in pain.     Baseline  10/10 worst pain    Time  6    Period  Weeks    Status  New    Target Date  06/26/18             Plan - 05/15/18 1624    Clinical Impression Statement  Patient is 84 yo female presenting with increased R hip pain and difficulty ambulating s/p R THA from 04/20/2018. Patient demonstrates increased hip dysfunction with increased balancing difficulties. Patient also demosntrates increased R LE weakness contributing to poor balance. Patient also demosntrates poor balance with decreased ability to stand without use of UE support on the walker. Patient also demonstrates decreased endurance and will benefit from further skilled therapy to return to prior level of function.      History and Personal Factors relevant to plan of care:  Previous DVT, THA, CVA, lung disorder    Clinical Presentation  Evolving    Clinical Presentation due to:  Pain not consistent    Clinical Decision Making  Moderate    Rehab Potential  Fair    Clinical  Impairments Affecting Rehab Potential  (+) highly motivated, family support (-) age, coormorbities    PT Frequency  2x / week    PT Duration  6 weeks    PT Treatment/Interventions  Electrical Stimulation;Iontophoresis 4mg /ml Dexamethasone;Aquatic Therapy;Cryotherapy;Moist Heat;Therapeutic activities;Patient/family education;Manual techniques;Balance training;Stair training;Gait training;Neuromuscular re-education;Therapeutic exercise;Passive range of motion    PT Next Visit Plan  Progress strengthening and balance    PT Home Exercise Plan  See education section    Consulted and Agree with Plan of Care  Patient       Patient will benefit from skilled therapeutic intervention in order to improve the following deficits and impairments:  Abnormal gait, Pain, Decreased coordination, Decreased mobility, Increased muscle spasms,  Postural dysfunction, Decreased endurance, Decreased range of motion, Decreased strength, Decreased balance, Difficulty walking  Visit Diagnosis: Muscle weakness (generalized)  Pain in right hip  Difficulty in walking, not elsewhere classified     Problem List Patient Active Problem List   Diagnosis Date Noted  . Acute DVT (deep venous thrombosis) (HCC) 04/29/2018  . 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 Atrial fibrillation (HCC)   . Chronic systolic heart failure (HCC)   . Osteoporosis, post-menopausal   . Osteoarthritis, multiple sites     [name], PT DPT 05/15/2018, 4:50 PM  [facility] [address] [place], [address] Phone: [phone]   Fax:  [phone]  Name: Diana Powell MRN: [id number] Date of Birth: Mar 26, 1927

## 2018-05-17 ENCOUNTER — Ambulatory Visit: Payer: Medicare Other

## 2018-05-17 DIAGNOSIS — M6281 Muscle weakness (generalized): Secondary | ICD-10-CM

## 2018-05-17 DIAGNOSIS — M25551 Pain in right hip: Secondary | ICD-10-CM

## 2018-05-17 DIAGNOSIS — R262 Difficulty in walking, not elsewhere classified: Secondary | ICD-10-CM

## 2018-05-17 NOTE — Therapy (Signed)
[facility] [address], [place], [address] Phone: [phone]   Fax:  [phone]  Physical Therapy Treatment  Patient Details  Name: Diana Powell MRN: [id number] Date of Birth: 1926-11-10 Referring Provider (PT): Floyce Stakes   Encounter Date: 05/17/2018  PT End of Session - 05/17/18 1653    Visit Number  2    Number of Visits  13    Date for PT Re-Evaluation  06/26/18    Authorization Type  2 /10 G code    PT Start Time  1515    PT Stop Time  1600    PT Time Calculation (min)  45 min    Equipment Utilized During Treatment  Gait belt    Activity Tolerance  Patient limited by fatigue    Behavior During Therapy  [facility] for tasks assessed/performed       Past Medical History:  Diagnosis Date  . Atrial fibrillation (HCC)   . Chronic systolic heart failure (HCC)   . COPD (chronic obstructive pulmonary disease) (HCC)   . Osteoarthritis, multiple sites   . Osteoporosis, post-menopausal   . Presence of permanent cardiac pacemaker     Past Surgical History:  Procedure Laterality Date  . CARDIOVERSION  ~2009  . HIP FRACTURE SURGERY Bilateral [phone]   each hip fractured at separate times  . INSERT / REPLACE / REMOVE PACEMAKER    . TOTAL HIP ARTHROPLASTY Right 04/20/2018   Procedure: TOTAL HIP CONVERSION;  Surgeon: Kennedy Bucker, MD;  Location: [facility] ORS;  Service: Orthopedics;  Laterality: Right;    There were no vitals filed for this visit.  Subjective Assessment - 05/17/18 1651    Subjective  Patient states she has been performing exercises at home and felt sore after doing a lot of exercises today but does not have any pain.    interpreted by son   Patient is accompained by:  Interpreter;Family member   Son and seperate interpreter   Pertinent History  CVA with perserved EF, self-reported 'Lung condition', R THA, DVT, wrist fx    Limitations  Lifting;Standing;Walking    Patient Stated Goals  To walk  around easier    Currently in Pain?  No/denies    Pain Onset  More than a month ago          TREATMENT Therapeutic Exercise: Sit to stand -  x 4, x5 with therapist support for UE Standing weight shifts  -- 2 x 10 with FWW placed in front for stability LAQ in sitting - 2 x 6 Hip flexion in sitting - 2 x 7 Ambulation 141ft with FWW required CGA to perform from PT - focus on improving speed Hip adduction with 4# weight - x 10 with glute squeeze  Patient demonstrates increased fatigue at the end of the session      PT Education - 05/17/18 1652    Education Details  HEP: weight shifts in standing; hip flexion in sitting    Person(s) Educated  Patient    Methods  Explanation;Demonstration    Comprehension  Verbalized understanding;Returned demonstration          PT Long Term Goals - 05/15/18 1631      PT LONG TERM GOAL #1   Title  Patient will be independent with HEP to continue benefits of therapy until after discharge.     Baseline  Dependent with form/technique    Time  6    Period  Weeks    Status  New    Target Date  06/26/18      PT LONG TERM GOAL #2   Title  Patient will be able to walk 19ft with least resistive AD without a sitting rest break to better ambulate at home without requiring rest breaks    Baseline  58ft    Time  6    Period  Weeks    Status  New    Target Date  06/26/18      PT LONG TERM GOAL #3   Title  Patient will be able to perform the TUG in under 30sec to indicate singificant improvement gait speed and dynamic balance with use of FWW    Baseline  68sec    Time  6    Period  Weeks    Status  New    Target Date  06/26/18      PT LONG TERM GOAL #4   Title  Patient will have a worst pain of 3/10 to indicate significant improvement in pain and spasms and be able to perform functional activities without increase in pain.     Baseline  10/10 worst pain    Time  6    Period  Weeks    Status  New    Target Date  06/26/18             Plan - 05/17/18 1654    Clinical Impression Statement  Patient demonstrates improvement with gait speed today and is able to perform greater amount of exercises before onset of fatigue. Although patient is improving, she continues to demonstrates increased fatigue and significant weakness along knee/hip motions of the R LE. Patient will benefit from further skilled therapy to return to prior level of function.     Rehab Potential  Fair    Clinical Impairments Affecting Rehab Potential  (+) highly motivated, family support (-) age, coormorbities    PT Frequency  2x / week    PT Duration  6 weeks    PT Treatment/Interventions  Electrical Stimulation;Iontophoresis 4mg /ml Dexamethasone;Aquatic Therapy;Cryotherapy;Moist Heat;Therapeutic activities;Patient/family education;Manual techniques;Balance training;Stair training;Gait training;Neuromuscular re-education;Therapeutic exercise;Passive range of motion    PT Next Visit Plan  Progress strengthening and balance    PT Home Exercise Plan  See education section    Consulted and Agree with Plan of Care  Patient       Patient will benefit from skilled therapeutic intervention in order to improve the following deficits and impairments:  Abnormal gait, Pain, Decreased coordination, Decreased mobility, Increased muscle spasms, Postural dysfunction, Decreased endurance, Decreased range of motion, Decreased strength, Decreased balance, Difficulty walking  Visit Diagnosis: Pain in right hip  Muscle weakness (generalized)  Difficulty in walking, not elsewhere classified     Problem List Patient Active Problem List   Diagnosis Date Noted  . Acute DVT (deep venous thrombosis) (HCC) 04/29/2018  . 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 Atrial fibrillation (HCC)   . Chronic systolic heart failure (HCC)   . Osteoporosis, post-menopausal    . Osteoarthritis, multiple sites     [name], PT DPT 05/17/2018, 4:56 PM  [place] [facility] SPOR[facility]ICINE [address] [place], [address] Phone: [phone]   Fax:  [phone]  Name: Diana Powell MRN: [id number] Date of Birth: [birth year]

## 2018-05-22 ENCOUNTER — Ambulatory Visit: Payer: Medicare Other | Attending: Orthopedic Surgery

## 2018-05-22 DIAGNOSIS — M6281 Muscle weakness (generalized): Secondary | ICD-10-CM

## 2018-05-22 DIAGNOSIS — R262 Difficulty in walking, not elsewhere classified: Secondary | ICD-10-CM | POA: Diagnosis present

## 2018-05-22 DIAGNOSIS — M25551 Pain in right hip: Secondary | ICD-10-CM | POA: Diagnosis present

## 2018-05-22 NOTE — Therapy (Signed)
[place] [facility] [address] [place], [address] Phone: [phone]   Fax:  [phone]  Physical Therapy Treatment  Patient Details  Name: Diana Powell MRN: [id number] Date of Birth: 11-09-1926 Referring Provider (PT): Floyce Stakes   Encounter Date: 05/22/2018  PT End of Session - 05/22/18 1512    Visit Number  3    Number of Visits  13    Date for PT Re-Evaluation  06/26/18    Authorization Type  3 /10 G code    PT Start Time  1345    PT Stop Time  1430    PT Time Calculation (min)  45 min    Equipment Utilized During Treatment  Gait belt    Activity Tolerance  Patient limited by fatigue    Behavior During Therapy  [facility] for tasks assessed/performed       Past Medical History:  Diagnosis Date  . Atrial fibrillation (HCC)   . Chronic systolic heart failure (HCC)   . COPD (chronic obstructive pulmonary disease) (HCC)   . Osteoarthritis, multiple sites   . Osteoporosis, post-menopausal   . Presence of permanent cardiac pacemaker     Past Surgical History:  Procedure Laterality Date  . CARDIOVERSION  ~2009  . HIP FRACTURE SURGERY Bilateral [phone]   each hip fractured at separate times  . INSERT / REPLACE / REMOVE PACEMAKER    . TOTAL HIP ARTHROPLASTY Right 04/20/2018   Procedure: TOTAL HIP CONVERSION;  Surgeon: Kennedy Bucker, MD;  Location: [facility] ORS;  Service: Orthopedics;  Laterality: Right;    There were no vitals filed for this visit.  Subjective Assessment - 05/22/18 1510    Subjective  Patient reports she has been performing  her exercises and states she gets tired easily.    interpreted by son   Patient is accompained by:  Interpreter;Family member   Son and seperate interpreter   Pertinent History  CVA with perserved EF, self-reported 'Lung condition', R THA, DVT, wrist fx    Limitations  Lifting;Standing;Walking    Patient Stated Goals  To walk around easier    Currently in Pain?  No/denies     Pain Onset  More than a month ago       TREATMENT Therapeutic Exercise: Sit to stand -   x 6 with therapist support for UE Standing weight shifts  --  x 10 with FWW placed in front for stability LAQ in sitting - 2 x 10 Hip flexion in sitting - 2 x 10 Ambulation 259ft with FWW required CGA to perform from PT - focus on improving speed Seated Hip ER - 2 x 5 on the R LE  Hip adduction with 4# weight - x 10 with glute squeeze   Patient demonstrates increased fatigue at the end of the session   PT Education - 05/22/18 1511    Education Details  Hip ER, weight shifts in standing    Person(s) Educated  Patient    Methods  Explanation;Demonstration    Comprehension  Verbalized understanding;Returned demonstration          PT Long Term Goals - 05/15/18 1631      PT LONG TERM GOAL #1   Title  Patient will be independent with HEP to continue benefits of therapy until after discharge.     Baseline  Dependent with form/technique    Time  6    Period  Weeks    Status  New    Target  Date  06/26/18      PT LONG TERM GOAL #2   Title  Patient will be able to walk 170ft with least resistive AD without a sitting rest break to better ambulate at home without requiring rest breaks    Baseline  47ft    Time  6    Period  Weeks    Status  New    Target Date  06/26/18      PT LONG TERM GOAL #3   Title  Patient will be able to perform the TUG in under 30sec to indicate singificant improvement gait speed and dynamic balance with use of FWW    Baseline  68sec    Time  6    Period  Weeks    Status  New    Target Date  06/26/18      PT LONG TERM GOAL #4   Title  Patient will have a worst pain of 3/10 to indicate significant improvement in pain and spasms and be able to perform functional activities without increase in pain.     Baseline  10/10 worst pain    Time  6    Period  Weeks    Status  New    Target Date  06/26/18            Plan - 05/22/18 1512    Clinical  Impression Statement  Patient demonstrates improvement with walking distance and ability to perform greater amount of exercises compared to previous sessions indicating funcitonal carryover between sessions. Patient demonstrates improvement overall however fatigues very quickly and requires CGA assist for standing exercises. Patient will benefit from further skilled therapy to return to prior level of function.     Rehab Potential  Fair    Clinical Impairments Affecting Rehab Potential  (+) highly motivated, family support (-) age, coormorbities    PT Frequency  2x / week    PT Duration  6 weeks    PT Treatment/Interventions  Electrical Stimulation;Iontophoresis 4mg /ml Dexamethasone;Aquatic Therapy;Cryotherapy;Moist Heat;Therapeutic activities;Patient/family education;Manual techniques;Balance training;Stair training;Gait training;Neuromuscular re-education;Therapeutic exercise;Passive range of motion    PT Next Visit Plan  Progress strengthening and balance    PT Home Exercise Plan  See education section    Consulted and Agree with Plan of Care  Patient       Patient will benefit from skilled therapeutic intervention in order to improve the following deficits and impairments:  Abnormal gait, Pain, Decreased coordination, Decreased mobility, Increased muscle spasms, Postural dysfunction, Decreased endurance, Decreased range of motion, Decreased strength, Decreased balance, Difficulty walking  Visit Diagnosis: Pain in right hip  Muscle weakness (generalized)  Difficulty in walking, not elsewhere classified     Problem List Patient Active Problem List   Diagnosis Date Noted  . Acute DVT (deep venous thrombosis) (HCC) 04/29/2018  . 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 Atrial fibrillation (HCC)   . Chronic systolic heart failure (HCC)   . Osteoporosis, post-menopausal   .  Osteoarthritis, multiple sites     [name], PT DPT 05/22/2018, 3:15 PM  [place] [facility] [address], [place], [address] Phone: [phone]   Fax:  [phone]  Name: Anneke Cundy MRN: [id number] Date of Birth: 12-Jan-1927

## 2018-05-24 ENCOUNTER — Ambulatory Visit: Payer: Medicare Other

## 2018-05-24 DIAGNOSIS — R262 Difficulty in walking, not elsewhere classified: Secondary | ICD-10-CM

## 2018-05-24 DIAGNOSIS — M25551 Pain in right hip: Secondary | ICD-10-CM | POA: Diagnosis not present

## 2018-05-24 DIAGNOSIS — M6281 Muscle weakness (generalized): Secondary | ICD-10-CM

## 2018-05-24 NOTE — Therapy (Signed)
[place] [facility] [address], [place], [address] Phone: [phone]   Fax:  [phone]  Physical Therapy Treatment  Patient Details  Name: Diana Powell MRN: [id number] Date of Birth: Jan 12, 1927 Referring Provider (PT): Floyce Stakes   Encounter Date: 05/24/2018  PT End of Session - 05/24/18 1621    Visit Number  4    Number of Visits  13    Date for PT Re-Evaluation  06/26/18    Authorization Type  4 /10 G code    PT Start Time  1400    PT Stop Time  1445    PT Time Calculation (min)  45 min    Equipment Utilized During Treatment  Gait belt    Activity Tolerance  Patient limited by fatigue    Behavior During Therapy  [facility] for tasks assessed/performed       Past Medical History:  Diagnosis Date  . Atrial fibrillation (HCC)   . Chronic systolic heart failure (HCC)   . COPD (chronic obstructive pulmonary disease) (HCC)   . Osteoarthritis, multiple sites   . Osteoporosis, post-menopausal   . Presence of permanent cardiac pacemaker     Past Surgical History:  Procedure Laterality Date  . CARDIOVERSION  ~2009  . HIP FRACTURE SURGERY Bilateral [phone]   each hip fractured at separate times  . INSERT / REPLACE / REMOVE PACEMAKER    . TOTAL HIP ARTHROPLASTY Right 04/20/2018   Procedure: TOTAL HIP CONVERSION;  Surgeon: Kennedy Bucker, MD;  Location: [facility] ORS;  Service: Orthopedics;  Laterality: Right;    There were no vitals filed for this visit.  Subjective Assessment - 05/24/18 1619    Subjective  Patient reports she has been fatigued today and reports she continues to tire easily. Patient states no increase in pain and spasms since the previous session.   interpreted by son   Patient is accompained by:  Interpreter;Family member   Son and seperate interpreter   Pertinent History  CVA with perserved EF, self-reported 'Lung condition', R THA, DVT, wrist fx    Limitations  Lifting;Standing;Walking    Patient  Stated Goals  To walk around easier    Currently in Pain?  No/denies    Pain Onset  More than a month ago       TREATMENT Therapeutic Exercise: Seated Hip ER - 2 x 5 on the R LE  Seated Hip IR - 2 x 10  Hip adduction with 4# weight - 2 x 10 with glute squeeze Hip abduction with RTB - 2 x 10  LAQ in sitting - 2 x 10 3# Hip flexion in sitting - 2 x 10 3# Ambulation 138ft with FWW required CGA to perform from PT - focus on improving speed Resisted manual leg press in sitting - x 20    Patient demonstrates increased fatigue at the end of the session    PT Education - 05/24/18 1620    Education Details  form/technique with exercise    Person(s) Educated  Patient    Methods  Explanation;Demonstration    Comprehension  Verbalized understanding;Returned demonstration          PT Long Term Goals - 05/15/18 1631      PT LONG TERM GOAL #1   Title  Patient will be independent with HEP to continue benefits of therapy until after discharge.     Baseline  Dependent with form/technique    Time  6    Period  Weeks  Status  New    Target Date  06/26/18      PT LONG TERM GOAL #2   Title  Patient will be able to walk 150ft with least resistive AD without a sitting rest break to better ambulate at home without requiring rest breaks    Baseline  67ft    Time  6    Period  Weeks    Status  New    Target Date  06/26/18      PT LONG TERM GOAL #3   Title  Patient will be able to perform the TUG in under 30sec to indicate singificant improvement gait speed and dynamic balance with use of FWW    Baseline  68sec    Time  6    Period  Weeks    Status  New    Target Date  06/26/18      PT LONG TERM GOAL #4   Title  Patient will have a worst pain of 3/10 to indicate significant improvement in pain and spasms and be able to perform functional activities without increase in pain.     Baseline  10/10 worst pain    Time  6    Period  Weeks    Status  New    Target Date  06/26/18             Plan - 05/24/18 1622    Clinical Impression Statement  Performed greater amount exercises in sitting today secondary to increased fatigue at baseline today. Although patient demonstrates increased fatigue, she continues to make improvements with resistance levels with exercises. Patient will benefit from further skilled therapy to return to prior level of function.     Rehab Potential  Fair    Clinical Impairments Affecting Rehab Potential  (+) highly motivated, family support (-) age, coormorbities    PT Frequency  2x / week    PT Duration  6 weeks    PT Treatment/Interventions  Electrical Stimulation;Iontophoresis 4mg /ml Dexamethasone;Aquatic Therapy;Cryotherapy;Moist Heat;Therapeutic activities;Patient/family education;Manual techniques;Balance training;Stair training;Gait training;Neuromuscular re-education;Therapeutic exercise;Passive range of motion    PT Next Visit Plan  Progress strengthening and balance    PT Home Exercise Plan  See education section    Consulted and Agree with Plan of Care  Patient       Patient will benefit from skilled therapeutic intervention in order to improve the following deficits and impairments:  Abnormal gait, Pain, Decreased coordination, Decreased mobility, Increased muscle spasms, Postural dysfunction, Decreased endurance, Decreased range of motion, Decreased strength, Decreased balance, Difficulty walking  Visit Diagnosis: Pain in right hip  Difficulty in walking, not elsewhere classified  Muscle weakness (generalized)     Problem List Patient Active Problem List   Diagnosis Date Noted  . Acute DVT (deep venous thrombosis) (HCC) 04/29/2018  . 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 Atrial fibrillation (HCC)   . Chronic systolic heart failure (HCC)   . Osteoporosis, post-menopausal   . Osteoarthritis, multiple sites     [name], PT DPT 05/24/2018, 4:28 PM  [place] [facility] [address] [place], [address] Phone: [phone]   Fax:  [phone]  Name: Diana Powell MRN: [id number] Date of Birth: 11-17-1926

## 2018-05-30 ENCOUNTER — Ambulatory Visit: Payer: Medicare Other

## 2018-05-30 DIAGNOSIS — M6281 Muscle weakness (generalized): Secondary | ICD-10-CM

## 2018-05-30 DIAGNOSIS — M25551 Pain in right hip: Secondary | ICD-10-CM | POA: Diagnosis not present

## 2018-05-30 DIAGNOSIS — R262 Difficulty in walking, not elsewhere classified: Secondary | ICD-10-CM

## 2018-05-30 NOTE — Therapy (Signed)
[facility] [address], [place], [address] Phone: [phone]   Fax:  [phone]  Physical Therapy Treatment  Patient Details  Name: Diana Powell MRN: [id number] Date of Birth: Sep 04, 1926 Referring Provider (PT): Floyce Stakes   Encounter Date: 05/30/2018  PT End of Session - 05/30/18 1614    Visit Number  5    Number of Visits  13    Date for PT Re-Evaluation  06/26/18    Authorization Type  5 /10 G code    PT Start Time  1530    PT Stop Time  1615    PT Time Calculation (min)  45 min    Equipment Utilized During Treatment  Gait belt    Activity Tolerance  Patient limited by fatigue    Behavior During Therapy  [facility] for tasks assessed/performed       Past Medical History:  Diagnosis Date  . Atrial fibrillation (HCC)   . Chronic systolic heart failure (HCC)   . COPD (chronic obstructive pulmonary disease) (HCC)   . Osteoarthritis, multiple sites   . Osteoporosis, post-menopausal   . Presence of permanent cardiac pacemaker     Past Surgical History:  Procedure Laterality Date  . CARDIOVERSION  ~2009  . HIP FRACTURE SURGERY Bilateral [phone]   each hip fractured at separate times  . INSERT / REPLACE / REMOVE PACEMAKER    . TOTAL HIP ARTHROPLASTY Right 04/20/2018   Procedure: TOTAL HIP CONVERSION;  Surgeon: Kennedy Bucker, MD;  Location: [facility] ORS;  Service: Orthopedics;  Laterality: Right;    There were no vitals filed for this visit.  Subjective Assessment - 05/30/18 1557    Subjective  Patient reports she is ready to exercise and is ready to perform some exercises. Patient states she has been performing her exercises.    interpreted by son   Patient is accompained by:  Interpreter;Family member   Son and seperate interpreter   Pertinent History  CVA with perserved EF, self-reported 'Lung condition', R THA, DVT, wrist fx    Limitations  Lifting;Standing;Walking    Patient Stated Goals  To walk  around easier    Currently in Pain?  No/denies    Pain Onset  More than a month ago         TREATMENT Therapeutic Exercise: LAQ in sitting - 2 x 10 5# Seated Hip ER - 2 x 5 on the R LE  Hip flexion in sitting - 2 x 10 5# Hip abduction in standing - x10 Hip extension in standing - x 10  Marches in standing - x 10 B Ambulation 170ft with FWW required CGA to perform from PT - focus on improving speed Resisted manual leg press in sitting - x 10 Side stepping in standing with therapist support - 4 x 80ft   Patient demonstrates increased fatigue at the end of the session     PT Education - 05/30/18 1613    Education Details  form/technique with exercise    Person(s) Educated  Patient    Methods  Explanation;Demonstration    Comprehension  Verbalized understanding;Returned demonstration          PT Long Term Goals - 05/15/18 1631      PT LONG TERM GOAL #1   Title  Patient will be independent with HEP to continue benefits of therapy until after discharge.     Baseline  Dependent with form/technique    Time  6    Period  Weeks    Status  New    Target Date  06/26/18      PT LONG TERM GOAL #2   Title  Patient will be able to walk 12850ft with least resistive AD without a sitting rest break to better ambulate at home without requiring rest breaks    Baseline  8030ft    Time  6    Period  Weeks    Status  New    Target Date  06/26/18      PT LONG TERM GOAL #3   Title  Patient will be able to perform the TUG in under 30sec to indicate singificant improvement gait speed and dynamic balance with use of FWW    Baseline  68sec    Time  6    Period  Weeks    Status  New    Target Date  06/26/18      PT LONG TERM GOAL #4   Title  Patient will have a worst pain of 3/10 to indicate significant improvement in pain and spasms and be able to perform functional activities without increase in pain.     Baseline  10/10 worst pain    Time  6    Period  Weeks    Status  New    Target  Date  06/26/18            Plan - 05/30/18 1616    Clinical Impression Statement  Performed gretaer standing exercises as patient conitnues to improve towards balancing and strengthening goals. Patient demonstrates increased improvement with hip strengthening, however conitnues to have weakness in standing and early onset of fatigue. Patient will benefit from further skilled therapy to return to prior level of function.     Rehab Potential  Fair    Clinical Impairments Affecting Rehab Potential  (+) highly motivated, family support (-) age, coormorbities    PT Frequency  2x / week    PT Duration  6 weeks    PT Treatment/Interventions  Electrical Stimulation;Iontophoresis 4mg /ml Dexamethasone;Aquatic Therapy;Cryotherapy;Moist Heat;Therapeutic activities;Patient/family education;Manual techniques;Balance training;Stair training;Gait training;Neuromuscular re-education;Therapeutic exercise;Passive range of motion    PT Next Visit Plan  Progress strengthening and balance    PT Home Exercise Plan  See education section    Consulted and Agree with Plan of Care  Patient       Patient will benefit from skilled therapeutic intervention in order to improve the following deficits and impairments:  Abnormal gait, Pain, Decreased coordination, Decreased mobility, Increased muscle spasms, Postural dysfunction, Decreased endurance, Decreased range of motion, Decreased strength, Decreased balance, Difficulty walking  Visit Diagnosis: Pain in right hip  Difficulty in walking, not elsewhere classified  Muscle weakness (generalized)     Problem List Patient Active Problem List   Diagnosis Date Noted  . Acute DVT (deep venous thrombosis) (HCC) 04/29/2018  . 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 Atrial fibrillation (HCC)   . Chronic systolic heart failure (HCC)   . Osteoporosis, post-menopausal    . Osteoarthritis, multiple sites     Myrene GalasWesley Kevin Space, PT DPT 05/30/2018, 4:18 PM  [facility] [address] [place], [place], [address] Phone: [phone]   Fax:  [phone]  Name: Diana Powell MRN: [id number] Date of Birth: 11/23/1926

## 2018-06-07 ENCOUNTER — Ambulatory Visit: Payer: Medicare Other

## 2018-06-07 DIAGNOSIS — M25551 Pain in right hip: Secondary | ICD-10-CM

## 2018-06-07 DIAGNOSIS — M6281 Muscle weakness (generalized): Secondary | ICD-10-CM

## 2018-06-07 DIAGNOSIS — R262 Difficulty in walking, not elsewhere classified: Secondary | ICD-10-CM

## 2018-06-07 NOTE — Therapy (Signed)
[facility] [address] [place], [address] Phone: [phone]   Fax:  [phone]  Physical Therapy Treatment  Patient Details  Name: Diana Powell MRN: [id number] Date of Birth: February 02, 1927 Referring Provider (PT): Floyce Stakes   Encounter Date: 06/07/2018  PT End of Session - 06/07/18 1859    Visit Number  6    Number of Visits  13    Date for PT Re-Evaluation  06/26/18    Authorization Type  6 /10 G code    PT Start Time  1650    PT Stop Time  1730    PT Time Calculation (min)  40 min    Equipment Utilized During Treatment  Gait belt    Activity Tolerance  Patient limited by fatigue    Behavior During Therapy  [facility] for tasks assessed/performed       Past Medical History:  Diagnosis Date  . Atrial fibrillation (HCC)   . Chronic systolic heart failure (HCC)   . COPD (chronic obstructive pulmonary disease) (HCC)   . Osteoarthritis, multiple sites   . Osteoporosis, post-menopausal   . Presence of permanent cardiac pacemaker     Past Surgical History:  Procedure Laterality Date  . CARDIOVERSION  ~2009  . HIP FRACTURE SURGERY Bilateral [phone]   each hip fractured at separate times  . INSERT / REPLACE / REMOVE PACEMAKER    . TOTAL HIP ARTHROPLASTY Right 04/20/2018   Procedure: TOTAL HIP CONVERSION;  Surgeon: Kennedy Bucker, MD;  Location: [facility] ORS;  Service: Orthopedics;  Laterality: Right;    There were no vitals filed for this visit.  Subjective Assessment - 06/07/18 1622    Subjective  Patient states she has been having some pain along the R side of her back radiaiting towards the anterior aspect of her rib. Patient also states she has been ambulating without an AD but uses furniture to maintain balance.    interpreted by son   Patient is accompained by:  Interpreter;Family member   Son and seperate interpreter   Pertinent History  CVA with perserved EF, self-reported 'Lung condition', R THA,  DVT, wrist fx    Limitations  Lifting;Standing;Walking    Patient Stated Goals  To walk around easier    Currently in Pain?  No/denies    Pain Onset  More than a month ago       TREATMENT Therapeutic Exercises: Stepping over obstacles - 53ft with 3 obstacles performed x 3 Single leg stance with intermittent UE support - 10sec x 8 Walking with heel strike - x30 Side stepping across with UE support from therapist - x 20  Walking up with a cane single point - x 30  Ambulation for endurance - 56ft  Patient demonstrates no increase in pain at the end of the session   PT Education - 06/07/18 1859    Education Details  form/technique with exercise    Person(s) Educated  Patient    Methods  Explanation;Demonstration    Comprehension  Verbalized understanding;Returned demonstration          PT Long Term Goals - 05/15/18 1631      PT LONG TERM GOAL #1   Title  Patient will be independent with HEP to continue benefits of therapy until after discharge.     Baseline  Dependent with form/technique    Time  6    Period  Weeks    Status  New    Target Date  06/26/18  PT LONG TERM GOAL #2   Title  Patient will be able to walk 13250ft with least resistive AD without a sitting rest break to better ambulate at home without requiring rest breaks    Baseline  7530ft    Time  6    Period  Weeks    Status  New    Target Date  06/26/18      PT LONG TERM GOAL #3   Title  Patient will be able to perform the TUG in under 30sec to indicate singificant improvement gait speed and dynamic balance with use of FWW    Baseline  68sec    Time  6    Period  Weeks    Status  New    Target Date  06/26/18      PT LONG TERM GOAL #4   Title  Patient will have a worst pain of 3/10 to indicate significant improvement in pain and spasms and be able to perform functional activities without increase in pain.     Baseline  10/10 worst pain    Time  6    Period  Weeks    Status  New    Target Date   06/26/18            Plan - 06/07/18 1900    Clinical Impression Statement  Patient demonstrates good balance with using SPC however demonstrates poor reciprocal gait pattern. Patient demosntrates improvement with ambulation after cueing and patient will benefit from further skilled therapy to return to prior level of function.     Rehab Potential  Fair    Clinical Impairments Affecting Rehab Potential  (+) highly motivated, family support (-) age, coormorbities    PT Frequency  2x / week    PT Duration  6 weeks    PT Treatment/Interventions  Electrical Stimulation;Iontophoresis 4mg /ml Dexamethasone;Aquatic Therapy;Cryotherapy;Moist Heat;Therapeutic activities;Patient/family education;Manual techniques;Balance training;Stair training;Gait training;Neuromuscular re-education;Therapeutic exercise;Passive range of motion    PT Next Visit Plan  Progress strengthening and balance    PT Home Exercise Plan  See education section    Consulted and Agree with Plan of Care  Patient       Patient will benefit from skilled therapeutic intervention in order to improve the following deficits and impairments:  Abnormal gait, Pain, Decreased coordination, Decreased mobility, Increased muscle spasms, Postural dysfunction, Decreased endurance, Decreased range of motion, Decreased strength, Decreased balance, Difficulty walking  Visit Diagnosis: Pain in right hip  Difficulty in walking, not elsewhere classified  Muscle weakness (generalized)     Problem List Patient Active Problem List   Diagnosis Date Noted  . Acute DVT (deep venous thrombosis) (HCC) 04/29/2018  . 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 Atrial fibrillation (HCC)   . Chronic systolic heart failure (HCC)   . Osteoporosis, post-menopausal   . Osteoarthritis, multiple sites     Myrene GalasWesley Tarik Teixeira, PT DPT 06/07/2018, 7:08 PM  [facility] [address] [place], [place], [address] Phone: [phone]   Fax:  [phone]  Name: Genia HotterGalina Latner MRN: [id number] Date of Birth: Jan 01, 1927

## 2018-06-13 ENCOUNTER — Ambulatory Visit: Payer: Medicare Other

## 2018-06-19 ENCOUNTER — Ambulatory Visit: Payer: Medicare Other | Attending: Orthopedic Surgery | Admitting: Physical Therapy

## 2018-06-19 DIAGNOSIS — M25551 Pain in right hip: Secondary | ICD-10-CM | POA: Insufficient documentation

## 2018-06-19 DIAGNOSIS — R262 Difficulty in walking, not elsewhere classified: Secondary | ICD-10-CM

## 2018-06-19 DIAGNOSIS — M6281 Muscle weakness (generalized): Secondary | ICD-10-CM | POA: Diagnosis present

## 2018-06-19 NOTE — Therapy (Signed)
[facility]IC[facility] [address], [place], [address] Phone: [phone]   Fax:  [phone]  Physical Therapy Treatment  Patient Details  Name: Diana Powell MRN: [id number] Date of Birth: 1927/01/04 Referring Provider (PT): Floyce Stakes   Encounter Date: 06/19/2018  PT End of Session - 06/19/18 1615    Visit Number  7    Number of Visits  13    Date for PT Re-Evaluation  06/26/18    Authorization Type  7 /10 G code    PT Start Time  1435    PT Stop Time  1520    PT Time Calculation (min)  45 min    Equipment Utilized During Treatment  Gait belt    Activity Tolerance  Patient limited by fatigue;Patient tolerated treatment well    Behavior During Therapy  [facility] for tasks assessed/performed       Past Medical History:  Diagnosis Date  . Atrial fibrillation (HCC)   . Chronic systolic heart failure (HCC)   . COPD (chronic obstructive pulmonary disease) (HCC)   . Osteoarthritis, multiple sites   . Osteoporosis, post-menopausal   . Presence of permanent cardiac pacemaker     Past Surgical History:  Procedure Laterality Date  . CARDIOVERSION  ~2009  . HIP FRACTURE SURGERY Bilateral [phone]   each hip fractured at separate times  . INSERT / REPLACE / REMOVE PACEMAKER    . TOTAL HIP ARTHROPLASTY Right 04/20/2018   Procedure: TOTAL HIP CONVERSION;  Surgeon: Kennedy Bucker, MD;  Location: [facility] ORS;  Service: Orthopedics;  Laterality: Right;    There were no vitals filed for this visit.  Subjective Assessment - 06/19/18 1436    Subjective  Patient reports that nothing changed since last visit. Patient has been to the MD and son reports things feel worse. Patient received results and there is nothing apparent on MD tests. Patient states that she was unable to do exercises because of increased pain.   interpreted by interpreter and hx supplemented by son   Patient is accompained by:  Interpreter;Family member   Son and  seperate interpreter   Pertinent History  CVA with perserved EF, self-reported 'Lung condition', R THA, DVT, wrist fx    Limitations  Lifting;Standing;Walking    Patient Stated Goals  To walk around easier    Currently in Pain?  No/denies    Pain Score  0-No pain    Pain Location  Back    Pain Type  Chronic pain    Pain Onset  More than a month ago    Pain Frequency  Intermittent       TREATMENT Therapeutic Exercise: B Seated marches 2x15, #2 AW B LAQ 2x15, #2 AW B Seated clamshells, GTB, 2x10 B Seated hamstring curl, GTB, 1x15 Seated trunk flexion/extension w/ 1.2 kg ball x10 Seated overhead reach w/ 1.2 kg ball x10  SpO2: 87%; recovered to 92% at end of session   PT Education - 06/19/18 1614    Education Details  exercise technique, breathing cues during effort     Person(s) Educated  Patient    Methods  Explanation;Demonstration;Verbal cues    Comprehension  Verbalized understanding;Need further instruction;Returned demonstration          PT Long Term Goals - 05/15/18 1631      PT LONG TERM GOAL #1   Title  Patient will be independent with HEP to continue benefits of therapy until after discharge.     Baseline  Dependent with form/technique    Time  6    Period  Weeks    Status  New    Target Date  06/26/18      PT LONG TERM GOAL #2   Title  Patient will be able to walk 18650ft with least resistive AD without a sitting rest break to better ambulate at home without requiring rest breaks    Baseline  4430ft    Time  6    Period  Weeks    Status  New    Target Date  06/26/18      PT LONG TERM GOAL #3   Title  Patient will be able to perform the TUG in under 30sec to indicate singificant improvement gait speed and dynamic balance with use of FWW    Baseline  68sec    Time  6    Period  Weeks    Status  New    Target Date  06/26/18      PT LONG TERM GOAL #4   Title  Patient will have a worst pain of 3/10 to indicate significant improvement in pain and spasms  and be able to perform functional activities without increase in pain.     Baseline  10/10 worst pain    Time  6    Period  Weeks    Status  New    Target Date  06/26/18            Plan - 06/19/18 1616    Clinical Impression Statement  Patient performed seated strengthening exercises today 2/2 to increased fatigue after illness last week. Patient requires verbal cues to maintain continuous breathing during effort. Patient kept SpO2 above 85% during exercises, and required 3 minutes of sitting rest to bring SpO2 above 90%. Patient's son was present throughout the session and expressed discontent with the decision to perform seated exercises and felt as though she required maximum effort to receive benefit. Patient and son were educated on the benefits of performing seated exercises to improve trunk and LE strengthening in preparation for gait when the patient is too fatigued to perform gait activities. Patient will benefit from continued skilled therapeutic intervention to maximize function, improve gait, and address deficits in strength, endurance, and activity tolerance.     Rehab Potential  Fair    Clinical Impairments Affecting Rehab Potential  (+) highly motivated, family support (-) age, coormorbities    PT Frequency  2x / week    PT Duration  6 weeks    PT Treatment/Interventions  Electrical Stimulation;Iontophoresis 4mg /ml Dexamethasone;Aquatic Therapy;Cryotherapy;Moist Heat;Therapeutic activities;Patient/family education;Manual techniques;Balance training;Stair training;Gait training;Neuromuscular re-education;Therapeutic exercise;Passive range of motion    PT Next Visit Plan  Progress strengthening and balance    PT Home Exercise Plan  See education section    Consulted and Agree with Plan of Care  Patient       Patient will benefit from skilled therapeutic intervention in order to improve the following deficits and impairments:  Abnormal gait, Pain, Decreased coordination,  Decreased mobility, Increased muscle spasms, Postural dysfunction, Decreased endurance, Decreased range of motion, Decreased strength, Decreased balance, Difficulty walking  Visit Diagnosis: Pain in right hip  Difficulty in walking, not elsewhere classified  Muscle weakness (generalized)     Problem List Patient Active Problem List   Diagnosis Date Noted  . Acute DVT (deep venous thrombosis) (HCC) 04/29/2018  . 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 Dementia due to medical condition without behavioral disturbance (HCC)  05/04/2013  . Atrial fibrillation (HCC)   . Chronic systolic heart failure (HCC)   . Osteoporosis, post-menopausal   . Osteoarthritis, multiple sites    Sheria Lang PT, [place] #[id number] 06/19/2018, 4:32 PM  [facility] [address], [place], [address] Phone: [phone]   Fax:  [phone]  Name: Diana Powell MRN: [id number] Date of Birth: 27-Jun-1927

## 2018-06-28 ENCOUNTER — Ambulatory Visit: Payer: Medicare Other

## 2018-06-28 DIAGNOSIS — M25551 Pain in right hip: Secondary | ICD-10-CM | POA: Diagnosis not present

## 2018-06-28 DIAGNOSIS — M6281 Muscle weakness (generalized): Secondary | ICD-10-CM

## 2018-06-28 DIAGNOSIS — R262 Difficulty in walking, not elsewhere classified: Secondary | ICD-10-CM

## 2018-06-28 NOTE — Therapy (Signed)
[facility] [address] [place], [place], [address] Phone: [phone]   Fax:  [phone]  Physical Therapy Treatment  Patient Details  Name: Genia HotterGalina Deskins MRN: [id number] Date of Birth: May 10, 1927 Referring Provider (PT): Floyce StakesGaines   Encounter Date: 06/28/2018  PT End of Session - 06/28/18 1620    Visit Number  8    Number of Visits  13    Date for PT Re-Evaluation  08/09/18    Authorization Type  8 /10 G code    PT Start Time  1430    PT Stop Time  1515    PT Time Calculation (min)  45 min    Equipment Utilized During Treatment  Gait belt    Activity Tolerance  Patient limited by fatigue;Patient tolerated treatment well    Behavior During Therapy  [facility] for tasks assessed/performed       Past Medical History:  Diagnosis Date  . Atrial fibrillation (HCC)   . Chronic systolic heart failure (HCC)   . COPD (chronic obstructive pulmonary disease) (HCC)   . Osteoarthritis, multiple sites   . Osteoporosis, post-menopausal   . Presence of permanent cardiac pacemaker     Past Surgical History:  Procedure Laterality Date  . CARDIOVERSION  ~2009  . HIP FRACTURE SURGERY Bilateral [phone],1996   each hip fractured at separate times  . INSERT / REPLACE / REMOVE PACEMAKER    . TOTAL HIP ARTHROPLASTY Right 04/20/2018   Procedure: TOTAL HIP CONVERSION;  Surgeon: Kennedy BuckerMenz, Michael, MD;  Location: [facility] ORS;  Service: Orthopedics;  Laterality: Right;    There were no vitals filed for this visit.  Subjective Assessment - 06/28/18 1509    Subjective  Patient reports she has been having upper quarant pain and is under medical doctor supervision for her pain. Patient reports she is improving overall in her hip.    interpreted by interpreter and hx supplemented by son   Patient is accompained by:  Interpreter;Family member   Son and seperate interpreter   Pertinent History  CVA with perserved EF, self-reported 'Lung condition', R  THA, DVT, wrist fx    Limitations  Lifting;Standing;Walking    Patient Stated Goals  To walk around easier    Currently in Pain?  Yes    Pain Score  2     Pain Location  Back    Pain Orientation  Right    Pain Descriptors / Indicators  Aching    Pain Onset  More than a month ago    Pain Frequency  Intermittent        TREATMENT Therapeutic Exercise Timed up and go performed x 5 as an exercise and times for base measure Ambulation with focus on reciprocal gait pattern with SPC -- x 20  Walking over hurdles (6 hurdles) -- performed x 4 with therapist CGA Walking around cones for balance -- x 3 with 5 cones Walking with focus on reciprocal gait pattern -- 8340ft x 4  Patient demonstrates increased fatigue at the end of the session      PT Education - 06/28/18 1519    Education Details  form/technique; [facility]    Person(s) Educated  Patient    Methods  Explanation;Demonstration    Comprehension  Verbalized understanding;Returned demonstration          PT Long Term Goals - 06/28/18 1621      PT LONG TERM GOAL #1   Title  Patient will be independent  with HEP to continue benefits of therapy until after discharge.     Baseline  Dependent with form/technique; independent with HEP    Time  6    Period  Weeks    Status  Achieved      PT LONG TERM GOAL #2   Title  Patient will be able to walk 121ft with least resistive AD without a sitting rest break to better ambulate at home without requiring rest breaks    Baseline  75ft; 06/28/2018: >423ft    Time  6    Period  Weeks    Status  Achieved      PT LONG TERM GOAL #3   Title  Patient will be able to perform the TUG in under 30sec to indicate singificant improvement gait speed and dynamic balance with use of FWW    Baseline  68sec; 06/28/2018: 25.5sec    Time  6    Period  Weeks    Status  Achieved      PT LONG TERM GOAL #4   Title  Patient will have a worst pain of 3/10 to indicate significant improvement  in pain and spasms and be able to perform functional activities without increase in pain.     Baseline  10/10 worst pain; unable to rate secondary to stomach/back pain    Time  6    Period  Weeks    Status  On-going      PT LONG TERM GOAL #5   Title  Patient will improve gait speed to > .13m/s to indicate significant improvement with ability to community ambulate with decreased fall risk    Baseline  .44 m/s    Time  6    Period  Weeks    Status  New      Additional Long Term Goals   Additional Long Term Goals  Yes      PT LONG TERM GOAL #6   Title  Patient will improve TUG to under 12 sec to improve fall risk and improvement with quality ambulation    Baseline  25.5sec    Time  6    Period  Weeks    Status  New            Plan - 06/28/18 1625    Clinical Impression Statement  Patient demonstrates improvement towards acheiving long term goals with significant improvement in walking quality/duration and TUG indicating significant improvement in fall risk and dynamic/static balance. Although patient is improving, she continues to have slow gait speed, poor reciprocal gait and static balance. Patient will benefit from furhter skilled therapy to return to prior level of function.     Rehab Potential  Fair    Clinical Impairments Affecting Rehab Potential  (+) highly motivated, family support (-) age, coormorbities    PT Frequency  2x / week    PT Duration  6 weeks    PT Treatment/Interventions  Electrical Stimulation;Iontophoresis 4mg /ml Dexamethasone;Aquatic Therapy;Cryotherapy;Moist Heat;Therapeutic activities;Patient/family education;Manual techniques;Balance training;Stair training;Gait training;Neuromuscular re-education;Therapeutic exercise;Passive range of motion    PT Next Visit Plan  Progress strengthening and balance    PT Home Exercise Plan  See education section    Consulted and Agree with Plan of Care  Patient       Patient will benefit from skilled therapeutic  intervention in order to improve the following deficits and impairments:  Abnormal gait, Pain, Decreased coordination, Decreased mobility, Increased muscle spasms, Postural dysfunction, Decreased endurance, Decreased range of motion, Decreased strength, Decreased  balance, Difficulty walking  Visit Diagnosis: Pain in right hip  Difficulty in walking, not elsewhere classified  Muscle weakness (generalized)     Problem List Patient Active Problem List   Diagnosis Date Noted  . Acute DVT (deep venous thrombosis) (HCC) 04/29/2018  . 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 Atrial fibrillation (HCC)   . Chronic systolic heart failure (HCC)   . Osteoporosis, post-menopausal   . Osteoarthritis, multiple sites     [name], PT DPT 06/28/2018, 4:28 PM  [facility] [address], [place], [address] Phone: [phone]   Fax:  [phone]  Name: Tallulah Hosman MRN: [id number] Date of Birth: [birth year]

## 2018-07-04 ENCOUNTER — Ambulatory Visit: Payer: Medicare Other

## 2018-07-04 DIAGNOSIS — R262 Difficulty in walking, not elsewhere classified: Secondary | ICD-10-CM

## 2018-07-04 DIAGNOSIS — M25551 Pain in right hip: Secondary | ICD-10-CM

## 2018-07-04 DIAGNOSIS — M6281 Muscle weakness (generalized): Secondary | ICD-10-CM

## 2018-07-04 NOTE — Therapy (Signed)
[facility] [address], [place], [address] Phone: [phone]   Fax:  [phone]  Physical Therapy Treatment  Patient Details  Name: Diana Powell MRN: [id number] Date of Birth: [birth year] Referring Provider (PT): Floyce Stakes   Encounter Date: 07/04/2018  PT End of Session - 07/04/18 1542    Visit Number  9    Number of Visits  13    Date for PT Re-Evaluation  08/09/18    Authorization Type  1 /10 G code    PT Start Time  1500    PT Stop Time  1545    PT Time Calculation (min)  45 min    Equipment Utilized During Treatment  Gait belt    Activity Tolerance  Patient limited by fatigue;Patient tolerated treatment well    Behavior During Therapy  [facility] for tasks assessed/performed       Past Medical History:  Diagnosis Date  . Atrial fibrillation (HCC)   . Chronic systolic heart failure (HCC)   . COPD (chronic obstructive pulmonary disease) (HCC)   . Osteoarthritis, multiple sites   . Osteoporosis, post-menopausal   . Presence of permanent cardiac pacemaker     Past Surgical History:  Procedure Laterality Date  . CARDIOVERSION  ~2009  . HIP FRACTURE SURGERY Bilateral [phone]   each hip fractured at separate times  . INSERT / REPLACE / REMOVE PACEMAKER    . TOTAL HIP ARTHROPLASTY Right 04/20/2018   Procedure: TOTAL HIP CONVERSION;  Surgeon: Kennedy Bucker, MD;  Location: [facility] ORS;  Service: Orthopedics;  Laterality: Right;    There were no vitals filed for this visit.  Subjective Assessment - 07/04/18 1518    Subjective  Patient reports she continues to have difficulty with seeing but is overall improving in function.    interpreted by interpreter and hx supplemented by son   Patient is accompained by:  Interpreter;Family member   Son and seperate interpreter   Pertinent History  CVA with perserved EF, self-reported 'Lung condition', R THA, DVT, wrist fx    Limitations  Lifting;Standing;Walking    Patient Stated Goals  To walk around easier    Currently in Pain?  No/denies    Pain Onset  More than a month ago       TREATMENT Therapeutic Exercise  Stepping over hurdles - x3 down and back (6 hurdles) Steps - x 8 with unilateral  Weaving in and out of hurdles - 55ft x 2 pertubations  Ambulation with reciprocal gait pattern - x 51ft  Pertubations with patient in standing - x 10  Rotational cone grabs in stands - x 10 B directions Cone pick ups - x 30 with therapist CGA  Patient demonstrates increased fatigue at the end of the session  PT Education - 07/04/18 1519    Education Details  form/technique with exercise    Person(s) Educated  Patient    Methods  Explanation;Demonstration    Comprehension  Verbalized understanding;Returned demonstration          PT Long Term Goals - 06/28/18 1621      PT LONG TERM GOAL #1   Title  Patient will be independent with HEP to continue benefits of therapy until after discharge.     Baseline  Dependent with form/technique; independent with HEP    Time  6    Period  Weeks    Status  Achieved      PT LONG TERM GOAL #2   Title  Patient will be able to walk 15150ft with least resistive AD without a sitting rest break to better ambulate at home without requiring rest breaks    Baseline  3230ft; 06/28/2018: >42350ft    Time  6    Period  Weeks    Status  Achieved      PT LONG TERM GOAL #3   Title  Patient will be able to perform the TUG in under 30sec to indicate singificant improvement gait speed and dynamic balance with use of FWW    Baseline  68sec; 06/28/2018: 25.5sec    Time  6    Period  Weeks    Status  Achieved      PT LONG TERM GOAL #4   Title  Patient will have a worst pain of 3/10 to indicate significant improvement in pain and spasms and be able to perform functional activities without increase in pain.     Baseline  10/10 worst pain; unable to rate secondary to stomach/back pain    Time  6    Period  Weeks    Status   On-going      PT LONG TERM GOAL #5   Title  Patient will improve gait speed to > .7785m/s to indicate significant improvement with ability to community ambulate with decreased fall risk    Baseline  .44 m/s    Time  6    Period  Weeks    Status  New      Additional Long Term Goals   Additional Long Term Goals  Yes      PT LONG TERM GOAL #6   Title  Patient will improve TUG to under 12 sec to improve fall risk and improvement with quality ambulation    Baseline  25.5sec    Time  6    Period  Weeks    Status  New            Plan - 07/04/18 1542    Clinical Impression Statement  Patient demonstrates improvement with ability to perform ambulation with a greater reciprocal gait pattern compared to previous sessions. Although patient is improving, she continues to have balance difficulties with narrow BOS. Patient will benefit from further skilled therapy to return to prior level of function.     Rehab Potential  Fair    Clinical Impairments Affecting Rehab Potential  (+) highly motivated, family support (-) age, coormorbities    PT Frequency  2x / week    PT Duration  6 weeks    PT Treatment/Interventions  Electrical Stimulation;Iontophoresis 4mg /ml Dexamethasone;Aquatic Therapy;Cryotherapy;Moist Heat;Therapeutic activities;Patient/family education;Manual techniques;Balance training;Stair training;Gait training;Neuromuscular re-education;Therapeutic exercise;Passive range of motion    PT Next Visit Plan  Progress strengthening and balance    PT Home Exercise Plan  See education section    Consulted and Agree with Plan of Care  Patient       Patient will benefit from skilled therapeutic intervention in order to improve the following deficits and impairments:  Abnormal gait, Pain, Decreased coordination, Decreased mobility, Increased muscle spasms, Postural dysfunction, Decreased endurance, Decreased range of motion, Decreased strength, Decreased balance, Difficulty walking  Visit  Diagnosis: Pain in right hip  Difficulty in walking, not elsewhere classified  Muscle weakness (generalized)     Problem List Patient Active Problem List   Diagnosis Date Noted  . Acute DVT (deep venous thrombosis) (HCC) 04/29/2018  . 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 Dementia due to medical condition without behavioral disturbance (HCC)  05/04/2013  . Atrial fibrillation (HCC)   . Chronic systolic heart failure (HCC)   . Osteoporosis, post-menopausal   . Osteoarthritis, multiple sites     [name], PT DPT 07/04/2018, 3:48 PM  [facility] PHYS[facility] AND SPORTS MEDICINE [address] [place], [address] Phone: [phone]   Fax:  [phone]  Name: Diana Powell MRN: [id number] Date of Birth: 21-May-1927

## 2018-07-17 ENCOUNTER — Ambulatory Visit: Payer: Medicare Other

## 2018-07-17 DIAGNOSIS — M25551 Pain in right hip: Secondary | ICD-10-CM

## 2018-07-17 DIAGNOSIS — M6281 Muscle weakness (generalized): Secondary | ICD-10-CM

## 2018-07-17 DIAGNOSIS — R262 Difficulty in walking, not elsewhere classified: Secondary | ICD-10-CM

## 2018-07-17 NOTE — Therapy (Signed)
[facility] [address] [place], [place], [address] Phone: [phone]   Fax:  [phone]  Physical Therapy Treatment  Patient Details  Name: Diana Powell MRN: [id number] Date of Birth: 1927/07/18 Referring Provider (PT): Floyce StakesGaines   Encounter Date: 07/17/2018  PT End of Session - 07/17/18 1700    Visit Number  10    Number of Visits  13    Date for PT Re-Evaluation  08/09/18    Authorization Type  2 /10 G code    PT Start Time  1600    PT Stop Time  1645    PT Time Calculation (min)  45 min    Equipment Utilized During Treatment  Gait belt    Activity Tolerance  Patient limited by fatigue;Patient tolerated treatment well    Behavior During Therapy  [facility] for tasks assessed/performed       Past Medical History:  Diagnosis Date  . Atrial fibrillation (HCC)   . Chronic systolic heart failure (HCC)   . COPD (chronic obstructive pulmonary disease) (HCC)   . Osteoarthritis, multiple sites   . Osteoporosis, post-menopausal   . Presence of permanent cardiac pacemaker     Past Surgical History:  Procedure Laterality Date  . CARDIOVERSION  ~2009  . HIP FRACTURE SURGERY Bilateral [phone],1996   each hip fractured at separate times  . INSERT / REPLACE / REMOVE PACEMAKER    . TOTAL HIP ARTHROPLASTY Right 04/20/2018   Procedure: TOTAL HIP CONVERSION;  Surgeon: Kennedy BuckerMenz, Michael, MD;  Location: [facility] ORS;  Service: Orthopedics;  Laterality: Right;    There were no vitals filed for this visit.  Subjective Assessment - 07/17/18 1626    Subjective  Patient reports she has been feeling more tired and fatigued over the past 1.5 weeks. Patient states she had an intermittent pain along her abdomen which went away but resulted with an increased headache and increased faitgue overall.    interpreted by interpreter and hx supplemented by son   Patient is accompained by:  Interpreter;Family member   Son and seperate interpreter   Pertinent History  CVA with perserved EF, self-reported 'Lung condition', R THA, DVT, wrist fx    Limitations  Lifting;Standing;Walking    Patient Stated Goals  To walk around easier    Currently in Pain?  No/denies    Pain Onset  More than a month ago       TREATMENT Therapeutic Exercise  Pertubations with patient in standing - 2 x 20 with EC  Walking stepping on airex - walking through hurdles - x 2 (~930ft) Side stepping across airex beam down and back - x 3 with intermittent UE support  Steps - x 5 with unilateral UE support Cone pick ups - x 15 with therapist CGA on airex pad  Side stepping up and over airex pad -x 10    Patient demonstrates increased fatigue at the end of the session   PT Education - 07/17/18 1702    Education Details  form/technique with exercise    Person(s) Educated  Patient    Methods  Explanation;Demonstration    Comprehension  Verbalized understanding;Returned demonstration          PT Long Term Goals - 06/28/18 1621      PT LONG TERM GOAL #1   Title  Patient will be independent with HEP to continue benefits of therapy until after discharge.     Baseline  Dependent with form/technique; independent with HEP  Time  6    Period  Weeks    Status  Achieved      PT LONG TERM GOAL #2   Title  Patient will be able to walk 170ft with least resistive AD without a sitting rest break to better ambulate at home without requiring rest breaks    Baseline  21ft; 06/28/2018: >417ft    Time  6    Period  Weeks    Status  Achieved      PT LONG TERM GOAL #3   Title  Patient will be able to perform the TUG in under 30sec to indicate singificant improvement gait speed and dynamic balance with use of FWW    Baseline  68sec; 06/28/2018: 25.5sec    Time  6    Period  Weeks    Status  Achieved      PT LONG TERM GOAL #4   Title  Patient will have a worst pain of 3/10 to indicate significant improvement in pain and spasms and be able to perform functional  activities without increase in pain.     Baseline  10/10 worst pain; unable to rate secondary to stomach/back pain    Time  6    Period  Weeks    Status  On-going      PT LONG TERM GOAL #5   Title  Patient will improve gait speed to > .92m/s to indicate significant improvement with ability to community ambulate with decreased fall risk    Baseline  .44 m/s    Time  6    Period  Weeks    Status  New      Additional Long Term Goals   Additional Long Term Goals  Yes      PT LONG TERM GOAL #6   Title  Patient will improve TUG to under 12 sec to improve fall risk and improvement with quality ambulation    Baseline  25.5sec    Time  6    Period  Weeks    Status  New            Plan - 07/17/18 1650    Clinical Impression Statement  Patient demonstrates greater amount of fatigue with exercise and requires greater sitting rest breaks compared to previous sessions, however was able to progress exercises with ability to perform exercises with her eyes closed. Patient continues to demonstrate improvement with walking with ability to amb without SPC with CGA. Patient will benefit from further skilled therapy to return to prior level of function.     Rehab Potential  Fair    Clinical Impairments Affecting Rehab Potential  (+) highly motivated, family support (-) age, coormorbities    PT Frequency  2x / week    PT Duration  6 weeks    PT Treatment/Interventions  Electrical Stimulation;Iontophoresis 4mg /ml Dexamethasone;Aquatic Therapy;Cryotherapy;Moist Heat;Therapeutic activities;Patient/family education;Manual techniques;Balance training;Stair training;Gait training;Neuromuscular re-education;Therapeutic exercise;Passive range of motion    PT Next Visit Plan  Progress strengthening and balance    PT Home Exercise Plan  See education section    Consulted and Agree with Plan of Care  Patient       Patient will benefit from skilled therapeutic intervention in order to improve the following  deficits and impairments:  Abnormal gait, Pain, Decreased coordination, Decreased mobility, Increased muscle spasms, Postural dysfunction, Decreased endurance, Decreased range of motion, Decreased strength, Decreased balance, Difficulty walking  Visit Diagnosis: Pain in right hip  Difficulty in walking, not elsewhere classified  Muscle  weakness (generalized)     Problem List Patient Active Problem List   Diagnosis Date Noted  . Acute DVT (deep venous thrombosis) (HCC) 04/29/2018  . 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 Atrial fibrillation (HCC)   . Chronic systolic heart failure (HCC)   . Osteoporosis, post-menopausal   . Osteoarthritis, multiple sites     Myrene GalasWesley Levell Tavano, PT DPT 07/17/2018, 5:02 PM  [facility] [address] [place], [place], [address] Phone: [phone]   Fax:  [phone]  Name: Diana Powell MRN: [id number] Date of Birth: [birth year]

## 2018-08-03 ENCOUNTER — Ambulatory Visit: Payer: Medicare Other | Attending: Orthopedic Surgery

## 2018-08-03 DIAGNOSIS — M25551 Pain in right hip: Secondary | ICD-10-CM | POA: Diagnosis not present

## 2018-08-03 DIAGNOSIS — M6281 Muscle weakness (generalized): Secondary | ICD-10-CM | POA: Insufficient documentation

## 2018-08-03 DIAGNOSIS — R262 Difficulty in walking, not elsewhere classified: Secondary | ICD-10-CM | POA: Diagnosis present

## 2018-08-03 NOTE — Therapy (Signed)
[facility] PHYSICAL AND SPORTS MEDICINE [address] [place], [place], [address] Phone: [phone]   Fax:  [phone]  Physical Therapy Treatment  Patient Details  Name: Genia HotterGalina Wion MRN: [id number] Date of Birth: February 10, 1927 Referring Provider (PT): Floyce StakesGaines   Encounter Date: 08/03/2018  PT End of Session - 08/03/18 1418    Visit Number  11    Number of Visits  21    Date for PT Re-Evaluation  08/09/18    Authorization Type  3 /10 G code    PT Start Time  1345    PT Stop Time  1430    PT Time Calculation (min)  45 min    Equipment Utilized During Treatment  Gait belt    Activity Tolerance  Patient limited by fatigue;Patient tolerated treatment well    Behavior During Therapy  [facility] for tasks assessed/performed       Past Medical History:  Diagnosis Date  . Atrial fibrillation (HCC)   . Chronic systolic heart failure (HCC)   . COPD (chronic obstructive pulmonary disease) (HCC)   . Osteoarthritis, multiple sites   . Osteoporosis, post-menopausal   . Presence of permanent cardiac pacemaker     Past Surgical History:  Procedure Laterality Date  . CARDIOVERSION  ~2009  . HIP FRACTURE SURGERY Bilateral [phone],1996   each hip fractured at separate times  . INSERT / REPLACE / REMOVE PACEMAKER    . TOTAL HIP ARTHROPLASTY Right 04/20/2018   Procedure: TOTAL HIP CONVERSION;  Surgeon: Kennedy BuckerMenz, Michael, MD;  Location: [facility] ORS;  Service: Orthopedics;  Laterality: Right;    There were no vitals filed for this visit.  Subjective Assessment - 08/03/18 1400    Subjective  Patient reports she has been having difficulty with breathing and but states her LE and hip have been doing well.    interpreted by interpreter and hx supplemented by son   Patient is accompained by:  Interpreter;Family member   Son and seperate interpreter   Pertinent History  CVA with perserved EF, self-reported 'Lung condition', R THA, DVT, wrist fx    Limitations   Lifting;Standing;Walking    Patient Stated Goals  To walk around easier    Currently in Pain?  No/denies    Pain Onset  More than a month ago       TREATMENT Therapeutic Exercise Squats with UE support - x 10  Side stepping up and over 6" step with UE support  -- x5 Forward step ups onto airex pad - x 5  Sit to stands with holding 2kg - x12  Hip abduction in standing with UE support - x 10 B  Hip extension in standing with UE support - x 10 B Side stepping with RTB around knees - x 6 B (5 steps each length) Cone taps from airex pad with 2 sets of cones stacked on top of each other (4 cones) - x 12  Performed exercises in standing and LE strengthening as patient demonstrates increased weakness and poor balance with ADLs   PT Education - 08/03/18 1418    Education Details  form/technique with exercise    Person(s) Educated  Patient    Methods  Explanation;Demonstration    Comprehension  Verbalized understanding;Returned demonstration          PT Long Term Goals - 06/28/18 1621      PT LONG TERM GOAL #1   Title  Patient will be independent with HEP to continue benefits of therapy until  after discharge.     Baseline  Dependent with form/technique; independent with HEP    Time  6    Period  Weeks    Status  Achieved      PT LONG TERM GOAL #2   Title  Patient will be able to walk 135ft with least resistive AD without a sitting rest break to better ambulate at home without requiring rest breaks    Baseline  31ft; 06/28/2018: >418ft    Time  6    Period  Weeks    Status  Achieved      PT LONG TERM GOAL #3   Title  Patient will be able to perform the TUG in under 30sec to indicate singificant improvement gait speed and dynamic balance with use of FWW    Baseline  68sec; 06/28/2018: 25.5sec    Time  6    Period  Weeks    Status  Achieved      PT LONG TERM GOAL #4   Title  Patient will have a worst pain of 3/10 to indicate significant improvement in pain and spasms and be  able to perform functional activities without increase in pain.     Baseline  10/10 worst pain; unable to rate secondary to stomach/back pain    Time  6    Period  Weeks    Status  On-going      PT LONG TERM GOAL #5   Title  Patient will improve gait speed to > .69m/s to indicate significant improvement with ability to community ambulate with decreased fall risk    Baseline  .44 m/s    Time  6    Period  Weeks    Status  New      Additional Long Term Goals   Additional Long Term Goals  Yes      PT LONG TERM GOAL #6   Title  Patient will improve TUG to under 12 sec to improve fall risk and improvement with quality ambulation    Baseline  25.5sec    Time  6    Period  Weeks    Status  New            Plan - 08/03/18 1428    Clinical Impression Statement  Patient demonstrates improvement in LE strength and balance with greater ability to perform exercises with less use of UE support compared to previous sessions. Although patient is improving, she continues to have decreased single leg balance and decreased LE strength. Patient also requires several sitting rest breaks throughout the session indicating poor muscular endurance. Patient will benefit from further skilled therapy.     Rehab Potential  Fair    Clinical Impairments Affecting Rehab Potential  (+) highly motivated, family support (-) age, coormorbities    PT Frequency  2x / week    PT Duration  6 weeks    PT Treatment/Interventions  Electrical Stimulation;Iontophoresis 4mg /ml Dexamethasone;Aquatic Therapy;Cryotherapy;Moist Heat;Therapeutic activities;Patient/family education;Manual techniques;Balance training;Stair training;Gait training;Neuromuscular re-education;Therapeutic exercise;Passive range of motion    PT Next Visit Plan  Progress strengthening and balance    PT Home Exercise Plan  See education section    Consulted and Agree with Plan of Care  Patient       Patient will benefit from skilled therapeutic  intervention in order to improve the following deficits and impairments:  Abnormal gait, Pain, Decreased coordination, Decreased mobility, Increased muscle spasms, Postural dysfunction, Decreased endurance, Decreased range of motion, Decreased strength, Decreased balance, Difficulty walking  Visit Diagnosis: Pain in right hip  Difficulty in walking, not elsewhere classified     Problem List Patient Active Problem List   Diagnosis Date Noted  . Acute DVT (deep venous thrombosis) (HCC) 04/29/2018  . 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 Atrial fibrillation (HCC)   . Chronic systolic heart failure (HCC)   . Osteoporosis, post-menopausal   . Osteoarthritis, multiple sites     [name], PT DPT 08/03/2018, 2:47 PM  [facility] PHYSICAL AND SPORTS MEDICINE [address], [place], [address] Phone: [phone]   Fax:  [phone]  Name: Ocelia Paslay MRN: [id number] Date of Birth: August 01, 1926

## 2018-08-07 ENCOUNTER — Ambulatory Visit: Payer: Medicare Other

## 2018-08-07 DIAGNOSIS — R262 Difficulty in walking, not elsewhere classified: Secondary | ICD-10-CM

## 2018-08-07 DIAGNOSIS — M25551 Pain in right hip: Secondary | ICD-10-CM | POA: Diagnosis not present

## 2018-08-07 DIAGNOSIS — M6281 Muscle weakness (generalized): Secondary | ICD-10-CM

## 2018-08-07 NOTE — Therapy (Signed)
[facility] [address] [place], [address] Phone: [phone]   Fax:  [phone]  Physical Therapy Treatment  Patient Details  Name: Diana Powell MRN: [id number] Date of Birth: 1926-10-10 Referring Provider (PT): Floyce Stakes   Encounter Date: 08/07/2018  PT End of Session - 08/07/18 1812    Visit Number  12    Number of Visits  21    Date for PT Re-Evaluation  08/09/18    Authorization Type  4 /10 G code    PT Start Time  1515    PT Stop Time  1600    PT Time Calculation (min)  45 min    Equipment Utilized During Treatment  Gait belt    Activity Tolerance  Patient limited by fatigue;Patient tolerated treatment well    Behavior During Therapy  [facility] for tasks assessed/performed       Past Medical History:  Diagnosis Date  . Atrial fibrillation (HCC)   . Chronic systolic heart failure (HCC)   . COPD (chronic obstructive pulmonary disease) (HCC)   . Osteoarthritis, multiple sites   . Osteoporosis, post-menopausal   . Presence of permanent cardiac pacemaker     Past Surgical History:  Procedure Laterality Date  . CARDIOVERSION  ~2009  . HIP FRACTURE SURGERY Bilateral [phone]   each hip fractured at separate times  . INSERT / REPLACE / REMOVE PACEMAKER    . TOTAL HIP ARTHROPLASTY Right 04/20/2018   Procedure: TOTAL HIP CONVERSION;  Surgeon: Kennedy Bucker, MD;  Location: [facility] ORS;  Service: Orthopedics;  Laterality: Right;    There were no vitals filed for this visit.  Subjective Assessment - 08/07/18 1754    Subjective  Patient states she was sore after the previous session but states she enjoyed the intensity of the previous treatment. Patient reports no increase in pain since the previous session.   interpreted by interpreter and hx supplemented by son   Patient is accompained by:  Interpreter;Family member   Son and seperate interpreter   Pertinent History  CVA with perserved EF, self-reported  'Lung condition', R THA, DVT, wrist fx    Limitations  Lifting;Standing;Walking    Patient Stated Goals  To walk around easier    Currently in Pain?  No/denies    Pain Onset  More than a month ago        TREATMENT  Therapeutic Exercise   B 6" step-up: 1x5  Sit-to-stand: 1x10  Sit-to-stand med ball (2 kg): 1x10  B Standing TB (Yellow) around knee hip abduction with UE support: 1x10  B Standing TB (Yellow) around knee hip extension with UE support: 1x10  B step-over airex pad with one UE support: 1x10  B 6" step-up: 1x10   B standing Airex pad toe touches on cones (3 cones) with one UE support: 1x10  Hurdle step-overs (3) with UE support (one):  down and back x5  Performed a lot of stepping exercises and prolonged standing exercises to improve standing strength. Patient demonstrates increased fatigue at the end of the session   PT Education - 08/07/18 1811    Education Details  form/technique with exercise    Person(s) Educated  Patient    Methods  Explanation;Demonstration    Comprehension  Verbalized understanding;Returned demonstration          PT Long Term Goals - 06/28/18 1621      PT LONG TERM GOAL #1   Title  Patient will be independent with HEP  to continue benefits of therapy until after discharge.     Baseline  Dependent with form/technique; independent with HEP    Time  6    Period  Weeks    Status  Achieved      PT LONG TERM GOAL #2   Title  Patient will be able to walk 14450ft with least resistive AD without a sitting rest break to better ambulate at home without requiring rest breaks    Baseline  330ft; 06/28/2018: >42450ft    Time  6    Period  Weeks    Status  Achieved      PT LONG TERM GOAL #3   Title  Patient will be able to perform the TUG in under 30sec to indicate singificant improvement gait speed and dynamic balance with use of FWW    Baseline  68sec; 06/28/2018: 25.5sec    Time  6    Period  Weeks    Status  Achieved      PT LONG TERM GOAL #4    Title  Patient will have a worst pain of 3/10 to indicate significant improvement in pain and spasms and be able to perform functional activities without increase in pain.     Baseline  10/10 worst pain; unable to rate secondary to stomach/back pain    Time  6    Period  Weeks    Status  On-going      PT LONG TERM GOAL #5   Title  Patient will improve gait speed to > .233m/s to indicate significant improvement with ability to community ambulate with decreased fall risk    Baseline  .44 m/s    Time  6    Period  Weeks    Status  New      Additional Long Term Goals   Additional Long Term Goals  Yes      PT LONG TERM GOAL #6   Title  Patient will improve TUG to under 12 sec to improve fall risk and improvement with quality ambulation    Baseline  25.5sec    Time  6    Period  Weeks    Status  New            Plan - 08/07/18 1813    Clinical Impression Statement  Patient demonstrates improvement with ability to perform greater amount of exercises compared to previous sessions indicating functional carryover between sessions. Although patient is improving, she conitnues to have difficulty with prolonged standing and is slow to perform walking activities. Patient will benefit from further skilled therapy to return to prior level of function.     Rehab Potential  Fair    Clinical Impairments Affecting Rehab Potential  (+) highly motivated, family support (-) age, coormorbities    PT Frequency  2x / week    PT Duration  6 weeks    PT Treatment/Interventions  Electrical Stimulation;Iontophoresis 4mg /ml Dexamethasone;Aquatic Therapy;Cryotherapy;Moist Heat;Therapeutic activities;Patient/family education;Manual techniques;Balance training;Stair training;Gait training;Neuromuscular re-education;Therapeutic exercise;Passive range of motion    PT Next Visit Plan  Progress strengthening and balance    PT Home Exercise Plan  See education section    Consulted and Agree with Plan of Care   Patient       Patient will benefit from skilled therapeutic intervention in order to improve the following deficits and impairments:  Abnormal gait, Pain, Decreased coordination, Decreased mobility, Increased muscle spasms, Postural dysfunction, Decreased endurance, Decreased range of motion, Decreased strength, Decreased balance, Difficulty walking  Visit  Diagnosis: Pain in right hip  Difficulty in walking, not elsewhere classified  Muscle weakness (generalized)     Problem List Patient Active Problem List   Diagnosis Date Noted  . Acute DVT (deep venous thrombosis) (HCC) 04/29/2018  . 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 Atrial fibrillation (HCC)   . Chronic systolic heart failure (HCC)   . Osteoporosis, post-menopausal   . Osteoarthritis, multiple sites     Myrene GalasWesley Dava Rensch, PT DPT 08/07/2018, 6:22 PM  [facility]ORTS MEDICINE [address] [place], [place], [address] Phone: [phone]   Fax:  [phone]  Name: Diana Powell MRN: [id number] Date of Birth: 1927/01/16

## 2018-08-16 ENCOUNTER — Ambulatory Visit: Payer: Medicare Other

## 2018-08-16 DIAGNOSIS — M6281 Muscle weakness (generalized): Secondary | ICD-10-CM

## 2018-08-16 DIAGNOSIS — R262 Difficulty in walking, not elsewhere classified: Secondary | ICD-10-CM

## 2018-08-16 DIAGNOSIS — M25551 Pain in right hip: Secondary | ICD-10-CM

## 2018-08-16 NOTE — Therapy (Signed)
[facility] [address] [place], [place], [address] Phone: [phone]   Fax:  [phone]  Physical Therapy Treatment  Patient Details  Name: Diana HotterGalina Powell MRN: [id number] Date of Birth: 10-04-1926 Referring Provider (PT): Floyce StakesGaines   Encounter Date: 08/16/2018  PT End of Session - 08/16/18 1448    Visit Number  13    Number of Visits  21    Date for PT Re-Evaluation  08/09/18    Authorization Type  5 /10 G code    PT Start Time  1430    PT Stop Time  1515    PT Time Calculation (min)  45 min    Equipment Utilized During Treatment  Gait belt    Activity Tolerance  Patient limited by fatigue;Patient tolerated treatment well    Behavior During Therapy  [facility] for tasks assessed/performed       Past Medical History:  Diagnosis Date  . Atrial fibrillation (HCC)   . Chronic systolic heart failure (HCC)   . COPD (chronic obstructive pulmonary disease) (HCC)   . Osteoarthritis, multiple sites   . Osteoporosis, post-menopausal   . Presence of permanent cardiac pacemaker     Past Surgical History:  Procedure Laterality Date  . CARDIOVERSION  ~2009  . HIP FRACTURE SURGERY Bilateral [phone],1996   each hip fractured at separate times  . INSERT / REPLACE / REMOVE PACEMAKER    . TOTAL HIP ARTHROPLASTY Right 04/20/2018   Procedure: TOTAL HIP CONVERSION;  Surgeon: Kennedy BuckerMenz, Michael, MD;  Location: [facility] ORS;  Service: Orthopedics;  Laterality: Right;    There were no vitals filed for this visit.  Subjective Assessment - 08/16/18 1449    Subjective  Patient repots she has been performing her exercises and states she is mostly limited by her breathing but states she feels she conitnues to walk slowly and with greater unsteadiness.    interpreted by interpreter and hx supplemented by son   Patient is accompained by:  Interpreter;Family member   Son and seperate interpreter   Pertinent History  CVA with perserved EF, self-reported  'Lung condition', R THA, DVT, wrist fx    Limitations  Lifting;Standing;Walking    Patient Stated Goals  To walk around easier    Currently in Pain?  No/denies    Pain Onset  More than a month ago       TREATMENT  Therapeutic Exercise              B 6" step-ups up (4 steps) : 2x3 with use of each UE for a railing             Sit-to-stand: 1x5 for speed             Sit-to-stand med ball (3 kg): 1x10             B Standing TB (Yellow) around knee hip abduction with UE support: 1x10             B Standing TB (Yellow) around knee hip extension with UE support: 1x10  Ambulation for speed - 2 x 10 meters in standing  B 8" step-up: 1x10             B step-over airex pad with one UE support: 1x10             B standing Airex pad toe touches on cones (3 cones) with one UE support: 1x10  Hurdle step-overs (3) with UE support (one):  down and back x5   Performed a lot of stepping exercises and prolonged standing exercises to improve standing strength and exercise. Patient demonstrates increased fatigue at the end of the session   PT Education - 08/16/18 1504    Education Details  form/technique with exercise    Person(s) Educated  Patient    Methods  Explanation;Demonstration    Comprehension  Verbalized understanding;Returned demonstration          PT Long Term Goals - 08/16/18 1439      PT LONG TERM GOAL #1   Title  Patient will be independent with HEP to continue benefits of therapy until after discharge.     Baseline  Dependent with form/technique; independent with HEP    Time  6    Period  Weeks    Status  Achieved      PT LONG TERM GOAL #2   Title  Patient will be able to walk 174ft with least resistive AD without a sitting rest break to better ambulate at home without requiring rest breaks    Baseline  61ft; 06/28/2018: >417ft    Time  6    Period  Weeks    Status  Achieved      PT LONG TERM GOAL #3   Title  Patient will be able to perform the TUG in under  30sec to indicate singificant improvement gait speed and dynamic balance with use of FWW    Baseline  68sec; 06/28/2018: 25.5sec    Time  6    Period  Weeks    Status  Achieved      PT LONG TERM GOAL #4   Title  Patient will have a worst pain of 3/10 to indicate significant improvement in pain and spasms and be able to perform functional activities without increase in pain.     Baseline  10/10 worst pain; unable to rate secondary to stomach/back pain; 08/16/2018: 0/ 10    Time  6    Period  Weeks    Status  Achieved      PT LONG TERM GOAL #5   Title  Patient will improve gait speed to > .40m/s to indicate significant improvement with ability to community ambulate with decreased fall risk    Baseline  .44 m/s; 08/16/2018: .85 m/s    Time  6    Period  Weeks    Status  Achieved      Additional Long Term Goals   Additional Long Term Goals  Yes      PT LONG TERM GOAL #6   Title  Patient will improve TUG to under 12 sec to improve fall risk and improvement with quality ambulation    Baseline  25.5sec; 08/16/2018: 15 sec     Time  6    Period  Weeks    Status  On-going      PT LONG TERM GOAL #7   Title  Patient will improve 5 x STS to under 14sec to decrease fall risk and improve LE functional strength.    Baseline  19sec    Time  6    Period  Weeks    Status  New              Patient will benefit from skilled therapeutic intervention in order to improve the following deficits and impairments:     Visit Diagnosis: Pain in right hip  Difficulty in walking, not elsewhere classified  Muscle weakness (generalized)     Problem List Patient Active Problem List   Diagnosis Date Noted  . Acute DVT (deep venous thrombosis) (HCC) 04/29/2018  . 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 Atrial fibrillation (HCC)   . Chronic systolic heart failure (HCC)   . Osteoporosis,  post-menopausal   . Osteoarthritis, multiple sites     Diana GalasWesley Janica Powell, PT DPT 08/16/2018, 3:30 PM  [facility] [address] [place], [place], [address] Phone: [phone]   Fax:  [phone]  Name: Diana HotterGalina Monteleone MRN: [id number] Date of Birth: [birth year]

## 2018-08-23 ENCOUNTER — Ambulatory Visit: Payer: Medicare Other | Attending: Orthopedic Surgery

## 2018-08-23 DIAGNOSIS — M25551 Pain in right hip: Secondary | ICD-10-CM

## 2018-08-23 DIAGNOSIS — M6281 Muscle weakness (generalized): Secondary | ICD-10-CM

## 2018-08-23 DIAGNOSIS — R262 Difficulty in walking, not elsewhere classified: Secondary | ICD-10-CM | POA: Diagnosis present

## 2018-08-23 NOTE — Therapy (Signed)
[facility] [address] [place], [place], [address] Phone: [phone]   Fax:  [phone]  Physical Therapy Treatment  Patient Details  Name: Diana Powell MRN: [id number] Date of Birth: [birth year] Referring Provider (PT): Floyce StakesGaines   Encounter Date: 08/23/2018  PT End of Session - 08/23/18 1533    Visit Number  14    Number of Visits  21    Date for PT Re-Evaluation  09/27/18    Authorization Type  1 /10 G code    PT Start Time  1445    PT Stop Time  1530    PT Time Calculation (min)  45 min    Equipment Utilized During Treatment  Gait belt    Activity Tolerance  Patient limited by fatigue;Patient tolerated treatment well    Behavior During Therapy  [facility] for tasks assessed/performed       Past Medical History:  Diagnosis Date  . Atrial fibrillation (HCC)   . Chronic systolic heart failure (HCC)   . COPD (chronic obstructive pulmonary disease) (HCC)   . Osteoarthritis, multiple sites   . Osteoporosis, post-menopausal   . Presence of permanent cardiac pacemaker     Past Surgical History:  Procedure Laterality Date  . CARDIOVERSION  ~2009  . HIP FRACTURE SURGERY Bilateral [phone]   each hip fractured at separate times  . INSERT / REPLACE / REMOVE PACEMAKER    . TOTAL HIP ARTHROPLASTY Right 04/20/2018   Procedure: TOTAL HIP CONVERSION;  Surgeon: Kennedy BuckerMenz, Michael, MD;  Location: [facility] ORS;  Service: Orthopedics;  Laterality: Right;    There were no vitals filed for this visit.  Subjective Assessment - 08/23/18 1522    Subjective  Patient's son reports her daughter has been having difficulty breathing today which happens a couple of days a month. Patient's son tates he is worried about his mom going up and down the stairs.    interpreted by interpreter and hx supplemented by son   Patient is accompained by:  Interpreter;Family member   Son and seperate interpreter   Pertinent History  CVA with perserved EF,  self-reported 'Lung condition', R THA, DVT, wrist fx    Limitations  Lifting;Standing;Walking    Patient Stated Goals  To walk around easier    Currently in Pain?  No/denies    Pain Onset  More than a month ago       TREATMENT  Therapeutic Exercise              B 6" step-ups up - x7 with use of each UE for a railing             B Standing TB (Yellow) around knee side steping - x 3 (3120ft each way)            B standing Airex pad toe touches on cones (5 cones) with one UE support: 1x10 Side stepping up and over 6" step - x 20               Standing on airex shoulder rows with RTB - x 20    Performed a lot of stepping exercises and prolonged standing exercises to improve standing strength and exercise. Patient demonstrates increased fatigue at the end of the session   PT Education - 08/23/18 1533    Education Details  form/technique with exercise    Person(s) Educated  Patient    Methods  Explanation;Demonstration    Comprehension  Verbalized understanding;Returned demonstration  PT Long Term Goals - 08/16/18 1439      PT LONG TERM GOAL #1   Title  Patient will be independent with HEP to continue benefits of therapy until after discharge.     Baseline  Dependent with form/technique; independent with HEP    Time  6    Period  Weeks    Status  Achieved      PT LONG TERM GOAL #2   Title  Patient will be able to walk 128ft with least resistive AD without a sitting rest break to better ambulate at home without requiring rest breaks    Baseline  65ft; 06/28/2018: >432ft    Time  6    Period  Weeks    Status  Achieved      PT LONG TERM GOAL #3   Title  Patient will be able to perform the TUG in under 30sec to indicate singificant improvement gait speed and dynamic balance with use of FWW    Baseline  68sec; 06/28/2018: 25.5sec    Time  6    Period  Weeks    Status  Achieved      PT LONG TERM GOAL #4   Title  Patient will have a worst pain of 3/10 to indicate  significant improvement in pain and spasms and be able to perform functional activities without increase in pain.     Baseline  10/10 worst pain; unable to rate secondary to stomach/back pain; 08/16/2018: 0/ 10    Time  6    Period  Weeks    Status  Achieved      PT LONG TERM GOAL #5   Title  Patient will improve gait speed to > .68m/s to indicate significant improvement with ability to community ambulate with decreased fall risk    Baseline  .44 m/s; 08/16/2018: .85 m/s    Time  6    Period  Weeks    Status  Achieved      Additional Long Term Goals   Additional Long Term Goals  Yes      PT LONG TERM GOAL #6   Title  Patient will improve TUG to under 12 sec to improve fall risk and improvement with quality ambulation    Baseline  25.5sec; 08/16/2018: 15 sec     Time  6    Period  Weeks    Status  On-going      PT LONG TERM GOAL #7   Title  Patient will improve 5 x STS to under 14sec to decrease fall risk and improve LE functional strength.    Baseline  19sec    Time  6    Period  Weeks    Status  New            Plan - 08/23/18 1903    Clinical Impression Statement  Patient demonstrates ability to perform less exercises compared to previous sessions indicating decreased cardiovascular and muscular endurance. Patient will continue to benefit from further skilled therapy focused on improving strengthening, balancing and endurance. Patient will benefit from furhter skilled therapy to return to prior level of function.     Rehab Potential  Fair    Clinical Impairments Affecting Rehab Potential  (+) highly motivated, family support (-) age, coormorbities    PT Frequency  2x / week    PT Duration  6 weeks    PT Treatment/Interventions  Electrical Stimulation;Iontophoresis 4mg /ml Dexamethasone;Aquatic Therapy;Cryotherapy;Moist Heat;Therapeutic activities;Patient/family education;Manual techniques;Balance training;Stair training;Gait training;Neuromuscular re-education;Therapeutic  exercise;Passive  range of motion    PT Next Visit Plan  Progress strengthening and balance    PT Home Exercise Plan  See education section    Consulted and Agree with Plan of Care  Patient       Patient will benefit from skilled therapeutic intervention in order to improve the following deficits and impairments:  Abnormal gait, Pain, Decreased coordination, Decreased mobility, Increased muscle spasms, Postural dysfunction, Decreased endurance, Decreased range of motion, Decreased strength, Decreased balance, Difficulty walking  Visit Diagnosis: Pain in right hip  Difficulty in walking, not elsewhere classified  Muscle weakness (generalized)     Problem List Patient Active Problem List   Diagnosis Date Noted  . Acute DVT (deep venous thrombosis) (HCC) 04/29/2018  . 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 Atrial fibrillation (HCC)   . Chronic systolic heart failure (HCC)   . Osteoporosis, post-menopausal   . Osteoarthritis, multiple sites     Myrene GalasWesley Jennafer Gladue, PT DPT 08/23/2018, 7:10 PM  [facility] [address] [place], [place], [address] Phone: [phone]   Fax:  [phone]  Name: Diana Powell MRN: [id number] Date of Birth: [birth year]

## 2018-08-30 ENCOUNTER — Ambulatory Visit: Payer: Medicare Other

## 2018-09-06 ENCOUNTER — Ambulatory Visit: Payer: Medicare Other

## 2018-09-06 DIAGNOSIS — M25551 Pain in right hip: Secondary | ICD-10-CM

## 2018-09-06 DIAGNOSIS — R262 Difficulty in walking, not elsewhere classified: Secondary | ICD-10-CM

## 2018-09-06 DIAGNOSIS — M6281 Muscle weakness (generalized): Secondary | ICD-10-CM

## 2018-09-06 NOTE — Therapy (Signed)
[facility] [address], [place], [address] Phone: [phone]   Fax:  [phone]  Physical Therapy Treatment  Patient Details  Name: Diana Powell MRN: [id number] Date of Birth: [birth year] Referring Provider (PT): Floyce Stakes   Encounter Date: 09/06/2018  PT End of Session - 09/06/18 1356    Visit Number  15    Number of Visits  21    Date for PT Re-Evaluation  09/27/18    Authorization Type  2 /10 G code    PT Start Time  1345    PT Stop Time  1430    PT Time Calculation (min)  45 min    Equipment Utilized During Treatment  Gait belt    Activity Tolerance  Patient limited by fatigue;Patient tolerated treatment well    Behavior During Therapy  [facility] for tasks assessed/performed       Past Medical History:  Diagnosis Date  . Atrial fibrillation (HCC)   . Chronic systolic heart failure (HCC)   . COPD (chronic obstructive pulmonary disease) (HCC)   . Osteoarthritis, multiple sites   . Osteoporosis, post-menopausal   . Presence of permanent cardiac pacemaker     Past Surgical History:  Procedure Laterality Date  . CARDIOVERSION  ~2009  . HIP FRACTURE SURGERY Bilateral [phone]   each hip fractured at separate times  . INSERT / REPLACE / REMOVE PACEMAKER    . TOTAL HIP ARTHROPLASTY Right 04/20/2018   Procedure: TOTAL HIP CONVERSION;  Surgeon: Kennedy Bucker, MD;  Location: [facility] ORS;  Service: Orthopedics;  Laterality: Right;    There were no vitals filed for this visit.  Subjective Assessment - 09/06/18 1348    Subjective  Patient's son reports his mother has in increased pain along other parts of her body unrelated to the hip. Patient's son reports his mother has had less energy    interpreted by interpreter and hx supplemented by son   Patient is accompained by:  Interpreter;Family member   Son and seperate interpreter   Pertinent History  CVA with perserved EF, self-reported 'Lung condition', R  THA, DVT, wrist fx    Limitations  Lifting;Standing;Walking    Patient Stated Goals  To walk around easier    Currently in Pain?  No/denies    Pain Onset  More than a month ago       TREATMENT  Therapeutic Exercise   Sit to stand with holding 2kg - x 10   Hip abduction with YTB - x 10 B  Hip abduction in sitting with YTB - x 10   Hip extension in standing with YTB - x10  Lumbar extension in standing with arms anchored on rail performing CKC - x 10             B 6" step-ups up - x7 with use of each UE for a railing  Side stepping up and down 6" step - x 10 with B UE support  Cone pick ups from airex pad with cones on a step - x 20    Performed a lot of stepping exercises and prolonged standing exercises to improve standing strength and exercise. Patient requires cueing for proper posture during exercise performance. Patient demonstrates increased fatigue at the end of the session.   PT Education - 09/06/18 1356    Education Details  form/technique with exercise    Person(s) Educated  Patient    Methods  Explanation;Demonstration    Comprehension  Verbalized understanding;Returned  demonstration          PT Long Term Goals - 08/16/18 1439      PT LONG TERM GOAL #1   Title  Patient will be independent with HEP to continue benefits of therapy until after discharge.     Baseline  Dependent with form/technique; independent with HEP    Time  6    Period  Weeks    Status  Achieved      PT LONG TERM GOAL #2   Title  Patient will be able to walk 115ft with least resistive AD without a sitting rest break to better ambulate at home without requiring rest breaks    Baseline  66ft; 06/28/2018: >461ft    Time  6    Period  Weeks    Status  Achieved      PT LONG TERM GOAL #3   Title  Patient will be able to perform the TUG in under 30sec to indicate singificant improvement gait speed and dynamic balance with use of FWW    Baseline  68sec; 06/28/2018: 25.5sec    Time  6    Period   Weeks    Status  Achieved      PT LONG TERM GOAL #4   Title  Patient will have a worst pain of 3/10 to indicate significant improvement in pain and spasms and be able to perform functional activities without increase in pain.     Baseline  10/10 worst pain; unable to rate secondary to stomach/back pain; 08/16/2018: 0/ 10    Time  6    Period  Weeks    Status  Achieved      PT LONG TERM GOAL #5   Title  Patient will improve gait speed to > .47m/s to indicate significant improvement with ability to community ambulate with decreased fall risk    Baseline  .44 m/s; 08/16/2018: .85 m/s    Time  6    Period  Weeks    Status  Achieved      Additional Long Term Goals   Additional Long Term Goals  Yes      PT LONG TERM GOAL #6   Title  Patient will improve TUG to under 12 sec to improve fall risk and improvement with quality ambulation    Baseline  25.5sec; 08/16/2018: 15 sec     Time  6    Period  Weeks    Status  On-going      PT LONG TERM GOAL #7   Title  Patient will improve 5 x STS to under 14sec to decrease fall risk and improve LE functional strength.    Baseline  19sec    Time  6    Period  Weeks    Status  New            Plan - 09/06/18 1434    Clinical Impression Statement  Patient demonstrates improvement with exercises with ability to perform greater amount of stepping compared to previous sessions. Patient continues to require UE supprot for most stepping activities, although intermittently can perform without UE support indicaitng decreased confidence with movement versus decreased functional ability. Patient continues to demonstrate improvement with perform of exercises and will benefit from further skilled therapy to return to prior level of function.     Rehab Potential  Fair    Clinical Impairments Affecting Rehab Potential  (+) highly motivated, family support (-) age, coormorbities    PT Frequency  2x / week  PT Duration  6 weeks    PT Treatment/Interventions   Electrical Stimulation;Iontophoresis 4mg /ml Dexamethasone;Aquatic Therapy;Cryotherapy;Moist Heat;Therapeutic activities;Patient/family education;Manual techniques;Balance training;Stair training;Gait training;Neuromuscular re-education;Therapeutic exercise;Passive range of motion    PT Next Visit Plan  Progress strengthening and balance    PT Home Exercise Plan  See education section    Consulted and Agree with Plan of Care  Patient       Patient will benefit from skilled therapeutic intervention in order to improve the following deficits and impairments:  Abnormal gait, Pain, Decreased coordination, Decreased mobility, Increased muscle spasms, Postural dysfunction, Decreased endurance, Decreased range of motion, Decreased strength, Decreased balance, Difficulty walking  Visit Diagnosis: Pain in right hip  Difficulty in walking, not elsewhere classified  Muscle weakness (generalized)     Problem List Patient Active Problem List   Diagnosis Date Noted  . Acute DVT (deep venous thrombosis) (HCC) 04/29/2018  . 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 Atrial fibrillation (HCC)   . Chronic systolic heart failure (HCC)   . Osteoporosis, post-menopausal   . Osteoarthritis, multiple sites     Myrene GalasWesley Chirstine Defrain, PT DPT 09/06/2018, 2:45 PM  [facility] [address] [place], [place], [address] Phone: [phone]   Fax:  [phone]  Name: Diana Powell MRN: [id number] Date of Birth: October 06, 1926

## 2018-09-13 ENCOUNTER — Ambulatory Visit: Payer: Medicare Other

## 2018-09-13 DIAGNOSIS — M25551 Pain in right hip: Secondary | ICD-10-CM

## 2018-09-13 DIAGNOSIS — M6281 Muscle weakness (generalized): Secondary | ICD-10-CM

## 2018-09-13 DIAGNOSIS — R262 Difficulty in walking, not elsewhere classified: Secondary | ICD-10-CM

## 2018-09-13 NOTE — Therapy (Signed)
[place] PHYSICAL AND SPORTS MEDICINE [address] [place], [address] Phone: [phone]   Fax:  [phone]  Physical Therapy Treatment/Discharge Summary  Patient Details  Name: Diana Powell MRN: [id number] Date of Birth: Dec 04, 1926 Referring Provider (PT): Arvella Nigh   Encounter Date: 09/13/2018  PT End of Session - 09/13/18 1413    Visit Number  16    Number of Visits  21    Date for PT Re-Evaluation  09/27/18    Authorization Type  3 /10 G code    PT Start Time  1345    PT Stop Time  1430    PT Time Calculation (min)  45 min    Equipment Utilized During Treatment  Gait belt    Activity Tolerance  Patient limited by fatigue;Patient tolerated treatment well    Behavior During Therapy  [facility] for tasks assessed/performed       Past Medical History:  Diagnosis Date  . Atrial fibrillation ([place])   . Chronic systolic heart failure ([place])   . COPD (chronic obstructive pulmonary disease) ([place])   . Osteoarthritis, multiple sites   . Osteoporosis, post-menopausal   . Presence of permanent cardiac pacemaker     Past Surgical History:  Procedure Laterality Date  . CARDIOVERSION  ~2009  . HIP FRACTURE SURGERY Bilateral [phone]   each hip fractured at separate times  . INSERT / REPLACE / REMOVE PACEMAKER    . TOTAL HIP ARTHROPLASTY Right 04/20/2018   Procedure: TOTAL HIP CONVERSION;  Surgeon: Hessie Knows, MD;  Location: [facility] ORS;  Service: Orthopedics;  Laterality: Right;    There were no vitals filed for this visit.  Subjective Assessment - 09/13/18 1408    Subjective  Patient reports she has had increased fatigue over the past month and would like to stop therapy until after her cataract surgery in March.    interpreted by interpreter and hx supplemented by son   Patient is accompained by:  Interpreter;Family member   Son and seperate interpreter   Pertinent History  CVA with perserved EF, self-reported 'Lung condition', R THA,  DVT, wrist fx    Limitations  Lifting;Standing;Walking    Patient Stated Goals  To walk around easier    Currently in Pain?  No/denies    Pain Onset  More than a month ago        TREATMENT  Therapeutic Exercise              Sit to stand with holding 2kg - x 10   Side stepping with RTB - x 20   Hip extension in standing with RTB - x10             Hip abduction with RTB - x 10 B  Step ups (4steps up/down) with Unilateral UE support - x 5   Hip abduction in sitting with RTB - x 10   Backwards ambulation in standing - 2 x 57f  Ambulation with focus on improve speed with turning - x 3    Performed a lot of stepping exercises and prolonged standing exercises to improve standing strength and exercise. Patient requires cueing for proper posture during exercise performance. Patient demonstrates increased fatigue at the end of the session.    PT Education - 09/13/18 1412    Education Details  form/technique with exercise    Person(s) Educated  Patient    Methods  Explanation;Demonstration    Comprehension  Verbalized understanding;Returned demonstration  PT Long Term Goals - 09/13/18 1451      PT LONG TERM GOAL #1   Title  Patient will be independent with HEP to continue benefits of therapy until after discharge.     Baseline  Dependent with form/technique; independent with HEP    Time  6    Period  Weeks    Status  Achieved      PT LONG TERM GOAL #2   Title  Patient will be able to walk 173f with least resistive AD without a sitting rest break to better ambulate at home without requiring rest breaks    Baseline  382f 06/28/2018: >45030f  Time  6    Period  Weeks    Status  Achieved      PT LONG TERM GOAL #3   Title  Patient will be able to perform the TUG in under 30sec to indicate singificant improvement gait speed and dynamic balance with use of FWW    Baseline  68sec; 06/28/2018: 25.5sec    Time  6    Period  Weeks    Status  Achieved      PT LONG TERM  GOAL #4   Title  Patient will have a worst pain of 3/10 to indicate significant improvement in pain and spasms and be able to perform functional activities without increase in pain.     Baseline  10/10 worst pain; unable to rate secondary to stomach/back pain; 08/16/2018: 0/ 10    Time  6    Period  Weeks    Status  Achieved      PT LONG TERM GOAL #5   Title  Patient will improve gait speed to > .67m/44mo indicate significant improvement with ability to community ambulate with decreased fall risk    Baseline  .44 m/s; 08/16/2018: .85 m/s    Time  6    Period  Weeks    Status  Achieved      PT LONG TERM GOAL #6   Title  Patient will improve TUG to under 12 sec to improve fall risk and improvement with quality ambulation    Baseline  25.5sec; 08/16/2018: 15 sec; 09/13/2018: 12sec    Time  6    Period  Weeks    Status  Achieved      PT LONG TERM GOAL #7   Title  Patient will improve 5 x STS to under 14sec to decrease fall risk and improve LE functional strength.    Baseline  19sec; 09/13/2018: 15sec    Time  6    Period  Weeks    Status  On-going            Plan - 09/13/18 1447    Clinical Impression Statement  Patient has made even greater improvements towards reaching her goals compared to previous sessions. Patient demonstrates overall improvement with 5xSTS and TUG compared to previous sessions. Patient has met or partially met all long term goals and will benefit from further skilled therapy to return to prior level of function.     Rehab Potential  Fair    Clinical Impairments Affecting Rehab Potential  (+) highly motivated, family support (-) age, coormorbities    PT Frequency  2x / week    PT Duration  6 weeks    PT Treatment/Interventions  Electrical Stimulation;Iontophoresis 4mg/65mDexamethasone;Aquatic Therapy;Cryotherapy;Moist Heat;Therapeutic activities;Patient/family education;Manual techniques;Balance training;Stair training;Gait training;Neuromuscular  re-education;Therapeutic exercise;Passive range of motion    PT Next Visit Plan  Progress strengthening and balance    PT Home Exercise Plan  See education section    Consulted and Agree with Plan of Care  Patient       Patient will benefit from skilled therapeutic intervention in order to improve the following deficits and impairments:  Abnormal gait, Pain, Decreased coordination, Decreased mobility, Increased muscle spasms, Postural dysfunction, Decreased endurance, Decreased range of motion, Decreased strength, Decreased balance, Difficulty walking  Visit Diagnosis: Pain in right hip  Muscle weakness (generalized)  Difficulty in walking, not elsewhere classified     Problem List Patient Active Problem List   Diagnosis Date Noted  . Acute DVT (deep venous thrombosis) ([place]) 04/29/2018  . 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 Dementia due to medical condition without behavioral disturbance ([place]) 05/04/2013  . Atrial fibrillation ([place])   . Chronic systolic heart failure ([place])   . Osteoporosis, post-menopausal   . Osteoarthritis, multiple sites     Blythe Stanford, PT DPT 09/13/2018, 2:53 PM  [place] [facility] [address], [place], [address] Phone: [phone]   Fax:  [phone]  Name: Diana Powell MRN: [id number] Date of Birth: 01-Apr-1927

## 2019-05-01 ENCOUNTER — Ambulatory Visit: Payer: Medicare Other | Attending: Geriatric Medicine

## 2019-05-01 ENCOUNTER — Other Ambulatory Visit: Payer: Self-pay

## 2019-05-01 DIAGNOSIS — M25551 Pain in right hip: Secondary | ICD-10-CM | POA: Diagnosis not present

## 2019-05-01 DIAGNOSIS — M6281 Muscle weakness (generalized): Secondary | ICD-10-CM | POA: Diagnosis present

## 2019-05-01 DIAGNOSIS — R262 Difficulty in walking, not elsewhere classified: Secondary | ICD-10-CM | POA: Insufficient documentation

## 2019-05-02 NOTE — Therapy (Signed)
[facility] [address], [place], [address] Phone: [phone]   Fax:  [phone]  Physical Therapy Evaluation  Patient Details  Name: Diana Powell MRN: [id number] Date of Birth: [birth year] Referring Provider (PT): Frazier Butt MD   Encounter Date: 05/01/2019  PT End of Session - 05/02/19 1004    Visit Number  1    Number of Visits  16    Date for PT Re-Evaluation  06/12/19    PT Start Time  0945    PT Stop Time  1030    PT Time Calculation (min)  45 min    Equipment Utilized During Treatment  Gait belt    Activity Tolerance  Patient tolerated treatment well;Patient limited by fatigue    Behavior During Therapy  [facility] for tasks assessed/performed       Past Medical History:  Diagnosis Date  . Atrial fibrillation ([place])   . Chronic systolic heart failure ([place])   . COPD (chronic obstructive pulmonary disease) ([place])   . Osteoarthritis, multiple sites   . Osteoporosis, post-menopausal   . Presence of permanent cardiac pacemaker     Past Surgical History:  Procedure Laterality Date  . CARDIOVERSION  ~2009  . HIP FRACTURE SURGERY Bilateral [phone]   each hip fractured at separate times  . INSERT / REPLACE / REMOVE PACEMAKER    . TOTAL HIP ARTHROPLASTY Right 04/20/2018   Procedure: TOTAL HIP CONVERSION;  Surgeon: Hessie Knows, MD;  Location: [facility] ORS;  Service: Orthopedics;  Laterality: Right;    There were no vitals filed for this visit.   Subjective Assessment - 05/02/19 0936    Subjective  Patient is accompanied by her son during today's session. Patient reports she is having difficulty with walking for long periods of time without DOE. Patient demonstrates no significant increase in pain, just intermittent Low back pain with prolonged sitting. Patient also reports she has been walking slower overall and an overall increase in difficulty with performing prolonged standing. Patient states she has  been ambulating around the house but overall has had a decrease in function since the start of the COVID crisis. Patient reports no falls for the past 6 months. Patient states she freqently states decreased balance overall but states increased difficulty with standing without support. Patient reports she has been using a FWW to ambulate for all distances.   interpreted by interpreter and hx supplemented by son   Patient is accompained by:  Interpreter;Family member   Son and seperate interpreter   Pertinent History  CVA with perserved EF, self-reported 'Lung condition', R THA, DVT, wrist fx    Limitations  Lifting;Standing;Walking    Patient Stated Goals  To walk around easier    Currently in Pain?  No/denies    Pain Onset  More than a month ago         [facility] PT Assessment - 05/02/19 0001      Assessment   Medical Diagnosis  Weakness and balance difficulties    Referring Provider (PT)  Bloomberg MD    Onset Date/Surgical Date  04/20/18    Hand Dominance  Right    Next MD Visit  UNKNOWN    Prior Therapy  yes - wrist and THA      Balance Screen   Has the patient fallen in the past 6 months  No    Has the patient had a decrease in activity level because of a fear of falling?   Yes  Is the patient reluctant to leave their home because of a fear of falling?   No      Prior Function   Level of Independence  Independent    Vocation  Retired    Gaffer  N/A    Leisure  watching TV      Cognition   Overall Cognitive Status  Within Functional Limits for tasks assessed      Observation/Other Assessments   Observations  Heavy use of UE with FWW      Sensation   Light Touch  Appears Intact      Functional Tests   Functional tests  Sit to Stand      Sit to Stand   Comments  Able to perform without UE but with momentum      Posture/Postural Control   Posture Comments  slumped forward along lumbar spine      ROM / Strength   AROM / PROM / Strength  Strength       AROM   AROM Assessment Site  Hip    Right/Left Hip  Right;Left    Right Hip Flexion  100    Right Hip External Rotation   15    Right Hip Internal Rotation   25    Right Hip ABduction  25   in sitting   Right Hip ADduction  20   in sitting   Left Hip Flexion  115    Left Hip External Rotation   25    Left Hip Internal Rotation   25    Left Hip ABduction  25    Left Hip ADduction  25      Strength   Strength Assessment Site  Hip;Knee    Right Hip Flexion  3+/5    Right Hip External Rotation   3-/5    Right Hip Internal Rotation  3+/5    Right Hip ABduction  4/5    Right Hip ADduction  4+/5    Left Hip Flexion  4-/5    Left Hip External Rotation  4-/5    Left Hip Internal Rotation  4/5    Left Hip ABduction  4-/5    Left Hip ADduction  4/5    Right/Left Knee  Right;Left    Right Knee Flexion  4/5    Right Knee Extension  4+/5    Left Knee Flexion  4+/5    Left Knee Extension  5/5      Transfers   Five time sit to stand comments   27 sec       Ambulation/Gait   Gait Comments  Use of FWW; decreased step length B,heavy use of hands with amb, decreased foot clearance B, heavy use of hands during amb      6 minute walk test results    Endurance additional comments  375 ft in and 45 sec before requiring sitting rest break      Standardized Balance Assessment   Standardized Balance Assessment  10 meter walk test    10 Meter Walk  .37 m/s      Timed Up and Go Test   Normal TUG (seconds)  27       Objective measurements completed on examination: See above findings.   TREATMENT Therapeutic Exercise Sit to stands -- x 10  Ambulation with focus on improving speed and performing through >8/10 RPEs  Performed exercises to improve balance, address LE weakness, and to subsequently decrease fall risk.  PT Education - 05/02/19 1003    Education Details  form/technique with exercise; HEP: sit to stands, ambulating for exercise    Person(s) Educated   Patient    Methods  Explanation;Demonstration    Comprehension  Verbalized understanding;Returned demonstration          PT Long Term Goals - 05/02/19 1318      PT LONG TERM GOAL #1   Title  Patient will be independent with HEP to continue benefits of therapy until after discharge.     Baseline  Dependent with form/technique    Time  6    Period  Weeks    Status  New      PT LONG TERM GOAL #2   Title  Patient will be able to ambulate over 752ft ith to improve cardiovascular endurance and improve ability to walk in the community.    Baseline  339ft    Time  6    Period  Weeks    Status  New      PT LONG TERM GOAL #3   Title  Patient will be able to perform the TUG in under 12 sec to indicate singificant improvement gait speed and dynamic balance with use of FWW    Baseline  27sec    Time  6    Period  Weeks    Status  New      PT LONG TERM GOAL #4   Title  Patient will improve 5 x STS to under 12sec to decrease fall risk and improve LE functional strength.    Baseline  5xSTS: 27 sec    Time  6    Period  Weeks    Status  New      PT LONG TERM GOAL #5   Title  Patient will improve gait speed to > .59m/s to indicate significant improvement with ability to community ambulate with decreased fall risk    Baseline  .37 m/s    Time  6    Period  Weeks    Status  New             Plan - 05/02/19 1006    Clinical Impression Statement  Patient is a 85 yo right hand dominant female that demosntrates increased fall risk as indicated by poor scores with TUG, 5xSTS and . Patient also demonstrates poor cardiovasuclar and muscular endurance demonstrating increased difficulty with performing . Patient demonstrates balance difficulties as well although primary issue seems to be cardiovascular endurance and LE strength. Patient will benefit from further skilled therapy focused on improving walking and improving overall LE strength.    Personal Factors and  Comorbidities  Comorbidity 2    Comorbidities  DVT, THA, CVA, "lung disorder"    Examination-Activity Limitations  Carry;Lift;Locomotion Level;Stand;Stairs    Examination-Participation Restrictions  Cleaning;Community Activity;Meal Prep    Stability/Clinical Decision Making  Evolving/Moderate complexity    Clinical Decision Making  Moderate    Clinical Presentation due to:  Worsening of balance and strength as time progresses    Rehab Potential  Fair    Clinical Impairments Affecting Rehab Potential  (+) highly motivated, family support (-) age, coormorbities    PT Frequency  2x / week    PT Duration  6 weeks    PT Treatment/Interventions  Electrical Stimulation;Iontophoresis 4mg /ml Dexamethasone;Aquatic Therapy;Cryotherapy;Moist Heat;Therapeutic activities;Patient/family education;Manual techniques;Balance training;Stair training;Gait training;Neuromuscular re-education;Therapeutic exercise;Passive range of motion    PT Next Visit Plan  Progress strengthening and balance    PT  Home Exercise Plan  See education section    Consulted and Agree with Plan of Care  Patient       Patient will benefit from skilled therapeutic intervention in order to improve the following deficits and impairments:  Abnormal gait, Pain, Decreased coordination, Decreased mobility, Increased muscle spasms, Postural dysfunction, Decreased endurance, Decreased range of motion, Decreased strength, Decreased balance, Difficulty walking, Decreased activity tolerance  Visit Diagnosis: Pain in right hip  Muscle weakness (generalized)     Problem List Patient Active Problem List   Diagnosis Date Noted  . Acute DVT (deep venous thrombosis) ([facility]) 04/29/2018  . 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 Atrial fibrillation ([facility])   . Chronic systolic heart failure ([facility])   . Osteoporosis, post-menopausal   . Osteoarthritis,  multiple sites     Myrene GalasWesley Annaclaire Walsworth, PT DPT 05/02/2019, 1:31 PM  [facility] [address] [place], [place], [address] Phone: [phone]   Fax:  [phone]  Name: Genia HotterGalina Powell MRN: [id number] Date of Birth: 02/11/1927

## 2019-05-08 ENCOUNTER — Ambulatory Visit: Payer: Medicare Other

## 2019-05-08 ENCOUNTER — Other Ambulatory Visit: Payer: Self-pay

## 2019-05-08 DIAGNOSIS — R262 Difficulty in walking, not elsewhere classified: Secondary | ICD-10-CM

## 2019-05-08 DIAGNOSIS — M6281 Muscle weakness (generalized): Secondary | ICD-10-CM

## 2019-05-08 DIAGNOSIS — M25551 Pain in right hip: Secondary | ICD-10-CM | POA: Diagnosis not present

## 2019-05-08 NOTE — Therapy (Signed)
[facility] [address] [place], [address] Phone: [phone]   Fax:  [phone]  Physical Therapy Treatment  Patient Details  Name: Diana Powell MRN: [id number] Date of Birth: 16-Nov-1926 Referring Provider (PT): Augusto Garbe MD   Encounter Date: 05/08/2019  PT End of Session - 05/08/19 1524    Visit Number  2    Number of Visits  16    Date for PT Re-Evaluation  06/12/19    PT Start Time  1430    PT Stop Time  1515    PT Time Calculation (min)  45 min    Equipment Utilized During Treatment  Gait belt    Activity Tolerance  Patient tolerated treatment well;Patient limited by fatigue    Behavior During Therapy  [facility] for tasks assessed/performed       Past Medical History:  Diagnosis Date  . Atrial fibrillation ([facility])   . Chronic systolic heart failure ([facility])   . COPD (chronic obstructive pulmonary disease) ([facility])   . Osteoarthritis, multiple sites   . Osteoporosis, post-menopausal   . Presence of permanent cardiac pacemaker     Past Surgical History:  Procedure Laterality Date  . CARDIOVERSION  ~2009  . HIP FRACTURE SURGERY Bilateral [phone]   each hip fractured at separate times  . INSERT / REPLACE / REMOVE PACEMAKER    . TOTAL HIP ARTHROPLASTY Right 04/20/2018   Procedure: TOTAL HIP CONVERSION;  Surgeon: Kennedy Bucker, MD;  Location: [facility] ORS;  Service: Orthopedics;  Laterality: Right;    There were no vitals filed for this visit.  Subjective Assessment - 05/08/19 1521    Subjective  Patient states no major changes since the perviouss session. Patient states she has been having increased back pain and is currently wearing an icy hot patch.   interpreted by interpreter and hx supplemented by son   Patient is accompained by:  Interpreter;Family member   Son and seperate interpreter   Pertinent History  CVA with perserved EF, self-reported 'Lung condition', R THA, DVT, wrist fx    Limitations   Lifting;Standing;Walking    Patient Stated Goals  To walk around easier    Currently in Pain?  No/denies    Pain Onset  More than a month ago       TREATMENT Therapeutic Exercise Hip ER on the R LE in sitting - x 10  Heel raises in standing - x 10 with UE support Hip abduction in standing - x 10 B Hip extension in standing - x 10 B Lumbar extension in standing with UE support - x10 Gait Training Ambulation - x360ft, x47ft with focus on improving heel strike, shortening proximity to the FWW and improving stride length  Performed exercises to improve strength and endurance with prolonged ambulation  PT Education - 05/08/19 1523    Education Details  Form/technique with exercise; heel raises in standing    Person(s) Educated  Patient    Methods  Explanation;Demonstration    Comprehension  Returned demonstration;Verbalized understanding          PT Long Term Goals - 05/02/19 1318      PT LONG TERM GOAL #1   Title  Patient will be independent with HEP to continue benefits of therapy until after discharge.     Baseline  Dependent with form/technique    Time  6    Period  Weeks    Status  New      PT LONG TERM GOAL #  2   Title  Patient will be able to ambulate over 73ft ith 47minWT to improve cardiovascular endurance and improve ability to walk in the community.    Baseline  338ft    Time  6    Period  Weeks    Status  New      PT LONG TERM GOAL #3   Title  Patient will be able to perform the TUG in under 12 sec to indicate singificant improvement gait speed and dynamic balance with use of FWW    Baseline  27sec    Time  6    Period  Weeks    Status  New      PT LONG TERM GOAL #4   Title  Patient will improve 5 x STS to under 12sec to decrease fall risk and improve LE functional strength.    Baseline  5xSTS: 27 sec    Time  6    Period  Weeks    Status  New      PT LONG TERM GOAL #5   Title  Patient will improve gait speed to > .31m/s to indicate significant  improvement with ability to community ambulate with decreased fall risk    Baseline  .37 m/s    Time  6    Period  Weeks    Status  New            Plan - 05/08/19 1539    Clinical Impression Statement  Patient demosntrates improvement with ambulation with overall quicker walking speed and ability to amb for longer periods of time without required break. Patient is improving but continues to have significant limitations with prolonged standing performance and prolonged ambulation. Patient will benefit from further skilled therapy focused on improving limitations to return to prior level of function.    Personal Factors and Comorbidities  Comorbidity 2    Comorbidities  DVT, THA, CVA, "lung disorder"    Examination-Activity Limitations  Carry;Lift;Locomotion Level;Stand;Stairs    Examination-Participation Restrictions  Cleaning;Community Activity;Meal Prep    Stability/Clinical Decision Making  Evolving/Moderate complexity    Rehab Potential  Fair    Clinical Impairments Affecting Rehab Potential  (+) highly motivated, family support (-) age, coormorbities    PT Frequency  2x / week    PT Duration  6 weeks    PT Treatment/Interventions  Electrical Stimulation;Iontophoresis 4mg /ml Dexamethasone;Aquatic Therapy;Cryotherapy;Moist Heat;Therapeutic activities;Patient/family education;Manual techniques;Balance training;Stair training;Gait training;Neuromuscular re-education;Therapeutic exercise;Passive range of motion    PT Next Visit Plan  Progress strengthening and balance    PT Home Exercise Plan  See education section    Consulted and Agree with Plan of Care  Patient       Patient will benefit from skilled therapeutic intervention in order to improve the following deficits and impairments:  Abnormal gait, Pain, Decreased coordination, Decreased mobility, Increased muscle spasms, Postural dysfunction, Decreased endurance, Decreased range of motion, Decreased strength, Decreased balance,  Difficulty walking, Decreased activity tolerance  Visit Diagnosis: Pain in right hip  Muscle weakness (generalized)  Difficulty in walking, not elsewhere classified     Problem List Patient Active Problem List   Diagnosis Date Noted  . Acute DVT (deep venous thrombosis) ([place]) 04/29/2018  . 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 Dementia due to medical condition without behavioral disturbance ([place]) 05/04/2013  . Atrial fibrillation ([place])   . Chronic systolic heart failure ([place])   . Osteoporosis, post-menopausal   . Osteoarthritis, multiple sites     [employer],  PT DPT 05/08/2019, 3:43 PM  [facility] [address] [place], [place], [address] Phone: [phone]   Fax:  [phone]  Name: Genia HotterGalina Paletta MRN: [id number] Date of Birth: 08-Apr-1927

## 2019-05-15 ENCOUNTER — Other Ambulatory Visit: Payer: Self-pay

## 2019-05-15 ENCOUNTER — Ambulatory Visit: Payer: Medicare Other

## 2019-05-15 DIAGNOSIS — M25551 Pain in right hip: Secondary | ICD-10-CM

## 2019-05-15 DIAGNOSIS — R262 Difficulty in walking, not elsewhere classified: Secondary | ICD-10-CM

## 2019-05-15 DIAGNOSIS — M6281 Muscle weakness (generalized): Secondary | ICD-10-CM

## 2019-05-15 NOTE — Therapy (Signed)
[facility] [address] [place], [address] Phone: [phone]   Fax:  [phone]  Physical Therapy Treatment  Patient Details  Name: Diana Powell MRN: [id number] Date of Birth: Nov 27, 1926 Referring Provider (PT): Augusto Garbe MD   Encounter Date: 05/15/2019  PT End of Session - 05/15/19 1455    Visit Number  3    Number of Visits  16    Date for PT Re-Evaluation  06/12/19    Authorization Type  3 /10 G code    PT Start Time  1430    PT Stop Time  1515    PT Time Calculation (min)  45 min    Equipment Utilized During Treatment  Gait belt    Activity Tolerance  Patient tolerated treatment well;Patient limited by fatigue    Behavior During Therapy  [facility] for tasks assessed/performed       Past Medical History:  Diagnosis Date  . Atrial fibrillation ([facility])   . Chronic systolic heart failure ([facility])   . COPD (chronic obstructive pulmonary disease) ([facility])   . Osteoarthritis, multiple sites   . Osteoporosis, post-menopausal   . Presence of permanent cardiac pacemaker     Past Surgical History:  Procedure Laterality Date  . CARDIOVERSION  ~2009  . HIP FRACTURE SURGERY Bilateral [phone]   each hip fractured at separate times  . INSERT / REPLACE / REMOVE PACEMAKER    . TOTAL HIP ARTHROPLASTY Right 04/20/2018   Procedure: TOTAL HIP CONVERSION;  Surgeon: Kennedy Bucker, MD;  Location: [facility] ORS;  Service: Orthopedics;  Laterality: Right;    There were no vitals filed for this visit.  Subjective Assessment - 05/15/19 1444    Subjective  Patient reports she feels greater amount of fatigue today at baseline. States her hip is bothering her slightly today during amb but not at baseline   interpreted by interpreter and hx supplemented by son   Patient is accompained by:  Interpreter;Family member   Son and seperate interpreter   Pertinent History  CVA with perserved EF, self-reported 'Lung condition', R THA, DVT,  wrist fx    Limitations  Lifting;Standing;Walking    Patient Stated Goals  To walk around easier    Currently in Pain?  No/denies    Pain Onset  More than a month ago        TREATMENT Therapeutic Exercise Hip ER in sitting B -- x 15 Hip IR in sitting with YTB - 2 x 10 Sit to stands - 2 x 10  Hopping with FWW - x 10 Hip abduction in sitting with YTB around ankles - x 10 Side stepping in standing with UE support - 8 steps x 2   Gait Training Ambulation - x470ft, x 56ft with focus on improving heel strike, shortening proximity to the FWW and improving stride length.  Performed exercises to improve strength and endurance with prolonged ambulation   PT Education - 05/15/19 1451    Education Details  form/technique with exercise    Person(s) Educated  Patient    Methods  Explanation;Demonstration    Comprehension  Verbalized understanding;Returned demonstration          PT Long Term Goals - 05/02/19 1318      PT LONG TERM GOAL #1   Title  Patient will be independent with HEP to continue benefits of therapy until after discharge.     Baseline  Dependent with form/technique    Time  6  Period  Weeks    Status  New      PT LONG TERM GOAL #2   Title  Patient will be able to ambulate over 73200ft ith 6minWT to improve cardiovascular endurance and improve ability to walk in the community.    Baseline  33875ft    Time  6    Period  Weeks    Status  New      PT LONG TERM GOAL #3   Title  Patient will be able to perform the TUG in under 12 sec to indicate singificant improvement gait speed and dynamic balance with use of FWW    Baseline  27sec    Time  6    Period  Weeks    Status  New      PT LONG TERM GOAL #4   Title  Patient will improve 5 x STS to under 12sec to decrease fall risk and improve LE functional strength.    Baseline  5xSTS: 27 sec    Time  6    Period  Weeks    Status  New      PT LONG TERM GOAL #5   Title  Patient will improve gait speed to > .3172m/s to  indicate significant improvement with ability to community ambulate with decreased fall risk    Baseline  .37 m/s    Time  6    Period  Weeks    Status  New            Plan - 05/15/19 1534    Clinical Impression Statement  Patient demonstrates improvement with ambulation quality and total ambulation amount to >53900ft indicating improvement in cardiovascular endurance. Patient is improving overall but continues to have limitations in progressive squatting and standing for walking for long periods of time. Patient will benefit from further skilled therapy to return to prior level of function.    Personal Factors and Comorbidities  Comorbidity 2    Comorbidities  DVT, THA, CVA, "lung disorder"    Examination-Activity Limitations  Carry;Lift;Locomotion Level;Stand;Stairs    Examination-Participation Restrictions  Cleaning;Community Activity;Meal Prep    Stability/Clinical Decision Making  Evolving/Moderate complexity    Rehab Potential  Fair    Clinical Impairments Affecting Rehab Potential  (+) highly motivated, family support (-) age, coormorbities    PT Frequency  2x / week    PT Duration  6 weeks    PT Treatment/Interventions  Electrical Stimulation;Iontophoresis 4mg /ml Dexamethasone;Aquatic Therapy;Cryotherapy;Moist Heat;Therapeutic activities;Patient/family education;Manual techniques;Balance training;Stair training;Gait training;Neuromuscular re-education;Therapeutic exercise;Passive range of motion    PT Next Visit Plan  Progress strengthening and balance    PT Home Exercise Plan  See education section    Consulted and Agree with Plan of Care  Patient       Patient will benefit from skilled therapeutic intervention in order to improve the following deficits and impairments:  Abnormal gait, Pain, Decreased coordination, Decreased mobility, Increased muscle spasms, Postural dysfunction, Decreased endurance, Decreased range of motion, Decreased strength, Decreased balance, Difficulty  walking, Decreased activity tolerance  Visit Diagnosis: Pain in right hip  Muscle weakness (generalized)  Difficulty in walking, not elsewhere classified     Problem List Patient Active Problem List   Diagnosis Date Noted  . Acute DVT (deep venous thrombosis) ([facility]) 04/29/2018  . 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 Atrial fibrillation ([facility])   . Chronic systolic heart failure ([facility])   .  Osteoporosis, post-menopausal   . Osteoarthritis, multiple sites     Blythe Stanford, PT DPT 05/15/2019, 3:47 PM  [facility] [address] [place], [address] Phone: [phone]   Fax:  [phone]  Name: Diana Powell MRN: [id number] Date of Birth: Jun 08, 1927

## 2019-05-22 ENCOUNTER — Other Ambulatory Visit: Payer: Self-pay

## 2019-05-22 ENCOUNTER — Ambulatory Visit: Payer: Medicare Other | Attending: Geriatric Medicine

## 2019-05-22 DIAGNOSIS — M25551 Pain in right hip: Secondary | ICD-10-CM

## 2019-05-22 DIAGNOSIS — R262 Difficulty in walking, not elsewhere classified: Secondary | ICD-10-CM | POA: Diagnosis present

## 2019-05-22 DIAGNOSIS — M6281 Muscle weakness (generalized): Secondary | ICD-10-CM | POA: Diagnosis present

## 2019-05-22 NOTE — Therapy (Addendum)
[facility] [address] [place], [address] Phone: [phone]   Fax:  [phone]  Physical Therapy Treatment/ Progress Note  Patient Details  Name: Diana Powell MRN: [id number] Date of Birth: 20-Mar-1927 Referring Provider (PT): Bloomberg MD  Reporting period: 05/01/2019 - 05/22/2019  Encounter Date: 05/22/2019  PT End of Session - 05/22/19 1458    Visit Number  4    Number of Visits  16    Date for PT Re-Evaluation  06/12/19    Authorization Type  4 /10    PT Start Time  1345    PT Stop Time  1430    PT Time Calculation (min)  45 min    Equipment Utilized During Treatment  Gait belt    Activity Tolerance  Patient tolerated treatment well;Patient limited by fatigue    Behavior During Therapy  [facility] for tasks assessed/performed       Past Medical History:  Diagnosis Date  . Atrial fibrillation (HCC)   . Chronic systolic heart failure (HCC)   . COPD (chronic obstructive pulmonary disease) (HCC)   . Osteoarthritis, multiple sites   . Osteoporosis, post-menopausal   . Presence of permanent cardiac pacemaker     Past Surgical History:  Procedure Laterality Date  . CARDIOVERSION  ~2009  . HIP FRACTURE SURGERY Bilateral [phone]   each hip fractured at separate times  . INSERT / REPLACE / REMOVE PACEMAKER    . TOTAL HIP ARTHROPLASTY Right 04/20/2018   Procedure: TOTAL HIP CONVERSION;  Surgeon: Kennedy Bucker, MD;  Location: [facility] ORS;  Service: Orthopedics;  Laterality: Right;    There were no vitals filed for this visit.  Subjective Assessment - 05/22/19 1439    Subjective  Patient reports no major changes since the previous session. Patient reports she has been performing exercises in standing and walking. Patient states she is having a "good day" today.   interpreted by interpreter and hx supplemented by son   Patient is accompained by:  Interpreter;Family member   Son and seperate interpreter   Pertinent History  CVA with perserved EF, self-reported 'Lung condition', R THA, DVT, wrist fx    Limitations  Lifting;Standing;Walking    Patient Stated Goals  To walk around easier    Currently in Pain?  No/denies    Pain Onset  More than a month ago           TREATMENT Gait training Gait training with patient utilizing a FWW with focus on improving gait speed and requiring cues to navigation of walker secondary to poor eye sight and dynamic balance difficulty -- 15 min  Therapeutic Exercise  Sit to stand -- 2 x 5 with cueing to perform with increased speed and without use of UE to maximize LE strength Seated hip IR with use of GTB with ball between legs to strengthen stabilization musculature of the hips -- 2 x 10 Hip abduction in sitting with performing knee extension at end range -- x 15 Transferring from sitting to ambulation -- 3 x 20 ft with use of FWW   Patient demonstrates improvement overall in standing; SpO2 93-95% during the entirety of the session     PT Education - 05/22/19 1458    Education Details  form/technique with exercise    Person(s) Educated  Patient    Methods  Explanation;Demonstration    Comprehension  Verbalized understanding;Returned demonstration          PT Long Term Goals -  05/22/19 1414      PT LONG TERM GOAL #1   Title  Patient will be independent with HEP to continue benefits of therapy until after discharge.     Baseline  Dependent with form/technique; Independent with form/technique    Time  6    Period  Weeks    Status  Achieved    Target Date  05/22/19      PT LONG TERM GOAL #2   Title  Patient will be able to ambulate over 753ft ith 66minWT to improve cardiovascular endurance and improve ability to walk in the community.    Baseline  374ft; 05/22/2019: 671ft    Time  6    Period  Weeks    Status  On-going    Target Date  07/03/19      PT LONG TERM GOAL #3   Title  Patient will be able to perform the TUG in under 12 sec to  indicate singificant improvement gait speed and dynamic balance with use of FWW    Baseline  27sec; 05/22/2019: 16sec    Time  6    Period  Weeks    Status  On-going    Target Date  07/03/19      PT LONG TERM GOAL #4   Title  Patient will improve 5 x STS to under 12sec to decrease fall risk and improve LE functional strength.    Baseline  5xSTS: 27 sec; 05/22/2019: 20 sec    Time  6    Period  Weeks    Status  On-going    Target Date  07/03/19      PT LONG TERM GOAL #5   Title  Patient will improve gait speed to > .47m/s to indicate significant improvement with ability to community ambulate with decreased fall risk    Baseline  .37 m/s; 05/22/2019: .69 m/s    Time  6    Period  Weeks    Status  On-going    Target Date  07/03/19            Plan - 05/22/19 1500    Clinical Impression Statement  Patient is making progress towards long term goals with improvement in all functional measurements including the TUG, 5XSTS, 94minWT, and 54mwt indicating functional improvement in dynamic balance, walking speed, and cardiovasculat endurance. Although patient is improving, she continues to have below normative value for all functional measurements assessed as well as decreased cardiovascular endurance. Patient is improving overall and well benefit from further skilled therapy to return to prior level of function.    Personal Factors and Comorbidities  Comorbidity 2    Comorbidities  DVT, THA, CVA, "lung disorder"    Examination-Activity Limitations  Carry;Lift;Locomotion Level;Stand;Stairs    Examination-Participation Restrictions  Cleaning;Community Activity;Meal Prep    Stability/Clinical Decision Making  Evolving/Moderate complexity    Rehab Potential  Fair    Clinical Impairments Affecting Rehab Potential  (+) highly motivated, family support (-) age, coormorbities    PT Frequency  2x / week    PT Duration  6 weeks    PT Treatment/Interventions  Electrical Stimulation;Iontophoresis 4mg /ml  Dexamethasone;Aquatic Therapy;Cryotherapy;Moist Heat;Therapeutic activities;Patient/family education;Manual techniques;Balance training;Stair training;Gait training;Neuromuscular re-education;Therapeutic exercise;Passive range of motion    PT Next Visit Plan  Progress strengthening and balance    PT Home Exercise Plan  See education section    Consulted and Agree with Plan of Care  Patient       Patient will benefit from skilled therapeutic intervention  in order to improve the following deficits and impairments:  Abnormal gait, Pain, Decreased coordination, Decreased mobility, Increased muscle spasms, Postural dysfunction, Decreased endurance, Decreased range of motion, Decreased strength, Decreased balance, Difficulty walking, Decreased activity tolerance  Visit Diagnosis: Pain in right hip  Muscle weakness (generalized)  Difficulty in walking, not elsewhere classified     Problem List Patient Active Problem List   Diagnosis Date Noted  . Acute DVT (deep venous thrombosis) (HCC) 04/29/2018  . 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 Atrial fibrillation (HCC)   . Chronic systolic heart failure (HCC)   . Osteoporosis, post-menopausal   . Osteoarthritis, multiple sites     Myrene GalasWesley Salman Wellen, PT DPT 05/22/2019, 3:14 PM  [place] [facility] [address] [place], [place], [address] Phone: [phone]   Fax:  [phone]  Name: Genia HotterGalina Bohlken MRN: [id number] Date of Birth: 1927/01/09

## 2019-05-29 ENCOUNTER — Ambulatory Visit: Payer: Medicare Other

## 2019-05-29 ENCOUNTER — Other Ambulatory Visit: Payer: Self-pay

## 2019-05-29 DIAGNOSIS — M6281 Muscle weakness (generalized): Secondary | ICD-10-CM

## 2019-05-29 DIAGNOSIS — M25551 Pain in right hip: Secondary | ICD-10-CM

## 2019-05-29 DIAGNOSIS — R262 Difficulty in walking, not elsewhere classified: Secondary | ICD-10-CM

## 2019-05-29 NOTE — Therapy (Signed)
[place] [facility] [address] [place], [address] Phone: [phone]   Fax:  [phone]  Physical Therapy Treatment  Patient Details  Name: Diana Powell MRN: [id number] Date of Birth: [birth year] Referring Provider (PT): Augusto Garbe MD   Encounter Date: 05/29/2019  PT End of Session - 05/29/19 1400    Visit Number  5    Number of Visits  16    Date for PT Re-Evaluation  06/12/19    Authorization Type  5/10    PT Start Time  1345    PT Stop Time  1430    PT Time Calculation (min)  45 min    Equipment Utilized During Treatment  Gait belt    Activity Tolerance  Patient tolerated treatment well;Patient limited by fatigue    Behavior During Therapy  [facility] for tasks assessed/performed       Past Medical History:  Diagnosis Date  . Atrial fibrillation (HCC)   . Chronic systolic heart failure (HCC)   . COPD (chronic obstructive pulmonary disease) (HCC)   . Osteoarthritis, multiple sites   . Osteoporosis, post-menopausal   . Presence of permanent cardiac pacemaker     Past Surgical History:  Procedure Laterality Date  . CARDIOVERSION  ~2009  . HIP FRACTURE SURGERY Bilateral [phone]   each hip fractured at separate times  . INSERT / REPLACE / REMOVE PACEMAKER    . TOTAL HIP ARTHROPLASTY Right 04/20/2018   Procedure: TOTAL HIP CONVERSION;  Surgeon: Kennedy Bucker, MD;  Location: [facility] ORS;  Service: Orthopedics;  Laterality: Right;    There were no vitals filed for this visit.  Subjective Assessment - 05/29/19 1355    Subjective  Patient reports she was tired today and has been sleeping much of the day. Patient states no major changes otherwise.   interpreted by interpreter and hx supplemented by son   Patient is accompained by:  Interpreter;Family member   Son and seperate interpreter   Pertinent History  CVA with perserved EF, self-reported 'Lung condition', R THA, DVT, wrist fx    Limitations   Lifting;Standing;Walking    Patient Stated Goals  To walk around easier    Currently in Pain?  No/denies    Pain Onset  More than a month ago         TREATMENT Gait training Gait training with patient utilizing a FWW with focus on improving gait speed and requiring cues to navigation of walker secondary to poor eye sight and dynamic balance difficulty -- 15 min Therapeutic Exercise  Sit to stand -- 2 x 10 with towel on seat with cueing to perform with increased speed and without use of UE to maximize LE strength Step ups without UE support onto 4" step -- x 10 Step ups with single finger support -- x 3 (down and back 4 steps each repetitions) 6"   Patient demonstrates improvement overall in standing; SpO2 93-97% during the entirety of the session   PT Education - 05/29/19 1400    Education Details  form/technique with exercise    Person(s) Educated  Patient    Methods  Explanation;Demonstration    Comprehension  Verbalized understanding;Returned demonstration          PT Long Term Goals - 05/22/19 1414      PT LONG TERM GOAL #1   Title  Patient will be independent with HEP to continue benefits of therapy until after discharge.     Baseline  Dependent with form/technique;  Independent with form/technique    Time  6    Period  Weeks    Status  Achieved    Target Date  05/22/19      PT LONG TERM GOAL #2   Title  Patient will be able to ambulate over 79000ft ith 6minWT to improve cardiovascular endurance and improve ability to walk in the community.    Baseline  34975ft; 05/22/2019: 64500ft    Time  6    Period  Weeks    Status  On-going    Target Date  07/03/19      PT LONG TERM GOAL #3   Title  Patient will be able to perform the TUG in under 12 sec to indicate singificant improvement gait speed and dynamic balance with use of FWW    Baseline  27sec; 05/22/2019: 16sec    Time  6    Period  Weeks    Status  On-going    Target Date  07/03/19      PT LONG TERM GOAL #4    Title  Patient will improve 5 x STS to under 12sec to decrease fall risk and improve LE functional strength.    Baseline  5xSTS: 27 sec; 05/22/2019: 20 sec    Time  6    Period  Weeks    Status  On-going    Target Date  07/03/19      PT LONG TERM GOAL #5   Title  Patient will improve gait speed to > .8713m/s to indicate significant improvement with ability to community ambulate with decreased fall risk    Baseline  .37 m/s; 05/22/2019: .69 m/s    Time  6    Period  Weeks    Status  On-going    Target Date  07/03/19            Plan - 05/29/19 1409    Clinical Impression Statement  Patient demonstrates ability to ambulate 51800ft before requiring sitting rest break indicating functional improvement in cardiovascular endurance compared to the beginning of physical therapy. Addressed stepping today to allow patient to perform step ups. Patient is improving overall but continues to have difficulty with dynamic balance most notably in positions with single leg stance. Patient will benefit from further skilled therapy to return to prior level of function.    Personal Factors and Comorbidities  Comorbidity 2    Comorbidities  DVT, THA, CVA, "lung disorder"    Examination-Activity Limitations  Carry;Lift;Locomotion Level;Stand;Stairs    Examination-Participation Restrictions  Cleaning;Community Activity;Meal Prep    Stability/Clinical Decision Making  Evolving/Moderate complexity    Rehab Potential  Fair    Clinical Impairments Affecting Rehab Potential  (+) highly motivated, family support (-) age, coormorbities    PT Frequency  2x / week    PT Duration  6 weeks    PT Treatment/Interventions  Electrical Stimulation;Iontophoresis 4mg /ml Dexamethasone;Aquatic Therapy;Cryotherapy;Moist Heat;Therapeutic activities;Patient/family education;Manual techniques;Balance training;Stair training;Gait training;Neuromuscular re-education;Therapeutic exercise;Passive range of motion    PT Next Visit Plan   Progress strengthening and balance    PT Home Exercise Plan  See education section    Consulted and Agree with Plan of Care  Patient       Patient will benefit from skilled therapeutic intervention in order to improve the following deficits and impairments:  Abnormal gait, Pain, Decreased coordination, Decreased mobility, Increased muscle spasms, Postural dysfunction, Decreased endurance, Decreased range of motion, Decreased strength, Decreased balance, Difficulty walking, Decreased activity tolerance  Visit Diagnosis: Pain in right  hip  Muscle weakness (generalized)  Difficulty in walking, not elsewhere classified     Problem List Patient Active Problem List   Diagnosis Date Noted  . Acute DVT (deep venous thrombosis) ([place]) 04/29/2018  . 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 Dementia due to medical condition without behavioral disturbance ([place]) 05/04/2013  . Atrial fibrillation ([place])   . Chronic systolic heart failure ([place])   . Osteoporosis, post-menopausal   . Osteoarthritis, multiple sites     Blythe Stanford, PT DPT 05/29/2019, 3:05 PM  [place] [facility]ORTS MEDIC[facility] [address] [place], [address] Phone: [phone]   Fax:  [phone]  Name: Borghild Thaker MRN: [id number] Date of Birth: October 14, 1926

## 2019-06-06 ENCOUNTER — Other Ambulatory Visit: Payer: Self-pay

## 2019-06-06 ENCOUNTER — Ambulatory Visit: Payer: Medicare Other

## 2019-06-06 DIAGNOSIS — M25551 Pain in right hip: Secondary | ICD-10-CM

## 2019-06-06 DIAGNOSIS — R262 Difficulty in walking, not elsewhere classified: Secondary | ICD-10-CM

## 2019-06-06 DIAGNOSIS — M6281 Muscle weakness (generalized): Secondary | ICD-10-CM

## 2019-06-06 NOTE — Therapy (Signed)
[place] [facility] [address] [place], [address] Phone: [phone]   Fax:  [phone]  Physical Therapy Treatment  Patient Details  Name: Diana Powell MRN: [id number] Date of Birth: June 14, 1927 Referring Provider (PT): Frazier Butt MD   Encounter Date: 06/06/2019  PT End of Session - 06/06/19 1615    Visit Number  6    Number of Visits  16    Date for PT Re-Evaluation  06/12/19    Authorization Type  6/10    PT Start Time  7353    PT Stop Time  1600    PT Time Calculation (min)  45 min    Equipment Utilized During Treatment  Gait belt    Activity Tolerance  Patient tolerated treatment well;Patient limited by fatigue    Behavior During Therapy  [facility] for tasks assessed/performed       Past Medical History:  Diagnosis Date  . Atrial fibrillation ([place])   . Chronic systolic heart failure ([place])   . COPD (chronic obstructive pulmonary disease) ([place])   . Osteoarthritis, multiple sites   . Osteoporosis, post-menopausal   . Presence of permanent cardiac pacemaker     Past Surgical History:  Procedure Laterality Date  . CARDIOVERSION  ~2009  . HIP FRACTURE SURGERY Bilateral [phone]   each hip fractured at separate times  . INSERT / REPLACE / REMOVE PACEMAKER    . TOTAL HIP ARTHROPLASTY Right 04/20/2018   Procedure: TOTAL HIP CONVERSION;  Surgeon: Hessie Knows, MD;  Location: [facility] ORS;  Service: Orthopedics;  Laterality: Right;    There were no vitals filed for this visit.  Subjective Assessment - 06/06/19 1614    Subjective  Patient states she is having a good day and was able to take a nap before coming into the clinic.   interpreted by interpreter and hx supplemented by son   Patient is accompained by:  Interpreter;Family member   Son and seperate interpreter   Pertinent History  CVA with perserved EF, self-reported 'Lung condition', R THA, DVT, wrist fx    Limitations  Lifting;Standing;Walking    Patient Stated Goals  To walk around easier    Currently in Pain?  No/denies    Pain Onset  More than a month ago       TREATMENT Gait training Gait training with patient utilizing a FWW with focus on improving gait speed and requiring cues to navigation of walker secondary to poor eye sight and dynamic balance difficulty -- 20 min Therapeutic Exercise  Semi-tandem stance on airex pad -- x 2 min B Step ups with unilat UE support onto 4" step -- x 10 Step ups with single finger support -- x 8 (down and back 4 steps each repetitions) 6"   Patient demonstrates improvement overall in standing; SpO2 93-97% during the entirety of the session  PT Education - 06/06/19 1615    Education Details  form/technique with exercise    Person(s) Educated  Patient    Methods  Explanation;Demonstration    Comprehension  Verbalized understanding;Returned demonstration          PT Long Term Goals - 05/22/19 1414      PT LONG TERM GOAL #1   Title  Patient will be independent with HEP to continue benefits of therapy until after discharge.     Baseline  Dependent with form/technique; Independent with form/technique    Time  6    Period  Weeks    Status  Achieved    Target Date  05/22/19      PT LONG TERM GOAL #2   Title  Patient will be able to ambulate over 75700ft ith 6minWT to improve cardiovascular endurance and improve ability to walk in the community.    Baseline  31775ft; 05/22/2019: 69400ft    Time  6    Period  Weeks    Status  On-going    Target Date  07/03/19      PT LONG TERM GOAL #3   Title  Patient will be able to perform the TUG in under 12 sec to indicate singificant improvement gait speed and dynamic balance with use of FWW    Baseline  27sec; 05/22/2019: 16sec    Time  6    Period  Weeks    Status  On-going    Target Date  07/03/19      PT LONG TERM GOAL #4   Title  Patient will improve 5 x STS to under 12sec to decrease fall risk and improve LE functional strength.     Baseline  5xSTS: 27 sec; 05/22/2019: 20 sec    Time  6    Period  Weeks    Status  On-going    Target Date  07/03/19      PT LONG TERM GOAL #5   Title  Patient will improve gait speed to > .7532m/s to indicate significant improvement with ability to community ambulate with decreased fall risk    Baseline  .37 m/s; 05/22/2019: .69 m/s    Time  6    Period  Weeks    Status  On-going    Target Date  07/03/19            Plan - 06/06/19 1618    Clinical Impression Statement  Patient demonstrates ability to ambulate 64100ft x 2 indicating improvement in muscular and cardiovascualr endurance most notably in the LEs. Patient also performs stepping with greater ease compared to the previous session with less postural lean and ability to extend the stepping LE. Patient is improving overall and will benefit from further skilled therapy to return to prior level of function.    Personal Factors and Comorbidities  Comorbidity 2    Comorbidities  DVT, THA, CVA, "lung disorder"    Examination-Activity Limitations  Carry;Lift;Locomotion Level;Stand;Stairs    Examination-Participation Restrictions  Cleaning;Community Activity;Meal Prep    Stability/Clinical Decision Making  Evolving/Moderate complexity    Rehab Potential  Fair    Clinical Impairments Affecting Rehab Potential  (+) highly motivated, family support (-) age, coormorbities    PT Frequency  2x / week    PT Duration  6 weeks    PT Treatment/Interventions  Electrical Stimulation;Iontophoresis 4mg /ml Dexamethasone;Aquatic Therapy;Cryotherapy;Moist Heat;Therapeutic activities;Patient/family education;Manual techniques;Balance training;Stair training;Gait training;Neuromuscular re-education;Therapeutic exercise;Passive range of motion    PT Next Visit Plan  Progress strengthening and balance    PT Home Exercise Plan  See education section    Consulted and Agree with Plan of Care  Patient       Patient will benefit from skilled therapeutic  intervention in order to improve the following deficits and impairments:  Abnormal gait, Pain, Decreased coordination, Decreased mobility, Increased muscle spasms, Postural dysfunction, Decreased endurance, Decreased range of motion, Decreased strength, Decreased balance, Difficulty walking, Decreased activity tolerance  Visit Diagnosis: Pain in right hip  Muscle weakness (generalized)  Difficulty in walking, not elsewhere classified     Problem List Patient Active Problem List   Diagnosis Date Noted  .  Acute DVT (deep venous thrombosis) (HCC) 04/29/2018  . 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 Atrial fibrillation (HCC)   . Chronic systolic heart failure (HCC)   . Osteoporosis, post-menopausal   . Osteoarthritis, multiple sites     [name], PT DPT 06/06/2019, 4:24 PM  [place] [facility] PHYSICAL AND SPORTS MEDICINE [address] [place], [address] Phone: [phone]   Fax:  [phone]  Name: Diana Powell MRN: [id number] Date of Birth: April 11, 1927

## 2019-06-12 ENCOUNTER — Other Ambulatory Visit: Payer: Self-pay

## 2019-06-12 ENCOUNTER — Ambulatory Visit: Payer: Medicare Other

## 2019-06-12 DIAGNOSIS — M25551 Pain in right hip: Secondary | ICD-10-CM

## 2019-06-12 DIAGNOSIS — M6281 Muscle weakness (generalized): Secondary | ICD-10-CM

## 2019-06-12 DIAGNOSIS — R262 Difficulty in walking, not elsewhere classified: Secondary | ICD-10-CM

## 2019-06-12 NOTE — Therapy (Signed)
[place] [facility] [address], [place], [address] Phone: [phone]   Fax:  [phone]  Physical Therapy Treatment  Patient Details  Name: Diana Powell MRN: [id number] Date of Birth: 1927-06-05 Referring Provider (PT): Frazier Butt MD   Encounter Date: 06/12/2019  PT End of Session - 06/12/19 1427    Visit Number  7    Number of Visits  16    Date for PT Re-Evaluation  06/12/19    Authorization Type  7/10    PT Start Time  1300    PT Stop Time  1345    PT Time Calculation (min)  45 min    Equipment Utilized During Treatment  Gait belt    Activity Tolerance  Patient tolerated treatment well;Patient limited by fatigue    Behavior During Therapy  [facility] for tasks assessed/performed       Past Medical History:  Diagnosis Date  . Atrial fibrillation ([place])   . Chronic systolic heart failure ([place])   . COPD (chronic obstructive pulmonary disease) ([place])   . Osteoarthritis, multiple sites   . Osteoporosis, post-menopausal   . Presence of permanent cardiac pacemaker     Past Surgical History:  Procedure Laterality Date  . CARDIOVERSION  ~2009  . HIP FRACTURE SURGERY Bilateral [phone]   each hip fractured at separate times  . INSERT / REPLACE / REMOVE PACEMAKER    . TOTAL HIP ARTHROPLASTY Right 04/20/2018   Procedure: TOTAL HIP CONVERSION;  Surgeon: Hessie Knows, MD;  Location: [facility] ORS;  Service: Orthopedics;  Laterality: Right;    There were no vitals filed for this visit.  Subjective Assessment - 06/12/19 1408    Subjective  Patient report she is improving overall however she continues to have increased hip pain with prolonged weight bearing.   interpreted by interpreter and hx supplemented by son   Patient is accompained by:  Interpreter;Family member   Son and seperate interpreter   Pertinent History  CVA with perserved EF, self-reported 'Lung condition', R THA, DVT, wrist fx    Limitations   Lifting;Standing;Walking    Patient Stated Goals  To walk around easier    Currently in Pain?  No/denies    Pain Onset  More than a month ago       TREATMENT Gait training Gait training with patient utilizing a FWW with focus on improving gait speed and requiring cues to navigation of walker secondary to poor eye sight and dynamic balance difficulty -- 20 min  Therapeutic Exercise  Sit to stands with towel on chair no UE support to improve leg strength - x 15 Side stepping with performing improvement in stride length - x 20 Standing marches with UE support - x 20  Side stepping over hurdles - x 25 Ambulation with cane with focus on performing with reciprocal gait pattern - x 20    Patient demonstrates improvement overall in standing; SpO2 93-98% during the entirety of the session  PT Education - 06/12/19 1427    Education Details  form/technique withe exercise    Person(s) Educated  Patient    Methods  Explanation;Demonstration    Comprehension  Verbalized understanding;Returned demonstration          PT Long Term Goals - 05/22/19 1414      PT LONG TERM GOAL #1   Title  Patient will be independent with HEP to continue benefits of therapy until after discharge.     Baseline  Dependent with form/technique;  Independent with form/technique    Time  6    Period  Weeks    Status  Achieved    Target Date  05/22/19      PT LONG TERM GOAL #2   Title  Patient will be able to ambulate over 780ft ith to improve cardiovascular endurance and improve ability to walk in the community.    Baseline  380ft; 05/22/2019: 665ft    Time  6    Period  Weeks    Status  On-going    Target Date  07/03/19      PT LONG TERM GOAL #3   Title  Patient will be able to perform the TUG in under 12 sec to indicate singificant improvement gait speed and dynamic balance with use of FWW    Baseline  27sec; 05/22/2019: 16sec    Time  6    Period  Weeks    Status  On-going    Target Date  07/03/19       PT LONG TERM GOAL #4   Title  Patient will improve 5 x STS to under 12sec to decrease fall risk and improve LE functional strength.    Baseline  5xSTS: 27 sec; 05/22/2019: 20 sec    Time  6    Period  Weeks    Status  On-going    Target Date  07/03/19      PT LONG TERM GOAL #5   Title  Patient will improve gait speed to > .16m/s to indicate significant improvement with ability to community ambulate with decreased fall risk    Baseline  .37 m/s; 05/22/2019: .69 m/s    Time  6    Period  Weeks    Status  On-going    Target Date  07/03/19            Plan - 06/12/19 1429    Clinical Impression Statement  Patient demonstrates further improvement with ambulation ability with ability to walk 1025ft and 734ft respectively which has improved considerably since the beginning of therapy and the previous session. Patient demonstrates difficulty with single leg stance and has difficulty with performance of marches in standing and prolonged walking with less UE support. Patient is improving overall and will benefit from further skilled therapy to return to prior level of function.    Personal Factors and Comorbidities  Comorbidity 2    Comorbidities  DVT, THA, CVA, "lung disorder"    Examination-Activity Limitations  Carry;Lift;Locomotion Level;Stand;Stairs    Examination-Participation Restrictions  Cleaning;Community Activity;Meal Prep    Stability/Clinical Decision Making  Evolving/Moderate complexity    Rehab Potential  Fair    Clinical Impairments Affecting Rehab Potential  (+) highly motivated, family support (-) age, coormorbities    PT Frequency  2x / week    PT Duration  6 weeks    PT Treatment/Interventions  Electrical Stimulation;Iontophoresis 4mg /ml Dexamethasone;Aquatic Therapy;Cryotherapy;Moist Heat;Therapeutic activities;Patient/family education;Manual techniques;Balance training;Stair training;Gait training;Neuromuscular re-education;Therapeutic exercise;Passive range of motion     PT Next Visit Plan  Progress strengthening and balance    PT Home Exercise Plan  See education section    Consulted and Agree with Plan of Care  Patient       Patient will benefit from skilled therapeutic intervention in order to improve the following deficits and impairments:  Abnormal gait, Pain, Decreased coordination, Decreased mobility, Increased muscle spasms, Postural dysfunction, Decreased endurance, Decreased range of motion, Decreased strength, Decreased balance, Difficulty walking, Decreased activity tolerance  Visit Diagnosis: Pain in  right hip  Muscle weakness (generalized)  Difficulty in walking, not elsewhere classified     Problem List Patient Active Problem List   Diagnosis Date Noted  . Acute DVT (deep venous thrombosis) (H[facility]) 04/29/2018  . 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 Atrial fibrillation ([facility])   . Chronic systolic heart failure (H[facility])   . Osteoporosis, post-menopausal   . Osteoarthritis, multiple sites     Myrene GalasWesley Laksh Hinners 06/12/2019, 2:44 PM  [facility] [address] [place], [place], [address] Phone: [phone]   Fax:  [phone]  Name: Genia HotterGalina Powell MRN: [id number] Date of Birth: 1926/08/30

## 2019-06-19 ENCOUNTER — Ambulatory Visit: Payer: Medicare Other | Attending: Geriatric Medicine

## 2019-06-19 ENCOUNTER — Other Ambulatory Visit: Payer: Self-pay

## 2019-06-19 DIAGNOSIS — M6281 Muscle weakness (generalized): Secondary | ICD-10-CM | POA: Diagnosis present

## 2019-06-19 DIAGNOSIS — R262 Difficulty in walking, not elsewhere classified: Secondary | ICD-10-CM | POA: Diagnosis present

## 2019-06-19 DIAGNOSIS — M25551 Pain in right hip: Secondary | ICD-10-CM | POA: Diagnosis not present

## 2019-06-20 NOTE — Therapy (Signed)
[facility] [address] [place], [place], [address] Phone: [phone]   Fax:  [phone]  Physical Therapy Treatment/ Progress Note  Patient Details  Name: Diana Powell MRN: [id number] Date of Birth: 06/08/1927 Referring Provider (PT): Bloomberg MD  Reporting period: 05/22/2019 - 06/19/2019  Encounter Date: 06/19/2019  PT End of Session - 06/19/19 1441    Visit Number  8    Number of Visits  16    Date for PT Re-Evaluation  07/17/19    Authorization Type  8/10    PT Start Time  1345    PT Stop Time  1430    PT Time Calculation (min)  45 min    Equipment Utilized During Treatment  Gait belt    Activity Tolerance  Patient tolerated treatment well;Patient limited by fatigue    Behavior During Therapy  [facility] for tasks assessed/performed       Past Medical History:  Diagnosis Date  . Atrial fibrillation (HCC)   . Chronic systolic heart failure (H[facility])   . COPD (chronic obstructive pulmonary disease) (H[facility])   . Osteoarthritis, multiple sites   . Osteoporosis, post-menopausal   . Presence of permanent cardiac pacemaker     Past Surgical History:  Procedure Laterality Date  . CARDIOVERSION  ~2009  . HIP FRACTURE SURGERY Bilateral [phone]   each hip fractured at separate times  . INSERT / REPLACE / REMOVE PACEMAKER    . TOTAL HIP ARTHROPLASTY Right 04/20/2018   Procedure: TOTAL HIP CONVERSION;  Surgeon: Kennedy BuckerMenz, Michael, MD;  Location: [facility] ORS;  Service: Orthopedics;  Laterality: Right;    There were no vitals filed for this visit.  Subjective Assessment - 06/20/19 0935    Subjective  Patient reports she is feeling more tired than normal today. Patient states increased fatigue after performing baking in the morning.   interpreted by interpreter and hx supplemented by son   Patient is accompained by:  Interpreter;Family member   Son and seperate interpreter   Pertinent History  CVA with perserved EF,  self-reported 'Lung condition', R THA, DVT, wrist fx    Limitations  Lifting;Standing;Walking    Patient Stated Goals  To walk around easier    Currently in Pain?  No/denies    Pain Onset  More than a month ago          TREATMENT Gait training Gait training with patient utilizing a FWW with focus on improving gait speed and requiring cues to navigation of walker secondary to poor eye sight and dynamic balance difficulty -- 20 min   Therapeutic Exercise  Sit to stand - 2 x 5 without UE support performed at maximum speed  Walking with turning - x 10 with and without use of AD FWW Scapular retraction in sitting - 4 x 10 Performed ambulation without use of AD to address coordination - x 10   Patient demonstrates improvement overall in standing; SpO2 93-99% during the entirety of the session  Therapeutic exercise performed to improve pain and spasms as well as address leg strength and balance   PT Education - 06/19/19 1441    Education Details  form/technique with exercise; POC    Person(s) Educated  Patient    Methods  Explanation;Demonstration    Comprehension  Verbalized understanding;Returned demonstration          PT Long Term Goals - 06/19/19 1402      PT LONG TERM GOAL #1   Title  Patient will  be independent with HEP to continue benefits of therapy until after discharge.     Baseline  Dependent with form/technique; Independent with form/technique    Time  6    Period  Weeks    Status  Achieved      PT LONG TERM GOAL #2   Title  Patient will be able to ambulate over 762ft ith to improve cardiovascular endurance and improve ability to walk in the community.    Baseline  39ft; 05/22/2019: 650ft; 06/19/2019: 564ft    Time  6    Period  Weeks    Status  On-going      PT LONG TERM GOAL #3   Title  Patient will be able to perform the TUG in under 12 sec to indicate singificant improvement gait speed and dynamic balance with use of FWW    Baseline  27sec;  05/22/2019: 16sec; 06/19/2019: 15.5sec    Time  6    Period  Weeks    Status  On-going      PT LONG TERM GOAL #4   Title  Patient will improve 5 x STS to under 12sec to decrease fall risk and improve LE functional strength.    Baseline  5xSTS: 27 sec; 05/22/2019: 20 sec; 06/19/2019: 20sec    Time  6    Period  Weeks    Status  On-going      PT LONG TERM GOAL #5   Title  Patient will improve gait speed to > .72m/s to indicate significant improvement with ability to community ambulate with decreased fall risk    Baseline  .37 m/s; 05/22/2019: .69 m/s; 06/19/2019:  .73 m/s    Time  6    Period  Weeks    Status  On-going            Plan - 06/19/19 1445    Clinical Impression Statement  Patient is making progress towards long term goals with ability to perform exercises at a greater gait speed compared to to the previous progress note, indicating overall improvement with fall risk and safer household ambulation. Patient had relatively unchanged other functional measures this is most likely secondary to increased fatigue from previously performed activities today as she was able to amb >1041ft the previous visit. Patient is improving overall but continues to have difficulty with walking long distances and dynamic balance. Patient will benefit from further skilled therapy to return to prior level of function.    Personal Factors and Comorbidities  Comorbidity 2    Comorbidities  DVT, THA, CVA, "lung disorder"    Examination-Activity Limitations  Carry;Lift;Locomotion Level;Stand;Stairs    Examination-Participation Restrictions  Cleaning;Community Activity;Meal Prep    Stability/Clinical Decision Making  Evolving/Moderate complexity    Rehab Potential  Fair    Clinical Impairments Affecting Rehab Potential  (+) highly motivated, family support (-) age, coormorbities    PT Frequency  2x / week    PT Duration  6 weeks    PT Treatment/Interventions  Electrical Stimulation;Iontophoresis 4mg /ml  Dexamethasone;Aquatic Therapy;Cryotherapy;Moist Heat;Therapeutic activities;Patient/family education;Manual techniques;Balance training;Stair training;Gait training;Neuromuscular re-education;Therapeutic exercise;Passive range of motion    PT Next Visit Plan  Progress strengthening and balance    PT Home Exercise Plan  See education section    Consulted and Agree with Plan of Care  Patient       Patient will benefit from skilled therapeutic intervention in order to improve the following deficits and impairments:  Abnormal gait, Pain, Decreased coordination, Decreased mobility, Increased muscle spasms,  Postural dysfunction, Decreased endurance, Decreased range of motion, Decreased strength, Decreased balance, Difficulty walking, Decreased activity tolerance  Visit Diagnosis: Pain in right hip  Muscle weakness (generalized)  Difficulty in walking, not elsewhere classified     Problem List Patient Active Problem List   Diagnosis Date Noted  . Acute DVT (deep venous thrombosis) ([place]) 04/29/2018  . 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 Dementia due to medical condition without behavioral disturbance ([place]) 05/04/2013  . Atrial fibrillation ([place])   . Chronic systolic heart failure ([place])   . Osteoporosis, post-menopausal   . Osteoarthritis, multiple sites     Blythe Stanford, PT DPT 06/20/2019, 9:42 AM  [place] [facility] [address] [place], [address] Phone: [phone]   Fax:  [phone]  Name: Diana Powell MRN: [id number] Date of Birth: [birth year]

## 2019-06-27 ENCOUNTER — Ambulatory Visit: Payer: Medicare Other

## 2019-06-27 ENCOUNTER — Other Ambulatory Visit: Payer: Self-pay

## 2019-06-27 DIAGNOSIS — M25551 Pain in right hip: Secondary | ICD-10-CM | POA: Diagnosis not present

## 2019-06-27 DIAGNOSIS — R262 Difficulty in walking, not elsewhere classified: Secondary | ICD-10-CM

## 2019-06-27 DIAGNOSIS — M6281 Muscle weakness (generalized): Secondary | ICD-10-CM

## 2019-06-27 NOTE — Therapy (Signed)
[place] [facility] [address] [place], [address] Phone: [phone]   Fax:  [phone]  Physical Therapy Treatment  Patient Details  Name: Diana Powell MRN: [id number] Date of Birth: [birth year] Referring Provider (PT): Augusto Garbe MD   Encounter Date: 06/27/2019  PT End of Session - 06/27/19 1538    Visit Number  9    Number of Visits  16    Date for PT Re-Evaluation  07/17/19    Authorization Type  9/10    PT Start Time  1515    PT Stop Time  1600    PT Time Calculation (min)  45 min    Equipment Utilized During Treatment  Gait belt    Activity Tolerance  Patient tolerated treatment well;Patient limited by fatigue    Behavior During Therapy  [facility] for tasks assessed/performed       Past Medical History:  Diagnosis Date  . Atrial fibrillation (H[facility])   . Chronic systolic heart failure (H[facility])   . COPD (chronic obstructive pulmonary disease) (H[facility])   . Osteoarthritis, multiple sites   . Osteoporosis, post-menopausal   . Presence of permanent cardiac pacemaker     Past Surgical History:  Procedure Laterality Date  . CARDIOVERSION  ~2009  . HIP FRACTURE SURGERY Bilateral [phone]   each hip fractured at separate times  . INSERT / REPLACE / REMOVE PACEMAKER    . TOTAL HIP ARTHROPLASTY Right 04/20/2018   Procedure: TOTAL HIP CONVERSION;  Surgeon: Kennedy Bucker, MD;  Location: [facility] ORS;  Service: Orthopedics;  Laterality: Right;    There were no vitals filed for this visit.  Subjective Assessment - 06/27/19 1531    Subjective  Patient states she is feeling better than she did on the previous session. Patient states she had been sleeping before she came into the session today.   interpreted by interpreter and hx supplemented by son   Patient is accompained by:  Interpreter;Family member   Son and seperate interpreter   Pertinent History  CVA with perserved EF, self-reported 'Lung condition', R THA, DVT, wrist  fx    Limitations  Lifting;Standing;Walking    Patient Stated Goals  To walk around easier    Currently in Pain?  No/denies    Pain Onset  More than a month ago        TREATMENT Gait training Gait training with patient utilizing a FWW with focus on improving gait speed and requiring cues to navigation of walker secondary to poor eye sight and dynamic balance difficulty -- 21 min   Therapeutic Exercise  Sit to stands with towel on chair no UE support to improve leg strength - 2 x 10 Standing marches with UE support - x 20  Side steppingAmbulation with cane with focus on performing with reciprocal gait pattern - x 12 B steps Hip abduction in standing with UE support - x 20    Patient demonstrates improvement overall in standing; SpO2 93-98% during the entirety of the session  PT Education - 06/27/19 1536    Education Details  form/technique with exercise; reviewed HEP    Person(s) Educated  Patient    Methods  Explanation;Demonstration    Comprehension  Verbalized understanding;Returned demonstration          PT Long Term Goals - 06/19/19 1402      PT LONG TERM GOAL #1   Title  Patient will be independent with HEP to continue benefits of therapy until after discharge.  Baseline  Dependent with form/technique; Independent with form/technique    Time  6    Period  Weeks    Status  Achieved      PT LONG TERM GOAL #2   Title  Patient will be able to ambulate over 78200ft ith 6minWT to improve cardiovascular endurance and improve ability to walk in the community.    Baseline  35775ft; 05/22/2019: 64800ft; 06/19/2019: 54500ft    Time  6    Period  Weeks    Status  On-going      PT LONG TERM GOAL #3   Title  Patient will be able to perform the TUG in under 12 sec to indicate singificant improvement gait speed and dynamic balance with use of FWW    Baseline  27sec; 05/22/2019: 16sec; 06/19/2019: 15.5sec    Time  6    Period  Weeks    Status  On-going      PT LONG TERM GOAL #4    Title  Patient will improve 5 x STS to under 12sec to decrease fall risk and improve LE functional strength.    Baseline  5xSTS: 27 sec; 05/22/2019: 20 sec; 06/19/2019: 20sec    Time  6    Period  Weeks    Status  On-going      PT LONG TERM GOAL #5   Title  Patient will improve gait speed to > .1525m/s to indicate significant improvement with ability to community ambulate with decreased fall risk    Baseline  .37 m/s; 05/22/2019: .69 m/s; 06/19/2019:  .73 m/s    Time  6    Period  Weeks    Status  On-going            Plan - 06/27/19 1539    Clinical Impression Statement  Continued to focus on improving gait in terms of speed and steppage pattern during today's session. Patient demonstrates difficulty to performing exercises in standing requiring frequent sitting breaks throuhgout the session. Patient demonstrates improvement with endurance overall and will benefit from further skiled therapy to return to prior level of function.    Personal Factors and Comorbidities  Comorbidity 2    Comorbidities  DVT, THA, CVA, "lung disorder"    Examination-Activity Limitations  Carry;Lift;Locomotion Level;Stand;Stairs    Examination-Participation Restrictions  Cleaning;Community Activity;Meal Prep    Stability/Clinical Decision Making  Evolving/Moderate complexity    Rehab Potential  Fair    Clinical Impairments Affecting Rehab Potential  (+) highly motivated, family support (-) age, coormorbities    PT Frequency  2x / week    PT Duration  6 weeks    PT Treatment/Interventions  Electrical Stimulation;Iontophoresis 4mg /ml Dexamethasone;Aquatic Therapy;Cryotherapy;Moist Heat;Therapeutic activities;Patient/family education;Manual techniques;Balance training;Stair training;Gait training;Neuromuscular re-education;Therapeutic exercise;Passive range of motion    PT Next Visit Plan  Progress strengthening and balance    PT Home Exercise Plan  See education section    Consulted and Agree with Plan of Care   Patient       Patient will benefit from skilled therapeutic intervention in order to improve the following deficits and impairments:  Abnormal gait, Pain, Decreased coordination, Decreased mobility, Increased muscle spasms, Postural dysfunction, Decreased endurance, Decreased range of motion, Decreased strength, Decreased balance, Difficulty walking, Decreased activity tolerance  Visit Diagnosis: Pain in right hip  Muscle weakness (generalized)  Difficulty in walking, not elsewhere classified     Problem List Patient Active Problem List   Diagnosis Date Noted  . Acute DVT (deep venous thrombosis) (HCC) 04/29/2018  .  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 Dementia due to medical condition without behavioral disturbance ([place]) 05/04/2013  . Atrial fibrillation ([place])   . Chronic systolic heart failure ([place])   . Osteoporosis, post-menopausal   . Osteoarthritis, multiple sites     Blythe Stanford, PT DPT 06/27/2019, 3:48 PM  [place] [facility] [address], [place], [address] Phone: [phone]   Fax:  [phone]  Name: Diana Powell MRN: [id number] Date of Birth: December 19, 1926

## 2019-07-03 ENCOUNTER — Other Ambulatory Visit: Payer: Self-pay

## 2019-07-03 ENCOUNTER — Ambulatory Visit: Payer: Medicare Other

## 2019-07-03 DIAGNOSIS — R262 Difficulty in walking, not elsewhere classified: Secondary | ICD-10-CM

## 2019-07-03 DIAGNOSIS — M25551 Pain in right hip: Secondary | ICD-10-CM

## 2019-07-03 DIAGNOSIS — M6281 Muscle weakness (generalized): Secondary | ICD-10-CM

## 2019-07-03 NOTE — Therapy (Addendum)
[facility] AND [facility] MEDICINE [address] [place], [place], [address] Phone: [phone]   Fax:  [phone]  Physical Therapy Treatment/ Progress Note  Patient Details  Name: Diana Powell MRN: [id number] Date of Birth: 1926-11-07 Referring Provider (PT): Bloomberg MD  Reporting period: 05/22/2019 - 07/04/2019  Encounter Date: 07/03/2019  PT End of Session - 07/03/19 1614    Visit Number  10    Number of Visits  16    Date for PT Re-Evaluation  7/56/431/12/21 (new cert 32/9/51-8/84/1610/08/07-1/12/21)   Authorization Type  1/10   PT Start Time  1615    PT Stop Time  1700    PT Time Calculation (min)  45 min    Equipment Utilized During Treatment  Gait belt    Activity Tolerance  Patient tolerated treatment well;Patient limited by fatigue    Behavior During Therapy  [facility] for tasks assessed/performed       Past Medical History:  Diagnosis Date  . Atrial fibrillation (HCC)   . Chronic systolic heart failure (HCC)   . COPD (chronic obstructive pulmonary disease) (HCC)   . Osteoarthritis, multiple sites   . Osteoporosis, post-menopausal   . Presence of permanent cardiac pacemaker     Past Surgical History:  Procedure Laterality Date  . CARDIOVERSION  ~2009  . HIP FRACTURE SURGERY Bilateral [phone]   each hip fractured at separate times  . INSERT / REPLACE / REMOVE PACEMAKER    . TOTAL HIP ARTHROPLASTY Right 04/20/2018   Procedure: TOTAL HIP CONVERSION;  Surgeon: Kennedy BuckerMenz, Michael, MD;  Location: [facility] ORS;  Service: Orthopedics;  Laterality: Right;    There were no vitals filed for this visit.  Subjective Assessment - 07/03/19 1617    Subjective  Patient reports that she is feeling four stars out of five.   interpreted by interpreter and hx supplemented by son   Patient is accompained by:  Interpreter;Family member   Son and seperate interpreter   Pertinent History  CVA with perserved EF, self-reported 'Lung condition', R THA, DVT, wrist fx     Limitations  Lifting;Standing;Walking    Patient Stated Goals  To walk around easier    Currently in Pain?  No/denies    Pain Onset  More than a month ago      TREATMENT  GT Gait training with patient utilizing a FWW with focus on improving gait speed and requiring cues to navigation of walker secondary to poor eye sight and dynamic balance difficulty -- 25 min  Pre-ex O2 97 Pulse 72  Ambulation 600 ft in 5:24 O2 94 Pulse 75  Amb 1000 ft in 10:55 O2 97 Pulse 79  TE Slider hip extension x 10 R/L SLS 3 sec hold x 8 R/L STS x 10  TE for gluteal strengthening for improved balance and extension power with gait    PT Education - 07/03/19 1613    Education Details  form/technique with exercise    Person(s) Educated  Patient    Methods  Explanation;Demonstration    Comprehension  Verbalized understanding;Returned demonstration          PT Long Term Goals - 07/04/19 1040      PT LONG TERM GOAL #1   Title  Patient will be independent with HEP to continue benefits of therapy until after discharge.     Baseline  Dependent with form/technique; Independent with form/technique    Time  6    Period  Weeks    Status  Achieved      PT LONG TERM GOAL #2   Title  Patient will be able to ambulate over 781ft ith 16minWT to improve cardiovascular endurance and improve ability to walk in the community.    Baseline  359ft; 05/22/2019: 657ft; 06/19/2019: 584ft    Time  6    Period  Weeks    Status  On-going      PT LONG TERM GOAL #3   Title  Patient will be able to perform the TUG in under 12 sec to indicate singificant improvement gait speed and dynamic balance with use of FWW    Baseline  27sec; 05/22/2019: 16sec; 06/19/2019: 15.5sec    Time  6    Period  Weeks    Status  On-going      PT LONG TERM GOAL #4   Title  Patient will improve 5 x STS to under 12sec to decrease fall risk and improve LE functional strength.    Baseline  5xSTS: 27 sec; 05/22/2019: 20 sec; 06/19/2019:  20sec    Time  6    Period  Weeks    Status  On-going      PT LONG TERM GOAL #5   Title  Patient will improve gait speed to > .62m/s to indicate significant improvement with ability to community ambulate with decreased fall risk    Baseline  .37 m/s; 05/22/2019: .69 m/s; 06/19/2019:  .73 m/s    Time  6    Period  Weeks    Status  On-going            Plan - 07/04/19 1015    Clinical Impression Statement  Focus today on endurance with multiple gait trials prior to power- and balance-based therex. Patient demonstrated good endurance walking a combined 16 minutes with one rest break and vitals stable throughout. Exercises to improve extension power and SLS stability for increased excursion and efficiency with gait, tolerated well; plan to progress balance therex to reduce UE assist and reliance on FWW. Patient will benefit from skilled physical therapy to improve cardiovascular endurance and reduce fall risk.    Personal Factors and Comorbidities  Comorbidity 2    Comorbidities  DVT, THA, CVA, "lung disorder"    Examination-Activity Limitations  Carry;Lift;Locomotion Level;Stand;Stairs    Examination-Participation Restrictions  Cleaning;Community Activity;Meal Prep    Stability/Clinical Decision Making  Evolving/Moderate complexity    Rehab Potential  Fair    Clinical Impairments Affecting Rehab Potential  (+) highly motivated, family support (-) age, coormorbities    PT Frequency  2x / week    PT Duration  6 weeks    PT Treatment/Interventions  Electrical Stimulation;Iontophoresis 4mg /ml Dexamethasone;Aquatic Therapy;Cryotherapy;Moist Heat;Therapeutic activities;Patient/family education;Manual techniques;Balance training;Stair training;Gait training;Neuromuscular re-education;Therapeutic exercise;Passive range of motion    PT Next Visit Plan  Progress strengthening and balance    PT Home Exercise Plan  See education section    Consulted and Agree with Plan of Care  Patient        Patient will benefit from skilled therapeutic intervention in order to improve the following deficits and impairments:  Abnormal gait, Pain, Decreased coordination, Decreased mobility, Increased muscle spasms, Postural dysfunction, Decreased endurance, Decreased range of motion, Decreased strength, Decreased balance, Difficulty walking, Decreased activity tolerance  Visit Diagnosis: Pain in right hip  Muscle weakness (generalized)  Difficulty in walking, not elsewhere classified     Problem List Patient Active Problem List   Diagnosis Date Noted  . Acute DVT (deep venous thrombosis) ([place]) 04/29/2018  .  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 Atrial fibrillation (HCC)   . Chronic systolic heart failure (HCC)   . Osteoporosis, post-menopausal   . Osteoarthritis, multiple sites     Diana Powell, SPT 07/04/2019, 10:41 AM  [facility] [address] [place], [address] Phone: [phone]   Fax:  [phone]  Name: Diana Powell MRN: [id number] Date of Birth: [birth year]    Addendum on 07/18/19 to update/correct visit information, certification dates.  10:45 AM, 07/18/19 Diana Powell, PT, DPT Physical Therapist - [place] [phone] (Office)

## 2019-07-18 ENCOUNTER — Other Ambulatory Visit: Payer: Self-pay

## 2019-07-18 ENCOUNTER — Ambulatory Visit: Payer: Medicare Other

## 2019-07-18 DIAGNOSIS — R262 Difficulty in walking, not elsewhere classified: Secondary | ICD-10-CM

## 2019-07-18 DIAGNOSIS — M25551 Pain in right hip: Secondary | ICD-10-CM

## 2019-07-18 DIAGNOSIS — M6281 Muscle weakness (generalized): Secondary | ICD-10-CM

## 2019-07-18 NOTE — Therapy (Signed)
[place] PHYSICAL AND SPORTS MEDICINE [address], [place], [address] Phone: [phone]   Fax:  [phone]  Physical Therapy Treatment  Patient Details  Name: Diana Powell MRN: [id number] Date of Birth: Sep 23, 1926 Referring Provider (PT): Frazier Butt MD   Encounter Date: 07/18/2019  PT End of Session - 07/18/19 1557    Visit Number  11    Number of Visits  16    Date for PT Re-Evaluation  07/30/18    Authorization Type  2/10    Authorization Time Period  06/19/19-07/31/19    PT Start Time  1518    PT Stop Time  1557    PT Time Calculation (min)  39 min    Equipment Utilized During Treatment  Gait belt    Activity Tolerance  Patient tolerated treatment well;Patient limited by fatigue    Behavior During Therapy  [facility] for tasks assessed/performed       Past Medical History:  Diagnosis Date  . Atrial fibrillation ([place])   . Chronic systolic heart failure ([place])   . COPD (chronic obstructive pulmonary disease) ([place])   . Osteoarthritis, multiple sites   . Osteoporosis, post-menopausal   . Presence of permanent cardiac pacemaker     Past Surgical History:  Procedure Laterality Date  . CARDIOVERSION  ~2009  . HIP FRACTURE SURGERY Bilateral [phone]   each hip fractured at separate times  . INSERT / REPLACE / REMOVE PACEMAKER    . TOTAL HIP ARTHROPLASTY Right 04/20/2018   Procedure: TOTAL HIP CONVERSION;  Surgeon: Hessie Knows, MD;  Location: [facility] ORS;  Service: Orthopedics;  Laterality: Right;    There were no vitals filed for this visit.  Subjective Assessment - 07/18/19 1519    Subjective  Patient reports feeling tired, influenced by weather   interpreted by interpreter and hx supplemented by son   Patient is accompained by:  Family member   Son interprets   Pertinent History  CVA with perserved EF, self-reported 'Lung condition', R THA, DVT, wrist fx    Limitations  Lifting;Standing;Walking    Patient Stated Goals  To  walk around easier    Currently in Pain?  No/denies    Pain Onset  More than a month ago         TREATMENT  GT Gait training with patient utilizing a FWW with focus on improving gait speed and requiring cues to navigation of walker secondary to poor eye sight and dynamic balance difficulty --25 min  Pre-ex O2 98 Pulse 70  Ambulation 600 ft in 5:00 O2 97 Pulse 72  STS x 10 Hurdles 10 ft x 2 forward SLS with 3 sec hold x 5 Step ups x 6 alternating R/L Step ups onto RLE with faded UE assist 2 x 10   Amb 400 ft in 4:32 with cues for increased stride length O2 98 Pulse 82  TE for gluteal strengthening for improved balance and extension power with gait   PT Education - 07/18/19 1853    Education Details  form/technique with exercise    Person(s) Educated  Patient    Methods  Explanation;Demonstration;Verbal cues    Comprehension  Verbalized understanding;Returned demonstration;Verbal cues required          PT Long Term Goals - 07/04/19 1040      PT LONG TERM GOAL #1   Title  Patient will be independent with HEP to continue benefits of therapy until after discharge.     Baseline  Dependent with  form/technique; Independent with form/technique    Time  6    Period  Weeks    Status  Achieved      PT LONG TERM GOAL #2   Title  Patient will be able to ambulate over 72100ft ith 6minWT to improve cardiovascular endurance and improve ability to walk in the community.    Baseline  36375ft; 05/22/2019: 65000ft; 06/19/2019: 51400ft    Time  6    Period  Weeks    Status  On-going      PT LONG TERM GOAL #3   Title  Patient will be able to perform the TUG in under 12 sec to indicate singificant improvement gait speed and dynamic balance with use of FWW    Baseline  27sec; 05/22/2019: 16sec; 06/19/2019: 15.5sec    Time  6    Period  Weeks    Status  On-going      PT LONG TERM GOAL #4   Title  Patient will improve 5 x STS to under 12sec to decrease fall risk and improve LE  functional strength.    Baseline  5xSTS: 27 sec; 05/22/2019: 20 sec; 06/19/2019: 20sec    Time  6    Period  Weeks    Status  On-going      PT LONG TERM GOAL #5   Title  Patient will improve gait speed to > .8778m/s to indicate significant improvement with ability to community ambulate with decreased fall risk    Baseline  .37 m/s; 05/22/2019: .69 m/s; 06/19/2019:  .73 m/s    Time  6    Period  Weeks    Status  On-going            Plan - 07/18/19 1557    Clinical Impression Statement  Focused treatment today on extensor power and functional SLS balance activities for improved step length and step time with gait for improved efficiency and energy economy. Early fatigue evident in hip flexion and extension. Plan to reduce intensity of therapeutic exercises for improved muscular endurance. Patient will benefit from skilled physical therapy to improve cardiovascular endurance and reduce fall risk.    Personal Factors and Comorbidities  Comorbidity 2    Comorbidities  DVT, THA, CVA, "lung disorder"    Examination-Activity Limitations  Carry;Lift;Locomotion Level;Stand;Stairs    Examination-Participation Restrictions  Cleaning;Community Activity;Meal Prep    Stability/Clinical Decision Making  Evolving/Moderate complexity    Rehab Potential  Fair    Clinical Impairments Affecting Rehab Potential  (+) highly motivated, family support (-) age, coormorbities    PT Frequency  2x / week    PT Duration  6 weeks    PT Treatment/Interventions  Electrical Stimulation;Iontophoresis 4mg /ml Dexamethasone;Aquatic Therapy;Cryotherapy;Moist Heat;Therapeutic activities;Patient/family education;Manual techniques;Balance training;Stair training;Gait training;Neuromuscular re-education;Therapeutic exercise;Passive range of motion    PT Next Visit Plan  Progress strengthening and balance    PT Home Exercise Plan  See education section    Consulted and Agree with Plan of Care  Patient       Patient will benefit  from skilled therapeutic intervention in order to improve the following deficits and impairments:  Abnormal gait, Pain, Decreased coordination, Decreased mobility, Increased muscle spasms, Postural dysfunction, Decreased endurance, Decreased range of motion, Decreased strength, Decreased balance, Difficulty walking, Decreased activity tolerance  Visit Diagnosis: Pain in right hip  Muscle weakness (generalized)  Difficulty in walking, not elsewhere classified     Problem List Patient Active Problem List   Diagnosis Date Noted  . Acute DVT (deep  venous thrombosis) (HCC) 04/29/2018  . 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 Atrial fibrillation (HCC)   . Chronic systolic heart failure (HCC)   . Osteoporosis, post-menopausal   . Osteoarthritis, multiple sites     Janee Morn, SPT 07/18/2019, 6:56 PM  [facility] [address] [place], [address] Phone: [phone]   Fax:  [phone]  Name: Diana Powell MRN: [id number] Date of Birth: Aug 24, 1926

## 2019-07-24 ENCOUNTER — Other Ambulatory Visit: Payer: Self-pay

## 2019-07-24 ENCOUNTER — Ambulatory Visit: Payer: Medicare Other | Attending: Geriatric Medicine

## 2019-07-24 DIAGNOSIS — R262 Difficulty in walking, not elsewhere classified: Secondary | ICD-10-CM

## 2019-07-24 DIAGNOSIS — M6281 Muscle weakness (generalized): Secondary | ICD-10-CM | POA: Diagnosis present

## 2019-07-24 DIAGNOSIS — M25551 Pain in right hip: Secondary | ICD-10-CM | POA: Diagnosis present

## 2019-07-24 NOTE — Therapy (Signed)
[place] [facility] MED[facility]INE [address] [place], [address] Phone: [phone]   Fax:  [phone]  Physical Therapy Treatment  Patient Details  Name: Diana Powell MRN: [id number] Date of Birth: 1926/08/18 Referring Provider (PT): Augusto Garbe MD   Encounter Date: 07/24/2019  PT End of Session - 07/24/19 1405    Visit Number  12    Number of Visits  16    Date for PT Re-Evaluation  07/30/18    Authorization Type  3/10    Authorization Time Period  06/19/19-07/31/19    PT Start Time  1345    PT Stop Time  1430    PT Time Calculation (min)  45 min    Equipment Utilized During Treatment  Gait belt    Activity Tolerance  Patient tolerated treatment well;Patient limited by fatigue    Behavior During Therapy  [facility] for tasks assessed/performed       Past Medical History:  Diagnosis Date  . Atrial fibrillation (HCC)   . Chronic systolic heart failure (HCC)   . COPD (chronic obstructive pulmonary disease) (HCC)   . Osteoarthritis, multiple sites   . Osteoporosis, post-menopausal   . Presence of permanent cardiac pacemaker     Past Surgical History:  Procedure Laterality Date  . CARDIOVERSION  ~2009  . HIP FRACTURE SURGERY Bilateral [phone]   each hip fractured at separate times  . INSERT / REPLACE / REMOVE PACEMAKER    . TOTAL HIP ARTHROPLASTY Right 04/20/2018   Procedure: TOTAL HIP CONVERSION;  Surgeon: Kennedy Bucker, MD;  Location: [facility] ORS;  Service: Orthopedics;  Laterality: Right;    There were no vitals filed for this visit.  Subjective Assessment - 07/24/19 1439    Subjective  Patient reports she had taken a nap before today's visit.    Patient is accompained by:  Family member    Pertinent History  CVA with perserved EF, self-reported 'Lung condition', R THA, DVT, wrist fx    Limitations  Lifting;Standing;Walking    Patient Stated Goals  To walk around easier    Currently in Pain?  No/denies           TREATMENT Therapeutic Exercise Marches in standing - x 10 ft with focus on SLS Ambulation with focus on improving speed able to perform 476ft in 2 min 12 secs Step ups performed B along the 6" - x 5 B Tandem ambulation -- 20" x 2  SLS intermittently - x25 1sec holds B Ambulation with focus on improving distance - 950ft with use of FWW Side stepping in standing with UE support with therapist - x 15 B Performed exercises to improve fall risk, endurance and walking ability    PT Education - 07/24/19 1359    Education Details  form/technique with exercise    Person(s) Educated  Patient    Methods  Explanation;Demonstration    Comprehension  Verbalized understanding;Returned demonstration          PT Long Term Goals - 07/04/19 1040      PT LONG TERM GOAL #1   Title  Patient will be independent with HEP to continue benefits of therapy until after discharge.     Baseline  Dependent with form/technique; Independent with form/technique    Time  6    Period  Weeks    Status  Achieved      PT LONG TERM GOAL #2   Title  Patient will be able to ambulate over 771ft ith  to improve cardiovascular endurance and improve ability to walk in the community.    Baseline  333ft; 05/22/2019: 6107ft; 06/19/2019: 525ft    Time  6    Period  Weeks    Status  On-going      PT LONG TERM GOAL #3   Title  Patient will be able to perform the TUG in under 12 sec to indicate singificant improvement gait speed and dynamic balance with use of FWW    Baseline  27sec; 05/22/2019: 16sec; 06/19/2019: 15.5sec    Time  6    Period  Weeks    Status  On-going      PT LONG TERM GOAL #4   Title  Patient will improve 5 x STS to under 12sec to decrease fall risk and improve LE functional strength.    Baseline  5xSTS: 27 sec; 05/22/2019: 20 sec; 06/19/2019: 20sec    Time  6    Period  Weeks    Status  On-going      PT LONG TERM GOAL #5   Title  Patient will improve gait speed to > .51m/s to indicate  significant improvement with ability to community ambulate with decreased fall risk    Baseline  .37 m/s; 05/22/2019: .69 m/s; 06/19/2019:  .73 m/s    Time  6    Period  Weeks    Status  On-going            Plan - 07/24/19 1405    Clinical Impression Statement  Patient demonstrates increased difficulty with gaging height and distance with stepping activities. Patient demonstrates improvement with cardiovascular endurance and walking for longer periods of time at greater speeds. Patient able to amb 478ft in 12 sec which is a significant improvement compared to her previous times which resulted in a time averaging ~1 min per mile. Patient is improving overall and will beenfit from further skilled therapy to return to prior level of function.    Personal Factors and Comorbidities  Comorbidity 2    Comorbidities  DVT, THA, CVA, "lung disorder"    Examination-Activity Limitations  Carry;Lift;Locomotion Level;Stand;Stairs    Examination-Participation Restrictions  Cleaning;Community Activity;Meal Prep    Stability/Clinical Decision Making  Evolving/Moderate complexity    Rehab Potential  Fair    Clinical Impairments Affecting Rehab Potential  (+) highly motivated, family support (-) age, coormorbities    PT Frequency  2x / week    PT Duration  6 weeks    PT Treatment/Interventions  Electrical Stimulation;Iontophoresis 4mg /ml Dexamethasone;Aquatic Therapy;Cryotherapy;Moist Heat;Therapeutic activities;Patient/family education;Manual techniques;Balance training;Stair training;Gait training;Neuromuscular re-education;Therapeutic exercise;Passive range of motion    PT Next Visit Plan  Progress strengthening and balance    PT Home Exercise Plan  See education section    Consulted and Agree with Plan of Care  Patient       Patient will benefit from skilled therapeutic intervention in order to improve the following deficits and impairments:  Abnormal gait, Pain, Decreased coordination, Decreased  mobility, Increased muscle spasms, Postural dysfunction, Decreased endurance, Decreased range of motion, Decreased strength, Decreased balance, Difficulty walking, Decreased activity tolerance  Visit Diagnosis: Pain in right hip  Muscle weakness (generalized)  Difficulty in walking, not elsewhere classified     Problem List Patient Active Problem List   Diagnosis Date Noted  . Acute DVT (deep venous thrombosis) (HCC) 04/29/2018  . 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 Atrial fibrillation (HCC)   .  Chronic systolic heart failure ([place])   . Osteoporosis, post-menopausal   . Osteoarthritis, multiple sites     Blythe Stanford ,PT DPT 07/24/2019, 2:41 PM  [place] PH[facility] AND SPORTS MEDICINE [address] [place], [address] Phone: [phone]   Fax:  [phone]  Name: Diana Powell MRN: [id number] Date of Birth: May 01, 1927

## 2019-08-01 ENCOUNTER — Ambulatory Visit: Payer: Medicare Other

## 2019-08-01 ENCOUNTER — Other Ambulatory Visit: Payer: Self-pay

## 2019-08-01 DIAGNOSIS — R262 Difficulty in walking, not elsewhere classified: Secondary | ICD-10-CM

## 2019-08-01 DIAGNOSIS — M6281 Muscle weakness (generalized): Secondary | ICD-10-CM

## 2019-08-01 DIAGNOSIS — M25551 Pain in right hip: Secondary | ICD-10-CM

## 2019-08-01 NOTE — Therapy (Addendum)
[facility] [address] [place], [address] Phone: [phone]   Fax:  [phone]  Physical Therapy Treatment/Progress Note  Patient Details  Name: Diana Powell MRN: [id number] Date of Birth: [birth year] Referring Provider (PT): Augusto Garbe MD  Reporting: 07/04/2019 - 07/31/2018  Encounter Date: 08/01/2019  PT End of Session - 08/01/19 1740    Visit Number  13    Number of Visits  16    Date for PT Re-Evaluation  07/30/18    Authorization Type  4/10    Authorization Time Period  06/19/19-07/31/19    PT Start Time  1619    PT Stop Time  1700    PT Time Calculation (min)  41 min    Equipment Utilized During Treatment  Gait belt    Activity Tolerance  Patient tolerated treatment well;Patient limited by fatigue    Behavior During Therapy  [facility] for tasks assessed/performed       Past Medical History:  Diagnosis Date  . Atrial fibrillation (HCC)   . Chronic systolic heart failure (HCC)   . COPD (chronic obstructive pulmonary disease) (HCC)   . Osteoarthritis, multiple sites   . Osteoporosis, post-menopausal   . Presence of permanent cardiac pacemaker     Past Surgical History:  Procedure Laterality Date  . CARDIOVERSION  ~2009  . HIP FRACTURE SURGERY Bilateral [phone]   each hip fractured at separate times  . INSERT / REPLACE / REMOVE PACEMAKER    . TOTAL HIP ARTHROPLASTY Right 04/20/2018   Procedure: TOTAL HIP CONVERSION;  Surgeon: Kennedy Bucker, MD;  Location: [facility] ORS;  Service: Orthopedics;  Laterality: Right;    There were no vitals filed for this visit.  Subjective Assessment - 08/01/19 1739    Subjective  Patient's son reports she is tired today    Patient is accompained by:  Family member    Pertinent History  CVA with perserved EF, self-reported 'Lung condition', R THA, DVT, wrist fx    Limitations  Lifting;Standing;Walking    Patient Stated Goals  To walk around easier    Currently in  Pain?  No/denies      TREATMENT  Vitals: Pre-exercise: 72 HR, 94 O2 Ambulation 500 ft with FWW, CGA Post-exercise:  74 HR, 92 O2   TE Step up and over x 6 Lateral step ups x 8 R/L Yellow banded hip abd 2 x 5 R/L Standing marches 2 x 5 R/L Mini-marches maintaining toe contact on contralateral foot x 15 R/L SLS holds 3 sec x 8 R/L Ambulation with speed changes at therapist direction x 5 min for increased hip extension  Post-ambulation HR 80, O2 93  TE performed with therapist CGA to min A for balance and safety for improved power and stability in BLE and hip extension for reduced fall risk   PT Long Term Goals - 07/04/19 1040      PT LONG TERM GOAL #1   Title  Patient will be independent with HEP to continue benefits of therapy until after discharge.     Baseline  Dependent with form/technique; Independent with form/technique    Time  6    Period  Weeks    Status  Achieved      PT LONG TERM GOAL #2   Title  Patient will be able to ambulate over 729ft ith to improve cardiovascular endurance and improve ability to walk in the community.    Baseline  361ft; 05/22/2019: 638ft; 06/19/2019: 565ft  Time  6    Period  Weeks    Status  On-going      PT LONG TERM GOAL #3   Title  Patient will be able to perform the TUG in under 12 sec to indicate singificant improvement gait speed and dynamic balance with use of FWW    Baseline  27sec; 05/22/2019: 16sec; 06/19/2019: 15.5sec    Time  6    Period  Weeks    Status  On-going      PT LONG TERM GOAL #4   Title  Patient will improve 5 x STS to under 12sec to decrease fall risk and improve LE functional strength.    Baseline  5xSTS: 27 sec; 05/22/2019: 20 sec; 06/19/2019: 20sec    Time  6    Period  Weeks    Status  On-going      PT LONG TERM GOAL #5   Title  Patient will improve gait speed to > .19m/s to indicate significant improvement with ability to community ambulate with decreased fall risk    Baseline  .37 m/s;  05/22/2019: .69 m/s; 06/19/2019:  .73 m/s    Time  6    Period  Weeks    Status  On-going            Plan - 08/01/19 1741    Clinical Impression Statement  Patient demonstrates early fatigue with power-based therex. Note apprehension with SLS activities but pt is able to participate and improve with repetitions. Pt demonstrates ability to change speed with gait but forward trunk lean with increased speed is evident; additional strengthening in extension needed to improve balance and step length. Patient will benefit from skilled physical therapy to reduce fall risk and improve activity tolerance.    Personal Factors and Comorbidities  Comorbidity 2    Comorbidities  DVT, THA, CVA, "lung disorder"    Examination-Activity Limitations  Carry;Lift;Locomotion Level;Stand;Stairs    Examination-Participation Restrictions  Cleaning;Community Activity;Meal Prep    Stability/Clinical Decision Making  Evolving/Moderate complexity    Rehab Potential  Fair    Clinical Impairments Affecting Rehab Potential  (+) highly motivated, family support (-) age, coormorbities    PT Frequency  2x / week    PT Duration  6 weeks    PT Treatment/Interventions  Electrical Stimulation;Iontophoresis 4mg /ml Dexamethasone;Aquatic Therapy;Cryotherapy;Moist Heat;Therapeutic activities;Patient/family education;Manual techniques;Balance training;Stair training;Gait training;Neuromuscular re-education;Therapeutic exercise;Passive range of motion    PT Next Visit Plan  Progress strengthening and balance    PT Home Exercise Plan  See education section    Consulted and Agree with Plan of Care  Patient       Patient will benefit from skilled therapeutic intervention in order to improve the following deficits and impairments:  Abnormal gait, Pain, Decreased coordination, Decreased mobility, Increased muscle spasms, Postural dysfunction, Decreased endurance, Decreased range of motion, Decreased strength, Decreased balance,  Difficulty walking, Decreased activity tolerance  Visit Diagnosis: No diagnosis found.     Problem List Patient Active Problem List   Diagnosis Date Noted  . Acute DVT (deep venous thrombosis) (HCC) 04/29/2018  . 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 Atrial fibrillation (HCC)   . Chronic systolic heart failure (HCC)   . Osteoporosis, post-menopausal   . Osteoarthritis, multiple sites     05/06/2013, SPT 08/01/2019, 5:54 PM  [place] [facility] [address], [place] Phone:  [phone]   Fax:  [phone]  Name: Diana Powell MRN: [id number] Date of Birth: 1926-09-10

## 2019-08-08 ENCOUNTER — Other Ambulatory Visit: Payer: Self-pay

## 2019-08-08 ENCOUNTER — Ambulatory Visit: Payer: Medicare Other

## 2019-08-08 DIAGNOSIS — R262 Difficulty in walking, not elsewhere classified: Secondary | ICD-10-CM

## 2019-08-08 DIAGNOSIS — M25551 Pain in right hip: Secondary | ICD-10-CM | POA: Diagnosis not present

## 2019-08-08 DIAGNOSIS — M6281 Muscle weakness (generalized): Secondary | ICD-10-CM

## 2019-08-08 NOTE — Therapy (Signed)
[place] [facility] [address] [place], [address] Phone: [phone]   Fax:  [phone]  Physical Therapy Treatment  Patient Details  Name: Diana Powell MRN: [id number] Date of Birth: 05/04/1927 Referring Provider (PT): Frazier Butt MD   Encounter Date: 08/08/2019  PT End of Session - 08/08/19 1950    Visit Number  14    Number of Visits  16    Date for PT Re-Evaluation  07/30/18    Authorization Type  5/10    Authorization Time Period  06/19/19-07/31/19    PT Start Time  1345    PT Stop Time  1430    PT Time Calculation (min)  45 min    Equipment Utilized During Treatment  Gait belt    Activity Tolerance  Patient tolerated treatment well;Patient limited by fatigue    Behavior During Therapy  [facility] for tasks assessed/performed       Past Medical History:  Diagnosis Date  . Atrial fibrillation ([place])   . Chronic systolic heart failure ([place])   . COPD (chronic obstructive pulmonary disease) ([place])   . Osteoarthritis, multiple sites   . Osteoporosis, post-menopausal   . Presence of permanent cardiac pacemaker     Past Surgical History:  Procedure Laterality Date  . CARDIOVERSION  ~2009  . HIP FRACTURE SURGERY Bilateral [phone]   each hip fractured at separate times  . INSERT / REPLACE / REMOVE PACEMAKER    . TOTAL HIP ARTHROPLASTY Right 04/20/2018   Procedure: TOTAL HIP CONVERSION;  Surgeon: Hessie Knows, MD;  Location: [facility] ORS;  Service: Orthopedics;  Laterality: Right;    There were no vitals filed for this visit.  Subjective Assessment - 08/08/19 1346    Subjective  Patient's son reports she is doing better than last time.    Patient is accompained by:  Family member    Pertinent History  CVA with perserved EF, self-reported 'Lung condition', R THA, DVT, wrist fx    Limitations  Lifting;Standing;Walking    Patient Stated Goals  To walk around easier    Currently in Pain?  No/denies         TREATMENT  TE Vitals: 73 HR, 93 O2  7 laps in 6 min  STS x 10  Standing hip ext with UE support close to x 5 Monster walks with yellow theraband at ankles 15 ft x 4 Standing marches x 5 R/L Standing SLS holds 3 sec x 6 R/L Heel raises x 10 Ambulation with speed changes at therapist direction x 5 min for increased hip extension  Finish O2 92 Pulse 80  Vitals: Pre-exercise: 72 HR, 94 O2 Ambulation 500 ft with FWW, CGA Post-exercise:  74 HR, 92 O2   TE Step up and over x 6 Lateral step ups x 8 R/L Yellow banded hip abd 2 x 5 R/L Standing marches 2 x 5 R/L Mini-marches maintaining toe contact on contralateral foot x 15 R/L SLS holds 3 sec x 8 R/L   Post-ambulation HR 80, O2 93  TE performed with therapist CGA to min A for balance and safety for improved power and stability in BLE and hip extension for reduced fall risk   PT Education - 08/08/19 1343    Education Details  form/technique with exercise    Person(s) Educated  Patient    Methods  Explanation;Demonstration;Verbal cues    Comprehension  Verbalized understanding;Returned demonstration;Verbal cues required          PT Long  Term Goals - 08/02/19 0854      PT LONG TERM GOAL #1   Title  Patient will be independent with HEP to continue benefits of therapy until after discharge.     Baseline  Dependent with form/technique; Independent with form/technique;    Time  6    Period  Weeks    Status  Achieved      PT LONG TERM GOAL #2   Title  Patient will be able to ambulate over 715ft ith to improve cardiovascular endurance and improve ability to walk in the community.    Baseline  351ft; 05/22/2019: 656ft; 06/19/2019: 541ft; 08/01/2019: Deferred    Time  6    Period  Weeks    Status  On-going      PT LONG TERM GOAL #3   Title  Patient will be able to perform the TUG in under 12 sec to indicate singificant improvement gait speed and dynamic balance with use of FWW    Baseline  27sec;  05/22/2019: 16sec; 06/19/2019: 15.5sec; 08/01/2019: Deferred    Time  6    Period  Weeks    Status  On-going      PT LONG TERM GOAL #4   Title  Patient will improve 5 x STS to under 12sec to decrease fall risk and improve LE functional strength.    Baseline  5xSTS: 27 sec; 05/22/2019: 20 sec; 06/19/2019: 20sec; 08/01/2019: Deferred    Time  6    Period  Weeks    Status  On-going      PT LONG TERM GOAL #5   Title  Patient will improve gait speed to > .54m/s to indicate significant improvement with ability to community ambulate with decreased fall risk    Baseline  .37 m/s; 05/22/2019: .69 m/s; 06/19/2019:  .73 m/s; 08/01/2019: Deferred    Time  6    Period  Weeks    Status  On-going            Plan - 08/08/19 1552    Clinical Impression Statement  Patient demonstrates mild improvement in activity tolerance with SLS based activities, likely due to reduced apprehension. Able to tolerate extension-based therex well although early fatigue is evident. Patient will benefit from skilled physical therapy to reduce fall risk and improve activity tolerance.    Personal Factors and Comorbidities  Comorbidity 2    Comorbidities  DVT, THA, CVA, "lung disorder"    Examination-Activity Limitations  Carry;Lift;Locomotion Level;Stand;Stairs    Examination-Participation Restrictions  Cleaning;Community Activity;Meal Prep    Stability/Clinical Decision Making  Evolving/Moderate complexity    Rehab Potential  Fair    Clinical Impairments Affecting Rehab Potential  (+) highly motivated, family support (-) age, coormorbities    PT Frequency  2x / week    PT Duration  6 weeks    PT Treatment/Interventions  Electrical Stimulation;Iontophoresis 4mg /ml Dexamethasone;Aquatic Therapy;Cryotherapy;Moist Heat;Therapeutic activities;Patient/family education;Manual techniques;Balance training;Stair training;Gait training;Neuromuscular re-education;Therapeutic exercise;Passive range of motion    PT Next Visit Plan   Progress strengthening and balance    PT Home Exercise Plan  See education section    Consulted and Agree with Plan of Care  Patient       Patient will benefit from skilled therapeutic intervention in order to improve the following deficits and impairments:  Abnormal gait, Pain, Decreased coordination, Decreased mobility, Increased muscle spasms, Postural dysfunction, Decreased endurance, Decreased range of motion, Decreased strength, Decreased balance, Difficulty walking, Decreased activity tolerance  Visit Diagnosis: Pain in right hip  Muscle weakness (generalized)  Difficulty in walking, not elsewhere classified     Problem List Patient Active Problem List   Diagnosis Date Noted  . Acute DVT (deep venous thrombosis) (HCC) 04/29/2018  . 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 Atrial fibrillation (HCC)   . Chronic systolic heart failure (HCC)   . Osteoporosis, post-menopausal   . Osteoarthritis, multiple sites     Janee Morn, SPT 08/08/2019, 7:51 PM  [place] [facility] [address] [place], [address] Phone: [phone]   Fax:  [phone]  Name: Diana Powell MRN: [id number] Date of Birth: Jan 20, 1927

## 2019-08-15 ENCOUNTER — Ambulatory Visit: Payer: Medicare Other

## 2019-08-15 ENCOUNTER — Other Ambulatory Visit: Payer: Self-pay

## 2019-08-15 DIAGNOSIS — M25551 Pain in right hip: Secondary | ICD-10-CM | POA: Diagnosis not present

## 2019-08-15 DIAGNOSIS — R262 Difficulty in walking, not elsewhere classified: Secondary | ICD-10-CM

## 2019-08-15 DIAGNOSIS — M6281 Muscle weakness (generalized): Secondary | ICD-10-CM

## 2019-08-15 NOTE — Therapy (Signed)
[place] [facility] [address] [place], [address] Phone: [phone]   Fax:  [phone]  Physical Therapy Treatment  Patient Details  Name: Diana Powell MRN: [id number] Date of Birth: 25-Jun-1927 Referring Provider (PT): Augusto Garbe MD   Encounter Date: 08/15/2019  PT End of Session - 08/15/19 1930    Visit Number  15    Number of Visits  17    Date for PT Re-Evaluation  08/29/19    Authorization Type  6/10    PT Start Time  1601    PT Stop Time  1645    PT Time Calculation (min)  44 min    Equipment Utilized During Treatment  Gait belt    Activity Tolerance  Patient limited by fatigue;Patient tolerated treatment well    Behavior During Therapy  [facility] for tasks assessed/performed       Past Medical History:  Diagnosis Date  . Atrial fibrillation (HCC)   . Chronic systolic heart failure (HCC)   . COPD (chronic obstructive pulmonary disease) (HCC)   . Osteoarthritis, multiple sites   . Osteoporosis, post-menopausal   . Presence of permanent cardiac pacemaker     Past Surgical History:  Procedure Laterality Date  . CARDIOVERSION  ~2009  . HIP FRACTURE SURGERY Bilateral [phone]   each hip fractured at separate times  . INSERT / REPLACE / REMOVE PACEMAKER    . TOTAL HIP ARTHROPLASTY Right 04/20/2018   Procedure: TOTAL HIP CONVERSION;  Surgeon: Kennedy Bucker, MD;  Location: [facility] ORS;  Service: Orthopedics;  Laterality: Right;    There were no vitals filed for this visit.  Subjective Assessment - 08/15/19 1602    Subjective  Patient's son reports patient has been breathing harder today    Patient is accompained by:  Family member    Pertinent History  CVA with perserved EF, self-reported 'Lung condition', R THA, DVT, wrist fx    Limitations  Lifting;Standing;Walking    Patient Stated Goals  To walk around easier         TREATMENT  Vitals HR 70, O2 96  TE Ambulation x 500 ft with CGA and FWW,  cues for breathing to reduce fatigue HR 73 O2 89 stabilizes to O2 94 after 1 min rest  Yellow banded lateral steps 10 ft x 2 Yellow banded walking 20 ft x 2 Step up and overs x 4 SLS marches x 10 R/L STS x 5 with UE raised overhead x 3  Ambulation 250 ft x 1, 400 ft x 1 CGA FWW with cues for breathing, upright posture for improved extension and efficiency with gait - patient fatigue apparent with drop in O2 sat to 89, stabilizing to 92 after 2 min rest  TE for improved activity tolerance and strengthening of BLE, performed with CGA for safety and cues throughout for breathing   PT Education - 08/15/19 1932    Education Details  form/technique with exercise    Person(s) Educated  Patient    Methods  Explanation;Demonstration;Tactile cues;Verbal cues    Comprehension  Verbalized understanding;Returned demonstration;Tactile cues required;Verbal cues required          PT Long Term Goals - 08/02/19 0854      PT LONG TERM GOAL #1   Title  Patient will be independent with HEP to continue benefits of therapy until after discharge.     Baseline  Dependent with form/technique; Independent with form/technique;    Time  6    Period  Weeks    Status  Achieved      PT LONG TERM GOAL #2   Title  Patient will be able to ambulate over 728ft ith to improve cardiovascular endurance and improve ability to walk in the community.    Baseline  330ft; 05/22/2019: 648ft; 06/19/2019: 564ft; 08/01/2019: Deferred    Time  6    Period  Weeks    Status  On-going      PT LONG TERM GOAL #3   Title  Patient will be able to perform the TUG in under 12 sec to indicate singificant improvement gait speed and dynamic balance with use of FWW    Baseline  27sec; 05/22/2019: 16sec; 06/19/2019: 15.5sec; 08/01/2019: Deferred    Time  6    Period  Weeks    Status  On-going      PT LONG TERM GOAL #4   Title  Patient will improve 5 x STS to under 12sec to decrease fall risk and improve LE functional  strength.    Baseline  5xSTS: 27 sec; 05/22/2019: 20 sec; 06/19/2019: 20sec; 08/01/2019: Deferred    Time  6    Period  Weeks    Status  On-going      PT LONG TERM GOAL #5   Title  Patient will improve gait speed to > .77m/s to indicate significant improvement with ability to community ambulate with decreased fall risk    Baseline  .37 m/s; 05/22/2019: .69 m/s; 06/19/2019:  .73 m/s; 08/01/2019: Deferred    Time  6    Period  Weeks    Status  On-going            Plan - 08/15/19 1925    Clinical Impression Statement  Patient reports to PT with increased fatigue and decreased activity tolerance. Treatment session significant for drop in O2 sat with longer gait distances to 89. However pt is able to improve gait kinematics with cueing for upright posture and extension-based therex. Patient will benefit from skilled physical therapy to reduce fall risk and improve activity tolerance.    Personal Factors and Comorbidities  Comorbidity 2    Comorbidities  DVT, THA, CVA, "lung disorder"    Examination-Activity Limitations  Carry;Lift;Locomotion Level;Stand;Stairs    Examination-Participation Restrictions  Cleaning;Community Activity;Meal Prep    Stability/Clinical Decision Making  Evolving/Moderate complexity    Rehab Potential  Fair    Clinical Impairments Affecting Rehab Potential  (+) highly motivated, family support (-) age, coormorbities    PT Frequency  2x / week    PT Duration  6 weeks    PT Treatment/Interventions  Electrical Stimulation;Iontophoresis 4mg /ml Dexamethasone;Aquatic Therapy;Cryotherapy;Moist Heat;Therapeutic activities;Patient/family education;Manual techniques;Balance training;Stair training;Gait training;Neuromuscular re-education;Therapeutic exercise;Passive range of motion    PT Next Visit Plan  Progress strengthening and balance    PT Home Exercise Plan  See education section    Consulted and Agree with Plan of Care  Patient       Patient will benefit from skilled  therapeutic intervention in order to improve the following deficits and impairments:  Abnormal gait, Pain, Decreased coordination, Decreased mobility, Increased muscle spasms, Postural dysfunction, Decreased endurance, Decreased range of motion, Decreased strength, Decreased balance, Difficulty walking, Decreased activity tolerance  Visit Diagnosis: Pain in right hip  Difficulty in walking, not elsewhere classified  Muscle weakness (generalized)     Problem List Patient Active Problem List   Diagnosis Date Noted  . Acute DVT (deep venous thrombosis) (HCC) 04/29/2018  . Status post total hip replacement,  right 04/20/2018  . Dementia due to medical condition without behavioral disturbance ([place]) 05/04/2013  . Atrial fibrillation ([place])   . Chronic systolic heart failure ([place])   . Osteoporosis, post-menopausal   . Osteoarthritis, multiple sites     Virgia Land, SPT 08/15/2019, 7:33 PM  [place] [facility] [address] [place], [address] Phone: [phone]   Fax:  [phone]  Name: Diana Powell MRN: [id number] Date of Birth: 1926/11/15

## 2019-08-23 ENCOUNTER — Other Ambulatory Visit: Payer: Self-pay

## 2019-08-23 ENCOUNTER — Ambulatory Visit: Payer: Medicare Other | Attending: Geriatric Medicine

## 2019-08-23 DIAGNOSIS — R262 Difficulty in walking, not elsewhere classified: Secondary | ICD-10-CM | POA: Insufficient documentation

## 2019-08-23 DIAGNOSIS — M6281 Muscle weakness (generalized): Secondary | ICD-10-CM | POA: Diagnosis present

## 2019-08-23 DIAGNOSIS — M25551 Pain in right hip: Secondary | ICD-10-CM | POA: Insufficient documentation

## 2019-08-23 NOTE — Therapy (Signed)
[facility]INE [address], [place], [address] Phone: [phone]   Fax:  [phone]  Physical Therapy Treatment  Patient Details  Name: Diana Powell MRN: [id number] Date of Birth: Jun 01, 1927 Referring Provider (PT): Augusto Garbe MD   Encounter Date: 08/23/2019  PT End of Session - 08/23/19 1343    Visit Number  16    Number of Visits  17    Date for PT Re-Evaluation  08/29/19    Authorization Type  6/10    PT Start Time  1345    PT Stop Time  1430    PT Time Calculation (min)  45 min    Equipment Utilized During Treatment  Gait belt    Activity Tolerance  Patient limited by fatigue;Patient tolerated treatment well    Behavior During Therapy  [facility] for tasks assessed/performed       Past Medical History:  Diagnosis Date  . Atrial fibrillation (HCC)   . Chronic systolic heart failure (HCC)   . COPD (chronic obstructive pulmonary disease) (HCC)   . Osteoarthritis, multiple sites   . Osteoporosis, post-menopausal   . Presence of permanent cardiac pacemaker     Past Surgical History:  Procedure Laterality Date  . CARDIOVERSION  ~2009  . HIP FRACTURE SURGERY Bilateral [phone]   each hip fractured at separate times  . INSERT / REPLACE / REMOVE PACEMAKER    . TOTAL HIP ARTHROPLASTY Right 04/20/2018   Procedure: TOTAL HIP CONVERSION;  Surgeon: Kennedy Bucker, MD;  Location: [facility] ORS;  Service: Orthopedics;  Laterality: Right;    There were no vitals filed for this visit.  Subjective Assessment - 08/23/19 1346    Subjective  Patient's son reports that yesterday she "woke up and couldn't breathe at all" and that she's going through a "time of trouble".    Patient is accompained by:  Family member    Pertinent History  CVA with perserved EF, self-reported 'Lung condition', R THA, DVT, wrist fx    Limitations  Lifting;Standing;Walking    Patient Stated Goals  To walk around easier    Currently in Pain?   No/denies            TREATMENT  TE Ambulation 400 ft in 3:30 min with FWW to improve ability to navigate crowded areas with walker and visual impairment  Standing hip ext x 5 R/L with BUE support for balance Squats with chair target x 8 - note adductor compensation Lateral steps with HHA 10 ft x 2 R/L SLS marches with 3 sec hold with HHA x 6 R/L Square stepping x 4 rounds with HHA  TE for improved activity tolerance and gait kinematics  O2 monitored and remained above 94 throughout session  MT STM to BLE quads for pain relief and improved therapy tolerance x 10 min     PT Long Term Goals - 08/02/19 0854      PT LONG TERM GOAL #1   Title  Patient will be independent with HEP to continue benefits of therapy until after discharge.     Baseline  Dependent with form/technique; Independent with form/technique;    Time  6    Period  Weeks    Status  Achieved      PT LONG TERM GOAL #2   Title  Patient will be able to ambulate over 767ft ith to improve cardiovascular endurance and improve ability to walk in the community.    Baseline  370ft; 05/22/2019: 624ft;  06/19/2019: 535ft; 08/01/2019: Deferred    Time  6    Period  Weeks    Status  On-going      PT LONG TERM GOAL #3   Title  Patient will be able to perform the TUG in under 12 sec to indicate singificant improvement gait speed and dynamic balance with use of FWW    Baseline  27sec; 05/22/2019: 16sec; 06/19/2019: 15.5sec; 08/01/2019: Deferred    Time  6    Period  Weeks    Status  On-going      PT LONG TERM GOAL #4   Title  Patient will improve 5 x STS to under 12sec to decrease fall risk and improve LE functional strength.    Baseline  5xSTS: 27 sec; 05/22/2019: 20 sec; 06/19/2019: 20sec; 08/01/2019: Deferred    Time  6    Period  Weeks    Status  On-going      PT LONG TERM GOAL #5   Title  Patient will improve gait speed to > .31m/s to indicate significant improvement with ability to community ambulate with  decreased fall risk    Baseline  .37 m/s; 05/22/2019: .69 m/s; 06/19/2019:  .73 m/s; 08/01/2019: Deferred    Time  6    Period  Weeks    Status  On-going            Plan - 08/23/19 1441    Clinical Impression Statement  Fatigue and activity tolerance remains today. Pt reports increased swelling in BLE. Reduced intensity of exercises today to maintain O2 above 92 today and note reduced episodes of SOB. Patient demonstrates improved activity tolerance with standing rest breaks with cues for breathing and upright posture. Plan to reduce intensity with increased volume with functional exercise for improvement of cardiovascular endurance. Patient will benefit from skilled physical therapy to reduce fall risk and improve activity tolerance.    Personal Factors and Comorbidities  Comorbidity 2    Comorbidities  DVT, THA, CVA, "lung disorder"    Examination-Activity Limitations  Carry;Lift;Locomotion Level;Stand;Stairs    Examination-Participation Restrictions  Cleaning;Community Activity;Meal Prep    Stability/Clinical Decision Making  Evolving/Moderate complexity    Rehab Potential  Fair    Clinical Impairments Affecting Rehab Potential  (+) highly motivated, family support (-) age, coormorbities    PT Frequency  2x / week    PT Duration  6 weeks    PT Treatment/Interventions  Electrical Stimulation;Iontophoresis 4mg /ml Dexamethasone;Aquatic Therapy;Cryotherapy;Moist Heat;Therapeutic activities;Patient/family education;Manual techniques;Balance training;Stair training;Gait training;Neuromuscular re-education;Therapeutic exercise;Passive range of motion    PT Next Visit Plan  Progress strengthening and balance    PT Home Exercise Plan  See education section    Consulted and Agree with Plan of Care  Patient       Patient will benefit from skilled therapeutic intervention in order to improve the following deficits and impairments:  Abnormal gait, Pain, Decreased coordination, Decreased mobility,  Increased muscle spasms, Postural dysfunction, Decreased endurance, Decreased range of motion, Decreased strength, Decreased balance, Difficulty walking, Decreased activity tolerance  Visit Diagnosis: Pain in right hip  Difficulty in walking, not elsewhere classified  Muscle weakness (generalized)     Problem List Patient Active Problem List   Diagnosis Date Noted  . Acute DVT (deep venous thrombosis) ([place]) 04/29/2018  . 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 Dementia due to medical condition without behavioral disturbance ([place]) 05/04/2013  . Atrial fibrillation ([place])   . Chronic systolic heart failure ([place])   . Osteoporosis, post-menopausal   .  Osteoarthritis, multiple sites     Janee Morn, SPT 08/23/2019, 3:16 PM  [facility] PHYSICAL AND SPORTS MEDICINE [address], [place], [address] Phone: [phone]   Fax:  [phone]  Name: Diana Powell MRN: [id number] Date of Birth: 04-29-1927

## 2019-08-28 ENCOUNTER — Ambulatory Visit: Payer: Medicare Other

## 2019-08-28 ENCOUNTER — Other Ambulatory Visit: Payer: Self-pay

## 2019-08-28 DIAGNOSIS — M25551 Pain in right hip: Secondary | ICD-10-CM

## 2019-08-28 DIAGNOSIS — Z20822 Contact with and (suspected) exposure to covid-19: Secondary | ICD-10-CM | POA: Diagnosis not present

## 2019-08-28 DIAGNOSIS — J449 Chronic obstructive pulmonary disease, unspecified: Secondary | ICD-10-CM | POA: Diagnosis not present

## 2019-08-28 DIAGNOSIS — I083 Combined rheumatic disorders of mitral, aortic and tricuspid valves: Secondary | ICD-10-CM | POA: Diagnosis not present

## 2019-08-28 DIAGNOSIS — M4185 Other forms of scoliosis, thoracolumbar region: Secondary | ICD-10-CM | POA: Diagnosis not present

## 2019-08-28 DIAGNOSIS — Z7901 Long term (current) use of anticoagulants: Secondary | ICD-10-CM | POA: Diagnosis not present

## 2019-08-28 DIAGNOSIS — Z7902 Long term (current) use of antithrombotics/antiplatelets: Secondary | ICD-10-CM | POA: Diagnosis not present

## 2019-08-28 DIAGNOSIS — Z950 Presence of cardiac pacemaker: Secondary | ICD-10-CM | POA: Diagnosis not present

## 2019-08-28 DIAGNOSIS — I482 Chronic atrial fibrillation, unspecified: Secondary | ICD-10-CM | POA: Diagnosis not present

## 2019-08-28 DIAGNOSIS — R262 Difficulty in walking, not elsewhere classified: Secondary | ICD-10-CM

## 2019-08-28 DIAGNOSIS — Z8249 Family history of ischemic heart disease and other diseases of the circulatory system: Secondary | ICD-10-CM | POA: Diagnosis not present

## 2019-08-28 DIAGNOSIS — I5022 Chronic systolic (congestive) heart failure: Secondary | ICD-10-CM | POA: Diagnosis not present

## 2019-08-28 DIAGNOSIS — M1990 Unspecified osteoarthritis, unspecified site: Secondary | ICD-10-CM | POA: Diagnosis not present

## 2019-08-28 DIAGNOSIS — N1832 Chronic kidney disease, stage 3b: Secondary | ICD-10-CM | POA: Diagnosis not present

## 2019-08-28 DIAGNOSIS — J9601 Acute respiratory failure with hypoxia: Secondary | ICD-10-CM | POA: Diagnosis not present

## 2019-08-28 DIAGNOSIS — I480 Paroxysmal atrial fibrillation: Secondary | ICD-10-CM | POA: Diagnosis not present

## 2019-08-28 DIAGNOSIS — Z96641 Presence of right artificial hip joint: Secondary | ICD-10-CM | POA: Diagnosis not present

## 2019-08-28 DIAGNOSIS — Z7951 Long term (current) use of inhaled steroids: Secondary | ICD-10-CM | POA: Diagnosis not present

## 2019-08-28 DIAGNOSIS — K810 Acute cholecystitis: Secondary | ICD-10-CM | POA: Diagnosis not present

## 2019-08-28 DIAGNOSIS — Z823 Family history of stroke: Secondary | ICD-10-CM | POA: Diagnosis not present

## 2019-08-28 DIAGNOSIS — K5900 Constipation, unspecified: Secondary | ICD-10-CM | POA: Diagnosis not present

## 2019-08-28 DIAGNOSIS — M810 Age-related osteoporosis without current pathological fracture: Secondary | ICD-10-CM | POA: Diagnosis not present

## 2019-08-28 DIAGNOSIS — M6281 Muscle weakness (generalized): Secondary | ICD-10-CM

## 2019-08-28 DIAGNOSIS — K8000 Calculus of gallbladder with acute cholecystitis without obstruction: Secondary | ICD-10-CM | POA: Diagnosis not present

## 2019-08-28 DIAGNOSIS — Z79899 Other long term (current) drug therapy: Secondary | ICD-10-CM | POA: Diagnosis not present

## 2019-08-28 NOTE — Therapy (Signed)
[facility] SPORTS MEDIC[facility] [address] [place], [address] Phone: [phone]   Fax:  [phone]  Physical Therapy Treatment  Patient Details  Name: Diana Powell MRN: [id number] Date of Birth: [birth year] Referring Provider (PT): Frazier Butt MD   Encounter Date: 08/28/2019  PT End of Session - 08/28/19 1657    Visit Number  17    Number of Visits  17    Date for PT Re-Evaluation  08/29/19    Authorization Type  7/10    PT Start Time  1430    PT Stop Time  1515    PT Time Calculation (min)  45 min    Equipment Utilized During Treatment  Gait belt    Activity Tolerance  Patient limited by fatigue;Patient tolerated treatment well    Behavior During Therapy  [facility] for tasks assessed/performed       Past Medical History:  Diagnosis Date  . Atrial fibrillation ([place])   . Chronic systolic heart failure ([place])   . COPD (chronic obstructive pulmonary disease) ([place])   . Osteoarthritis, multiple sites   . Osteoporosis, post-menopausal   . Presence of permanent cardiac pacemaker     Past Surgical History:  Procedure Laterality Date  . CARDIOVERSION  ~2009  . HIP FRACTURE SURGERY Bilateral [phone]   each hip fractured at separate times  . INSERT / REPLACE / REMOVE PACEMAKER    . TOTAL HIP ARTHROPLASTY Right 04/20/2018   Procedure: TOTAL HIP CONVERSION;  Surgeon: Hessie Knows, MD;  Location: [facility] ORS;  Service: Orthopedics;  Laterality: Right;    There were no vitals filed for this visit.  Subjective Assessment - 08/28/19 1655    Subjective  Pt arrives with her son who translates for her; he states that the pt is feeling fatigued upon arrival, today is a "low energy" day for her    Patient is accompained by:  Family member    Pertinent History  CVA with perserved EF, self-reported 'Lung condition', R THA, DVT, wrist fx    Limitations  Lifting;Standing;Walking    Patient Stated Goals  To walk around easier         Today's Treatment:  Vitals at start of session: 02  sat 95%, HR 70 bpm  Ambulation: 3 laps (300 ft) with FWW, PT CGA, pt donned gait belt Post-walk vitals: HR 75, O2 94%  Chair squats: 2x8  Seated hip abd with red band around thighs: 5 sec hold x15  Seated hip add with ball between knees: 5 sec hold x15  Standing marches: 10 R and L (b/l UE support)  Heel raises: 2x10 (b/l UE support)  Vitals after standing exercise: 92% and HR 85  Walking at end 5 laps (500 ft) in 5:14   Vitals at end of session 94% and HR 70     PT Education - 08/28/19 1656    Education Details  activity pacing during session    Person(s) Educated  Patient;Spouse    Methods  Explanation;Tactile cues    Comprehension  Verbalized understanding;Need further instruction          PT Long Term Goals - 08/02/19 0854      PT LONG TERM GOAL #1   Title  Patient will be independent with HEP to continue benefits of therapy until after discharge.     Baseline  Dependent with form/technique; Independent with form/technique;    Time  6    Period  Weeks    Status  Achieved      PT LONG TERM GOAL #2   Title  Patient will be able to ambulate over 775ft ith to improve cardiovascular endurance and improve ability to walk in the community.    Baseline  314ft; 05/22/2019: 614ft; 06/19/2019: 533ft; 08/01/2019: Deferred    Time  6    Period  Weeks    Status  On-going      PT LONG TERM GOAL #3   Title  Patient will be able to perform the TUG in under 12 sec to indicate singificant improvement gait speed and dynamic balance with use of FWW    Baseline  27sec; 05/22/2019: 16sec; 06/19/2019: 15.5sec; 08/01/2019: Deferred    Time  6    Period  Weeks    Status  On-going      PT LONG TERM GOAL #4   Title  Patient will improve 5 x STS to under 12sec to decrease fall risk and improve LE functional strength.    Baseline  5xSTS: 27 sec; 05/22/2019: 20 sec; 06/19/2019: 20sec; 08/01/2019: Deferred    Time  6     Period  Weeks    Status  On-going      PT LONG TERM GOAL #5   Title  Patient will improve gait speed to > .63m/s to indicate significant improvement with ability to community ambulate with decreased fall risk    Baseline  .37 m/s; 05/22/2019: .69 m/s; 06/19/2019:  .73 m/s; 08/01/2019: Deferred    Time  6    Period  Weeks    Status  On-going            Plan - 08/28/19 1657    Clinical Impression Statement  Pt's activity tolerance during session limited by her report of shortness of breath/fatigue.  O2 sat never dropped below 92% during session.  She required a seated rest break after standing exercises and after walking in clinic.  Overall she is an appropriate candidate for skilled PT to cont improving activity tolerance and her safety during ambulation.    Personal Factors and Comorbidities  Comorbidity 2    Comorbidities  DVT, THA, CVA, "lung disorder"    Examination-Activity Limitations  Carry;Lift;Locomotion Level;Stand;Stairs    Examination-Participation Restrictions  Cleaning;Community Activity;Meal Prep    Stability/Clinical Decision Making  Evolving/Moderate complexity    Rehab Potential  Fair    Clinical Impairments Affecting Rehab Potential  (+) highly motivated, family support (-) age, coormorbities    PT Frequency  2x / week    PT Duration  6 weeks    PT Treatment/Interventions  Electrical Stimulation;Iontophoresis 4mg /ml Dexamethasone;Aquatic Therapy;Cryotherapy;Moist Heat;Therapeutic activities;Patient/family education;Manual techniques;Balance training;Stair training;Gait training;Neuromuscular re-education;Therapeutic exercise;Passive range of motion    PT Next Visit Plan  Progress strengthening and balance    PT Home Exercise Plan  See education section    Consulted and Agree with Plan of Care  Patient       Patient will benefit from skilled therapeutic intervention in order to improve the following deficits and impairments:  Abnormal gait, Pain, Decreased  coordination, Decreased mobility, Increased muscle spasms, Postural dysfunction, Decreased endurance, Decreased range of motion, Decreased strength, Decreased balance, Difficulty walking, Decreased activity tolerance  Visit Diagnosis: Pain in right hip  Difficulty in walking, not elsewhere classified  Muscle weakness (generalized)     Problem List Patient Active Problem List   Diagnosis Date Noted  . Acute DVT (deep venous thrombosis) (HCC) 04/29/2018  . 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 Dementia  due to medical condition without behavioral disturbance (HCC) 05/04/2013  . Atrial fibrillation (HCC)   . Chronic systolic heart failure (HCC)   . Osteoporosis, post-menopausal   . Osteoarthritis, multiple sites     Ardine Bjork 08/28/2019, 5:00 PM  Max Fickle, PT, DPT   [facility] [facility] PHYSICAL AND SPORTS MEDICINE [address] [place], [address] Phone: [phone]   Fax:  [phone]  Name: Diana Powell MRN: [id number] Date of Birth: 03-Nov-1926

## 2019-08-31 ENCOUNTER — Emergency Department: Payer: Medicare Other

## 2019-08-31 ENCOUNTER — Inpatient Hospital Stay
Admission: EM | Admit: 2019-08-31 | Discharge: 2019-09-05 | DRG: 444 | Disposition: A | Discharge status: Home / Self Care | Payer: Medicare Other | Attending: Hospitalist | Admitting: Hospitalist

## 2019-08-31 ENCOUNTER — Other Ambulatory Visit: Payer: Self-pay

## 2019-08-31 ENCOUNTER — Encounter: Payer: Self-pay | Admitting: Emergency Medicine

## 2019-08-31 DIAGNOSIS — I083 Combined rheumatic disorders of mitral, aortic and tricuspid valves: Secondary | ICD-10-CM | POA: Diagnosis present

## 2019-08-31 DIAGNOSIS — K810 Acute cholecystitis: Secondary | ICD-10-CM | POA: Diagnosis present

## 2019-08-31 DIAGNOSIS — Z8249 Family history of ischemic heart disease and other diseases of the circulatory system: Secondary | ICD-10-CM | POA: Diagnosis not present

## 2019-08-31 DIAGNOSIS — J449 Chronic obstructive pulmonary disease, unspecified: Secondary | ICD-10-CM | POA: Diagnosis present

## 2019-08-31 DIAGNOSIS — Z96641 Presence of right artificial hip joint: Secondary | ICD-10-CM | POA: Diagnosis present

## 2019-08-31 DIAGNOSIS — R1011 Right upper quadrant pain: Secondary | ICD-10-CM

## 2019-08-31 DIAGNOSIS — Z7902 Long term (current) use of antithrombotics/antiplatelets: Secondary | ICD-10-CM | POA: Diagnosis not present

## 2019-08-31 DIAGNOSIS — Z823 Family history of stroke: Secondary | ICD-10-CM

## 2019-08-31 DIAGNOSIS — I482 Chronic atrial fibrillation, unspecified: Secondary | ICD-10-CM | POA: Diagnosis present

## 2019-08-31 DIAGNOSIS — I5022 Chronic systolic (congestive) heart failure: Secondary | ICD-10-CM | POA: Diagnosis not present

## 2019-08-31 DIAGNOSIS — Z20822 Contact with and (suspected) exposure to covid-19: Secondary | ICD-10-CM | POA: Diagnosis present

## 2019-08-31 DIAGNOSIS — N1832 Chronic kidney disease, stage 3b: Secondary | ICD-10-CM | POA: Diagnosis present

## 2019-08-31 DIAGNOSIS — Z950 Presence of cardiac pacemaker: Secondary | ICD-10-CM

## 2019-08-31 DIAGNOSIS — K8000 Calculus of gallbladder with acute cholecystitis without obstruction: Secondary | ICD-10-CM | POA: Diagnosis present

## 2019-08-31 DIAGNOSIS — Z7951 Long term (current) use of inhaled steroids: Secondary | ICD-10-CM | POA: Diagnosis not present

## 2019-08-31 DIAGNOSIS — J9601 Acute respiratory failure with hypoxia: Secondary | ICD-10-CM | POA: Diagnosis not present

## 2019-08-31 DIAGNOSIS — Z79899 Other long term (current) drug therapy: Secondary | ICD-10-CM | POA: Diagnosis not present

## 2019-08-31 DIAGNOSIS — N179 Acute kidney failure, unspecified: Secondary | ICD-10-CM

## 2019-08-31 DIAGNOSIS — R0989 Other specified symptoms and signs involving the circulatory and respiratory systems: Secondary | ICD-10-CM

## 2019-08-31 DIAGNOSIS — I480 Paroxysmal atrial fibrillation: Secondary | ICD-10-CM

## 2019-08-31 DIAGNOSIS — Z7901 Long term (current) use of anticoagulants: Secondary | ICD-10-CM

## 2019-08-31 DIAGNOSIS — N189 Chronic kidney disease, unspecified: Secondary | ICD-10-CM | POA: Diagnosis not present

## 2019-08-31 DIAGNOSIS — M810 Age-related osteoporosis without current pathological fracture: Secondary | ICD-10-CM | POA: Diagnosis present

## 2019-08-31 DIAGNOSIS — R109 Unspecified abdominal pain: Secondary | ICD-10-CM

## 2019-08-31 DIAGNOSIS — M1990 Unspecified osteoarthritis, unspecified site: Secondary | ICD-10-CM | POA: Diagnosis present

## 2019-08-31 DIAGNOSIS — M4185 Other forms of scoliosis, thoracolumbar region: Secondary | ICD-10-CM | POA: Diagnosis present

## 2019-08-31 DIAGNOSIS — K5900 Constipation, unspecified: Secondary | ICD-10-CM | POA: Diagnosis present

## 2019-08-31 LAB — COMPREHENSIVE METABOLIC PANEL
ALT: 14 U/L (ref 0–44)
AST: 22 U/L (ref 15–41)
Albumin: 3.9 g/dL (ref 3.5–5.0)
Alkaline Phosphatase: 68 U/L (ref 38–126)
Anion gap: 13 (ref 5–15)
BUN: 16 mg/dL (ref 8–23)
CO2: 31 mmol/L (ref 22–32)
Calcium: 9.1 mg/dL (ref 8.9–10.3)
Chloride: 98 mmol/L (ref 98–111)
Creatinine, Ser: 0.99 mg/dL (ref 0.44–1.00)
GFR calc Af Amer: 58 mL/min — ABNORMAL LOW (ref >60)
GFR calc non Af Amer: 50 mL/min — ABNORMAL LOW (ref >60)
Glucose, Bld: 131 mg/dL — ABNORMAL HIGH (ref 70–99)
Potassium: 3.6 mmol/L (ref 3.5–5.1)
Sodium: 142 mmol/L (ref 135–145)
Total Bilirubin: 1 mg/dL (ref 0.3–1.2)
Total Protein: 7.4 g/dL (ref 6.5–8.1)

## 2019-08-31 LAB — URINALYSIS, COMPLETE (UACMP) WITH MICROSCOPIC
Bacteria, UA: NONE SEEN
Bilirubin Urine: NEGATIVE
Glucose, UA: NEGATIVE mg/dL
Ketones, ur: NEGATIVE mg/dL
Nitrite: NEGATIVE
Protein, ur: NEGATIVE mg/dL
Specific Gravity, Urine: 1.03 (ref 1.005–1.030)
Squamous Epithelial / HPF: NONE SEEN (ref 0–5)
pH: 6 (ref 5.0–8.0)

## 2019-08-31 LAB — CBC
HCT: 39.6 % (ref 36.0–46.0)
Hemoglobin: 12.4 g/dL (ref 12.0–15.0)
MCH: 29.4 pg (ref 26.0–34.0)
MCHC: 31.3 g/dL (ref 30.0–36.0)
MCV: 93.8 fL (ref 80.0–100.0)
Platelets: 206 10*3/uL (ref 150–400)
RBC: 4.22 MIL/uL (ref 3.87–5.11)
RDW: 12.6 % (ref 11.5–15.5)
WBC: 7.4 10*3/uL (ref 4.0–10.5)
nRBC: 0 % (ref 0.0–0.2)

## 2019-08-31 LAB — APTT: aPTT: 33 seconds (ref 24–36)

## 2019-08-31 LAB — HEPARIN LEVEL (UNFRACTIONATED): Heparin Unfractionated: >3.6 IU/mL — ABNORMAL HIGH (ref 0.30–0.70)

## 2019-08-31 LAB — LIPASE, BLOOD: Lipase: 28 U/L (ref 11–51)

## 2019-08-31 LAB — RESPIRATORY PANEL BY RT PCR (FLU A&B, COVID)
Influenza A by PCR: NEGATIVE
Influenza B by PCR: NEGATIVE
SARS Coronavirus 2 by RT PCR: NEGATIVE

## 2019-08-31 LAB — PROTIME-INR
INR: 1.3 — ABNORMAL HIGH (ref 0.8–1.2)
Prothrombin Time: 16.5 seconds — ABNORMAL HIGH (ref 11.4–15.2)

## 2019-08-31 MED ORDER — SODIUM CHLORIDE 0.9 % IV SOLN: Freq: Once | INTRAVENOUS | Status: AC

## 2019-08-31 MED ORDER — SODIUM CHLORIDE 0.9 % IV SOLN: INTRAVENOUS | Status: DC

## 2019-08-31 MED ORDER — ACETAMINOPHEN 500 MG PO TABS
500.0000 mg | ORAL_TABLET | Freq: Two times a day (BID) | ORAL | Status: DC
  Administered 2019-08-31 – 2019-09-01 (×3): 500 mg via ORAL
  Filled 2019-08-31 (×3): qty 1

## 2019-08-31 MED ORDER — ARFORMOTEROL TARTRATE 15 MCG/2ML IN NEBU
15.0000 ug | INHALATION_SOLUTION | Freq: Two times a day (BID) | RESPIRATORY_TRACT | Status: DC
  Administered 2019-08-31 – 2019-09-05 (×10): 15 ug via RESPIRATORY_TRACT
  Filled 2019-08-31 (×11): qty 2

## 2019-08-31 MED ORDER — MORPHINE SULFATE (PF) 4 MG/ML IV SOLN
4.0000 mg | Freq: Once | INTRAVENOUS | Status: AC
  Administered 2019-08-31: 4 mg via INTRAVENOUS
  Filled 2019-08-31: qty 1

## 2019-08-31 MED ORDER — POTASSIUM CHLORIDE ER 10 MEQ PO TBCR: 10.0000 meq | EXTENDED_RELEASE_TABLET | Freq: Every day | ORAL | Status: DC

## 2019-08-31 MED ORDER — ONDANSETRON HCL 4 MG PO TABS: 4.0000 mg | ORAL_TABLET | Freq: Four times a day (QID) | ORAL | Status: DC | PRN

## 2019-08-31 MED ORDER — HEPARIN (PORCINE) 25000 UT/250ML-% IV SOLN
1100.0000 [IU]/h | INTRAVENOUS | Status: DC
  Administered 2019-08-31 – 2019-09-03 (×3): 950 [IU]/h via INTRAVENOUS
  Filled 2019-08-31 (×3): qty 250

## 2019-08-31 MED ORDER — OXYCODONE HCL 5 MG PO TABS
5.0000 mg | ORAL_TABLET | ORAL | Status: DC | PRN
  Administered 2019-08-31 – 2019-09-01 (×2): 5 mg via ORAL
  Filled 2019-08-31 (×2): qty 1

## 2019-08-31 MED ORDER — IOHEXOL 300 MG/ML  SOLN
75.0000 mL | Freq: Once | INTRAMUSCULAR | Status: AC | PRN
  Administered 2019-08-31: 75 mL via INTRAVENOUS

## 2019-08-31 MED ORDER — BUDESONIDE 0.5 MG/2ML IN SUSP
0.5000 mg | Freq: Every day | RESPIRATORY_TRACT | Status: DC
  Administered 2019-08-31: 0.5 mg via RESPIRATORY_TRACT
  Filled 2019-08-31 (×2): qty 2

## 2019-08-31 MED ORDER — FUROSEMIDE 40 MG PO TABS
40.0000 mg | ORAL_TABLET | Freq: Two times a day (BID) | ORAL | Status: DC
  Administered 2019-09-01: 40 mg via ORAL
  Filled 2019-08-31 (×2): qty 1

## 2019-08-31 MED ORDER — ONDANSETRON HCL 4 MG/2ML IJ SOLN: 4.0000 mg | Freq: Four times a day (QID) | INTRAMUSCULAR | Status: DC | PRN

## 2019-08-31 MED ORDER — VITAMIN B-12 1000 MCG PO TABS
1000.0000 ug | ORAL_TABLET | Freq: Every day | ORAL | Status: DC
  Administered 2019-09-01 – 2019-09-05 (×2): 1000 ug via ORAL
  Filled 2019-08-31 (×2): qty 1

## 2019-08-31 MED ORDER — PIPERACILLIN-TAZOBACTAM 3.375 G IVPB 30 MIN
3.3750 g | Freq: Once | INTRAVENOUS | Status: AC
  Administered 2019-08-31: 3.375 g via INTRAVENOUS
  Filled 2019-08-31: qty 50

## 2019-08-31 MED ORDER — POTASSIUM CHLORIDE CRYS ER 10 MEQ PO TBCR
10.0000 meq | EXTENDED_RELEASE_TABLET | Freq: Every day | ORAL | Status: DC
  Administered 2019-09-01: 10 meq via ORAL
  Filled 2019-08-31: qty 1

## 2019-08-31 MED ORDER — PIPERACILLIN-TAZOBACTAM 3.375 G IVPB
3.3750 g | Freq: Three times a day (TID) | INTRAVENOUS | Status: DC
  Administered 2019-08-31 – 2019-09-05 (×14): 3.375 g via INTRAVENOUS
  Filled 2019-08-31 (×14): qty 50

## 2019-08-31 MED ORDER — IPRATROPIUM-ALBUTEROL 0.5-2.5 (3) MG/3ML IN SOLN: 3.0000 mL | Freq: Three times a day (TID) | RESPIRATORY_TRACT | Status: DC | PRN

## 2019-08-31 MED ORDER — ONDANSETRON HCL 4 MG/2ML IJ SOLN
4.0000 mg | Freq: Once | INTRAMUSCULAR | Status: AC
  Administered 2019-08-31: 4 mg via INTRAVENOUS
  Filled 2019-08-31: qty 2

## 2019-08-31 NOTE — Consult Note (Signed)
Pharmacy Antibiotic Note  Diana Powell is a 86 y.o. female admitted on 08/31/2019 with intra-abdominal infection.  Pharmacy has been consulted for pip/tazo dosing.  Plan: Zosyn 3.375g IV q8h (4 hour infusion).  Height: 5\' 3"  (160 cm) Weight: 180 lb (81.6 kg) IBW/kg (Calculated) : 52.4  Temp (24hrs), Avg:97.5 F (36.4 C), Min:97.5 F (36.4 C), Max:97.5 F (36.4 C)  Recent Labs  Lab 08/31/19 1103  WBC 7.4  CREATININE 0.99    Estimated Creatinine Clearance: 38.2 mL/min (by C-G formula based on SCr of 0.99 mg/dL).    No Known Allergies  Microbiology results: None  Thank you for allowing pharmacy to be a part of this patient's care.  10/29/19, PharmD, BCPS 08/31/2019 4:05 PM

## 2019-08-31 NOTE — ED Notes (Signed)
Pt SpO2 85% on RA. Pt placed on 2L , SpO2 96%.  Call bell within reach, stretcher locked in lowest position

## 2019-08-31 NOTE — Consult Note (Signed)
ANTICOAGULATION CONSULT NOTE  Pharmacy Consult for Heparin Indication: atrial fibrillation  No Known Allergies  Patient Measurements: Height: 5\' 3"  (160 cm) Weight: 180 lb (81.6 kg) IBW/kg (Calculated) : 52.4 Heparin Dosing Weight: 70.3 kg  Vital Signs: Temp: 97.5 F (36.4 C) (02/12 1058) Temp Source: Oral (02/12 1058) BP: 155/70 (02/12 1200) Pulse Rate: 70 (02/12 1200)  Labs: Recent Labs    08/31/19 1103  HGB 12.4  HCT 39.6  PLT 206  CREATININE 0.99    Estimated Creatinine Clearance: 38.2 mL/min (by C-G formula based on SCr of 0.99 mg/dL).   Medical History: Past Medical History:  Diagnosis Date  . Atrial fibrillation (HCC)   . Chronic systolic heart failure (HCC)   . COPD (chronic obstructive pulmonary disease) (HCC)   . Osteoarthritis, multiple sites   . Osteoporosis, post-menopausal   . Presence of permanent cardiac pacemaker     Medications:  (Not in a hospital admission)  Scheduled:  . acetaminophen  500 mg Oral BID  . arformoterol  15 mcg Nebulization Q12H  . budesonide  0.5 mg Nebulization Daily  . furosemide  40 mg Oral BID  . [START ON 09/01/2019] potassium chloride  10 mEq Oral Daily  . [START ON 09/01/2019] vitamin B-12  1,000 mcg Oral Daily   Infusions:  . sodium chloride    . piperacillin-tazobactam (ZOSYN)  IV     PRN: ipratropium-albuterol, ondansetron **OR** ondansetron (ZOFRAN) IV, oxyCODONE Anti-infectives (From admission, onward)   Start     Dose/Rate Route Frequency Ordered Stop   08/31/19 2200  piperacillin-tazobactam (ZOSYN) IVPB 3.375 g     3.375 g 12.5 mL/hr over 240 Minutes Intravenous Every 8 hours 08/31/19 1605     08/31/19 1330  piperacillin-tazobactam (ZOSYN) IVPB 3.375 g     3.375 g 100 mL/hr over 30 Minutes Intravenous  Once 08/31/19 1315 08/31/19 1503      Assessment: Pharmacy consulted to start heparin for afib. Pt was previously on apixaban. Will order baseline APTT, INR, and heparin level. Most likely use APTT  for monitor if heparin level is falsely elevated.   Goal of Therapy:  Heparin level 0.3-0.7 units/ml Monitor platelets by anticoagulation protocol: Yes   Plan:  No bolus with be given.  Start heparin infusion at 950 units/hr Check anti-Xa level in 8 hours and daily while on heparin Continue to monitor H&H and platelets  10/29/19, PharmD, BCPS 08/31/2019,4:10 PM

## 2019-08-31 NOTE — ED Notes (Signed)
Pt cleaned and brief changed

## 2019-08-31 NOTE — ED Provider Notes (Signed)
[facility] Emergency Department Provider Note  ____________________________________________   First MD Initiated Contact with Patient 08/31/19 1132     (approximate)  I have reviewed the triage vital signs and the nursing notes.   HISTORY  Chief Complaint Abdominal Pain    HPI Diana Powell is a 86 y.o. female with history of A. fib, DVT, on Xarelto, CHF, here with abd pain. History provided primarily by son as pt speaks [place] only and declines interpreter. Pt reports acute onset of severe RUQ pain yesterday, present throughout day. Pain is aching, gnawing, worse w/ palpation and eating. Feels like it is just under her rib cage. No fevers. +nausea, but no vomiting. No diarrhea or constipation. There is a question of h/o gallstones though son does not recall this. No recent sick contacts.       Past Medical History:  Diagnosis Date  . Atrial fibrillation (HCC)   . Chronic systolic heart failure (HCC)   . COPD (chronic obstructive pulmonary disease) (HCC)   . Osteoarthritis, multiple sites   . Osteoporosis, post-menopausal   . Presence of permanent cardiac pacemaker     Patient Active Problem List   Diagnosis Date Noted  . Acute DVT (deep venous thrombosis) (HCC) 04/29/2018  . 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 Atrial fibrillation (HCC)   . Chronic systolic heart failure (HCC)   . Osteoporosis, post-menopausal   . Osteoarthritis, multiple sites     Past Surgical History:  Procedure Laterality Date  . CARDIOVERSION  ~2009  . HIP FRACTURE SURGERY Bilateral [id number]   each hip fractured at separate times  . INSERT / REPLACE / REMOVE PACEMAKER    . TOTAL HIP ARTHROPLASTY Right 04/20/2018   Procedure: TOTAL HIP CONVERSION;  Surgeon: Kennedy Bucker, MD;  Location: [facility] ORS;  Service: Orthopedics;  Laterality: Right;    Prior to Admission  medications   Medication Sig Start Date End Date Taking? Authorizing Provider  acetaminophen (TYLENOL) 500 MG tablet Take 1,000 mg by mouth every 8 (eight) hours as needed for moderate pain.     [provider]  amLODipine (NORVASC) 5 MG tablet Take 5 mg by mouth daily. 02/09/18   [provider]  apixaban (ELIQUIS) 5 MG TABS tablet Take 2 tablets (10 mg total) by mouth 2 (two) times daily for 7 days. Take apixaban 10 mg twice daily for 7 days followed by 5 mg p.o. twice daily after that. 05/01/18 05/08/18  Katha Hamming, MD  budesonide (PULMICORT) 0.5 MG/2ML nebulizer solution Take 0.5 mg by nebulization daily. 01/23/18   [provider]  Calcium Carbonate-Vitamin D (CALCIUM-VITAMIN D) 500-200 MG-UNIT per tablet Take 1 tablet by mouth daily.    [provider]  ferrous sulfate 325 (65 FE) MG tablet Take 325 mg by mouth daily with breakfast.    [provider]  formoterol (PERFOROMIST) 20 MCG/2ML nebulizer solution Inhale 20 mcg into the lungs every 12 (twelve) hours. 11/30/17   [provider]  furosemide (LASIX) 20 MG tablet Take 20 mg by mouth daily. nORMALLY 40MG  IN THE MORNING AND 20 MG IN THE EVENING AS NEEDED ONLY    [provider]  HYDROcodone-acetaminophen (NORCO/VICODIN) 5-325 MG tablet Take 1 tablet by mouth every 8 (eight) hours as needed for moderate pain (pain score 4-6). 04/25/18   06/25/18, PA-C  ipratropium-albuterol (DUONEB) 0.5-2.5 (3) MG/3ML SOLN Inhale 3 mLs into the lungs  every 8 (eight) hours as needed. 08/24/19   [provider]  potassium chloride (K-DUR) 10 MEQ tablet Take 10 mEq by mouth daily. 02/18/18   [provider]  traZODone (DESYREL) 50 MG tablet Take 1 tablet (50 mg total) by mouth at bedtime as needed for sleep. 05/01/18   Katha Hamming, MD  vitamin B-12 (CYANOCOBALAMIN) 1000 MCG tablet Take 1,000 mcg by mouth daily.    [provider]    Allergies Patient has  no known allergies.  History reviewed. No pertinent family history.  Social History Social History   Tobacco Use  . Smoking status: Never Smoker  . Smokeless tobacco: Never Used  Substance Use Topics  . Alcohol use: Not Currently  . Drug use: No    Review of Systems  Review of Systems  Unable to perform ROS: Other  Constitutional: Positive for fatigue. Negative for chills and fever.  HENT: Negative for sore throat.   Respiratory: Negative for shortness of breath.   Cardiovascular: Negative for chest pain.  Gastrointestinal: Positive for abdominal pain, nausea and vomiting.  Genitourinary: Negative for flank pain.  Musculoskeletal: Negative for neck pain.  Skin: Negative for rash and wound.  Allergic/Immunologic: Negative for immunocompromised state.  Neurological: Positive for weakness. Negative for numbness.  Hematological: Does not bruise/bleed easily.     ____________________________________________  PHYSICAL EXAM:      VITAL SIGNS: ED Triage Vitals  Enc Vitals Group     BP 08/31/19 1058 (!) 186/71     Pulse Rate 08/31/19 1058 70     Resp 08/31/19 1058 18     Temp 08/31/19 1058 (!) 97.5 F (36.4 C)     Temp Source 08/31/19 1058 Oral     SpO2 08/31/19 1058 98 %     Weight 08/31/19 1100 180 lb (81.6 kg)     Height 08/31/19 1100 5\' 3"  (1.6 m)     Head Circumference --      Peak Flow --      Pain Score 08/31/19 1251 10     Pain Loc --      Pain Edu? --      Excl. in GC? --      Physical Exam Vitals and nursing note reviewed.  Constitutional:      General: She is not in acute distress.    Appearance: She is well-developed.  HENT:     Head: Normocephalic and atraumatic.  Eyes:     Conjunctiva/sclera: Conjunctivae normal.  Cardiovascular:     Rate and Rhythm: Normal rate and regular rhythm.     Heart sounds: Normal heart sounds.  Pulmonary:     Effort: Pulmonary effort is normal. No respiratory distress.     Breath sounds: No wheezing.  Abdominal:      General: There is no distension.     Tenderness: There is abdominal tenderness in the right upper quadrant. There is guarding. There is no rebound. Positive signs include Murphy's sign.  Musculoskeletal:     Cervical back: Neck supple.  Skin:    General: Skin is warm.     Capillary Refill: Capillary refill takes less than 2 seconds.     Findings: No rash.  Neurological:     Mental Status: She is alert and oriented to person, place, and time.     Motor: No abnormal muscle tone.       ____________________________________________   LABS (all labs ordered are listed, but only abnormal results are displayed)  Labs Reviewed  COMPREHENSIVE  METABOLIC PANEL - Abnormal; Notable for the following components:      Result Value   Glucose, Bld 131 (*)    GFR calc non Af Amer 50 (*)    GFR calc Af Amer 58 (*)    All other components within normal limits  LIPASE, BLOOD  CBC  URINALYSIS, COMPLETE (UACMP) WITH MICROSCOPIC    ____________________________________________  EKG: none ________________________________________  RADIOLOGY All imaging, including plain films, CT scans, and ultrasounds, independently reviewed by me, and interpretations confirmed via formal radiology reads.  ED MD interpretation:   CT A/P: Acute cholecystitis, 4 cm gallstone U/S: +gallstones without acute cholecystitis  Official radiology report(s): CT ABDOMEN PELVIS W CONTRAST  Result Date: 08/31/2019 CLINICAL DATA:  Onset right upper quadrant pain last night. EXAM: CT ABDOMEN AND PELVIS WITH CONTRAST TECHNIQUE: Multidetector CT imaging of the abdomen and pelvis was performed using the standard protocol following bolus administration of intravenous contrast. CONTRAST:  75 mL OMNIPAQUE IOHEXOL 300 MG/ML  SOLN COMPARISON:  None. FINDINGS: Lower chest: Lung bases clear. There is cardiomegaly. No pleural or pericardial effusion. Hepatobiliary: A 4 cm in diameter lamellated stone is seen in the gallbladder. The  gallbladder is dilated and its wall appears mildly thickened. The liver and biliary tree are normal in appearance. Pancreas: Unremarkable. No pancreatic ductal dilatation or surrounding inflammatory changes. Spleen: Normal in size without focal abnormality. Adrenals/Urinary Tract: Adrenal glands are unremarkable. Kidneys are normal, without renal calculi, focal lesion, or hydronephrosis. Bladder is unremarkable. Stomach/Bowel: Stomach is within normal limits. Appendix appears normal. No evidence of bowel wall thickening, distention, or inflammatory changes. Vascular/Lymphatic: Aortic atherosclerosis. No enlarged abdominal or pelvic lymph nodes. Reproductive: Uterus and bilateral adnexa are unremarkable. Other: None. Musculoskeletal: There is severe convex left thoracolumbar scoliosis and multilevel degenerative change. The patient is status post right hip replacement. No acute or focal abnormality. IMPRESSION: 4 cm gallstone with gallbladder wall thickening and dilatation worrisome for acute cholecystitis. Recommend right upper quadrant ultrasound for further evaluation. Atherosclerosis. Scoliosis and multilevel degenerative disease. Electronically Signed   By: Drusilla Kanner M.D.   On: 08/31/2019 12:56   US Abdomen Limited RUQ  Result Date: 08/31/2019 CLINICAL DATA:  Right upper quadrant abdominal pain. EXAM: ULTRASOUND ABDOMEN LIMITED RIGHT UPPER QUADRANT COMPARISON:  None. FINDINGS: Gallbladder: 4 cm gallstone is noted. No significant gallbladder wall thickening or pericholecystic fluid is noted. Positive sonographic Murphy's sign is noted. Common bile duct: Diameter: 3 mm which is within normal limits. Liver: No focal lesion identified. Within normal limits in parenchymal echogenicity. Portal vein is patent on color Doppler imaging with normal direction of blood flow towards the liver. Other: None. IMPRESSION: Large solitary gallstone is noted. No gallbladder wall thickening or pericholecystic fluid is  noted, but positive sonographic Murphy's sign is noted. If there is concern for cholecystitis, HIDA scan is recommended for further evaluation. Electronically Signed   By: Lupita Raider M.D.   On: 08/31/2019 14:19    ____________________________________________  PROCEDURES   Procedure(s) performed (including Critical Care):  Procedures  ____________________________________________  INITIAL IMPRESSION / MDM / ASSESSMENT AND PLAN / ED COURSE  As part of my medical decision making, I reviewed the following data within the electronic MEDICAL RECORD NUMBER Nursing notes reviewed and incorporated, Old chart reviewed, Notes from prior ED visits, and [place] Controlled Substance Database       *Vetta Couzens was evaluated in Emergency Department on 08/31/2019 for the symptoms described in the history of present illness. She was evaluated in the  context of the global COVID-19 pandemic, which necessitated consideration that the patient might be at risk for infection with the SARS-CoV-2 virus that causes COVID-19. Institutional protocols and algorithms that pertain to the evaluation of patients at risk for COVID-19 are in a state of rapid change based on information released by regulatory bodies including the CDC and federal and state organizations. These policies and algorithms were followed during the patient's care in the ED.  Some ED evaluations and interventions may be delayed as a result of limited staffing during the pandemic.*     Medical Decision Making:  86 yo F here with RUQ pain. Exam is c/w acute cholecystitis, confirmed on CT. However, follow-up U/S shows no GB wall thickening. Labs are reassuring with normal WBC, LFTs, and Bili. Lipase normal as well. Dr. Hampton Abbot consulted and has evaluated, requests IV ABX ,medicine admission for pre-op evaluation/clearance as well as treatment. Will need to hold anticoagulant.  ____________________________________________  FINAL CLINICAL IMPRESSION(S) / ED  DIAGNOSES  Final diagnoses:  RUQ pain     MEDICATIONS GIVEN DURING THIS VISIT:  Medications  0.9 %  sodium chloride infusion ( Intravenous Stopped 08/31/19 1503)  morphine 4 MG/ML injection 4 mg (4 mg Intravenous Given 08/31/19 1250)  ondansetron (ZOFRAN) injection 4 mg (4 mg Intravenous Given 08/31/19 1249)  iohexol (OMNIPAQUE) 300 MG/ML solution 75 mL (75 mLs Intravenous Contrast Given 08/31/19 1237)  piperacillin-tazobactam (ZOSYN) IVPB 3.375 g (0 g Intravenous Stopped 08/31/19 1503)     ED Discharge Orders    None       Note:  This document was prepared using Dragon voice recognition software and may include unintentional dictation errors.   Duffy Bruce, MD 08/31/19 [phone]

## 2019-08-31 NOTE — Progress Notes (Addendum)
Per son Trinna Post) pt. typically gets confused when she's admitted to hospital. So son will be at bedside for this whole admission. [facility] supervisor today has been notified and permitted the son to stay overnight during this admission. Pt speaks complete [place] language. Per ED RN report pt. refused to use hospital interpreter and wants to use "son" as her interpreter.Primary RN at night updated.

## 2019-08-31 NOTE — ED Triage Notes (Signed)
Pt arrives via POV with son c/o RUQ abd pain starting last night. Pt's primary language is [place], does not speak any [place]. Son denies previous hx liver problems, states has had "gallbladder infection" before but did not have surgery.

## 2019-08-31 NOTE — Consult Note (Signed)
Date of Consultation:  08/31/2019  Requesting Physician:  Shaune Pollack, MD  Reason for Consultation:  RUQ pain  History of Present Illness: Diana Powell is a 86 y.o. female presenting for evaluation of epigastric and RUQ pain.  The patient is [place] speaker, and her son is her interpreter at her request.  She started having pain last night, after eating a home cooked meal.  The son reports there was nothing fried or greasy.  Her pain was mostly initially in the epigastric area, but now she reports it's worse in the RUQ instead.  Associated with nausea and dry heaving.  Normal bowel function.  No fevers or chills.  She has had intermittent issues with epigastric discomfort, but not to the severity of this episode which brings her to the ER.    In the ED, her laboratory workup is overall normal with normal WBC and Hgb, normal electrolytes, and normal LFTs.  She had a CT scan which showed a distended gallbladder with a 4 cm gallstone.  U/S was also performed which showed the same, with no gallbladder wall thickening and no pericholecystic fluid.    She has a history of CHF, with echo in 2018 showing EF > 55% and is on Lasix 40 mg BID.  She has a proBNP from 03/2019 of 2,070.  She has a history of COPD/asthma, as is on two inhalers.  She has atrial fibrillation and is on Eliquis for this and has a pacemaker in place. She did take her Eliquis this morning.  She had a right hip surgery in 04/2018 and is still on physical therapy for this and uses a walker to ambulate and had a post-operative DVT.  She has shortness of breath on exertion, but her physical therapy has been progressing.  In the ED, her O2 saturation did decrease to 85% and was started on [place].  No prior abdominal surgical history.  Past Medical History: Past Medical History:  Diagnosis Date  . Atrial fibrillation (HCC)   . Chronic systolic heart failure (HCC)   . COPD (chronic obstructive pulmonary disease) (HCC)   . Osteoarthritis,  multiple sites   . Osteoporosis, post-menopausal   . Presence of permanent cardiac pacemaker      Past Surgical History: Past Surgical History:  Procedure Laterality Date  . CARDIOVERSION  ~2009  . HIP FRACTURE SURGERY Bilateral [phone]   each hip fractured at separate times  . INSERT / REPLACE / REMOVE PACEMAKER    . TOTAL HIP ARTHROPLASTY Right 04/20/2018   Procedure: TOTAL HIP CONVERSION;  Surgeon: Kennedy Bucker, MD;  Location: [facility] ORS;  Service: Orthopedics;  Laterality: Right;    Home Medications: Prior to Admission medications   Medication Sig Start Date End Date Taking? Authorizing Provider  acetaminophen (TYLENOL) 500 MG tablet Take 1,000 mg by mouth every 8 (eight) hours as needed for moderate pain.     [provider]  amLODipine (NORVASC) 5 MG tablet Take 5 mg by mouth daily. 02/09/18   [provider]  apixaban (ELIQUIS) 5 MG TABS tablet Take 2 tablets (10 mg total) by mouth 2 (two) times daily for 7 days. Take apixaban 10 mg twice daily for 7 days followed by 5 mg p.o. twice daily after that. 05/01/18 05/08/18  Katha Hamming, MD  budesonide (PULMICORT) 0.5 MG/2ML nebulizer solution Take 0.5 mg by nebulization daily. 01/23/18   [provider]  Calcium Carbonate-Vitamin D (CALCIUM-VITAMIN D) 500-200 MG-UNIT per tablet Take 1 tablet by mouth daily.  [provider]  ferrous sulfate 325 (65 FE) MG tablet Take 325 mg by mouth daily with breakfast.    [provider]  formoterol (PERFOROMIST) 20 MCG/2ML nebulizer solution Inhale 20 mcg into the lungs every 12 (twelve) hours. 11/30/17   [provider]  furosemide (LASIX) 20 MG tablet Take 20 mg by mouth daily. nORMALLY 40MG  IN THE MORNING AND 20 MG IN THE EVENING AS NEEDED ONLY    [provider]  HYDROcodone-acetaminophen (NORCO/VICODIN) 5-325 MG tablet Take 1 tablet by mouth every 8 (eight) hours as needed for moderate pain (pain score 4-6). 04/25/18   Duanne Guess, PA-C  ipratropium-albuterol (DUONEB) 0.5-2.5 (3) MG/3ML SOLN Inhale 3 mLs into the lungs every 8 (eight) hours as needed. 08/24/19   [provider]  potassium chloride (K-DUR) 10 MEQ tablet Take 10 mEq by mouth daily. 02/18/18   [provider]  traZODone (DESYREL) 50 MG tablet Take 1 tablet (50 mg total) by mouth at bedtime as needed for sleep. 05/01/18   Epifanio Lesches, MD  vitamin B-12 (CYANOCOBALAMIN) 1000 MCG tablet Take 1,000 mcg by mouth daily.    [provider]    Allergies: No Known Allergies  Social History:  reports that she has never smoked. She has never used smokeless tobacco. She reports previous alcohol use. She reports that she does not use drugs.   Family History: History reviewed. No pertinent family history.  Review of Systems: Review of Systems  Constitutional: Negative for chills and fever.  HENT: Negative for hearing loss.   Eyes: Negative for blurred vision.  Respiratory: Positive for shortness of breath.   Cardiovascular: Negative for chest pain.  Gastrointestinal: Positive for abdominal pain and nausea. Negative for constipation, diarrhea and vomiting.  Genitourinary: Negative for dysuria.  Musculoskeletal: Negative for myalgias.  Skin: Negative for rash.  Neurological: Negative for dizziness.  Psychiatric/Behavioral: Negative for depression.    Physical Exam BP (!) 155/70   Pulse 70   Temp (!) 97.5 F (36.4 C) (Oral)   Resp 18   Ht 5\' 3"  (1.6 m)   Wt 81.6 kg   SpO2 96%   BMI 31.89 kg/m  CONSTITUTIONAL: No acute distress HEENT:  Normocephalic, atraumatic, extraocular motion intact. NECK: Trachea is midline, and there is no jugular venous distension. RESPIRATORY:  Lungs are clear, and breath sounds are equal bilaterally. Normal respiratory effort without pathologic use of accessory muscles. CARDIOVASCULAR: Heart is regular without murmurs, gallops, or rubs. GI: The abdomen is soft, non-distended, with  tenderness to palpation in the RUQ mostly, but also in the epigastric area.  Equivocal Murphy's sign, as she had received pain medication.  MUSCULOSKELETAL:  Normal muscle strength and tone in all four extremities.  No peripheral edema or cyanosis. SKIN: Skin turgor is normal. There are no pathologic skin lesions.  NEUROLOGIC:  Motor and sensation is grossly normal.  Cranial nerves are grossly intact. PSYCH:  Alert and oriented to person, place and time. Affect is normal.  Laboratory Analysis: Results for orders placed or performed during the hospital encounter of 08/31/19 (from the past 24 hour(s))  Lipase, blood     Status: None   Collection Time: 08/31/19 11:03 AM  Result Value Ref Range   Lipase 28 11 - 51 U/L  Comprehensive metabolic panel     Status: Abnormal   Collection Time: 08/31/19 11:03 AM  Result Value Ref Range   Sodium 142 135 - 145 mmol/L   Potassium 3.6 3.5 - 5.1 mmol/L  Chloride 98 98 - 111 mmol/L   CO2 31 22 - 32 mmol/L   Glucose, Bld 131 (H) 70 - 99 mg/dL   BUN 16 8 - 23 mg/dL   Creatinine, Ser 7.86 0.44 - 1.00 mg/dL   Calcium 9.1 8.9 - 76.7 mg/dL   Total Protein 7.4 6.5 - 8.1 g/dL   Albumin 3.9 3.5 - 5.0 g/dL   AST 22 15 - 41 U/L   ALT 14 0 - 44 U/L   Alkaline Phosphatase 68 38 - 126 U/L   Total Bilirubin 1.0 0.3 - 1.2 mg/dL   GFR calc non Af Amer 50 (L) >60 mL/min   GFR calc Af Amer 58 (L) >60 mL/min   Anion gap 13 5 - 15  CBC     Status: None   Collection Time: 08/31/19 11:03 AM  Result Value Ref Range   WBC 7.4 4.0 - 10.5 K/uL   RBC 4.22 3.87 - 5.11 MIL/uL   Hemoglobin 12.4 12.0 - 15.0 g/dL   HCT 20.9 47.0 - 96.2 %   MCV 93.8 80.0 - 100.0 fL   MCH 29.4 26.0 - 34.0 pg   MCHC 31.3 30.0 - 36.0 g/dL   RDW 83.6 62.9 - 47.6 %   Platelets 206 150 - 400 K/uL   nRBC 0.0 0.0 - 0.2 %    Imaging: CT ABDOMEN PELVIS W CONTRAST  Result Date: 08/31/2019 CLINICAL DATA:  Onset right upper quadrant pain last night. EXAM: CT ABDOMEN AND PELVIS WITH CONTRAST  TECHNIQUE: Multidetector CT imaging of the abdomen and pelvis was performed using the standard protocol following bolus administration of intravenous contrast. CONTRAST:  75 mL OMNIPAQUE IOHEXOL 300 MG/ML  SOLN COMPARISON:  None. FINDINGS: Lower chest: Lung bases clear. There is cardiomegaly. No pleural or pericardial effusion. Hepatobiliary: A 4 cm in diameter lamellated stone is seen in the gallbladder. The gallbladder is dilated and its wall appears mildly thickened. The liver and biliary tree are normal in appearance. Pancreas: Unremarkable. No pancreatic ductal dilatation or surrounding inflammatory changes. Spleen: Normal in size without focal abnormality. Adrenals/Urinary Tract: Adrenal glands are unremarkable. Kidneys are normal, without renal calculi, focal lesion, or hydronephrosis. Bladder is unremarkable. Stomach/Bowel: Stomach is within normal limits. Appendix appears normal. No evidence of bowel wall thickening, distention, or inflammatory changes. Vascular/Lymphatic: Aortic atherosclerosis. No enlarged abdominal or pelvic lymph nodes. Reproductive: Uterus and bilateral adnexa are unremarkable. Other: None. Musculoskeletal: There is severe convex left thoracolumbar scoliosis and multilevel degenerative change. The patient is status post right hip replacement. No acute or focal abnormality. IMPRESSION: 4 cm gallstone with gallbladder wall thickening and dilatation worrisome for acute cholecystitis. Recommend right upper quadrant ultrasound for further evaluation. Atherosclerosis. Scoliosis and multilevel degenerative disease. Electronically Signed   By: Drusilla Kanner M.D.   On: 08/31/2019 12:56   US Abdomen Limited RUQ  Result Date: 08/31/2019 CLINICAL DATA:  Right upper quadrant abdominal pain. EXAM: ULTRASOUND ABDOMEN LIMITED RIGHT UPPER QUADRANT COMPARISON:  None. FINDINGS: Gallbladder: 4 cm gallstone is noted. No significant gallbladder wall thickening or pericholecystic fluid is noted.  Positive sonographic Murphy's sign is noted. Common bile duct: Diameter: 3 mm which is within normal limits. Liver: No focal lesion identified. Within normal limits in parenchymal echogenicity. Portal vein is patent on color Doppler imaging with normal direction of blood flow towards the liver. Other: None. IMPRESSION: Large solitary gallstone is noted. No gallbladder wall thickening or pericholecystic fluid is noted, but positive sonographic Murphy's sign is noted. If there is concern  for cholecystitis, HIDA scan is recommended for further evaluation. Electronically Signed   By: Lupita Raider M.D.   On: 08/31/2019 14:19    Assessment and Plan: This is a 86 y.o. female with cholelithiasis with large 4 cm gallstone and RUQ pain that is persisting.  --Discussed with the patient and her son that my biggest concern is her frailty and her comorbidities and her risk of surgery.  This is unfortunately a difficult situation.  I would not want to cause more harm than benefit in this scenario.  I think it would be more beneficial to admit the patient to the medical team, to have her assessed by both hospitalist and cardiology teams to assess her risk for surgery.  If risk is too high, then we would manage conservatively and if needed with a percutaneous cholecystostomy tube.  The patient and her son are aware that a percutaneous cholecystostomy tube may have to stay in indefinitely if we cannot optimize her medical condition.  If she is deemed appropriate for surgery, we would have to wait until Monday when the Eliquis is off her system prior to doing a cholecystectomy. --In the meantime, would recommend keeping the patient NPO.  Agree with antibiotic management given likely cholecystitis.  Hold Eliquis for the time being.  We will continue to follow with you.  Face-to-face time spent with the patient and care providers was 80 minutes, with more than 50% of the time spent counseling, educating, and coordinating  care of the patient.     Howie Ill, MD [facility] Pg:  [phone]

## 2019-08-31 NOTE — H&P (Signed)
[facility] [place] [facility]   PATIENT NAME: Diana Powell    MR#:  [id number]  DATE OF BIRTH:  Jul 21, 1926  DATE OF ADMISSION:  08/31/2019  PRIMARY CARE PHYSICIAN: Lollie Marrow, MD   REQUESTING/REFERRING PHYSICIAN: Dr Shaune Pollack  CHIEF COMPLAINT:   Chief Complaint  Patient presents with  . Abdominal Pain    HISTORY OF PRESENT ILLNESS:  Diana Powell  is a 86 y.o. female coming in with abdominal pain since yesterday.  Pain was really bad this morning.  Better with pain medications and now down to 5 at a 6 out of 10 intensity.  It is a standing dull pain that varies.  Associated with nausea but no vomiting.  No diarrhea.  No fever.  History obtained from son present at the bedside and she wanted him to translate.  PAST MEDICAL HISTORY:   Past Medical History:  Diagnosis Date  . Atrial fibrillation (HCC)   . Chronic systolic heart failure (HCC)   . COPD (chronic obstructive pulmonary disease) (HCC)   . Osteoarthritis, multiple sites   . Osteoporosis, post-menopausal   . Presence of permanent cardiac pacemaker     PAST SURGICAL HISTORY:   Past Surgical History:  Procedure Laterality Date  . CARDIOVERSION  ~2009  . HIP FRACTURE SURGERY Bilateral [phone]   each hip fractured at separate times  . INSERT / REPLACE / REMOVE PACEMAKER    . TOTAL HIP ARTHROPLASTY Right 04/20/2018   Procedure: TOTAL HIP CONVERSION;  Surgeon: Kennedy Bucker, MD;  Location: [facility] ORS;  Service: Orthopedics;  Laterality: Right;    SOCIAL HISTORY:   Social History   Tobacco Use  . Smoking status: Never Smoker  . Smokeless tobacco: Never Used  Substance Use Topics  . Alcohol use: Not Currently    FAMILY HISTORY:   Family History  Problem Relation Age of Onset  . CAD Mother   . CVA Mother     DRUG ALLERGIES:  No Known Allergies  REVIEW OF SYSTEMS:  CONSTITUTIONAL: No fever, chills or sweats.  Positive for weight gain then some weight loss.   EYES: Has  poor vision EARS, NOSE, AND THROAT: Poor hearing RESPIRATORY: No cough.  Always has shortness of breath shortness of breath, wheezing or hemoptysis.  CARDIOVASCULAR: No chest pain, but does complain of pain around the pacemaker site.  GASTROINTESTINAL: Positive for nausea and abdominal pain.  No vomiting, diarrhea. No blood in bowel movements GENITOURINARY: No dysuria ENDOCRINE: No polyuria HEMATOLOGY: No anemia SKIN: No rash NEUROLOGIC: No weakness.  PSYCHIATRY: No anxiety or depression.   MEDICATIONS AT HOME:   Prior to Admission medications   Medication Sig Start Date End Date Taking? Authorizing Provider  acetaminophen (TYLENOL) 500 MG tablet Take 500 mg by mouth in the morning and at bedtime. Also if needed   Yes [provider]  apixaban (ELIQUIS) 5 MG TABS tablet Take 2 tablets (10 mg total) by mouth 2 (two) times daily for 7 days. Take apixaban 10 mg twice daily for 7 days followed by 5 mg p.o. twice daily after that. Patient taking differently: Take 5 mg by mouth 2 (two) times daily.  05/01/18 08/31/19 Yes Katha Hamming, MD  budesonide (PULMICORT) 0.5 MG/2ML nebulizer solution Take 0.5 mg by nebulization daily. 01/23/18  Yes [provider]  Calcium Carbonate-Vitamin D (CALCIUM-VITAMIN D) 500-200 MG-UNIT per tablet Take 1 tablet by mouth daily.   Yes [provider]  ferrous sulfate 325 (65 FE) MG tablet Take 325 mg  by mouth daily with breakfast.   Yes [provider]  formoterol (PERFOROMIST) 20 MCG/2ML nebulizer solution Inhale 20 mcg into the lungs every 12 (twelve) hours. 11/30/17  Yes [provider]  furosemide (LASIX) 40 MG tablet Take 40 mg by mouth See admin instructions. Take 80 mg in the morning and 40 mg in the evening   Yes [provider]  ipratropium-albuterol (DUONEB) 0.5-2.5 (3) MG/3ML SOLN Inhale 3 mLs into the lungs every 8 (eight) hours as needed. 08/24/19  Yes [provider]  potassium chloride  (K-DUR) 10 MEQ tablet Take 10 mEq by mouth daily. 02/18/18  Yes [provider]  vitamin B-12 (CYANOCOBALAMIN) 1000 MCG tablet Take 1,000 mcg by mouth daily.   Yes [provider]      VITAL SIGNS:  Blood pressure (!) 155/70, pulse 70, temperature (!) 97.5 F (36.4 C), temperature source Oral, resp. rate 18, height 5\' 3"  (1.6 m), weight 81.6 kg, SpO2 96 %.  PHYSICAL EXAMINATION:  GENERAL:  86 y.o.-year-old patient lying in the bed with no acute distress.  EYES: Pupils equal, round, reactive to light and accommodation. No scleral icterus. Extraocular muscles intact.  HEENT: Head atraumatic, normocephalic. Oropharynx and nasopharynx clear.  NECK:  Supple, no jugular venous distention. No thyroid enlargement, no tenderness.  LUNGS: Decreased breath sounds bilaterally, no wheezing, rales,rhonchi or crepitation. No use of accessory muscles of respiration.  CARDIOVASCULAR: S1, S2 normal. No murmurs, rubs, or gallops.  ABDOMEN: Soft, right upper quadrant abdominal pain, nondistended. Bowel sounds present. No organomegaly or mass.  EXTREMITIES: No pedal edema, cyanosis, or clubbing.  NEUROLOGIC: Cranial nerves II through XII are intact. Muscle strength 5/5 in all extremities. Sensation intact. Gait not checked.  PSYCHIATRIC: The patient is alert and oriented x 3.  SKIN: No rash, lesion, or ulcer.   LABORATORY PANEL:   CBC Recent Labs  Lab 08/31/19 1103  WBC 7.4  HGB 12.4  HCT 39.6  PLT 206   ------------------------------------------------------------------------------------------------------------------  Chemistries  Recent Labs  Lab 08/31/19 1103  NA 142  K 3.6  CL 98  CO2 31  GLUCOSE 131*  BUN 16  CREATININE 0.99  CALCIUM 9.1  AST 22  ALT 14  ALKPHOS 68  BILITOT 1.0   ------------------------------------------------------------------------------------------------------------------    RADIOLOGY:  CT ABDOMEN PELVIS W CONTRAST  Result Date:  08/31/2019 CLINICAL DATA:  Onset right upper quadrant pain last night. EXAM: CT ABDOMEN AND PELVIS WITH CONTRAST TECHNIQUE: Multidetector CT imaging of the abdomen and pelvis was performed using the standard protocol following bolus administration of intravenous contrast. CONTRAST:  75 mL OMNIPAQUE IOHEXOL 300 MG/ML  SOLN COMPARISON:  None. FINDINGS: Lower chest: Lung bases clear. There is cardiomegaly. No pleural or pericardial effusion. Hepatobiliary: A 4 cm in diameter lamellated stone is seen in the gallbladder. The gallbladder is dilated and its wall appears mildly thickened. The liver and biliary tree are normal in appearance. Pancreas: Unremarkable. No pancreatic ductal dilatation or surrounding inflammatory changes. Spleen: Normal in size without focal abnormality. Adrenals/Urinary Tract: Adrenal glands are unremarkable. Kidneys are normal, without renal calculi, focal lesion, or hydronephrosis. Bladder is unremarkable. Stomach/Bowel: Stomach is within normal limits. Appendix appears normal. No evidence of bowel wall thickening, distention, or inflammatory changes. Vascular/Lymphatic: Aortic atherosclerosis. No enlarged abdominal or pelvic lymph nodes. Reproductive: Uterus and bilateral adnexa are unremarkable. Other: None. Musculoskeletal: There is severe convex left thoracolumbar scoliosis and multilevel degenerative change. The patient is status post right hip replacement. No acute or focal abnormality. IMPRESSION: 4  cm gallstone with gallbladder wall thickening and dilatation worrisome for acute cholecystitis. Recommend right upper quadrant ultrasound for further evaluation. Atherosclerosis. Scoliosis and multilevel degenerative disease. Electronically Signed   By: Inge Rise M.D.   On: 08/31/2019 12:56   US Abdomen Limited RUQ  Result Date: 08/31/2019 CLINICAL DATA:  Right upper quadrant abdominal pain. EXAM: ULTRASOUND ABDOMEN LIMITED RIGHT UPPER QUADRANT COMPARISON:  None. FINDINGS:  Gallbladder: 4 cm gallstone is noted. No significant gallbladder wall thickening or pericholecystic fluid is noted. Positive sonographic Murphy's sign is noted. Common bile duct: Diameter: 3 mm which is within normal limits. Liver: No focal lesion identified. Within normal limits in parenchymal echogenicity. Portal vein is patent on color Doppler imaging with normal direction of blood flow towards the liver. Other: None. IMPRESSION: Large solitary gallstone is noted. No gallbladder wall thickening or pericholecystic fluid is noted, but positive sonographic Murphy's sign is noted. If there is concern for cholecystitis, HIDA scan is recommended for further evaluation. Electronically Signed   By: Marijo Conception M.D.   On: 08/31/2019 14:19    EKG:   EKG ordered by me  IMPRESSION AND PLAN:   1.  Acute cholecystitis.  Empiric Zosyn.  Surgeon wants to see how antibiotics do before going to the operating room.  We will hold Eliquis and start heparin drip this evening. 2.  Paroxysmal atrial fibrillation history of an ablation in the past.  Get an EKG and monitor on telemetry.  Holding Eliquis.  Heparin drip to be started tonight 3.  Chronic systolic congestive heart failure.  We will get an echocardiogram.  Careful with fluids.  On Lasix. 4.  COPD continue nebulizer treatments.  Respiratory status stable.  All the records are reviewed and case discussed with ED provider. Management plans discussed with the patient, family and they are in agreement.  CODE STATUS: Full code  TOTAL TIME TAKING CARE OF THIS PATIENT: 50 minutes.    Loletha Grayer M.D on 08/31/2019 at 4:09 PM  Between 7am to 6pm - Pager - [phone]  After 6pm call admission pager [phone]  [facility]  CC: Primary care physician; Layne Benton, MD

## 2019-09-01 ENCOUNTER — Inpatient Hospital Stay: Admit: 2019-09-01 | Payer: Medicare Other

## 2019-09-01 ENCOUNTER — Inpatient Hospital Stay: Payer: Medicare Other

## 2019-09-01 DIAGNOSIS — I480 Paroxysmal atrial fibrillation: Secondary | ICD-10-CM | POA: Diagnosis not present

## 2019-09-01 DIAGNOSIS — K810 Acute cholecystitis: Secondary | ICD-10-CM | POA: Diagnosis not present

## 2019-09-01 DIAGNOSIS — I5022 Chronic systolic (congestive) heart failure: Secondary | ICD-10-CM | POA: Diagnosis not present

## 2019-09-01 DIAGNOSIS — J9601 Acute respiratory failure with hypoxia: Secondary | ICD-10-CM

## 2019-09-01 DIAGNOSIS — N179 Acute kidney failure, unspecified: Secondary | ICD-10-CM

## 2019-09-01 DIAGNOSIS — J449 Chronic obstructive pulmonary disease, unspecified: Secondary | ICD-10-CM | POA: Diagnosis not present

## 2019-09-01 DIAGNOSIS — N189 Chronic kidney disease, unspecified: Secondary | ICD-10-CM

## 2019-09-01 DIAGNOSIS — K8000 Calculus of gallbladder with acute cholecystitis without obstruction: Secondary | ICD-10-CM | POA: Diagnosis not present

## 2019-09-01 LAB — COMPREHENSIVE METABOLIC PANEL
ALT: 150 U/L — ABNORMAL HIGH (ref 0–44)
AST: 171 U/L — ABNORMAL HIGH (ref 15–41)
Albumin: 3.5 g/dL (ref 3.5–5.0)
Alkaline Phosphatase: 154 U/L — ABNORMAL HIGH (ref 38–126)
Anion gap: 14 (ref 5–15)
BUN: 19 mg/dL (ref 8–23)
CO2: 29 mmol/L (ref 22–32)
Calcium: 8.4 mg/dL — ABNORMAL LOW (ref 8.9–10.3)
Chloride: 97 mmol/L — ABNORMAL LOW (ref 98–111)
Creatinine, Ser: 1.13 mg/dL — ABNORMAL HIGH (ref 0.44–1.00)
GFR calc Af Amer: 50 mL/min — ABNORMAL LOW (ref >60)
GFR calc non Af Amer: 43 mL/min — ABNORMAL LOW (ref >60)
Glucose, Bld: 167 mg/dL — ABNORMAL HIGH (ref 70–99)
Potassium: 3.5 mmol/L (ref 3.5–5.1)
Sodium: 140 mmol/L (ref 135–145)
Total Bilirubin: 1.8 mg/dL — ABNORMAL HIGH (ref 0.3–1.2)
Total Protein: 6.9 g/dL (ref 6.5–8.1)

## 2019-09-01 LAB — BASIC METABOLIC PANEL
Anion gap: 13 (ref 5–15)
BUN: 17 mg/dL (ref 8–23)
CO2: 29 mmol/L (ref 22–32)
Calcium: 8.5 mg/dL — ABNORMAL LOW (ref 8.9–10.3)
Chloride: 100 mmol/L (ref 98–111)
Creatinine, Ser: 1.24 mg/dL — ABNORMAL HIGH (ref 0.44–1.00)
GFR calc Af Amer: 44 mL/min — ABNORMAL LOW (ref >60)
GFR calc non Af Amer: 38 mL/min — ABNORMAL LOW (ref >60)
Glucose, Bld: 143 mg/dL — ABNORMAL HIGH (ref 70–99)
Potassium: 3.6 mmol/L (ref 3.5–5.1)
Sodium: 142 mmol/L (ref 135–145)

## 2019-09-01 LAB — LIPASE, BLOOD: Lipase: 23 U/L (ref 11–51)

## 2019-09-01 LAB — CBC
HCT: 37.6 % (ref 36.0–46.0)
HCT: 35.1 % — ABNORMAL LOW (ref 36.0–46.0)
Hemoglobin: 11.6 g/dL — ABNORMAL LOW (ref 12.0–15.0)
Hemoglobin: 10.9 g/dL — ABNORMAL LOW (ref 12.0–15.0)
MCH: 29.7 pg (ref 26.0–34.0)
MCH: 29.4 pg (ref 26.0–34.0)
MCHC: 30.9 g/dL (ref 30.0–36.0)
MCHC: 31.1 g/dL (ref 30.0–36.0)
MCV: 96.2 fL (ref 80.0–100.0)
MCV: 94.6 fL (ref 80.0–100.0)
Platelets: 181 10*3/uL (ref 150–400)
Platelets: 167 10*3/uL (ref 150–400)
RBC: 3.91 MIL/uL (ref 3.87–5.11)
RBC: 3.71 MIL/uL — ABNORMAL LOW (ref 3.87–5.11)
RDW: 12.8 % (ref 11.5–15.5)
RDW: 13 % (ref 11.5–15.5)
WBC: 9.9 10*3/uL (ref 4.0–10.5)
WBC: 13.5 10*3/uL — ABNORMAL HIGH (ref 4.0–10.5)
nRBC: 0 % (ref 0.0–0.2)
nRBC: 0 % (ref 0.0–0.2)

## 2019-09-01 LAB — APTT
aPTT: 97 seconds — ABNORMAL HIGH (ref 24–36)
aPTT: 100 seconds — ABNORMAL HIGH (ref 24–36)

## 2019-09-01 LAB — HEPARIN LEVEL (UNFRACTIONATED): Heparin Unfractionated: >3.6 IU/mL — ABNORMAL HIGH (ref 0.30–0.70)

## 2019-09-01 LAB — LACTIC ACID, PLASMA: Lactic Acid, Venous: 2 mmol/L (ref 0.5–1.9)

## 2019-09-01 MED ORDER — BUDESONIDE 0.5 MG/2ML IN SUSP
0.5000 mg | Freq: Two times a day (BID) | RESPIRATORY_TRACT | Status: DC
  Administered 2019-09-01 – 2019-09-05 (×8): 0.5 mg via RESPIRATORY_TRACT
  Filled 2019-09-01 (×8): qty 2

## 2019-09-01 MED ORDER — HYDROMORPHONE HCL 1 MG/ML IJ SOLN
0.5000 mg | INTRAMUSCULAR | Status: DC | PRN
  Administered 2019-09-01 – 2019-09-04 (×7): 0.5 mg via INTRAVENOUS
  Filled 2019-09-01 (×6): qty 0.5

## 2019-09-01 MED ORDER — OXYCODONE HCL 5 MG PO TABS
5.0000 mg | ORAL_TABLET | ORAL | Status: DC | PRN
  Administered 2019-09-01 – 2019-09-04 (×7): 5 mg via ORAL
  Filled 2019-09-01 (×7): qty 1

## 2019-09-01 MED ORDER — IPRATROPIUM-ALBUTEROL 0.5-2.5 (3) MG/3ML IN SOLN
3.0000 mL | RESPIRATORY_TRACT | Status: DC
  Administered 2019-09-01 – 2019-09-03 (×7): 3 mL via RESPIRATORY_TRACT
  Filled 2019-09-01 (×7): qty 3

## 2019-09-01 MED ORDER — HYDROMORPHONE HCL 1 MG/ML IJ SOLN
1.0000 mg | INTRAMUSCULAR | Status: DC | PRN
  Filled 2019-09-01: qty 1

## 2019-09-01 MED ORDER — POLYETHYLENE GLYCOL 3350 17 G PO PACK
17.0000 g | PACK | Freq: Every day | ORAL | Status: DC
  Administered 2019-09-01 – 2019-09-02 (×2): 17 g via ORAL
  Filled 2019-09-01 (×3): qty 1

## 2019-09-01 MED ORDER — MORPHINE SULFATE (PF) 2 MG/ML IV SOLN
2.0000 mg | INTRAVENOUS | Status: DC | PRN
  Administered 2019-09-01: 2 mg via INTRAVENOUS
  Filled 2019-09-01: qty 1

## 2019-09-01 MED ORDER — FUROSEMIDE 40 MG PO TABS: 40.0000 mg | ORAL_TABLET | Freq: Every day | ORAL | Status: DC

## 2019-09-01 NOTE — Progress Notes (Signed)
Patient ID: Diana Powell, female   DOB: May 01, 1927, 86 y.o.   MRN: [id number] [facility] PROGRESS NOTE  Jennetta Flood YQM:[id number] DOB: [birth year] DOA: 08/31/2019 PCP: Lollie Marrow, MD  HPI/Subjective: Patient still having some abdominal pain located in the right upper quadrant.  Some nausea but no vomiting.  No diarrhea.  No chest pain.  Always have some shortness of breath.  Son at the bedside and she wants to use him only as the Nurse, learning disability.  Objective: Vitals:   09/01/19 1315 09/01/19 1318  BP:  (!) 132/57  Pulse:  70  Resp:  18  Temp:  98.4 F (36.9 C)  SpO2: (!) 82% 91%    Intake/Output Summary (Last 24 hours) at 09/01/2019 1333 Last data filed at 09/01/2019 0930 Gross per 24 hour  Intake 782.7 ml  Output 200 ml  Net 582.7 ml   Filed Weights   08/31/19 1100  Weight: 81.6 kg    ROS: Review of Systems  Constitutional: Negative for fever.  HENT: Negative for sore throat.   Respiratory: Positive for shortness of breath.   Cardiovascular: Negative for chest pain.  Gastrointestinal: Positive for abdominal pain and nausea. Negative for vomiting.  Genitourinary: Negative for dysuria.  Musculoskeletal: Negative for joint pain.   Exam: Physical Exam  HENT:  Nose: No mucosal edema.  Mouth/Throat: No oropharyngeal exudate or posterior oropharyngeal edema.  Eyes: Conjunctivae and lids are normal.  Neck: Carotid bruit is not present.  Cardiovascular: Regular rhythm, S1 normal and S2 normal. Exam reveals no gallop.  Murmur heard.  Systolic murmur is present with a grade of 2/6. Pulses:      Dorsalis pedis pulses are 2+ on the right side and 2+ on the left side.  Respiratory: No respiratory distress. She has no wheezes. She has no rhonchi. She has no rales.  GI: Soft. Bowel sounds are normal. There is abdominal tenderness in the right upper quadrant.  Musculoskeletal:     Right ankle: No swelling.     Left ankle: No swelling.  Lymphadenopathy:    She has no  cervical adenopathy.  Neurological: She is alert. No cranial nerve deficit.  Skin: Skin is warm. No rash noted. Nails show no clubbing.  Psychiatric: She has a normal mood and affect.      Data Reviewed: Basic Metabolic Panel: Recent Labs  Lab 08/31/19 1103 09/01/19 0538  NA 142 142  K 3.6 3.6  CL 98 100  CO2 31 29  GLUCOSE 131* 143*  BUN 16 17  CREATININE 0.99 1.24*  CALCIUM 9.1 8.5*   Liver Function Tests: Recent Labs  Lab 08/31/19 1103  AST 22  ALT 14  ALKPHOS 68  BILITOT 1.0  PROT 7.4  ALBUMIN 3.9   Recent Labs  Lab 08/31/19 1103  LIPASE 28   CBC: Recent Labs  Lab 08/31/19 1103 09/01/19 0538  WBC 7.4 9.9  HGB 12.4 11.6*  HCT 39.6 37.6  MCV 93.8 96.2  PLT 206 181     Recent Results (from the past 240 hour(s))  Respiratory Panel by RT PCR (Flu A&B, Covid) - Nasopharyngeal Swab     Status: None   Collection Time: 08/31/19  4:36 PM   Specimen: Nasopharyngeal Swab  Result Value Ref Range Status   SARS Coronavirus 2 by RT PCR NEGATIVE NEGATIVE Final    Comment: (NOTE) SARS-CoV-2 target nucleic acids are NOT DETECTED. The SARS-CoV-2 RNA is generally detectable in upper respiratoy specimens during the acute phase of infection. The lowest concentration  of SARS-CoV-2 viral copies this assay can detect is 131 copies/mL. A negative result does not preclude SARS-Cov-2 infection and should not be used as the sole basis for treatment or other patient management decisions. A negative result may occur with  improper specimen collection/handling, submission of specimen other than nasopharyngeal swab, presence of viral mutation(s) within the areas targeted by this assay, and inadequate number of viral copies (<131 copies/mL). A negative result must be combined with clinical observations, patient history, and epidemiological information. The expected result is Negative. Fact Sheet for Patients:  https://www.moore.com/ Fact Sheet for  Healthcare Providers:  https://www.young.biz/ This test is not yet ap proved or cleared by the [place] FDA and  has been authorized for detection and/or diagnosis of SARS-CoV-2 by FDA under an Emergency Use Authorization (EUA). This EUA will remain  in effect (meaning this test can be used) for the duration of the COVID-19 declaration under Section 564(b)(1) of the Act, 21 U.S.C. section 360bbb-3(b)(1), unless the authorization is terminated or revoked sooner.    Influenza A by PCR NEGATIVE NEGATIVE Final   Influenza B by PCR NEGATIVE NEGATIVE Final    Comment: (NOTE) The Xpert Xpress SARS-CoV-2/FLU/RSV assay is intended as an aid in  the diagnosis of influenza from Nasopharyngeal swab specimens and  should not be used as a sole basis for treatment. Nasal washings and  aspirates are unacceptable for Xpert Xpress SARS-CoV-2/FLU/RSV  testing. Fact Sheet for Patients: https://www.moore.com/ Fact Sheet for Healthcare Providers: https://www.young.biz/ This test is not yet approved or cleared by the [place] FDA and  has been authorized for detection and/or diagnosis of SARS-CoV-2 by  FDA under an Emergency Use Authorization (EUA). This EUA will remain  in effect (meaning this test can be used) for the duration of the  Covid-19 declaration under Section 564(b)(1) of the Act, 21  U.S.C. section 360bbb-3(b)(1), unless the authorization is  terminated or revoked. Performed at [facility], [address] [place], [place] [address]      Studies: CT ABDOMEN PELVIS W CONTRAST  Result Date: 08/31/2019 CLINICAL DATA:  Onset right upper quadrant pain last night. EXAM: CT ABDOMEN AND PELVIS WITH CONTRAST TECHNIQUE: Multidetector CT imaging of the abdomen and pelvis was performed using the standard protocol following bolus administration of intravenous contrast. CONTRAST:  75 mL OMNIPAQUE IOHEXOL 300 MG/ML  SOLN  COMPARISON:  None. FINDINGS: Lower chest: Lung bases clear. There is cardiomegaly. No pleural or pericardial effusion. Hepatobiliary: A 4 cm in diameter lamellated stone is seen in the gallbladder. The gallbladder is dilated and its wall appears mildly thickened. The liver and biliary tree are normal in appearance. Pancreas: Unremarkable. No pancreatic ductal dilatation or surrounding inflammatory changes. Spleen: Normal in size without focal abnormality. Adrenals/Urinary Tract: Adrenal glands are unremarkable. Kidneys are normal, without renal calculi, focal lesion, or hydronephrosis. Bladder is unremarkable. Stomach/Bowel: Stomach is within normal limits. Appendix appears normal. No evidence of bowel wall thickening, distention, or inflammatory changes. Vascular/Lymphatic: Aortic atherosclerosis. No enlarged abdominal or pelvic lymph nodes. Reproductive: Uterus and bilateral adnexa are unremarkable. Other: None. Musculoskeletal: There is severe convex left thoracolumbar scoliosis and multilevel degenerative change. The patient is status post right hip replacement. No acute or focal abnormality. IMPRESSION: 4 cm gallstone with gallbladder wall thickening and dilatation worrisome for acute cholecystitis. Recommend right upper quadrant ultrasound for further evaluation. Atherosclerosis. Scoliosis and multilevel degenerative disease. Electronically Signed   By: Drusilla Kanner M.D.   On: 08/31/2019 12:56   US Abdomen Limited RUQ  Result Date: 08/31/2019 CLINICAL DATA:  Right upper quadrant abdominal pain. EXAM: ULTRASOUND ABDOMEN LIMITED RIGHT UPPER QUADRANT COMPARISON:  None. FINDINGS: Gallbladder: 4 cm gallstone is noted. No significant gallbladder wall thickening or pericholecystic fluid is noted. Positive sonographic Murphy's sign is noted. Common bile duct: Diameter: 3 mm which is within normal limits. Liver: No focal lesion identified. Within normal limits in parenchymal echogenicity. Portal vein is patent  on color Doppler imaging with normal direction of blood flow towards the liver. Other: None. IMPRESSION: Large solitary gallstone is noted. No gallbladder wall thickening or pericholecystic fluid is noted, but positive sonographic Murphy's sign is noted. If there is concern for cholecystitis, HIDA scan is recommended for further evaluation. Electronically Signed   By: Marijo Conception M.D.   On: 08/31/2019 14:19    Scheduled Meds: . acetaminophen  500 mg Oral BID  . arformoterol  15 mcg Nebulization Q12H  . budesonide  0.5 mg Nebulization BID  . [START ON 09/02/2019] furosemide  40 mg Oral Daily  . ipratropium-albuterol  3 mL Inhalation [id number]  . potassium chloride  10 mEq Oral Daily  . vitamin B-12  1,000 mcg Oral Daily   Continuous Infusions: . heparin 950 Units/hr (09/01/19 0700)  . piperacillin-tazobactam (ZOSYN)  IV 12.5 mL/hr at 09/01/19 0700    Assessment/Plan:  1. Acute cholecystitis.  Patient on empiric Zosyn.  Patient still having abdominal pain.  As needed oral and IV pain medications ordered.  General surgery wants to wait till Monday for laparoscopic cholecystectomy.  Eliquis on hold since yesterday. 2. Paroxysmal atrial fibrillation.  Patient is on heparin drip currently to prevent stroke while holding Eliquis.  Patient not on any rate controlling medications. 3. Chronic systolic congestive heart failure.  Echocardiogram ordered.  Holding fluids.  Decrease Lasix dose down to daily dosing since awaiting home liquid diet at this point. 4. Acute hypoxic respiratory failure with COPD.  Continue nebulizer treatments.  Currently on oxygen.  Continue to monitor respiratory status. 5. Acute kidney injury on chronic kidney disease stage III.  Cut back to Lasix once a day since creatinine went up to 1.24.  Family hesitant about fluids with her history of CHF. 6. We will get physical therapy evaluation  Code Status:     Code Status Orders  (From admission, onward)         Start      Ordered   08/31/19 1609  Full code  Continuous     08/31/19 1609        Code Status History    Date Active Date Inactive Code Status Order ID Comments User Context   04/29/2018 0052 05/01/2018 1459 Full Code [id number]  Amelia Jo, MD ED   04/20/2018 1512 04/26/2018 1539 Full Code [id number]  Hessie Knows, MD Inpatient   [facility] Care Planning Activity     Family Communication: Son at the bedside Disposition Plan: Patient to have laparoscopic cholecystectomy on Monday 07/02/2020.  Patient will have to be reevaluated on Tuesday the 16th for potential discharge.  Consultants:  General surgery  cardiology  Time spent: 27 minutes  [place]

## 2019-09-01 NOTE — Progress Notes (Signed)
09/01/2019  Subjective: No acute events overnight.  Patient required some pain medication overnight.  Today her pain is controlled.  Denies nausea at this point.  Has been able to drink some small amount of liquids but mostly water.  Has been intermittently on nasal cannula but currently is off oxygen  Vital signs: Temp:  [98.1 F (36.7 C)-98.5 F (36.9 C)] 98.2 F (36.8 C) (02/13 0600) Pulse Rate:  [69-71] 69 (02/13 0900) Resp:  [16-20] 20 (02/13 0600) BP: (120-155)/(59-64) 129/63 (02/13 0900) SpO2:  [92 %-96 %] 94 % (02/13 0601)   Intake/Output: 02/12 0701 - 02/13 0700 In: 182.7 [I.V.:121.9; IV Piggyback:60.8] Out: 100 [Urine:100]    Physical Exam: Constitutional: No acute distress Abdomen: Soft, nondistended, with mild tenderness to palpation in the epigastric area and right upper quadrant.  No peritonitis and negative Murphy sign  Labs:  Recent Labs    08/31/19 1103 09/01/19 0538  WBC 7.4 9.9  HGB 12.4 11.6*  HCT 39.6 37.6  PLT 206 181   Recent Labs    08/31/19 1103 09/01/19 0538  NA 142 142  K 3.6 3.6  CL 98 100  CO2 31 29  GLUCOSE 131* 143*  BUN 16 17  CREATININE 0.99 1.24*  CALCIUM 9.1 8.5*   Recent Labs    08/31/19 1758  LABPROT 16.5*  INR 1.3*    Imaging: CT ABDOMEN PELVIS W CONTRAST  Result Date: 08/31/2019 CLINICAL DATA:  Onset right upper quadrant pain last night. EXAM: CT ABDOMEN AND PELVIS WITH CONTRAST TECHNIQUE: Multidetector CT imaging of the abdomen and pelvis was performed using the standard protocol following bolus administration of intravenous contrast. CONTRAST:  75 mL OMNIPAQUE IOHEXOL 300 MG/ML  SOLN COMPARISON:  None. FINDINGS: Lower chest: Lung bases clear. There is cardiomegaly. No pleural or pericardial effusion. Hepatobiliary: A 4 cm in diameter lamellated stone is seen in the gallbladder. The gallbladder is dilated and its wall appears mildly thickened. The liver and biliary tree are normal in appearance. Pancreas: Unremarkable.  No pancreatic ductal dilatation or surrounding inflammatory changes. Spleen: Normal in size without focal abnormality. Adrenals/Urinary Tract: Adrenal glands are unremarkable. Kidneys are normal, without renal calculi, focal lesion, or hydronephrosis. Bladder is unremarkable. Stomach/Bowel: Stomach is within normal limits. Appendix appears normal. No evidence of bowel wall thickening, distention, or inflammatory changes. Vascular/Lymphatic: Aortic atherosclerosis. No enlarged abdominal or pelvic lymph nodes. Reproductive: Uterus and bilateral adnexa are unremarkable. Other: None. Musculoskeletal: There is severe convex left thoracolumbar scoliosis and multilevel degenerative change. The patient is status post right hip replacement. No acute or focal abnormality. IMPRESSION: 4 cm gallstone with gallbladder wall thickening and dilatation worrisome for acute cholecystitis. Recommend right upper quadrant ultrasound for further evaluation. Atherosclerosis. Scoliosis and multilevel degenerative disease. Electronically Signed   By: Drusilla Kanner M.D.   On: 08/31/2019 12:56   US Abdomen Limited RUQ  Result Date: 08/31/2019 CLINICAL DATA:  Right upper quadrant abdominal pain. EXAM: ULTRASOUND ABDOMEN LIMITED RIGHT UPPER QUADRANT COMPARISON:  None. FINDINGS: Gallbladder: 4 cm gallstone is noted. No significant gallbladder wall thickening or pericholecystic fluid is noted. Positive sonographic Murphy's sign is noted. Common bile duct: Diameter: 3 mm which is within normal limits. Liver: No focal lesion identified. Within normal limits in parenchymal echogenicity. Portal vein is patent on color Doppler imaging with normal direction of blood flow towards the liver. Other: None. IMPRESSION: Large solitary gallstone is noted. No gallbladder wall thickening or pericholecystic fluid is noted, but positive sonographic Murphy's sign is noted. If there is  concern for cholecystitis, HIDA scan is recommended for further  evaluation. Electronically Signed   By: Marijo Conception M.D.   On: 08/31/2019 14:19    Assessment/Plan: This is a 86 y.o. female with likely acute cholecystitis.  -Appreciate cardiology's input care of this patient.  At this point cardiologist does not think any further testing is needed and she is stable from the cardiac standpoint and is at lowest risk possible for cholecystectomy. -Continue holding Eliquis.  We would wait until Monday, 2/15, so the effects of Eliquis are fully off before taking her to surgery.  In the meantime continue clear liquid diet, anticoagulation with heparin drip per cardiology, and IV antibiotics.   Melvyn Neth, [facility]

## 2019-09-01 NOTE — Consult Note (Signed)
ANTICOAGULATION CONSULT NOTE  Pharmacy Consult for Heparin Indication: atrial fibrillation  No Known Allergies  Patient Measurements: Height: 5\' 3"  (160 cm) Weight: 180 lb (81.6 kg) IBW/kg (Calculated) : 52.4 Heparin Dosing Weight: 70.3 kg  Vital Signs: Temp: 98.2 F (36.8 C) (02/13 0600) Temp Source: Oral (02/13 0600) BP: 120/59 (02/13 0600) Pulse Rate: 70 (02/13 0601)  Labs: Recent Labs    08/31/19 1103 08/31/19 1758 09/01/19 0538  HGB 12.4  --  11.6*  HCT 39.6  --  37.6  PLT 206  --  181  APTT  --  33 97*  LABPROT  --  16.5*  --   INR  --  1.3*  --   HEPARINUNFRC  --  >3.60* >3.60*  CREATININE 0.99  --  1.24*    Estimated Creatinine Clearance: 30.5 mL/min (A) (by C-G formula based on SCr of 1.24 mg/dL (H)).   Medical History: Past Medical History:  Diagnosis Date  . Atrial fibrillation ([place])   . Chronic systolic heart failure ([place])   . COPD (chronic obstructive pulmonary disease) ([place])   . Osteoarthritis, multiple sites   . Osteoporosis, post-menopausal   . Presence of permanent cardiac pacemaker     Medications:  Medications Prior to Admission  Medication Sig Dispense Refill Last Dose  . acetaminophen (TYLENOL) 500 MG tablet Take 500 mg by mouth in the morning and at bedtime. Also if needed   08/30/2019 at unknown  . apixaban (ELIQUIS) 5 MG TABS tablet Take 2 tablets (10 mg total) by mouth 2 (two) times daily for 7 days. Take apixaban 10 mg twice daily for 7 days followed by 5 mg p.o. twice daily after that. (Patient taking differently: Take 5 mg by mouth 2 (two) times daily. ) 60 tablet 1 08/31/2019 at 0830  . budesonide (PULMICORT) 0.5 MG/2ML nebulizer solution Take 0.5 mg by nebulization daily.  3 08/31/2019 at 0900  . Calcium Carbonate-Vitamin D (CALCIUM-VITAMIN D) 500-200 MG-UNIT per tablet Take 1 tablet by mouth daily.   08/30/2019 at Unknown time  . ferrous sulfate 325 (65 FE) MG tablet Take 325 mg by mouth daily with breakfast.   08/30/2019 at Unknown  time  . formoterol (PERFOROMIST) 20 MCG/2ML nebulizer solution Inhale 20 mcg into the lungs every 12 (twelve) hours.   08/31/2019 at 0900  . furosemide (LASIX) 40 MG tablet Take 40 mg by mouth See admin instructions. Take 80 mg in the morning and 40 mg in the evening   08/31/2019 at 0830  . ipratropium-albuterol (DUONEB) 0.5-2.5 (3) MG/3ML SOLN Inhale 3 mLs into the lungs every 8 (eight) hours as needed.   prn at prn  . potassium chloride (K-DUR) 10 MEQ tablet Take 10 mEq by mouth daily.  3 08/30/2019 at unknown  . vitamin B-12 (CYANOCOBALAMIN) 1000 MCG tablet Take 1,000 mcg by mouth daily.   08/30/2019 at Unknown time   Scheduled:  . acetaminophen  500 mg Oral BID  . arformoterol  15 mcg Nebulization Q12H  . budesonide  0.5 mg Nebulization Daily  . furosemide  40 mg Oral BID  . potassium chloride  10 mEq Oral Daily  . vitamin B-12  1,000 mcg Oral Daily   Infusions:  . sodium chloride Stopped (08/31/19 2102)  . heparin 950 Units/hr (09/01/19 0700)  . piperacillin-tazobactam (ZOSYN)  IV 12.5 mL/hr at 09/01/19 0700   PRN: ipratropium-albuterol, ondansetron **OR** ondansetron (ZOFRAN) IV, oxyCODONE Anti-infectives (From admission, onward)   Start     Dose/Rate Route Frequency Ordered Stop  08/31/19 2200  piperacillin-tazobactam (ZOSYN) IVPB 3.375 g     3.375 g 12.5 mL/hr over 240 Minutes Intravenous Every 8 hours 08/31/19 1605     08/31/19 1330  piperacillin-tazobactam (ZOSYN) IVPB 3.375 g     3.375 g 100 mL/hr over 30 Minutes Intravenous  Once 08/31/19 1315 08/31/19 1503      Assessment: Pharmacy consulted to start heparin for afib. Pt was previously on apixaban. Will order baseline APTT, INR, and heparin level. Most likely use APTT for monitor if heparin level is falsely elevated.   Goal of Therapy:  Heparin level 0.3-0.7 units/ml Monitor platelets by anticoagulation protocol: Yes   Plan:  02/13 @ 0530 aPTT 97 seconds therapeutic. Will continue current rate and will recheck aPTT  at 1300, will continue dosing off aPTT since HL elevated, and dose off aPTT until HL correlates, CBC trending down, likely dilutional will continue to monitor.  Thomasene Ripple, PharmD, BCPS 09/01/2019,7:23 AM

## 2019-09-01 NOTE — Progress Notes (Addendum)
Nurse called reporting patient with significant change in abdominal pain and abdomen distension.  Bedside patient with severe pain. Son at bedside and best alternative to assist with interpretation of pain and symptoms given the significant , concerning change.  Significant pressure now being reported so severe causing chest pressure and back pain.  Pain is mis abdomen, cramping and severe. Abdomin is firm, distended. + bowel sounds, slightly dull with percussion.  Patient reports did pass gas one time about 5 minutes ago.  Son reports constipation.   Vital signs stable, 98.3- 163/77, 70, 20. Pain 10/10 Discussed findings with surgeon, Dr. Aleen Campi. KUB, CMB, CBC, lactic and lipase pending.   Due to severe pain without relief from morphine, pain med changed to hydromorphone. Son at bedside the entire time with patient for exam and plan. Questions answered

## 2019-09-01 NOTE — Consult Note (Addendum)
ANTICOAGULATION CONSULT NOTE  Pharmacy Consult for Heparin Indication: atrial fibrillation  No Known Allergies  Patient Measurements: Height: 5\' 3"  (160 cm) Weight: 180 lb (81.6 kg) IBW/kg (Calculated) : 52.4 Heparin Dosing Weight: 70.3 kg  Vital Signs: Temp: 98.4 F (36.9 C) (02/13 1318) Temp Source: Oral (02/13 1318) BP: 132/57 (02/13 1318) Pulse Rate: 70 (02/13 1318)  Labs: Recent Labs    08/31/19 1103 08/31/19 1758 09/01/19 0538 09/01/19 1320  HGB 12.4  --  11.6*  --   HCT 39.6  --  37.6  --   PLT 206  --  181  --   APTT  --  33 97* 100*  LABPROT  --  16.5*  --   --   INR  --  1.3*  --   --   HEPARINUNFRC  --  >3.60* >3.60*  --   CREATININE 0.99  --  1.24*  --     Estimated Creatinine Clearance: 30.5 mL/min (A) (by C-G formula based on SCr of 1.24 mg/dL (H)).   Medical History: Past Medical History:  Diagnosis Date  . Atrial fibrillation ([place])   . Chronic systolic heart failure ([place])   . COPD (chronic obstructive pulmonary disease) ([place])   . Osteoarthritis, multiple sites   . Osteoporosis, post-menopausal   . Presence of permanent cardiac pacemaker     Medications:  Medications Prior to Admission  Medication Sig Dispense Refill Last Dose  . acetaminophen (TYLENOL) 500 MG tablet Take 500 mg by mouth in the morning and at bedtime. Also if needed   08/30/2019 at unknown  . apixaban (ELIQUIS) 5 MG TABS tablet Take 2 tablets (10 mg total) by mouth 2 (two) times daily for 7 days. Take apixaban 10 mg twice daily for 7 days followed by 5 mg p.o. twice daily after that. (Patient taking differently: Take 5 mg by mouth 2 (two) times daily. ) 60 tablet 1 08/31/2019 at 0830  . budesonide (PULMICORT) 0.5 MG/2ML nebulizer solution Take 0.5 mg by nebulization daily.  3 08/31/2019 at 0900  . Calcium Carbonate-Vitamin D (CALCIUM-VITAMIN D) 500-200 MG-UNIT per tablet Take 1 tablet by mouth daily.   08/30/2019 at Unknown time  . ferrous sulfate 325 (65 FE) MG tablet Take 325 mg  by mouth daily with breakfast.   08/30/2019 at Unknown time  . formoterol (PERFOROMIST) 20 MCG/2ML nebulizer solution Inhale 20 mcg into the lungs every 12 (twelve) hours.   08/31/2019 at 0900  . furosemide (LASIX) 40 MG tablet Take 40 mg by mouth See admin instructions. Take 80 mg in the morning and 40 mg in the evening   08/31/2019 at 0830  . ipratropium-albuterol (DUONEB) 0.5-2.5 (3) MG/3ML SOLN Inhale 3 mLs into the lungs every 8 (eight) hours as needed.   prn at prn  . potassium chloride (K-DUR) 10 MEQ tablet Take 10 mEq by mouth daily.  3 08/30/2019 at unknown  . vitamin B-12 (CYANOCOBALAMIN) 1000 MCG tablet Take 1,000 mcg by mouth daily.   08/30/2019 at Unknown time   Scheduled:  . acetaminophen  500 mg Oral BID  . arformoterol  15 mcg Nebulization Q12H  . budesonide  0.5 mg Nebulization BID  . [START ON 09/02/2019] furosemide  40 mg Oral Daily  . ipratropium-albuterol  3 mL Inhalation [id number]  . potassium chloride  10 mEq Oral Daily  . vitamin B-12  1,000 mcg Oral Daily   Infusions:  . heparin 950 Units/hr (09/01/19 0700)  . piperacillin-tazobactam (ZOSYN)  IV 12.5 mL/hr at 09/01/19  0700   PRN: morphine injection, ondansetron **OR** ondansetron (ZOFRAN) IV, oxyCODONE Anti-infectives (From admission, onward)   Start     Dose/Rate Route Frequency Ordered Stop   08/31/19 2200  piperacillin-tazobactam (ZOSYN) IVPB 3.375 g     3.375 g 12.5 mL/hr over 240 Minutes Intravenous Every 8 hours 08/31/19 1605     08/31/19 1330  piperacillin-tazobactam (ZOSYN) IVPB 3.375 g     3.375 g 100 mL/hr over 30 Minutes Intravenous  Once 08/31/19 1315 08/31/19 1503      Assessment: Pharmacy consulted to start heparin for afib. Pt was previously on apixaban. Will order baseline APTT, INR, and heparin level. Most likely use APTT for monitor if heparin level is falsely elevated.   2/13 0538 aPTT 97 HL > 3.60 2/13 1320 aPTT 100  Goal of Therapy:  APTT 66-102 seconds Heparin level 0.3-0.7 units/ml,  once aPTT and Heparin level correlate.  Monitor platelets by anticoagulation protocol: Yes   Plan:  APTT therapeutic. Will continue current rate and will recheck aPTT, heparin level, and CBC with AM labs. Dose off aPTT until HL correlates, CBC trending down, likely dilutional will continue to monitor.  Ronnald Ramp, PharmD, BCPS 09/01/2019,2:26 PM

## 2019-09-01 NOTE — Progress Notes (Signed)
PT Cancellation Note  Patient Details Name: Diana Powell MRN: [id number] DOB: March 29, 1927   Cancelled Treatment:    Reason Eval/Treat Not Completed: Pain limiting ability to participate, Son in room and reports that his mom is not able to participate. She is moaning in bed. PT will check tomorrow and see if patient is able to participate in PT evaluation.    [address], [place], [place] DPT 09/01/2019, 2:57 PM

## 2019-09-01 NOTE — Consult Note (Signed)
[facility] Cardiology Consultation Note  Patient ID: Diana Powell, MRN: [id number], DOB/AGE: 24-Mar-1927 86 y.o. Admit date: 08/31/2019   Date of Consult: 09/01/2019 Primary Physician: Lollie Marrow, MD Primary Cardiologist: [facility]  Chief Complaint:  Chief Complaint  Patient presents with  . Abdominal Pain   Reason for Consult: Cardiovascular disease  HPI: 86 y.o. female with known chronic systolic dysfunction congestive heart failure previously on appropriate medication management without evidence of significant recent exacerbation or anginal symptoms who has had chronic nonvalvular atrial fibrillation status post AV node ablation with pacemaker placement and continued anticoagulation.  The patient has done very well with control of her heart rate and blood pressure and cardiovascular concerns when she has had significant progression gallbladder disease with pain.  Upon admission to the hospital she did not have any anginal symptoms or heart failure and did have a CAT scan showing no evidence of pulmonary edema.  The patient was taken off her anticoagulation currently on heparin and feels well.  The patient therefore is lowest risk possible for treatment of gallbladder disease in a surgical manner  Past Medical History:  Diagnosis Date  . Atrial fibrillation (HCC)   . Chronic systolic heart failure (HCC)   . COPD (chronic obstructive pulmonary disease) (HCC)   . Osteoarthritis, multiple sites   . Osteoporosis, post-menopausal   . Presence of permanent cardiac pacemaker       Surgical History:  Past Surgical History:  Procedure Laterality Date  . CARDIOVERSION  ~2009  . HIP FRACTURE SURGERY Bilateral [phone]   each hip fractured at separate times  . INSERT / REPLACE / REMOVE PACEMAKER    . TOTAL HIP ARTHROPLASTY Right 04/20/2018   Procedure: TOTAL HIP CONVERSION;  Surgeon: Kennedy Bucker, MD;  Location: [facility] ORS;  Service: Orthopedics;  Laterality: Right;     Home Meds: Prior  to Admission medications   Medication Sig Start Date End Date Taking? Authorizing Provider  acetaminophen (TYLENOL) 500 MG tablet Take 500 mg by mouth in the morning and at bedtime. Also if needed   Yes [provider]  apixaban (ELIQUIS) 5 MG TABS tablet Take 2 tablets (10 mg total) by mouth 2 (two) times daily for 7 days. Take apixaban 10 mg twice daily for 7 days followed by 5 mg p.o. twice daily after that. Patient taking differently: Take 5 mg by mouth 2 (two) times daily.  05/01/18 08/31/19 Yes Katha Hamming, MD  budesonide (PULMICORT) 0.5 MG/2ML nebulizer solution Take 0.5 mg by nebulization daily. 01/23/18  Yes [provider]  Calcium Carbonate-Vitamin D (CALCIUM-VITAMIN D) 500-200 MG-UNIT per tablet Take 1 tablet by mouth daily.   Yes [provider]  ferrous sulfate 325 (65 FE) MG tablet Take 325 mg by mouth daily with breakfast.   Yes [provider]  formoterol (PERFOROMIST) 20 MCG/2ML nebulizer solution Inhale 20 mcg into the lungs every 12 (twelve) hours. 11/30/17  Yes [provider]  furosemide (LASIX) 40 MG tablet Take 40 mg by mouth See admin instructions. Take 80 mg in the morning and 40 mg in the evening   Yes [provider]  ipratropium-albuterol (DUONEB) 0.5-2.5 (3) MG/3ML SOLN Inhale 3 mLs into the lungs every 8 (eight) hours as needed. 08/24/19  Yes [provider]  potassium chloride (K-DUR) 10 MEQ tablet Take 10 mEq by mouth daily. 02/18/18  Yes [provider]  vitamin B-12 (CYANOCOBALAMIN) 1000 MCG tablet Take 1,000 mcg by mouth daily.   Yes [provider]  Inpatient Medications:  . acetaminophen  500 mg Oral BID  . arformoterol  15 mcg Nebulization Q12H  . budesonide  0.5 mg Nebulization BID  . furosemide  40 mg Oral BID  . ipratropium-albuterol  3 mL Inhalation [id number]  . potassium chloride  10 mEq Oral Daily  . vitamin B-12  1,000 mcg Oral Daily   . heparin 950 Units/hr  (09/01/19 0700)  . piperacillin-tazobactam (ZOSYN)  IV 12.5 mL/hr at 09/01/19 0700    Allergies: No Known Allergies  Social History   Socioeconomic History  . Marital status: Widowed    Spouse name: Not on file  . Number of children: 3  . Years of education: Not on file  . Highest education level: Not on file  Occupational History  . Occupation: Engineer--Acoustic    Comment: Retired  Tobacco Use  . Smoking status: Never Smoker  . Smokeless tobacco: Never Used  Substance and Sexual Activity  . Alcohol use: Not Currently  . Drug use: No  . Sexual activity: Not on file  Other Topics Concern  . Not on file  Social History Narrative   Widowed twice   Son and 2 daughters   Social Determinants of Health   Financial Resource Strain:   . Difficulty of Paying Living Expenses: Not on file  Food Insecurity:   . Worried About Charity fundraiser in the Last Year: Not on file  . Ran Out of Food in the Last Year: Not on file  Transportation Needs:   . Lack of Transportation (Medical): Not on file  . Lack of Transportation (Non-Medical): Not on file  Physical Activity:   . Days of Exercise per Week: Not on file  . Minutes of Exercise per Session: Not on file  Stress:   . Feeling of Stress : Not on file  Social Connections:   . Frequency of Communication with Friends and Family: Not on file  . Frequency of Social Gatherings with Friends and Family: Not on file  . Attends Religious Services: Not on file  . Active Member of Clubs or Organizations: Not on file  . Attends Archivist Meetings: Not on file  . Marital Status: Not on file  Intimate Partner Violence:   . Fear of Current or Ex-Partner: Not on file  . Emotionally Abused: Not on file  . Physically Abused: Not on file  . Sexually Abused: Not on file     Family History  Problem Relation Age of Onset  . CAD Mother   . CVA Mother      Review of Systems Positive for abdominal discomfort Negative  for: General:  chills, fever, night sweats or weight changes.  Cardiovascular: PND orthopnea syncope dizziness  Dermatological skin lesions rashes Respiratory: Cough congestion Urologic: Frequent urination urination at night and hematuria Abdominal: negative for nausea, vomiting, diarrhea, bright red blood per rectum, melena, or hematemesis Neurologic: negative for visual changes, and/or hearing changes  All other systems reviewed and are otherwise negative except as noted above.  Labs: No results for input(s): CKTOTAL, CKMB, TROPONINI in the last 72 hours. Lab Results  Component Value Date   WBC 9.9 09/01/2019   HGB 11.6 (L) 09/01/2019   HCT 37.6 09/01/2019   MCV 96.2 09/01/2019   PLT 181 09/01/2019    Recent Labs  Lab 08/31/19 1103 08/31/19 1103 09/01/19 0538  NA 142   < > 142  K 3.6   < > 3.6  CL 98   < > 100  CO2 31   < > 29  BUN 16   < > 17  CREATININE 0.99   < > 1.24*  CALCIUM 9.1   < > 8.5*  PROT 7.4  --   --   BILITOT 1.0  --   --   ALKPHOS 68  --   --   ALT 14  --   --   AST 22  --   --   GLUCOSE 131*   < > 143*   < > = values in this interval not displayed.   No results found for: CHOL, HDL, LDLCALC, TRIG No results found for: DDIMER  Radiology/Studies:  CT ABDOMEN PELVIS W CONTRAST  Result Date: 08/31/2019 CLINICAL DATA:  Onset right upper quadrant pain last night. EXAM: CT ABDOMEN AND PELVIS WITH CONTRAST TECHNIQUE: Multidetector CT imaging of the abdomen and pelvis was performed using the standard protocol following bolus administration of intravenous contrast. CONTRAST:  75 mL OMNIPAQUE IOHEXOL 300 MG/ML  SOLN COMPARISON:  None. FINDINGS: Lower chest: Lung bases clear. There is cardiomegaly. No pleural or pericardial effusion. Hepatobiliary: A 4 cm in diameter lamellated stone is seen in the gallbladder. The gallbladder is dilated and its wall appears mildly thickened. The liver and biliary tree are normal in appearance. Pancreas: Unremarkable. No  pancreatic ductal dilatation or surrounding inflammatory changes. Spleen: Normal in size without focal abnormality. Adrenals/Urinary Tract: Adrenal glands are unremarkable. Kidneys are normal, without renal calculi, focal lesion, or hydronephrosis. Bladder is unremarkable. Stomach/Bowel: Stomach is within normal limits. Appendix appears normal. No evidence of bowel wall thickening, distention, or inflammatory changes. Vascular/Lymphatic: Aortic atherosclerosis. No enlarged abdominal or pelvic lymph nodes. Reproductive: Uterus and bilateral adnexa are unremarkable. Other: None. Musculoskeletal: There is severe convex left thoracolumbar scoliosis and multilevel degenerative change. The patient is status post right hip replacement. No acute or focal abnormality. IMPRESSION: 4 cm gallstone with gallbladder wall thickening and dilatation worrisome for acute cholecystitis. Recommend right upper quadrant ultrasound for further evaluation. Atherosclerosis. Scoliosis and multilevel degenerative disease. Electronically Signed   By: Drusilla Kanner M.D.   On: 08/31/2019 12:56   US Abdomen Limited RUQ  Result Date: 08/31/2019 CLINICAL DATA:  Right upper quadrant abdominal pain. EXAM: ULTRASOUND ABDOMEN LIMITED RIGHT UPPER QUADRANT COMPARISON:  None. FINDINGS: Gallbladder: 4 cm gallstone is noted. No significant gallbladder wall thickening or pericholecystic fluid is noted. Positive sonographic Murphy's sign is noted. Common bile duct: Diameter: 3 mm which is within normal limits. Liver: No focal lesion identified. Within normal limits in parenchymal echogenicity. Portal vein is patent on color Doppler imaging with normal direction of blood flow towards the liver. Other: None. IMPRESSION: Large solitary gallstone is noted. No gallbladder wall thickening or pericholecystic fluid is noted, but positive sonographic Murphy's sign is noted. If there is concern for cholecystitis, HIDA scan is recommended for further evaluation.  Electronically Signed   By: Lupita Raider M.D.   On: 08/31/2019 14:19    EKG: Atrial fibrillation with ventricular pacing  Weights: Filed Weights   08/31/19 1100  Weight: 81.6 kg     Physical Exam: Blood pressure (!) 120/59, pulse 70, temperature 98.2 F (36.8 C), temperature source Oral, resp. rate 20, height 5\' 3"  (1.6 m), weight 81.6 kg, SpO2 94 %. Body mass index is 31.89 kg/m. General: Well developed, well nourished, in no acute distress. Head eyes ears nose throat: Normocephalic, atraumatic, sclera non-icteric, no xanthomas, nares are without discharge. No apparent thyromegaly and/or mass  Lungs: Normal respiratory effort.  no wheezes, no rales, no rhonchi.  Heart: RRR with normal S1 S2. no murmur gallop, no rub, PMI is normal size and placement, carotid upstroke normal without bruit, jugular venous pressure is normal Abdomen: Soft, non-tender, non-distended with normoactive bowel sounds. No hepatomegaly. No rebound/guarding. No obvious abdominal masses. Abdominal aorta is normal size without bruit Extremities: Trace edema. no cyanosis, no clubbing, no ulcers  Peripheral : 2+ bilateral upper extremity pulses, 2+ bilateral femoral pulses, 2+ bilateral dorsal pedal pulse Neuro: Alert and oriented. No facial asymmetry. No focal deficit. Moves all extremities spontaneously. Musculoskeletal: Normal muscle tone without kyphosis Psych:  Responds to questions appropriately with a normal affect.    Assessment: 86 year old female with chronic systolic dysfunction congestive heart failure without evidence of myocardial infarction or congestive heart failure exacerbation chronic nonvalvular atrial fibrillation controlled with cholecystitis  Plan: 1.  Continue conservative care of early cholecystitis with antibiotics and fluids 2.  Okay for proceeding on for surgical intervention as necessary for gallbladder disease due to patient at lowest risk possible at this time and currently stable  with atrial fibrillation and chronic systolic dysfunction heart failure without evidence of exacerbation 3.  Abstain from anticoagulation orally at this time due to concerns of bleeding complications with surgical intervention and use heparin intermittently as able 4.  Continue fluid management with Lasix as before orally 5.  No further cardiac diagnostics necessary at this time  Signed, Lamar Blinks M.D. [facility] 09/01/2019, 8:29 AM

## 2019-09-02 ENCOUNTER — Inpatient Hospital Stay
Admit: 2019-09-02 | Discharge: 2019-09-02 | Disposition: A | Discharge status: Home / Self Care | Payer: Medicare Other | Attending: Internal Medicine | Admitting: Internal Medicine

## 2019-09-02 ENCOUNTER — Inpatient Hospital Stay: Payer: Medicare Other

## 2019-09-02 DIAGNOSIS — K8000 Calculus of gallbladder with acute cholecystitis without obstruction: Secondary | ICD-10-CM | POA: Diagnosis not present

## 2019-09-02 DIAGNOSIS — K810 Acute cholecystitis: Secondary | ICD-10-CM | POA: Diagnosis not present

## 2019-09-02 DIAGNOSIS — R1011 Right upper quadrant pain: Secondary | ICD-10-CM | POA: Diagnosis not present

## 2019-09-02 LAB — COMPREHENSIVE METABOLIC PANEL
ALT: 139 U/L — ABNORMAL HIGH (ref 0–44)
AST: 140 U/L — ABNORMAL HIGH (ref 15–41)
Albumin: 3.3 g/dL — ABNORMAL LOW (ref 3.5–5.0)
Alkaline Phosphatase: 141 U/L — ABNORMAL HIGH (ref 38–126)
Anion gap: 11 (ref 5–15)
BUN: 24 mg/dL — ABNORMAL HIGH (ref 8–23)
CO2: 31 mmol/L (ref 22–32)
Calcium: 8.6 mg/dL — ABNORMAL LOW (ref 8.9–10.3)
Chloride: 98 mmol/L (ref 98–111)
Creatinine, Ser: 1.37 mg/dL — ABNORMAL HIGH (ref 0.44–1.00)
GFR calc Af Amer: 39 mL/min — ABNORMAL LOW (ref >60)
GFR calc non Af Amer: 34 mL/min — ABNORMAL LOW (ref >60)
Glucose, Bld: 162 mg/dL — ABNORMAL HIGH (ref 70–99)
Potassium: 3.5 mmol/L (ref 3.5–5.1)
Sodium: 140 mmol/L (ref 135–145)
Total Bilirubin: 1.6 mg/dL — ABNORMAL HIGH (ref 0.3–1.2)
Total Protein: 6.4 g/dL — ABNORMAL LOW (ref 6.5–8.1)

## 2019-09-02 LAB — ECHOCARDIOGRAM COMPLETE
Height: 63 in
Weight: 2880 oz

## 2019-09-02 LAB — CBC
HCT: 36 % (ref 36.0–46.0)
Hemoglobin: 11 g/dL — ABNORMAL LOW (ref 12.0–15.0)
MCH: 29.3 pg (ref 26.0–34.0)
MCHC: 30.6 g/dL (ref 30.0–36.0)
MCV: 96 fL (ref 80.0–100.0)
Platelets: 154 10*3/uL (ref 150–400)
RBC: 3.75 MIL/uL — ABNORMAL LOW (ref 3.87–5.11)
RDW: 13.1 % (ref 11.5–15.5)
WBC: 13.7 10*3/uL — ABNORMAL HIGH (ref 4.0–10.5)
nRBC: 0 % (ref 0.0–0.2)

## 2019-09-02 LAB — HEPARIN LEVEL (UNFRACTIONATED): Heparin Unfractionated: 2.3 IU/mL — ABNORMAL HIGH (ref 0.30–0.70)

## 2019-09-02 LAB — APTT: aPTT: 92 seconds — ABNORMAL HIGH (ref 24–36)

## 2019-09-02 LAB — LACTIC ACID, PLASMA: Lactic Acid, Venous: 1.7 mmol/L (ref 0.5–1.9)

## 2019-09-02 MED ORDER — MIDAZOLAM HCL 5 MG/5ML IJ SOLN
INTRAMUSCULAR | Status: AC | PRN
  Administered 2019-09-02: 1 mg via INTRAVENOUS
  Administered 2019-09-02: 0.5 mg via INTRAVENOUS

## 2019-09-02 MED ORDER — SIMETHICONE 40 MG/0.6ML PO SUSP
40.0000 mg | Freq: Four times a day (QID) | ORAL | Status: DC | PRN
  Administered 2019-09-03: 40 mg via ORAL
  Filled 2019-09-02: qty 0.6
  Filled 2019-09-02: qty 30
  Filled 2019-09-02: qty 0.6

## 2019-09-02 MED ORDER — FENTANYL CITRATE (PF) 100 MCG/2ML IJ SOLN
INTRAMUSCULAR | Status: AC | PRN
  Administered 2019-09-02: 50 ug via INTRAVENOUS
  Administered 2019-09-02: 25 ug via INTRAVENOUS

## 2019-09-02 MED ORDER — BISACODYL 10 MG RE SUPP: 10.0000 mg | Freq: Every day | RECTAL | Status: DC | PRN

## 2019-09-02 MED ORDER — FENTANYL CITRATE (PF) 100 MCG/2ML IJ SOLN
INTRAMUSCULAR | Status: AC
  Filled 2019-09-02: qty 4

## 2019-09-02 MED ORDER — SENNOSIDES-DOCUSATE SODIUM 8.6-50 MG PO TABS
1.0000 | ORAL_TABLET | Freq: Two times a day (BID) | ORAL | Status: DC
  Administered 2019-09-02 (×2): 1 via ORAL
  Filled 2019-09-02 (×2): qty 1

## 2019-09-02 MED ORDER — POTASSIUM CHLORIDE 10 MEQ/100ML IV SOLN
10.0000 meq | INTRAVENOUS | Status: AC
  Administered 2019-09-02 (×3): 10 meq via INTRAVENOUS
  Filled 2019-09-02 (×3): qty 100

## 2019-09-02 MED ORDER — SODIUM CHLORIDE 0.9% FLUSH
5.0000 mL | Freq: Three times a day (TID) | INTRAVENOUS | Status: DC
  Administered 2019-09-02 – 2019-09-05 (×8): 5 mL

## 2019-09-02 MED ORDER — MIDAZOLAM HCL 5 MG/5ML IJ SOLN
INTRAMUSCULAR | Status: AC
  Filled 2019-09-02: qty 5

## 2019-09-02 NOTE — Consult Note (Signed)
ANTICOAGULATION CONSULT NOTE  Pharmacy Consult for Heparin Indication: atrial fibrillation  No Known Allergies  Patient Measurements: Height: 5\' 3"  (160 cm) Weight: 180 lb (81.6 kg) IBW/kg (Calculated) : 52.4 Heparin Dosing Weight: 70.3 kg  Vital Signs: Temp: 97.4 F (36.3 C) (02/14 0514) Temp Source: Axillary (02/14 0514) BP: 113/56 (02/14 0514) Pulse Rate: 70 (02/14 0514)  Labs: Recent Labs    08/31/19 1103 08/31/19 1758 09/01/19 0538 09/01/19 0538 09/01/19 1320 09/01/19 2234 09/02/19 0456  HGB   < >  --  11.6*   < >  --  10.9* 11.0*  HCT   < >  --  37.6  --   --  35.1* 36.0  PLT   < >  --  181  --   --  167 154  APTT   < > 33 97*  --  100*  --  92*  LABPROT  --  16.5*  --   --   --   --   --   INR  --  1.3*  --   --   --   --   --   HEPARINUNFRC  --  >3.60* >3.60*  --   --   --  2.30*  CREATININE   < >  --  1.24*  --   --  1.13* 1.37*   < > = values in this interval not displayed.    Estimated Creatinine Clearance: 27.6 mL/min (A) (by C-G formula based on SCr of 1.37 mg/dL (H)).   Medical History: Past Medical History:  Diagnosis Date  . Atrial fibrillation ([place])   . Chronic systolic heart failure ([place])   . COPD (chronic obstructive pulmonary disease) ([place])   . Osteoarthritis, multiple sites   . Osteoporosis, post-menopausal   . Presence of permanent cardiac pacemaker     Medications:  Medications Prior to Admission  Medication Sig Dispense Refill Last Dose  . acetaminophen (TYLENOL) 500 MG tablet Take 500 mg by mouth in the morning and at bedtime. Also if needed   08/30/2019 at unknown  . apixaban (ELIQUIS) 5 MG TABS tablet Take 2 tablets (10 mg total) by mouth 2 (two) times daily for 7 days. Take apixaban 10 mg twice daily for 7 days followed by 5 mg p.o. twice daily after that. (Patient taking differently: Take 5 mg by mouth 2 (two) times daily. ) 60 tablet 1 08/31/2019 at 0830  . budesonide (PULMICORT) 0.5 MG/2ML nebulizer solution Take 0.5 mg by  nebulization daily.  3 08/31/2019 at 0900  . Calcium Carbonate-Vitamin D (CALCIUM-VITAMIN D) 500-200 MG-UNIT per tablet Take 1 tablet by mouth daily.   08/30/2019 at Unknown time  . ferrous sulfate 325 (65 FE) MG tablet Take 325 mg by mouth daily with breakfast.   08/30/2019 at Unknown time  . formoterol (PERFOROMIST) 20 MCG/2ML nebulizer solution Inhale 20 mcg into the lungs every 12 (twelve) hours.   08/31/2019 at 0900  . furosemide (LASIX) 40 MG tablet Take 40 mg by mouth See admin instructions. Take 80 mg in the morning and 40 mg in the evening   08/31/2019 at 0830  . ipratropium-albuterol (DUONEB) 0.5-2.5 (3) MG/3ML SOLN Inhale 3 mLs into the lungs every 8 (eight) hours as needed.   prn at prn  . potassium chloride (K-DUR) 10 MEQ tablet Take 10 mEq by mouth daily.  3 08/30/2019 at unknown  . vitamin B-12 (CYANOCOBALAMIN) 1000 MCG tablet Take 1,000 mcg by mouth daily.   08/30/2019 at Unknown time  Scheduled:  . acetaminophen  500 mg Oral BID  . arformoterol  15 mcg Nebulization Q12H  . budesonide  0.5 mg Nebulization BID  . ipratropium-albuterol  3 mL Inhalation [id number]  . polyethylene glycol  17 g Oral Daily  . vitamin B-12  1,000 mcg Oral Daily   Infusions:  . heparin 950 Units/hr (09/01/19 1800)  . piperacillin-tazobactam (ZOSYN)  IV 3.375 g (09/02/19 0659)   PRN: HYDROmorphone (DILAUDID) injection, ondansetron **OR** ondansetron (ZOFRAN) IV, oxyCODONE Anti-infectives (From admission, onward)   Start     Dose/Rate Route Frequency Ordered Stop   08/31/19 2200  piperacillin-tazobactam (ZOSYN) IVPB 3.375 g     3.375 g 12.5 mL/hr over 240 Minutes Intravenous Every 8 hours 08/31/19 1605     08/31/19 1330  piperacillin-tazobactam (ZOSYN) IVPB 3.375 g     3.375 g 100 mL/hr over 30 Minutes Intravenous  Once 08/31/19 1315 08/31/19 1503      Assessment: Pharmacy consulted to start heparin for afib. Pt was previously on apixaban. Will order baseline APTT, INR, and heparin level. Most  likely use APTT for monitor if heparin level is falsely elevated.   2/13 0538 aPTT 97 HL > 3.60 2/13 1320 aPTT 100 2/14 0456 aPTT 92 HL 2.6   Goal of Therapy:  APTT 66-102 seconds Heparin level 0.3-0.7 units/ml, once aPTT and Heparin level correlate.  Monitor platelets by anticoagulation protocol: Yes   Plan:  APTT therapeutic. Will continue current rate and will recheck aPTT, heparin level, and CBC with AM labs. Dose off aPTT until HL correlates, CBC trending down, likely dilutional will continue to monitor.  Ronnald Ramp, PharmD, BCPS 09/02/2019,8:13 AM

## 2019-09-02 NOTE — Progress Notes (Signed)
Patient ID: Diana Powell, female   DOB: 01-08-1927, 86 y.o.   MRN: [id number] [facility] PROGRESS NOTE  Jaydee Dachel QMV:[id number] DOB: 1927/03/22 DOA: 08/31/2019 PCP: Lollie Marrow, MD  HPI/Subjective: Has nausea no vomiting no fever no chills.  No chest pain but has abdominal pain.  Reports bloating.  Passing gas.  No BM.  Patient's son interpreted for the patient  Objective: Vitals:   09/02/19 1611 09/02/19 1802  BP: 123/61 132/77  Pulse: 70 70  Resp:  18  Temp: 98.4 F (36.9 C) 98.2 F (36.8 C)  SpO2: 91% 96%    Intake/Output Summary (Last 24 hours) at 09/02/2019 1927 Last data filed at 09/02/2019 1600 Gross per 24 hour  Intake 311.48 ml  Output 320 ml  Net -8.52 ml   Filed Weights   08/31/19 1100  Weight: 81.6 kg    ROS: Review of Systems  Constitutional: Negative for fever.  HENT: Negative for sore throat.   Respiratory: Positive for shortness of breath.   Cardiovascular: Negative for chest pain.  Gastrointestinal: Positive for abdominal pain and nausea. Negative for vomiting.  Genitourinary: Negative for dysuria.  Musculoskeletal: Negative for joint pain.   Exam: Physical Exam  HENT:  Nose: No mucosal edema.  Mouth/Throat: No oropharyngeal exudate or posterior oropharyngeal edema.  Eyes: Conjunctivae and lids are normal.  Neck: Carotid bruit is not present.  Cardiovascular: Regular rhythm, S1 normal and S2 normal. Exam reveals no gallop.  Murmur heard.  Systolic murmur is present with a grade of 2/6. Pulses:      Dorsalis pedis pulses are 2+ on the right side and 2+ on the left side.  Respiratory: No respiratory distress. She has no wheezes. She has no rhonchi. She has no rales.  GI: Soft. Bowel sounds are normal. There is abdominal tenderness in the right upper quadrant.  Musculoskeletal:     Right ankle: No swelling.     Left ankle: No swelling.  Lymphadenopathy:    She has no cervical adenopathy.  Neurological: She is alert. No cranial  nerve deficit.  Skin: Skin is warm. No rash noted. Nails show no clubbing.  Psychiatric: She has a normal mood and affect.      Data Reviewed: Basic Metabolic Panel: Recent Labs  Lab 08/31/19 1103 09/01/19 0538 09/01/19 2234 09/02/19 0456  NA 142 142 140 140  K 3.6 3.6 3.5 3.5  CL 98 100 97* 98  CO2 31 29 29 31   GLUCOSE 131* 143* 167* 162*  BUN 16 17 19  24*  CREATININE 0.99 1.24* 1.13* 1.37*  CALCIUM 9.1 8.5* 8.4* 8.6*   Liver Function Tests: Recent Labs  Lab 08/31/19 1103 09/01/19 2234 09/02/19 0456  AST 22 171* 140*  ALT 14 150* 139*  ALKPHOS 68 154* 141*  BILITOT 1.0 1.8* 1.6*  PROT 7.4 6.9 6.4*  ALBUMIN 3.9 3.5 3.3*   Recent Labs  Lab 08/31/19 1103 09/01/19 2234  LIPASE 28 23   CBC: Recent Labs  Lab 08/31/19 1103 09/01/19 0538 09/01/19 2234 09/02/19 0456  WBC 7.4 9.9 13.5* 13.7*  HGB 12.4 11.6* 10.9* 11.0*  HCT 39.6 37.6 35.1* 36.0  MCV 93.8 96.2 94.6 96.0  PLT 206 181 167 154     Recent Results (from the past 240 hour(s))  Respiratory Panel by RT PCR (Flu A&B, Covid) - Nasopharyngeal Swab     Status: None   Collection Time: 08/31/19  4:36 PM   Specimen: Nasopharyngeal Swab  Result Value Ref Range Status   SARS  Coronavirus 2 by RT PCR NEGATIVE NEGATIVE Final    Comment: (NOTE) SARS-CoV-2 target nucleic acids are NOT DETECTED. The SARS-CoV-2 RNA is generally detectable in upper respiratoy specimens during the acute phase of infection. The lowest concentration of SARS-CoV-2 viral copies this assay can detect is 131 copies/mL. A negative result does not preclude SARS-Cov-2 infection and should not be used as the sole basis for treatment or other patient management decisions. A negative result may occur with  improper specimen collection/handling, submission of specimen other than nasopharyngeal swab, presence of viral mutation(s) within the areas targeted by this assay, and inadequate number of viral copies (<131 copies/mL). A negative  result must be combined with clinical observations, patient history, and epidemiological information. The expected result is Negative. Fact Sheet for Patients:  https://www.moore.com/ Fact Sheet for Healthcare Providers:  https://www.young.biz/ This test is not yet ap proved or cleared by the [place] FDA and  has been authorized for detection and/or diagnosis of SARS-CoV-2 by FDA under an Emergency Use Authorization (EUA). This EUA will remain  in effect (meaning this test can be used) for the duration of the COVID-19 declaration under Section 564(b)(1) of the Act, 21 U.S.C. section 360bbb-3(b)(1), unless the authorization is terminated or revoked sooner.    Influenza A by PCR NEGATIVE NEGATIVE Final   Influenza B by PCR NEGATIVE NEGATIVE Final    Comment: (NOTE) The Xpert Xpress SARS-CoV-2/FLU/RSV assay is intended as an aid in  the diagnosis of influenza from Nasopharyngeal swab specimens and  should not be used as a sole basis for treatment. Nasal washings and  aspirates are unacceptable for Xpert Xpress SARS-CoV-2/FLU/RSV  testing. Fact Sheet for Patients: https://www.moore.com/ Fact Sheet for Healthcare Providers: https://www.young.biz/ This test is not yet approved or cleared by the [place] FDA and  has been authorized for detection and/or diagnosis of SARS-CoV-2 by  FDA under an Emergency Use Authorization (EUA). This EUA will remain  in effect (meaning this test can be used) for the duration of the  Covid-19 declaration under Section 564(b)(1) of the Act, 21  U.S.C. section 360bbb-3(b)(1), unless the authorization is  terminated or revoked. Performed at [facility], [address], [place], [place] [address]   Urine Culture     Status: None (Preliminary result)   Collection Time: 08/31/19  9:22 PM   Specimen: Urine, Clean Catch  Result Value Ref Range Status   Specimen  Description   Final    URINE, CLEAN CATCH Performed at [facility], [address], [place], [place] [address]    Special Requests   Final    Normal Performed at [facility], [address], [place], [place] [address]    Culture   Final    NO GROWTH Performed at [facility], [address] [place], [place] [address]    Report Status PENDING  Incomplete     Studies: DG Abd 1 View  Result Date: 09/01/2019 CLINICAL DATA:  Abdominal pain, cholelithiasis EXAM: ABDOMEN - 1 VIEW COMPARISON:  08/31/2019 FINDINGS: Supine frontal view of the abdomen and pelvis demonstrates an unremarkable bowel gas pattern. The radiopaque gallstone seen on recent CT is not well visualized on this exam, due to respiratory motion and positioning. There are no abdominal masses. Right hip arthroplasty is noted. Severe left convex scoliosis of the thoracolumbar spine. IMPRESSION: 1. Unremarkable bowel gas pattern. 2. Please refer to recent CT and ultrasound examinations describing cholelithiasis. Electronically Signed   By: Maxwell Caul.D.  On: 09/01/2019 22:57   ECHOCARDIOGRAM COMPLETE  Result Date: 09/02/2019    ECHOCARDIOGRAM REPORT   Patient Name:   RUE VALLADARES Date of Exam: 09/02/2019 Medical Rec #:  [id number]     Height:       63.0 in Accession #:    [id number]    Weight:       180.0 lb Date of Birth:  1926/07/27     BSA:          1.85 m Patient Age:    89 years      BP:           113/56 mmHg Patient Gender: F             HR:           70 bpm. Exam Location:  [facility] Procedure: 2D Echo, Cardiac Doppler and Color Doppler Indications:     A-fib 427.30  History:         Patient has no prior history of Echocardiogram examinations.                  CHF; Arrythmias:Atrial Fibrillation.  Sonographer:     Neysa Bonito Roar Referring Phys:  [id number] Alford Highland Diagnosing Phys: Arnoldo Hooker MD IMPRESSIONS  1. Septal dysfunction likely due to pacer stimulation. Left ventricular ejection fraction,  by estimation, is 45 to 50%. The left ventricle has mildly decreased function. The left ventricle demonstrates regional wall motion abnormalities (see scoring diagram/findings for description). There is mildly increased left ventricular hypertrophy. Left ventricular diastolic parameters were normal.  2. Right ventricular systolic function is normal. The right ventricular size is normal. There is moderately elevated pulmonary artery systolic pressure.  3. Left atrial size was moderately dilated.  4. Right atrial size was moderately dilated.  5. The mitral valve is normal in structure and function. Moderate mitral valve regurgitation.  6. Tricuspid valve regurgitation is moderate.  7. The aortic valve is grossly normal. Aortic valve regurgitation is mild. Mild aortic valve stenosis. FINDINGS  Left Ventricle: Septal dysfunction likely due to pacer stimulation. Left ventricular ejection fraction, by estimation, is 45 to 50%. The left ventricle has mildly decreased function. The left ventricle demonstrates regional wall motion abnormalities. There is mildly increased left ventricular hypertrophy. Left ventricular diastolic parameters were normal. Right Ventricle: The right ventricular size is normal. No increase in right ventricular wall thickness. Right ventricular systolic function is normal. There is moderately elevated pulmonary artery systolic pressure. The tricuspid regurgitant velocity is 3.27 m/s, and with an assumed right atrial pressure of 10 mmHg, the estimated right ventricular systolic pressure is 52.8 mmHg. Left Atrium: Left atrial size was moderately dilated. Right Atrium: Right atrial size was moderately dilated. Pericardium: There is no evidence of pericardial effusion. Mitral Valve: The mitral valve is normal in structure and function. Moderate mitral valve regurgitation. Tricuspid Valve: The tricuspid valve is normal in structure. Tricuspid valve regurgitation is moderate. Aortic Valve: The aortic  valve is grossly normal. Aortic valve regurgitation is mild. Mild aortic stenosis is present. Aortic valve mean gradient measures 9.3 mmHg. Aortic valve peak gradient measures 18.8 mmHg. Aortic valve area, by VTI measures 0.93 cm. Pulmonic Valve: The pulmonic valve was normal in structure. Pulmonic valve regurgitation is not visualized. Aorta: The aortic root is normal in size and structure. IAS/Shunts: No atrial level shunt detected by color flow Doppler.  LEFT VENTRICLE PLAX 2D LVIDd:         4.40 cm  Diastology LVIDs:  3.19 cm  LV e' lateral:   10.20 cm/s LV PW:         1.09 cm  LV E/e' lateral: 15.1 LV IVS:        1.17 cm  LV e' medial:    6.64 cm/s LVOT diam:     1.60 cm  LV E/e' medial:  23.2 LV SV:         33.38 ml LV SV Index:   24.30 LVOT Area:     2.01 cm  RIGHT VENTRICLE RV Mid diam:    3.06 cm RV S prime:     11.90 cm/s LEFT ATRIUM             Index       RIGHT ATRIUM           Index LA diam:        4.70 cm 2.54 cm/m  RA Area:     21.10 cm LA Vol (A2C):   92.7 ml 50.14 ml/m RA Volume:   68.50 ml  37.05 ml/m LA Vol (A4C):   69.5 ml 37.59 ml/m LA Biplane Vol: 82.2 ml 44.46 ml/m  AORTIC VALVE                    PULMONIC VALVE AV Area (Vmax):    0.90 cm     PV Vmax:        0.86 m/s AV Area (Vmean):   0.85 cm     PV Peak grad:   2.9 mmHg AV Area (VTI):     0.93 cm     RVOT Peak grad: 3 mmHg AV Vmax:           217.00 cm/s AV Vmean:          136.333 cm/s AV VTI:            0.357 m AV Peak Grad:      18.8 mmHg AV Mean Grad:      9.3 mmHg LVOT Vmax:         96.90 cm/s LVOT Vmean:        57.400 cm/s LVOT VTI:          0.166 m LVOT/AV VTI ratio: 0.46  AORTA Ao Root diam: 2.60 cm MITRAL VALVE                TRICUSPID VALVE MV Area (PHT): 4.36 cm     TR Peak grad:   42.8 mmHg MV Decel Time: 174 msec     TR Vmax:        327.00 cm/s MV E velocity: 154.00 cm/s                             SHUNTS                             Systemic VTI:  0.17 m                             Systemic Diam: 1.60 cm Arnoldo Hooker MD Electronically signed by Arnoldo Hooker MD Signature Date/Time: 09/02/2019/4:22:46 PM    Final     Scheduled Meds: . arformoterol  15 mcg Nebulization Q12H  . budesonide  0.5 mg Nebulization BID  . fentaNYL      . ipratropium-albuterol  3 mL Inhalation [id number]  . midazolam      .  polyethylene glycol  17 g Oral Daily  . senna-docusate  1 tablet Oral BID  . vitamin B-12  1,000 mcg Oral Daily   Continuous Infusions: . heparin Stopped (09/02/19 0957)  . piperacillin-tazobactam (ZOSYN)  IV 12.5 mL/hr at 09/02/19 1600    Assessment/Plan:  1. Acute cholecystitis.  Patient on empiric Zosyn.  Patient still having abdominal pain.  As needed oral and IV pain medications ordered.  General surgery requested IR guided PERC drain placement.  Appreciate their assistance.  Supportive measures ordered as well. 2. Paroxysmal atrial fibrillation.  Patient is on heparin drip currently to prevent stroke while holding Eliquis.  Patient not on any rate controlling medications. 3. Chronic systolic congestive heart failure.  Echocardiogram ordered.  Holding fluids.  Discontinue Lasix. 4. Acute hypoxic respiratory failure with COPD.  Continue nebulizer treatments.  Currently on oxygen.  Continue to monitor respiratory status. 5. Acute kidney injury on chronic kidney disease stage III.  Hold Lasix for now.  Monitor. 6. We will get physical therapy evaluation  Code Status:     Code Status Orders  (From admission, onward)         Start     Ordered   08/31/19 1609  Full code  Continuous     08/31/19 1609        Code Status History    Date Active Date Inactive Code Status Order ID Comments User Context   04/29/2018 0052 05/01/2018 1459 Full Code [id number]  Amelia Jo, MD ED   04/20/2018 1512 04/26/2018 1539 Full Code [id number]  Hessie Knows, MD Inpatient   Advance Care Planning Activity     Family Communication: Son at the bedside Disposition Plan: Monitor response to percutaneous  drain and further work-up.  Consultants:  General surgery  cardiology  Time spent: 27 minutes  [place]

## 2019-09-02 NOTE — Progress Notes (Signed)
PT Cancellation Note  Patient Details Name: Mayzie Caughlin MRN: [id number] DOB: 25-Nov-1926   Cancelled Treatment:    Reason Eval/Treat Not Completed: Patient declined, no reason specified,    Ezekiel Ina, PT DPT 09/02/2019, 12:34 PM

## 2019-09-02 NOTE — Procedures (Signed)
Interventional Radiology Procedure Note  Procedure: Percutaneous cholecystostomy  Complications: None  Estimated Blood Loss: < 10 mL  Findings: 10 Fr percutaneous cholecystostomy tube placed in GB lumen. Abuts/partially wraps around the large calculus in GB. Initially dark bile aspirated with sample sent for culture. Now some bloody drainage. Will leave to gravity bag drainage and irrigate.  Jodi Marble. Fredia Sorrow, M.D Pager:  [phone]

## 2019-09-02 NOTE — Progress Notes (Signed)
*  PRELIMINARY RESULTS* Echocardiogram 2D Echocardiogram has been performed.  Diana Powell 09/02/2019, 1:36 PM

## 2019-09-02 NOTE — Consult Note (Addendum)
ANTICOAGULATION CONSULT NOTE  Pharmacy Consult for Heparin Indication: atrial fibrillation  No Known Allergies  Patient Measurements: Height: 5\' 3"  (160 cm) Weight: 180 lb (81.6 kg) IBW/kg (Calculated) : 52.4 Heparin Dosing Weight: 70.3 kg  Vital Signs: Temp: 97.4 F (36.3 C) (02/14 0514) Temp Source: Axillary (02/14 0514) BP: 113/56 (02/14 0514) Pulse Rate: 70 (02/14 0514)  Labs: Recent Labs    08/31/19 1103 08/31/19 1758 09/01/19 0538 09/01/19 0538 09/01/19 1320 09/01/19 2234 09/02/19 0456  HGB   < >  --  11.6*   < >  --  10.9* 11.0*  HCT   < >  --  37.6  --   --  35.1* 36.0  PLT   < >  --  181  --   --  167 154  APTT   < > 33 97*  --  100*  --  92*  LABPROT  --  16.5*  --   --   --   --   --   INR  --  1.3*  --   --   --   --   --   HEPARINUNFRC  --  >3.60* >3.60*  --   --   --   --   CREATININE   < >  --  1.24*  --   --  1.13* 1.37*   < > = values in this interval not displayed.    Estimated Creatinine Clearance: 27.6 mL/min (A) (by C-G formula based on SCr of 1.37 mg/dL (H)).   Medical History: Past Medical History:  Diagnosis Date  . Atrial fibrillation (HCC)   . Chronic systolic heart failure (HCC)   . COPD (chronic obstructive pulmonary disease) (HCC)   . Osteoarthritis, multiple sites   . Osteoporosis, post-menopausal   . Presence of permanent cardiac pacemaker     Medications:  Medications Prior to Admission  Medication Sig Dispense Refill Last Dose  . acetaminophen (TYLENOL) 500 MG tablet Take 500 mg by mouth in the morning and at bedtime. Also if needed   08/30/2019 at unknown  . apixaban (ELIQUIS) 5 MG TABS tablet Take 2 tablets (10 mg total) by mouth 2 (two) times daily for 7 days. Take apixaban 10 mg twice daily for 7 days followed by 5 mg p.o. twice daily after that. (Patient taking differently: Take 5 mg by mouth 2 (two) times daily. ) 60 tablet 1 08/31/2019 at 0830  . budesonide (PULMICORT) 0.5 MG/2ML nebulizer solution Take 0.5 mg by  nebulization daily.  3 08/31/2019 at 0900  . Calcium Carbonate-Vitamin D (CALCIUM-VITAMIN D) 500-200 MG-UNIT per tablet Take 1 tablet by mouth daily.   08/30/2019 at Unknown time  . ferrous sulfate 325 (65 FE) MG tablet Take 325 mg by mouth daily with breakfast.   08/30/2019 at Unknown time  . formoterol (PERFOROMIST) 20 MCG/2ML nebulizer solution Inhale 20 mcg into the lungs every 12 (twelve) hours.   08/31/2019 at 0900  . furosemide (LASIX) 40 MG tablet Take 40 mg by mouth See admin instructions. Take 80 mg in the morning and 40 mg in the evening   08/31/2019 at 0830  . ipratropium-albuterol (DUONEB) 0.5-2.5 (3) MG/3ML SOLN Inhale 3 mLs into the lungs every 8 (eight) hours as needed.   prn at prn  . potassium chloride (K-DUR) 10 MEQ tablet Take 10 mEq by mouth daily.  3 08/30/2019 at unknown  . vitamin B-12 (CYANOCOBALAMIN) 1000 MCG tablet Take 1,000 mcg by mouth daily.   08/30/2019 at Unknown  time   Scheduled:  . acetaminophen  500 mg Oral BID  . arformoterol  15 mcg Nebulization Q12H  . budesonide  0.5 mg Nebulization BID  . furosemide  40 mg Oral Daily  . ipratropium-albuterol  3 mL Inhalation [id number]  . polyethylene glycol  17 g Oral Daily  . potassium chloride  10 mEq Oral Daily  . vitamin B-12  1,000 mcg Oral Daily   Infusions:  . heparin 950 Units/hr (09/01/19 1800)  . piperacillin-tazobactam (ZOSYN)  IV 3.375 g (09/01/19 2153)  . potassium chloride 10 mEq (09/02/19 0551)   PRN: HYDROmorphone (DILAUDID) injection, ondansetron **OR** ondansetron (ZOFRAN) IV, oxyCODONE Anti-infectives (From admission, onward)   Start     Dose/Rate Route Frequency Ordered Stop   08/31/19 2200  piperacillin-tazobactam (ZOSYN) IVPB 3.375 g     3.375 g 12.5 mL/hr over 240 Minutes Intravenous Every 8 hours 08/31/19 1605     08/31/19 1330  piperacillin-tazobactam (ZOSYN) IVPB 3.375 g     3.375 g 100 mL/hr over 30 Minutes Intravenous  Once 08/31/19 1315 08/31/19 1503      Assessment: Pharmacy  consulted to start heparin for afib. Pt was previously on apixaban. Will order baseline APTT, INR, and heparin level. Most likely use APTT for monitor if heparin level is falsely elevated.   2/13 0538 aPTT 97 HL > 3.60 2/13 1320 aPTT 100 2/14 0456 aPTT 92, HL 2.30  Goal of Therapy:  APTT 66-102 seconds Heparin level 0.3-0.7 units/ml, once aPTT and Heparin level correlate.  Monitor platelets by anticoagulation protocol: Yes   Plan:  APTT therapeutic. Heparin level still supratherapeutic. Will continue current rate of heparin 950 units/hr. CBC stable. Will recheck  aPTT, heparin level, and CBC with AM labs. Dose off aPTT until HL correlates, CBC trending down, likely dilutional will continue to monitor.  Pernell Dupre, PharmD, BCPS Clinical Pharmacist 09/02/2019 6:27 AM

## 2019-09-02 NOTE — Progress Notes (Signed)
09/02/2019  Subjective: Last night patient started having more pain again and felt very bloated and distended.  Labs and KUB were obatined.  KUB overall unremarkable, but her labs showed an elevated WBC of 13.5, with also mildly elevated LFTs.  Today, her WBC remains elevated and her LFTs as well.  She continues having pain.  Vital signs: Temp:  [97.4 F (36.3 C)-98.7 F (37.1 C)] 97.4 F (36.3 C) (02/14 0514) Pulse Rate:  [70] 70 (02/14 0514) Resp:  [16-18] 16 (02/14 0514) BP: (113-163)/(56-77) 113/56 (02/14 0514) SpO2:  [82 %-100 %] 100 % (02/14 0840)   Intake/Output: 02/13 0701 - 02/14 0700 In: 1009.9 [P.O.:840; I.V.:102.3; IV Piggyback:67.6] Out: 400 [Urine:400] Last BM Date: 08/30/19  Physical Exam: Constitutional: No acute distress Abdomen:  Soft, mildly distended, now with more tenderness in RUQ compared to prior exam.  Labs:  Recent Labs    09/01/19 2234 09/02/19 0456  WBC 13.5* 13.7*  HGB 10.9* 11.0*  HCT 35.1* 36.0  PLT 167 154   Recent Labs    09/01/19 2234 09/02/19 0456  NA 140 140  K 3.5 3.5  CL 97* 98  CO2 29 31  GLUCOSE 167* 162*  BUN 19 24*  CREATININE 1.13* 1.37*  CALCIUM 8.4* 8.6*   Recent Labs    08/31/19 1758  LABPROT 16.5*  INR 1.3*    Imaging: DG Abd 1 View  Result Date: 09/01/2019 CLINICAL DATA:  Abdominal pain, cholelithiasis EXAM: ABDOMEN - 1 VIEW COMPARISON:  08/31/2019 FINDINGS: Supine frontal view of the abdomen and pelvis demonstrates an unremarkable bowel gas pattern. The radiopaque gallstone seen on recent CT is not well visualized on this exam, due to respiratory motion and positioning. There are no abdominal masses. Right hip arthroplasty is noted. Severe left convex scoliosis of the thoracolumbar spine. IMPRESSION: 1. Unremarkable bowel gas pattern. 2. Please refer to recent CT and ultrasound examinations describing cholelithiasis. Electronically Signed   By: Sharlet Salina M.D.   On: 09/01/2019 22:57     Assessment/Plan: This is a 86 y.o. female with acute cholecystitis.  --Discussed with the patient and her son that there is a change in her condition, despite of her being on antibiotics without significant po challenge of only liquids/water.  Her last dose of Eliquis was on 2/12, so she's only been off Eliquis yesterday.  At this point, I think it would be riskier to proceed with surgery for her gallbladder today instead of tomorrow as originally planned.  Discussed with Dr. Fredia Sorrow with IR and he's willing to do a percutaneous cholecystostomy tube for her.  --Discussed with her son that the cardiology team deemed the patient appropriate for surgery.  Given this, the drain would just be temporary as a bridge to surgery in the future.  We would do an elective interval cholecystectomy in about 6-8 weeks. --Patient and her son understand this plan and their questions have been answered.   Howie Ill, MD [facility]

## 2019-09-02 NOTE — Progress Notes (Signed)
The patients son was concerned that the patient had not have a BM since 08/30/19. Dr Aleen Campi is aware. We will add dulcolax to the regimen

## 2019-09-02 NOTE — Progress Notes (Signed)
[facility] Cardiology [facility] Encounter Note  Patient: Diana Powell / Admit Date: 08/31/2019 / Date of Encounter: 09/02/2019, 7:19 AM   Subjective: Patient did well overnight with no evidence of significant major symptoms.  There is some continued abdominal discomfort from cholecystitis but improved.  No evidence of chest discomfort anginal symptoms or congestive heart failure.  Echocardiogram pending.  EKG showing atrial fibrillation with ventricular pacing and no changes.  Stable  Review of Systems: Positive for: Abdominal discomfort Negative for: Vision change, hearing change, syncope, dizziness, nausea, vomiting,diarrhea, bloody stool,  cough, congestion, diaphoresis, urinary frequency, urinary pain,skin lesions, skin rashes Others previously listed  Objective: Telemetry: Atrial fibrillation with ventricular pacing Physical Exam: Blood pressure (!) 113/56, pulse 70, temperature (!) 97.4 F (36.3 C), temperature source Axillary, resp. rate 16, height 5\' 3"  (1.6 m), weight 81.6 kg, SpO2 94 %. Body mass index is 31.89 kg/m. General: Well developed, well nourished, in no acute distress. Head: Normocephalic, atraumatic, sclera non-icteric, no xanthomas, nares are without discharge. Neck: No apparent masses Lungs: Normal respirations with no wheezes, no rhonchi, no rales , no crackles   Heart: Regular rate and rhythm, normal S1 S2, left upper sternal border murmur, no rub, no gallop, PMI is normal size and placement, carotid upstroke normal without bruit, jugular venous pressure normal Abdomen: Soft,  tender, non-distended with normoactive bowel sounds. No hepatosplenomegaly. Abdominal aorta is normal size without bruit Extremities: No edema, no clubbing, no cyanosis, no ulcers,  Peripheral: 2+ radial, 2+ femoral, 2+ dorsal pedal pulses Neuro: Alert and oriented. Moves all extremities spontaneously. Psych:  Responds to questions appropriately with a normal affect.   Intake/Output  Summary (Last 24 hours) at 09/02/2019 0719 Last data filed at 09/02/2019 0539 Gross per 24 hour  Intake 1009.9 ml  Output 400 ml  Net 609.9 ml    Inpatient Medications:  . acetaminophen  500 mg Oral BID  . arformoterol  15 mcg Nebulization Q12H  . budesonide  0.5 mg Nebulization BID  . ipratropium-albuterol  3 mL Inhalation [id number]  . polyethylene glycol  17 g Oral Daily  . vitamin B-12  1,000 mcg Oral Daily   Infusions:  . heparin 950 Units/hr (09/01/19 1800)  . piperacillin-tazobactam (ZOSYN)  IV 3.375 g (09/02/19 0659)  . potassium chloride 10 mEq (09/02/19 0654)    Labs: Recent Labs    09/01/19 2234 09/02/19 0456  NA 140 140  K 3.5 3.5  CL 97* 98  CO2 29 31  GLUCOSE 167* 162*  BUN 19 24*  CREATININE 1.13* 1.37*  CALCIUM 8.4* 8.6*   Recent Labs    09/01/19 2234 09/02/19 0456  AST 171* 140*  ALT 150* 139*  ALKPHOS 154* 141*  BILITOT 1.8* 1.6*  PROT 6.9 6.4*  ALBUMIN 3.5 3.3*   Recent Labs    09/01/19 2234 09/02/19 0456  WBC 13.5* 13.7*  HGB 10.9* 11.0*  HCT 35.1* 36.0  MCV 94.6 96.0  PLT 167 154   No results for input(s): CKTOTAL, CKMB, TROPONINI in the last 72 hours. Invalid input(s): POCBNP No results for input(s): HGBA1C in the last 72 hours.   Weights: Filed Weights   08/31/19 1100  Weight: 81.6 kg     Radiology/Studies:  DG Abd 1 View  Result Date: 09/01/2019 CLINICAL DATA:  Abdominal pain, cholelithiasis EXAM: ABDOMEN - 1 VIEW COMPARISON:  08/31/2019 FINDINGS: Supine frontal view of the abdomen and pelvis demonstrates an unremarkable bowel gas pattern. The radiopaque gallstone seen on recent CT is not well visualized  on this exam, due to respiratory motion and positioning. There are no abdominal masses. Right hip arthroplasty is noted. Severe left convex scoliosis of the thoracolumbar spine. IMPRESSION: 1. Unremarkable bowel gas pattern. 2. Please refer to recent CT and ultrasound examinations describing cholelithiasis. Electronically  Signed   By: Randa Ngo M.D.   On: 09/01/2019 22:57   CT ABDOMEN PELVIS W CONTRAST  Result Date: 08/31/2019 CLINICAL DATA:  Onset right upper quadrant pain last night. EXAM: CT ABDOMEN AND PELVIS WITH CONTRAST TECHNIQUE: Multidetector CT imaging of the abdomen and pelvis was performed using the standard protocol following bolus administration of intravenous contrast. CONTRAST:  75 mL OMNIPAQUE IOHEXOL 300 MG/ML  SOLN COMPARISON:  None. FINDINGS: Lower chest: Lung bases clear. There is cardiomegaly. No pleural or pericardial effusion. Hepatobiliary: A 4 cm in diameter lamellated stone is seen in the gallbladder. The gallbladder is dilated and its wall appears mildly thickened. The liver and biliary tree are normal in appearance. Pancreas: Unremarkable. No pancreatic ductal dilatation or surrounding inflammatory changes. Spleen: Normal in size without focal abnormality. Adrenals/Urinary Tract: Adrenal glands are unremarkable. Kidneys are normal, without renal calculi, focal lesion, or hydronephrosis. Bladder is unremarkable. Stomach/Bowel: Stomach is within normal limits. Appendix appears normal. No evidence of bowel wall thickening, distention, or inflammatory changes. Vascular/Lymphatic: Aortic atherosclerosis. No enlarged abdominal or pelvic lymph nodes. Reproductive: Uterus and bilateral adnexa are unremarkable. Other: None. Musculoskeletal: There is severe convex left thoracolumbar scoliosis and multilevel degenerative change. The patient is status post right hip replacement. No acute or focal abnormality. IMPRESSION: 4 cm gallstone with gallbladder wall thickening and dilatation worrisome for acute cholecystitis. Recommend right upper quadrant ultrasound for further evaluation. Atherosclerosis. Scoliosis and multilevel degenerative disease. Electronically Signed   By: Inge Rise M.D.   On: 08/31/2019 12:56   US Abdomen Limited RUQ  Result Date: 08/31/2019 CLINICAL DATA:  Right upper quadrant  abdominal pain. EXAM: ULTRASOUND ABDOMEN LIMITED RIGHT UPPER QUADRANT COMPARISON:  None. FINDINGS: Gallbladder: 4 cm gallstone is noted. No significant gallbladder wall thickening or pericholecystic fluid is noted. Positive sonographic Murphy's sign is noted. Common bile duct: Diameter: 3 mm which is within normal limits. Liver: No focal lesion identified. Within normal limits in parenchymal echogenicity. Portal vein is patent on color Doppler imaging with normal direction of blood flow towards the liver. Other: None. IMPRESSION: Large solitary gallstone is noted. No gallbladder wall thickening or pericholecystic fluid is noted, but positive sonographic Murphy's sign is noted. If there is concern for cholecystitis, HIDA scan is recommended for further evaluation. Electronically Signed   By: Marijo Conception M.D.   On: 08/31/2019 14:19     Assessment and Recommendation  86 y.o. female with known chronic diastolic dysfunction heart failure chronic kidney disease atrial fibrillation with ventricular pacing having acute cholecystitis with no current evidence of anginal symptoms or congestive heart failure at lowest risk possible for cardiovascular complication with surgical intervention 1.  Continue intermittent heparin for further risk reduction in stroke with atrial fibrillation and discontinue heparin prior to surgical intervention with possible reinstatement 1 to 2 days after if necessary although may at that time reinstate Eliquis if able.  2.  Abstain from Eliquis at this time to reduce risk of surgical bleeding complications and reinstate when risk lowest possible after recovery from surgery 3.  No further intervention of atrial fibrillation heart rate control after previous history of AV node ablation and pacemaker placement for which patient will have baseline heart rate of 60 bpm 4.  Proceed to surgical intervention of cholecystitis after improvements of symptoms and treatment with antibiotics for  further risk reduction of abdominal pain and risk of perforation.  Patient is at lowest risk possible at this time for further surgical intervention with no current evidence of heart failure and/or anginal symptoms  Signed, Arnoldo Hooker M.D. FACC

## 2019-09-03 ENCOUNTER — Encounter
Admission: EM | Disposition: A | Discharge status: Home / Self Care | Payer: Self-pay | Source: Home / Self Care | Attending: Internal Medicine

## 2019-09-03 DIAGNOSIS — K810 Acute cholecystitis: Secondary | ICD-10-CM | POA: Diagnosis not present

## 2019-09-03 DIAGNOSIS — R1011 Right upper quadrant pain: Secondary | ICD-10-CM | POA: Diagnosis not present

## 2019-09-03 DIAGNOSIS — K8000 Calculus of gallbladder with acute cholecystitis without obstruction: Secondary | ICD-10-CM | POA: Diagnosis not present

## 2019-09-03 LAB — COMPREHENSIVE METABOLIC PANEL
ALT: 98 U/L — ABNORMAL HIGH (ref 0–44)
AST: 83 U/L — ABNORMAL HIGH (ref 15–41)
Albumin: 3 g/dL — ABNORMAL LOW (ref 3.5–5.0)
Alkaline Phosphatase: 130 U/L — ABNORMAL HIGH (ref 38–126)
Anion gap: 12 (ref 5–15)
BUN: 33 mg/dL — ABNORMAL HIGH (ref 8–23)
CO2: 30 mmol/L (ref 22–32)
Calcium: 8.6 mg/dL — ABNORMAL LOW (ref 8.9–10.3)
Chloride: 97 mmol/L — ABNORMAL LOW (ref 98–111)
Creatinine, Ser: 1.34 mg/dL — ABNORMAL HIGH (ref 0.44–1.00)
GFR calc Af Amer: 40 mL/min — ABNORMAL LOW (ref >60)
GFR calc non Af Amer: 35 mL/min — ABNORMAL LOW (ref >60)
Glucose, Bld: 125 mg/dL — ABNORMAL HIGH (ref 70–99)
Potassium: 3.4 mmol/L — ABNORMAL LOW (ref 3.5–5.1)
Sodium: 139 mmol/L (ref 135–145)
Total Bilirubin: 1.8 mg/dL — ABNORMAL HIGH (ref 0.3–1.2)
Total Protein: 6.5 g/dL (ref 6.5–8.1)

## 2019-09-03 LAB — CBC
HCT: 32.1 % — ABNORMAL LOW (ref 36.0–46.0)
Hemoglobin: 9.9 g/dL — ABNORMAL LOW (ref 12.0–15.0)
MCH: 29 pg (ref 26.0–34.0)
MCHC: 30.8 g/dL (ref 30.0–36.0)
MCV: 94.1 fL (ref 80.0–100.0)
Platelets: 169 10*3/uL (ref 150–400)
RBC: 3.41 MIL/uL — ABNORMAL LOW (ref 3.87–5.11)
RDW: 13.2 % (ref 11.5–15.5)
WBC: 9.6 10*3/uL (ref 4.0–10.5)
nRBC: 0 % (ref 0.0–0.2)

## 2019-09-03 LAB — HEPARIN LEVEL (UNFRACTIONATED): Heparin Unfractionated: 1.64 IU/mL — ABNORMAL HIGH (ref 0.30–0.70)

## 2019-09-03 LAB — APTT: aPTT: 71 seconds — ABNORMAL HIGH (ref 24–36)

## 2019-09-03 SURGERY — LAPAROSCOPIC CHOLECYSTECTOMY
Anesthesia: General

## 2019-09-03 MED ORDER — SENNOSIDES-DOCUSATE SODIUM 8.6-50 MG PO TABS
2.0000 | ORAL_TABLET | Freq: Two times a day (BID) | ORAL | Status: DC
  Administered 2019-09-03 – 2019-09-04 (×2): 2 via ORAL
  Filled 2019-09-03 (×3): qty 2

## 2019-09-03 MED ORDER — LACTATED RINGERS IV SOLN: INTRAVENOUS | Status: DC

## 2019-09-03 MED ORDER — IPRATROPIUM-ALBUTEROL 0.5-2.5 (3) MG/3ML IN SOLN
3.0000 mL | Freq: Three times a day (TID) | RESPIRATORY_TRACT | Status: DC
  Administered 2019-09-03 – 2019-09-05 (×5): 3 mL via RESPIRATORY_TRACT
  Filled 2019-09-03 (×4): qty 3

## 2019-09-03 NOTE — Progress Notes (Signed)
[facility] Progress Note  Patient: Diana Powell    EML:[id number]  DOA: 08/31/2019     Date of Service: the patient was seen and examined on 09/03/2019  Chief Complaint  Patient presents with   Abdominal Pain   Brief hospital course: Chronic A. fib, chronic systolic CHF, COPD, osteoporosis, PPM placement.  Patient presented with complaints of abdominal pain.  Found to have acute cholecystitis.  Underwent IR guided PERC drain placement on 09/02/2019. Currently further plan is continue IV antibiotics and monitor cultures.  Assessment and Plan: 1.  Acute cholecystitis. Cholelithiasis Elevated LFT. Started on empiric Zosyn. General surgery was consulted. Appreciate their assistance. IR was consulted for [facility] drain placement. LFTs are now trending down. Cultures so far not growing anything. Continue with IV Zosyn for now. Patient is on clear liquid diet. Eventually patient will require cholecystectomy.  2.  Acute kidney injury on chronic kidney disease stage IIIb. Patient was on Lasix which was reduced in the morning beginning. Currently we will discontinue the Lasix on 09/02/2019. BUN remains trending up. We will add gentle IV hydration for now. Monitor for volume overload in the patient who has systolic CHF  3.  Chronic systolic CHF. Paroxysmal A. fib. Echocardiogram performed shows EF of 45 to 50%. Cardiology was consulted for management of anticoagulation and recommended continuing heparin. We will discuss with general surgery regarding the plan for procedure and if there is none for now transition to Eliquis again from IV heparin.  4.  History of COPD. Continue inhalers and nebulizers. Currently not in any acute flareup.  5.  Pain control. Continue current regimen.  6.  Constipation. Increasing bowel regimen.  7.  Thoracolumbar scoliosis. Severe. Combining this with COPD places the patient at high risk for poor respiratory outcome down the road.  Diet:  Clear liquid diet DVT Prophylaxis: Therapeutic Anticoagulation with Heparin, Eliquis on hold   Advance goals of care discussion: Full code  Family Communication: family was present at bedside, at the time of interview.  The pt provided permission to discuss medical plan with the family. Opportunity was given to ask question and all questions were answered satisfactorily.   Disposition:  Pt is from home, admitted with abdominal pain, still has cholecystitis and poor PO intake, which precludes a safe discharge. Discharge to home, when tomorrow.  Subjective: still has bloating and abdominal pain, has some constipation  Physical Exam: General:  alert oriented to time, place, and person.  Appear in mild distress, affect appropriate Eyes: PERRL ENT: Oral Mucosa Clear, moist  Neck: no JVD,  Cardiovascular: S1 and S2 Present, no Murmur,  Respiratory: good respiratory effort, Bilateral Air entry equal and Decreased, no Crackles, no wheezes Abdomen: Bowel Sound present, Soft and mild tenderness,  Skin: no rash Extremities: no Pedal edema, no calf tenderness Neurologic: without any new focal findings  Gait not checked due to patient safety concerns  Vitals:   09/02/19 1802 09/02/19 2100 09/03/19 0613 09/03/19 0749  BP: 132/77  131/65   Pulse: 70  70   Resp: 18  20   Temp: 98.2 F (36.8 C)     TempSrc: Oral     SpO2: 96% 94% 93% 94%  Weight:      Height:        Intake/Output Summary (Last 24 hours) at 09/03/2019 0859 Last data filed at 09/03/2019 0625 Gross per 24 hour  Intake 435.43 ml  Output 590 ml  Net -154.57 ml   Filed Weights   08/31/19 1100  Weight: 81.6 kg    Data Reviewed: I have personally reviewed and interpreted daily labs, tele strips, imagings as discussed above. I reviewed all nursing notes, pharmacy notes, vitals, pertinent old records I have discussed plan of care as described above with RN and patient/family.  CBC: Recent Labs  Lab 08/31/19 1103  09/01/19 0538 09/01/19 2234 09/02/19 0456 09/03/19 0519  WBC 7.4 9.9 13.5* 13.7* 9.6  HGB 12.4 11.6* 10.9* 11.0* 9.9*  HCT 39.6 37.6 35.1* 36.0 32.1*  MCV 93.8 96.2 94.6 96.0 94.1  PLT 206 181 167 154 025   Basic Metabolic Panel: Recent Labs  Lab 08/31/19 1103 09/01/19 0538 09/01/19 2234 09/02/19 0456 09/03/19 0520  NA 142 142 140 140 139  K 3.6 3.6 3.5 3.5 3.4*  CL 98 100 97* 98 97*  CO2 31 29 29 31 30   GLUCOSE 131* 143* 167* 162* 125*  BUN 16 17 19  24* 33*  CREATININE 0.99 1.24* 1.13* 1.37* 1.34*  CALCIUM 9.1 8.5* 8.4* 8.6* 8.6*    Studies: ECHOCARDIOGRAM COMPLETE  Result Date: 09/02/2019    ECHOCARDIOGRAM REPORT   Patient Name:   Diana Powell Date of Exam: 09/02/2019 Medical Rec #:  [id number]     Height:       63.0 in Accession #:    [id number]    Weight:       180.0 lb Date of Birth:  11-Aug-1926     BSA:          1.85 m Patient Age:    86 years      BP:           113/56 mmHg Patient Gender: F             HR:           70 bpm. Exam Location:  [facility] Procedure: 2D Echo, Cardiac Doppler and Color Doppler Indications:     A-fib 427.30  History:         Patient has no prior history of Echocardiogram examinations.                  CHF; Arrythmias:Atrial Fibrillation.  Sonographer:     Alyse Low Roar Referring Phys:  [id number] Loletha Grayer Diagnosing Phys: Serafina Royals MD IMPRESSIONS  1. Septal dysfunction likely due to pacer stimulation. Left ventricular ejection fraction, by estimation, is 45 to 50%. The left ventricle has mildly decreased function. The left ventricle demonstrates regional wall motion abnormalities (see scoring diagram/findings for description). There is mildly increased left ventricular hypertrophy. Left ventricular diastolic parameters were normal.  2. Right ventricular systolic function is normal. The right ventricular size is normal. There is moderately elevated pulmonary artery systolic pressure.  3. Left atrial size was moderately dilated.  4. Right atrial size was  moderately dilated.  5. The mitral valve is normal in structure and function. Moderate mitral valve regurgitation.  6. Tricuspid valve regurgitation is moderate.  7. The aortic valve is grossly normal. Aortic valve regurgitation is mild. Mild aortic valve stenosis. FINDINGS  Left Ventricle: Septal dysfunction likely due to pacer stimulation. Left ventricular ejection fraction, by estimation, is 45 to 50%. The left ventricle has mildly decreased function. The left ventricle demonstrates regional wall motion abnormalities. There is mildly increased left ventricular hypertrophy. Left ventricular diastolic parameters were normal. Right Ventricle: The right ventricular size is normal. No increase in right ventricular wall thickness. Right ventricular systolic function is normal. There is moderately elevated pulmonary artery systolic pressure. The tricuspid regurgitant velocity is  3.27 m/s, and with an assumed right atrial pressure of 10 mmHg, the estimated right ventricular systolic pressure is 52.8 mmHg. Left Atrium: Left atrial size was moderately dilated. Right Atrium: Right atrial size was moderately dilated. Pericardium: There is no evidence of pericardial effusion. Mitral Valve: The mitral valve is normal in structure and function. Moderate mitral valve regurgitation. Tricuspid Valve: The tricuspid valve is normal in structure. Tricuspid valve regurgitation is moderate. Aortic Valve: The aortic valve is grossly normal. Aortic valve regurgitation is mild. Mild aortic stenosis is present. Aortic valve mean gradient measures 9.3 mmHg. Aortic valve peak gradient measures 18.8 mmHg. Aortic valve area, by VTI measures 0.93 cm. Pulmonic Valve: The pulmonic valve was normal in structure. Pulmonic valve regurgitation is not visualized. Aorta: The aortic root is normal in size and structure. IAS/Shunts: No atrial level shunt detected by color flow Doppler.  LEFT VENTRICLE PLAX 2D LVIDd:         4.40 cm  Diastology LVIDs:          3.19 cm  LV e' lateral:   10.20 cm/s LV PW:         1.09 cm  LV E/e' lateral: 15.1 LV IVS:        1.17 cm  LV e' medial:    6.64 cm/s LVOT diam:     1.60 cm  LV E/e' medial:  23.2 LV SV:         33.38 ml LV SV Index:   24.30 LVOT Area:     2.01 cm  RIGHT VENTRICLE RV Mid diam:    3.06 cm RV S prime:     11.90 cm/s LEFT ATRIUM             Index       RIGHT ATRIUM           Index LA diam:        4.70 cm 2.54 cm/m  RA Area:     21.10 cm LA Vol (A2C):   92.7 ml 50.14 ml/m RA Volume:   68.50 ml  37.05 ml/m LA Vol (A4C):   69.5 ml 37.59 ml/m LA Biplane Vol: 82.2 ml 44.46 ml/m  AORTIC VALVE                    PULMONIC VALVE AV Area (Vmax):    0.90 cm     PV Vmax:        0.86 m/s AV Area (Vmean):   0.85 cm     PV Peak grad:   2.9 mmHg AV Area (VTI):     0.93 cm     RVOT Peak grad: 3 mmHg AV Vmax:           217.00 cm/s AV Vmean:          136.333 cm/s AV VTI:            0.357 m AV Peak Grad:      18.8 mmHg AV Mean Grad:      9.3 mmHg LVOT Vmax:         96.90 cm/s LVOT Vmean:        57.400 cm/s LVOT VTI:          0.166 m LVOT/AV VTI ratio: 0.46  AORTA Ao Root diam: 2.60 cm MITRAL VALVE                TRICUSPID VALVE MV Area (PHT): 4.36 cm     TR Peak grad:   42.8  mmHg MV Decel Time: 174 msec     TR Vmax:        327.00 cm/s MV E velocity: 154.00 cm/s                             SHUNTS                             Systemic VTI:  0.17 m                             Systemic Diam: 1.60 cm Arnoldo Hooker MD Electronically signed by Arnoldo Hooker MD Signature Date/Time: 09/02/2019/4:22:46 PM    Final    CT PERC CHOLECYSTOSTOMY  Result Date: 09/03/2019 INDICATION: Acute cholecystitis due to huge obstructing gallstone and need for percutaneous cholecystostomy tube to decompress the gallbladder. The patient is not a candidate currently for surgical cholecystectomy. EXAM: CT PERCUTANEOUS CHOLECYSTOSTOMY MEDICATIONS: No additional medications. ANESTHESIA/SEDATION: Moderate (conscious) sedation was employed during this  procedure. A total of Versed 1.5 mg and Fentanyl 75 mcg was administered intravenously. Moderate Sedation Time: 21 minutes. The patient's level of consciousness and vital signs were monitored continuously by radiology nursing throughout the procedure under my direct supervision. FLUOROSCOPY TIME:  CT guidance. COMPLICATIONS: None immediate. PROCEDURE: Informed written consent was obtained from the patient after a thorough discussion of the procedural risks, benefits and alternatives. All questions were addressed. A [place] medical interpreter was utilized to obtain consent. Maximal Sterile Barrier Technique was utilized including caps, mask, sterile gowns, sterile gloves, sterile drape, hand hygiene and skin antiseptic. A timeout was performed prior to the initiation of the procedure. CT was performed in a supine position to localize the gallbladder. Under CT guidance, an 18 gauge trocar needle was advanced from a right abdominal approach into the gallbladder lumen. After return of bile, a guidewire was advanced into the gallbladder. The tract was dilated and a 10 French percutaneous drainage catheter advanced. Catheter position was confirmed by CT. A bile sample was withdrawn and sent for culture analysis. The drain was flushed and connected to a gravity drainage bag. It was secured at the skin with a Prolene retention suture and StatLock device. FINDINGS: The 82 French drainage catheter was advanced into the gallbladder with the distal portion of the catheter lying just beyond the huge gallstone within the gallbladder lumen. Initial dark bile sample was withdrawn and sent for culture analysis. After some. Of drainage, there was more bloody drainage from the catheter towards the end of the procedure. IMPRESSION: CT-guided percutaneous cholecystostomy tube placement with 10 French catheter advanced into the gallbladder. A bile sample was sent for culture analysis. Electronically Signed   By: Irish Lack M.D.    On: 09/03/2019 08:19    Scheduled Meds:  arformoterol  15 mcg Nebulization Q12H   budesonide  0.5 mg Nebulization BID   ipratropium-albuterol  3 mL Inhalation [id number]   polyethylene glycol  17 g Oral Daily   senna-docusate  2 tablet Oral BID   sodium chloride flush  5 mL Intracatheter Q8H   vitamin B-12  1,000 mcg Oral Daily   Continuous Infusions:  heparin 950 Units/hr (09/03/19 0136)   lactated ringers     piperacillin-tazobactam (ZOSYN)  IV 3.375 g (09/03/19 0533)   PRN Meds: bisacodyl, HYDROmorphone (DILAUDID) injection, ondansetron **OR** ondansetron (ZOFRAN) IV, oxyCODONE, simethicone  Time spent:  35 minutes  Author: Lynden Oxford, MD [facility] 09/03/2019 8:59 AM  To reach On-call, see care teams to locate the attending and reach out to them via [email or web address]. If 7PM-7AM, please contact night-coverage If you still have difficulty reaching the attending provider, please page the [facility] (Director on Call) for [facility] on amion for assistance.

## 2019-09-03 NOTE — Evaluation (Signed)
Physical Therapy Evaluation Patient Details Name: Avy Barlett MRN: [id number] DOB: Jul 15, 1927 Today's Date: 09/03/2019   History of Present Illness  86 y.o. female with improving LFTs and resolved leukocytosis attributable likely to acute cholecystitis s/p percutaneous cholecystostomy tube placement on 02/14. PMH of afib, COPD, OA, pacemaker, R THA, L wrist fx.    Clinical Impression  Pt alert, family (son) at bedside, son did not want to utilize language line for interpretor. Pt's son translated throughout session. The patient was sitting EOB at start of session, able to maintain balance without UE support. Family reported that she ambulates with SPC and furniture at home, RW or [facility] for longer distances in the community. Has an aide to assist daily for 6 hours.  The patient demonstrated sit <> stand with RW and CGA several times during session. The patient was able to ambulate to door and back with RW, CGA. spO2 >90%. The patient then progressed to ambulation in the hallway ~141ft no unsteadiness, buckling, or instability noted. Decreased gait velocity, stride length/step length noted, as well as pt fatigue and mild SOB. The patient reported L jaw/ear pain and abdominal pain at end of ambulation, RN in room to assess pt.  Overall the patient demonstrated deficits (see "PT Problem List") that impede the patient's functional abilities, safety, and mobility and would benefit from skilled PT intervention. Recommendation is outpatient PT (family preference and had previously set up PT evaluation) to maximize safety, mobility, and independence.      Follow Up Recommendations Supervision for mobility/OOB;Supervision - Intermittent;Outpatient PT    Equipment Recommendations  Other (comment)(has extensive DME at home)    Recommendations for Other Services       Precautions / Restrictions Precautions Precautions: Fall Precaution Comments: drain Restrictions Weight Bearing Restrictions: No       Mobility  Bed Mobility Overal bed mobility: Needs Assistance Bed Mobility: Supine to Sit;Sit to Supine       Sit to supine: Min assist   General bed mobility comments: Pt found sitting EOB with family. returned to supine with family member assistance for LE  Transfers Overall transfer level: Needs assistance Equipment used: Rolling walker (2 wheeled) Transfers: Sit to/from Stand Sit to Stand: Supervision            Ambulation/Gait Ambulation/Gait assistance: Min guard Gait Distance (Feet): 210 Feet Assistive device: Rolling walker (2 wheeled)       General Gait Details: no unsteadiness noted. decreased stride length/step length bilaterally  Stairs            Wheelchair Mobility    Modified Rankin (Stroke Patients Only)       Balance Overall balance assessment: Needs assistance Sitting-balance support: Feet supported Sitting balance-Leahy Scale: Fair       Standing balance-Leahy Scale: Fair                               Pertinent Vitals/Pain Pain Assessment: 0-10 Pain Score: 4  Pain Location: L jaw pain with opening of mouth, 9/10 abdominal pain intermittently Pain Descriptors / Indicators: Grimacing;Guarding Pain Intervention(s): Limited activity within patient's tolerance;Monitored during session;Repositioned;Other (comment)(RN in room to assess pt at end of session)    Home Living Family/patient expects to be discharged to:: Private residence Living Arrangements: Children Available Help at Discharge: Family;Personal care attendant;Available PRN/intermittently(6 hrs during the day, helps with morning ADLs) Type of Home: House Home Access: Stairs to enter   [employer] of Steps: 2,  2 inch steps Home Layout: Two level;Able to live on main level with bedroom/bathroom Home Equipment: Gilford Rile - 2 wheels;Cane - single point;Grab bars - tub/shower;Grab bars - toilet;Bedside commode;Wheelchair - manual      Prior Function  Level of Independence: Needs assistance   Gait / Transfers Assistance Needed: RW for community, [facility] and "furniture walking" for household mobility  ADL's / Homemaking Assistance Needed: assistance needed for dressing/bathing, has an Lexicographer        Extremity/Trunk Assessment   Upper Extremity Assessment Upper Extremity Assessment: Generalized weakness    Lower Extremity Assessment Lower Extremity Assessment: Generalized weakness    Cervical / Trunk Assessment Cervical / Trunk Assessment: Kyphotic  Communication   Communication: Prefers language other than English;Other (comment)(Russian)  Cognition Arousal/Alertness: Awake/alert Behavior During Therapy: WFL for tasks assessed/performed                                   General Comments: Son as Veterinary surgeon, did not want to use language line. Per son pt mental status improved today; oriented to self, place, time      General Comments      Exercises Other Exercises Other Exercises: extended time with family for education on PT role and POC, discharge recommendations Other Exercises: Pt sat EOB throughout PLOF questions without UE support, no LOB or difficulty noted   Assessment/Plan    PT Assessment Patient needs continued PT services  PT Problem List Decreased strength;Decreased mobility;Decreased activity tolerance;Decreased balance;Decreased knowledge of use of DME;Pain;Decreased knowledge of precautions       PT Treatment Interventions DME instruction;Therapeutic exercise;Gait training;Balance training;Stair training;Neuromuscular re-education;Functional mobility training;Therapeutic activities;Patient/family education    PT Goals (Current goals can be found in the Care Plan section)  Acute Rehab PT Goals Patient Stated Goal: Family wants patient to live a healthy, happy life PT Goal Formulation: With patient Time For Goal Achievement: 09/17/19 Potential to Achieve Goals:  Good    Frequency Min 2X/week   Barriers to discharge        Co-evaluation               AM-PAC PT "6 Clicks" Mobility  Outcome Measure Help needed turning from your back to your side while in a flat bed without using bedrails?: A Little Help needed moving from lying on your back to sitting on the side of a flat bed without using bedrails?: A Little Help needed moving to and from a bed to a chair (including a wheelchair)?: A Little Help needed standing up from a chair using your arms (e.g., wheelchair or bedside chair)?: A Little Help needed to walk in hospital room?: A Little Help needed climbing 3-5 steps with a railing? : A Lot 6 Click Score: 17    End of Session Equipment Utilized During Treatment: Gait belt Activity Tolerance: Patient tolerated treatment well Patient left: in bed;with family/visitor present;with nursing/sitter in room Nurse Communication: Mobility status PT Visit Diagnosis: Other abnormalities of gait and mobility (R26.89);Muscle weakness (generalized) (M62.81);Difficulty in walking, not elsewhere classified (R26.2)    Time: 5573-2202 PT Time Calculation (min) (ACUTE ONLY): 46 min   Charges:   PT Evaluation $PT Eval Low Complexity: 1 Low PT Treatments $Therapeutic Exercise: 23-37 mins        Lieutenant Diego PT, DPT 11:05 AM,09/03/19

## 2019-09-03 NOTE — Progress Notes (Signed)
[facility] SURGICAL PROGRESS NOTE (cpt [phone])  Hospital Day(s): 3.   Interval History: Patient seen and examined, no acute events or new complaints overnight. Patient's son at bedside helping with history. Overall pain is improved. Still complaining of bloating. No BM. Leukocytosis resolved. LFTs improving. T Bili still 1.8. 70 ccs from cholecystostomy tube.   Review of Systems:  Constitutional: denies fever, chills  HEENT: denies cough or congestion  Respiratory: denies any shortness of breath  Cardiovascular: denies chest pain or palpitations  Gastrointestinal: + abdominal pain (Improved), denied N/V, or diarrhea/and bowel function as per interval history Genitourinary: denies burning with urination or urinary frequency   Vital signs in last 24 hours: [min-max] current  Temp:  [98.2 F (36.8 C)-98.4 F (36.9 C)] 98.2 F (36.8 C) (02/14 1802) Pulse Rate:  [69-71] 70 (02/15 0613) Resp:  [15-21] 20 (02/15 0613) BP: (121-155)/(60-138) 131/65 (02/15 0613) SpO2:  [91 %-99 %] 94 % (02/15 0749)     Height: 5\' 3"  (160 cm) Weight: 81.6 kg BMI (Calculated): 31.89   Intake/Output last 2 shifts:  02/14 0701 - 02/15 0700 In: 435.4 [I.V.:206.6; IV Piggyback:228.8] Out: 590 [Urine:520; Drains:70]   Physical Exam:  Constitutional: alert, cooperative and no distress  HENT: normocephalic without obvious abnormality  Eyes: PERRL, EOM's grossly intact and symmetric  Respiratory: breathing non-labored at rest  Cardiovascular: regular rate and sinus rhythm  Gastrointestinal: soft, non-tender, and non-distended. No rebound or guarding. Cholecystostomy tube in the RUQ with bilious output Musculoskeletal: no edema or wounds, motor and sensation grossly intact, NT    Labs:  CBC Latest Ref Rng & Units 09/03/2019 09/02/2019 09/01/2019  WBC 4.0 - 10.5 K/uL 9.6 13.7(H) 13.5(H)  Hemoglobin 12.0 - 15.0 g/dL 09/03/2019) 11.0(L) 10.9(L)  Hematocrit 36.0 - 46.0 % 32.1(L) 36.0 35.1(L)  Platelets  150 - 400 K/uL 169 154 167   CMP Latest Ref Rng & Units 09/03/2019 09/02/2019 09/01/2019  Glucose 70 - 99 mg/dL 09/03/2019) 267(T) 245(Y)  BUN 8 - 23 mg/dL 099(I) 33(A) 19  Creatinine 0.44 - 1.00 mg/dL 25(K) 5.39(J) 6.73(A)  Sodium 135 - 145 mmol/L 139 140 140  Potassium 3.5 - 5.1 mmol/L 3.4(L) 3.5 3.5  Chloride 98 - 111 mmol/L 97(L) 98 97(L)  CO2 22 - 32 mmol/L 30 31 29   Calcium 8.9 - 10.3 mg/dL 1.93(X) ) 9.0(W)  Total Protein 6.5 - 8.1 g/dL 6.5 4.0(X) 6.9  Total Bilirubin 0.3 - 1.2 mg/dL 7.3(Z) 3.2(D) 9.2(E)  Alkaline Phos 38 - 126 U/L 130(H) 141(H) 154(H)  AST 15 - 41 U/L 83(H) 140(H) 171(H)  ALT 0 - 44 U/L 98(H) 139(H) 150(H)     Imaging studies: No new pertinent imaging studies   Assessment/Plan: (ICD-10's: K81.0) 86 y.o. female with improving LFTs and resolved leukocytosis attributable likely to acute cholecystitis s/p percutaneous cholecystostomy tube placement on 02/14   - If pain continues to improve okay to advance diet as tolerates   - Continue IV ABx  - Continue percutaneous cholecystostomy tube; monitor output   - pain control prn  - monitor LFTs; Bilirubin  - Mobilize as tolerates  - Further management per medicine team   All of the above findings and recommendations were discussed with the patient and her son, and the medical team, and all of patient's questions were answered to their expressed satisfaction.  -- 95, PA-C [facility] 09/03/2019, 10:24 AM [phone] M-F: 7am - 4pm

## 2019-09-03 NOTE — Consult Note (Signed)
ANTICOAGULATION CONSULT NOTE  Pharmacy Consult for Heparin Indication: atrial fibrillation  No Known Allergies  Patient Measurements: Height: 5\' 3"  (160 cm) Weight: 180 lb (81.6 kg) IBW/kg (Calculated) : 52.4 Heparin Dosing Weight: 70.3 kg  Vital Signs: BP: 131/65 (02/15 0613) Pulse Rate: 70 (02/15 0613)  Labs: Recent Labs    08/31/19 1103 08/31/19 1758 09/01/19 0538 09/01/19 0538 09/01/19 1320 09/01/19 2234 09/01/19 2234 09/02/19 0456 09/03/19 0519  HGB   < >  --  11.6*   < >  --  10.9*   < > 11.0* 9.9*  HCT   < >  --  37.6   < >  --  35.1*  --  36.0 32.1*  PLT   < >  --  181   < >  --  167  --  154 169  APTT   < > 33 97*   < > 100*  --   --  92* 71*  LABPROT  --  16.5*  --   --   --   --   --   --   --   INR  --  1.3*  --   --   --   --   --   --   --   HEPARINUNFRC   < > >3.60* >3.60*  --   --   --   --  2.30* 1.64*  CREATININE   < >  --  1.24*  --   --  1.13*  --  1.37*  --    < > = values in this interval not displayed.    Estimated Creatinine Clearance: 27.6 mL/min (A) (by C-G formula based on SCr of 1.37 mg/dL (H)).   Medical History: Past Medical History:  Diagnosis Date  . Atrial fibrillation ([place])   . Chronic systolic heart failure ([place])   . COPD (chronic obstructive pulmonary disease) ([place])   . Osteoarthritis, multiple sites   . Osteoporosis, post-menopausal   . Presence of permanent cardiac pacemaker     Medications:  Medications Prior to Admission  Medication Sig Dispense Refill Last Dose  . acetaminophen (TYLENOL) 500 MG tablet Take 500 mg by mouth in the morning and at bedtime. Also if needed   08/30/2019 at unknown  . apixaban (ELIQUIS) 5 MG TABS tablet Take 2 tablets (10 mg total) by mouth 2 (two) times daily for 7 days. Take apixaban 10 mg twice daily for 7 days followed by 5 mg p.o. twice daily after that. (Patient taking differently: Take 5 mg by mouth 2 (two) times daily. ) 60 tablet 1 08/31/2019 at 0830  . budesonide (PULMICORT) 0.5  MG/2ML nebulizer solution Take 0.5 mg by nebulization daily.  3 08/31/2019 at 0900  . Calcium Carbonate-Vitamin D (CALCIUM-VITAMIN D) 500-200 MG-UNIT per tablet Take 1 tablet by mouth daily.   08/30/2019 at Unknown time  . ferrous sulfate 325 (65 FE) MG tablet Take 325 mg by mouth daily with breakfast.   08/30/2019 at Unknown time  . formoterol (PERFOROMIST) 20 MCG/2ML nebulizer solution Inhale 20 mcg into the lungs every 12 (twelve) hours.   08/31/2019 at 0900  . furosemide (LASIX) 40 MG tablet Take 40 mg by mouth See admin instructions. Take 80 mg in the morning and 40 mg in the evening   08/31/2019 at 0830  . ipratropium-albuterol (DUONEB) 0.5-2.5 (3) MG/3ML SOLN Inhale 3 mLs into the lungs every 8 (eight) hours as needed.   prn at prn  . potassium chloride (K-DUR)  10 MEQ tablet Take 10 mEq by mouth daily.  3 08/30/2019 at unknown  . vitamin B-12 (CYANOCOBALAMIN) 1000 MCG tablet Take 1,000 mcg by mouth daily.   08/30/2019 at Unknown time   Scheduled:  . arformoterol  15 mcg Nebulization Q12H  . budesonide  0.5 mg Nebulization BID  . ipratropium-albuterol  3 mL Inhalation [id number]  . polyethylene glycol  17 g Oral Daily  . senna-docusate  1 tablet Oral BID  . sodium chloride flush  5 mL Intracatheter Q8H  . vitamin B-12  1,000 mcg Oral Daily   Infusions:  . heparin 950 Units/hr (09/03/19 0136)  . piperacillin-tazobactam (ZOSYN)  IV 3.375 g (09/03/19 0533)   PRN: bisacodyl, HYDROmorphone (DILAUDID) injection, ondansetron **OR** ondansetron (ZOFRAN) IV, oxyCODONE, simethicone Anti-infectives (From admission, onward)   Start     Dose/Rate Route Frequency Ordered Stop   08/31/19 2200  piperacillin-tazobactam (ZOSYN) IVPB 3.375 g     3.375 g 12.5 mL/hr over 240 Minutes Intravenous Every 8 hours 08/31/19 1605     08/31/19 1330  piperacillin-tazobactam (ZOSYN) IVPB 3.375 g     3.375 g 100 mL/hr over 30 Minutes Intravenous  Once 08/31/19 1315 08/31/19 1503      Assessment: Pharmacy consulted  to start heparin for afib. Pt was previously on apixaban. Will order baseline APTT, INR, and heparin level. Most likely use APTT for monitor if heparin level is falsely elevated.   2/13 0538 aPTT 97 HL > 3.60 2/13 1320 aPTT 100 2/14 0456 aPTT 92 HL 2.6  2/15 0519 aPTT 71 HL 1.64  Goal of Therapy:  APTT 66-102 seconds Heparin level 0.3-0.7 units/ml, once aPTT and Heparin level correlate.  Monitor platelets by anticoagulation protocol: Yes   Plan:  APTT therapeutic. Will continue current rate and will recheck aPTT, heparin level, and CBC with AM labs. Dose off aPTT until HL correlates.  Clovia Cuff, PharmD, BCPS 09/03/2019 7:03 AM

## 2019-09-04 ENCOUNTER — Ambulatory Visit: Payer: Medicare Other

## 2019-09-04 ENCOUNTER — Inpatient Hospital Stay: Payer: Medicare Other

## 2019-09-04 DIAGNOSIS — K8000 Calculus of gallbladder with acute cholecystitis without obstruction: Secondary | ICD-10-CM | POA: Diagnosis not present

## 2019-09-04 DIAGNOSIS — I5022 Chronic systolic (congestive) heart failure: Secondary | ICD-10-CM | POA: Diagnosis not present

## 2019-09-04 DIAGNOSIS — K810 Acute cholecystitis: Secondary | ICD-10-CM | POA: Diagnosis not present

## 2019-09-04 DIAGNOSIS — N189 Chronic kidney disease, unspecified: Secondary | ICD-10-CM | POA: Diagnosis not present

## 2019-09-04 DIAGNOSIS — N179 Acute kidney failure, unspecified: Secondary | ICD-10-CM | POA: Diagnosis not present

## 2019-09-04 LAB — COMPREHENSIVE METABOLIC PANEL
ALT: 74 U/L — ABNORMAL HIGH (ref 0–44)
AST: 54 U/L — ABNORMAL HIGH (ref 15–41)
Albumin: 2.8 g/dL — ABNORMAL LOW (ref 3.5–5.0)
Alkaline Phosphatase: 96 U/L (ref 38–126)
Anion gap: 8 (ref 5–15)
BUN: 29 mg/dL — ABNORMAL HIGH (ref 8–23)
CO2: 33 mmol/L — ABNORMAL HIGH (ref 22–32)
Calcium: 8.6 mg/dL — ABNORMAL LOW (ref 8.9–10.3)
Chloride: 101 mmol/L (ref 98–111)
Creatinine, Ser: 1.16 mg/dL — ABNORMAL HIGH (ref 0.44–1.00)
GFR calc Af Amer: 48 mL/min — ABNORMAL LOW (ref >60)
GFR calc non Af Amer: 41 mL/min — ABNORMAL LOW (ref >60)
Glucose, Bld: 98 mg/dL (ref 70–99)
Potassium: 3.4 mmol/L — ABNORMAL LOW (ref 3.5–5.1)
Sodium: 142 mmol/L (ref 135–145)
Total Bilirubin: 1.2 mg/dL (ref 0.3–1.2)
Total Protein: 5.9 g/dL — ABNORMAL LOW (ref 6.5–8.1)

## 2019-09-04 LAB — URINE CULTURE
Culture: NO GROWTH
Special Requests: NORMAL

## 2019-09-04 LAB — CBC
HCT: 30.7 % — ABNORMAL LOW (ref 36.0–46.0)
Hemoglobin: 9.7 g/dL — ABNORMAL LOW (ref 12.0–15.0)
MCH: 29.4 pg (ref 26.0–34.0)
MCHC: 31.6 g/dL (ref 30.0–36.0)
MCV: 93 fL (ref 80.0–100.0)
Platelets: 172 10*3/uL (ref 150–400)
RBC: 3.3 MIL/uL — ABNORMAL LOW (ref 3.87–5.11)
RDW: 13.1 % (ref 11.5–15.5)
WBC: 7.7 10*3/uL (ref 4.0–10.5)
nRBC: 0 % (ref 0.0–0.2)

## 2019-09-04 LAB — APTT
aPTT: 63 seconds — ABNORMAL HIGH (ref 24–36)
aPTT: 38 seconds — ABNORMAL HIGH (ref 24–36)

## 2019-09-04 LAB — HEPARIN LEVEL (UNFRACTIONATED): Heparin Unfractionated: 1.31 IU/mL — ABNORMAL HIGH (ref 0.30–0.70)

## 2019-09-04 MED ORDER — APIXABAN 5 MG PO TABS
5.0000 mg | ORAL_TABLET | Freq: Two times a day (BID) | ORAL | Status: DC
  Administered 2019-09-04 – 2019-09-05 (×3): 5 mg via ORAL
  Filled 2019-09-04 (×3): qty 1

## 2019-09-04 MED ORDER — FUROSEMIDE 40 MG PO TABS
80.0000 mg | ORAL_TABLET | Freq: Two times a day (BID) | ORAL | Status: DC
  Administered 2019-09-04 – 2019-09-05 (×2): 80 mg via ORAL
  Filled 2019-09-04 (×2): qty 2

## 2019-09-04 MED ORDER — ACETAMINOPHEN 500 MG PO TABS
1000.0000 mg | ORAL_TABLET | Freq: Three times a day (TID) | ORAL | Status: DC
  Administered 2019-09-04 – 2019-09-05 (×3): 1000 mg via ORAL
  Filled 2019-09-04 (×3): qty 2

## 2019-09-04 MED ORDER — DIPHENHYDRAMINE HCL 50 MG/ML IJ SOLN
25.0000 mg | INTRAMUSCULAR | Status: AC
  Administered 2019-09-04: 25 mg via INTRAVENOUS
  Filled 2019-09-04: qty 1

## 2019-09-04 NOTE — Progress Notes (Signed)
Nurse reports patient complaint of shortness of breath.  Bedside patient appears to be non labored breathing pattern.   Son is at bedside.  Son reports concerns regarding increased fluid and weight.  Discussed normal findings after abdominal infection/procedures/surgeries and recovery phase along with cough ^ deep breathing for pneumonia prevention.   Exam, very difficult to assess breath sounds secondary to environmental noise,  Questionable crackles left upper lobe posteriorly.   \Chest xray obtained and is without evidence of pulmonary or cardiac disease. Son also reported concerns regarding plan of care with regards of her eating.  General care and usual findings with recovery explained.  Deferred specific questions regarding care to primary care team.

## 2019-09-04 NOTE — Progress Notes (Signed)
[facility] Progress Note  Patient: Diana Powell    XKG:[id number]  DOA: 08/31/2019     Date of Service: the patient was seen and examined on 09/04/2019  Chief Complaint  Patient presents with  . Abdominal Pain   Brief hospital course: Chronic A. fib, chronic systolic CHF, COPD, osteoporosis, PPM placement.  Patient presented with complaints of abdominal pain.  Found to have acute cholecystitis.  Underwent IR guided PERC drain placement on 09/02/2019. Currently further plan is continue IV antibiotics and monitor cultures.  Assessment and Plan: 1.  Acute cholecystitis. Cholelithiasis Elevated LFT. Started on empiric Zosyn. General surgery was consulted. Appreciate their assistance. IR was consulted for [facility] drain placement. LFTs are now trending down. Cultures so far not growing anything. Continue with IV Zosyn for now. Patient is on clear liquid diet and advance as tolearted. Eventually patient will require cholecystectomy.  2.  Acute kidney injury on chronic kidney disease stage IIIb. Patient was on Lasix which was reduced in the morning beginning. discontinue the Lasix on 09/02/2019. S/p IV hydration  --d/c MIVF today --resume home lasix, per son's request and pt's kidney function also back to baseline  3.  Chronic systolic CHF. Paroxysmal A. fib. Echocardiogram performed shows EF of 45 to 50%. Cardiology was consulted for management of anticoagulation and recommended continuing heparin. We will discuss with general surgery regarding the plan for procedure and if there is none for now transition to Eliquis again from IV heparin. --d/c MIVF today --resume home lasix, per son's request and pt's kidney function also back to baseline --Standing weight  4.  History of COPD. Continue inhalers and nebulizers. Currently not in any acute flareup.  5.  Pain control. Continue current regimen.  6.  Constipation. Increasing bowel regimen.  7.  Thoracolumbar  scoliosis. Severe. Combining this with COPD places the patient at high risk for poor respiratory outcome down the road.  Diet: Clear liquid diet DVT Prophylaxis: Eliquis Advance goals of care discussion: Full code  Family Communication: Son updated at bedside  Disposition:  Discharge home tomorrow, after surgery clears.   Subjective:  Son at bedside translated and answered all questions for the pt.  Still has some abdominal pain.  Son reported chronic whole-body itching.  Son was concerned about pt's weight gain and being on IVF and not on home Lasix.  Pt is tolerating liquid diet.  No fever, N/V/D.   Physical Exam: Constitutional: NAD, AAOx3 HEENT: conjunctivae and lids normal, EOMI CV: RRR no M,R,G. Distal pulses +2.  No cyanosis.   RESP: crackles at posterior bases, normal respiratory effort, on RA GI: +BS, NTND, bili drain outputting small amount of brown fluids Extremities: No effusions, edema, or tenderness in BLE SKIN: warm, dry and intact, no rash Neuro: II - XII grossly intact.  Sensation intact Psych: Normal mood and affect.     Vitals:   09/03/19 2020 09/03/19 2100 09/04/19 0356 09/04/19 0958  BP: 140/66  (!) 170/74   Pulse: 70  70   Resp: 18  (!) 22   Temp: 98.2 F (36.8 C)  98 F (36.7 C)   TempSrc: Oral  Oral   SpO2: 93% 94% 93% 96%  Weight:      Height:        Intake/Output Summary (Last 24 hours) at 09/04/2019 1818 Last data filed at 09/04/2019 1429 Gross per 24 hour  Intake 802.59 ml  Output 500 ml  Net 302.59 ml   Filed Weights   08/31/19 1100  Weight:  81.6 kg    Data Reviewed: I have personally reviewed and interpreted daily labs, tele strips, imagings as discussed above. I reviewed all nursing notes, pharmacy notes, vitals, pertinent old records I have discussed plan of care as described above with RN and patient/family.  CBC: Recent Labs  Lab 09/01/19 0538 09/01/19 2234 09/02/19 0456 09/03/19 0519 09/04/19 0538  WBC 9.9 13.5*  13.7* 9.6 7.7  HGB 11.6* 10.9* 11.0* 9.9* 9.7*  HCT 37.6 35.1* 36.0 32.1* 30.7*  MCV 96.2 94.6 96.0 94.1 93.0  PLT 181 167 154 169 172   Basic Metabolic Panel: Recent Labs  Lab 09/01/19 0538 09/01/19 2234 09/02/19 0456 09/03/19 0520 09/04/19 0538  NA 142 140 140 139 142  K 3.6 3.5 3.5 3.4* 3.4*  CL 100 97* 98 97* 101  CO2 29 29 31 30  33*  GLUCOSE 143* 167* 162* 125* 98  BUN 17 19 24* 33* 29*  CREATININE 1.24* 1.13* 1.37* 1.34* 1.16*  CALCIUM 8.5* 8.4* 8.6* 8.6* 8.6*    Studies: DG Chest 1 View  Result Date: 09/04/2019 CLINICAL DATA:  Lung crackles EXAM: CHEST  1 VIEW COMPARISON:  04/30/2018 FINDINGS: Cardiomegaly with biventricular pacer. Interstitial coarsening at the bases that is similar to prior. There was airway thickening on a 2009 chest CT. There is no edema, consolidation, effusion, or pneumothorax. Prominent osteopenia and thoracolumbar scoliosis. IMPRESSION: Stable compared to 2019. no evidence of active disease. Electronically Signed   By: 2020 M.D.   On: 09/04/2019 04:58    Scheduled Meds: . acetaminophen  1,000 mg Oral TID  . apixaban  5 mg Oral BID  . arformoterol  15 mcg Nebulization Q12H  . budesonide  0.5 mg Nebulization BID  . furosemide  80 mg Oral BID  . ipratropium-albuterol  3 mL Inhalation TID  . polyethylene glycol  17 g Oral Daily  . senna-docusate  2 tablet Oral BID  . sodium chloride flush  5 mL Intracatheter Q8H  . vitamin B-12  1,000 mcg Oral Daily   Continuous Infusions: . piperacillin-tazobactam (ZOSYN)  IV 3.375 g (09/04/19 1429)   PRN Meds: bisacodyl, ondansetron **OR** ondansetron (ZOFRAN) IV, oxyCODONE, simethicone   09/06/19, MD [facility] 09/04/2019 6:18 PM  To reach On-call, see care teams to locate the attending and reach out to them via [email or web address]/06/2019. If 7PM-7AM, please contact night-coverage If you still have difficulty reaching the attending provider, please page the [facility] (Director on Call) for [facility] on amion for assistance.

## 2019-09-04 NOTE — Progress Notes (Signed)
Patient was short of breath after getting up from [facility]. Oxygen saturation was 92% on RA, so I put her on 2L and NP Jon Billings ordered a chest xray. Will continue to monitor.  Arturo Morton

## 2019-09-04 NOTE — Progress Notes (Signed)
[facility] Encounter Note  Patient: Diana Powell / Admit Date: 08/31/2019 / Date of Encounter: 09/04/2019, 8:27 AM   Subjective: Patient did well overnight with no evidence of significant major symptoms.  There is some continued abdominal discomfort from cholecystitis but improved after drainage tube placed.  No evidence of chest discomfort anginal symptoms or congestive heart failure.  Echocardiogram showing ejection fraction of 45 to 50% likely secondary to atrial fibrillation and/or ventricular pacing rather than true cardiomyopathy..  EKG showing atrial fibrillation with ventricular pacing and no changes.  Stable  Review of Systems: Positive for: Abdominal discomfort Negative for: Vision change, hearing change, syncope, dizziness, nausea, vomiting,diarrhea, bloody stool,  cough, congestion, diaphoresis, urinary frequency, urinary pain,skin lesions, skin rashes Others previously listed  Objective: Telemetry: Atrial fibrillation with ventricular pacing Physical Exam: Blood pressure (!) 170/74, pulse 70, temperature 98 F (36.7 C), temperature source Oral, resp. rate (!) 22, height 5\' 3"  (1.6 m), weight 81.6 kg, SpO2 93 %. Body mass index is 31.89 kg/m. General: Well developed, well nourished, in no acute distress. Head: Normocephalic, atraumatic, sclera non-icteric, no xanthomas, nares are without discharge. Neck: No apparent masses Lungs: Normal respirations with no wheezes, no rhonchi, no rales , no crackles   Heart: Regular rate and rhythm, normal S1 S2, left upper sternal border murmur, no rub, no gallop, PMI is normal size and placement, carotid upstroke normal without bruit, jugular venous pressure normal Abdomen: Soft,  tender, non-distended with normoactive bowel sounds. No hepatosplenomegaly. Abdominal aorta is normal size without bruit Extremities: No edema, no clubbing, no cyanosis, no ulcers,  Peripheral: 2+ radial, 2+ femoral, 2+ dorsal pedal  pulses Neuro: Alert and oriented. Moves all extremities spontaneously. Psych:  Responds to questions appropriately with a normal affect.   Intake/Output Summary (Last 24 hours) at 09/04/2019 0827 Last data filed at 09/04/2019 0606 Gross per 24 hour  Intake 830.15 ml  Output 460 ml  Net 370.15 ml    Inpatient Medications:  . arformoterol  15 mcg Nebulization Q12H  . budesonide  0.5 mg Nebulization BID  . ipratropium-albuterol  3 mL Inhalation TID  . polyethylene glycol  17 g Oral Daily  . senna-docusate  2 tablet Oral BID  . sodium chloride flush  5 mL Intracatheter Q8H  . vitamin B-12  1,000 mcg Oral Daily   Infusions:  . heparin 950 Units/hr (09/04/19 0351)  . lactated ringers Stopped (09/03/19 1603)  . piperacillin-tazobactam (ZOSYN)  IV 3.375 g (09/04/19 0604)    Labs: Recent Labs    09/03/19 0520 09/04/19 0538  NA 139 142  K 3.4* 3.4*  CL 97* 101  CO2 30 33*  GLUCOSE 125* 98  BUN 33* 29*  CREATININE 1.34* 1.16*  CALCIUM 8.6* 8.6*   Recent Labs    09/03/19 0520 09/04/19 0538  AST 83* 54*  ALT 98* 74*  ALKPHOS 130* 96  BILITOT 1.8* 1.2  PROT 6.5 5.9*  ALBUMIN 3.0* 2.8*   Recent Labs    09/03/19 0519 09/04/19 0538  WBC 9.6 7.7  HGB 9.9* 9.7*  HCT 32.1* 30.7*  MCV 94.1 93.0  PLT 169 172   No results for input(s): CKTOTAL, CKMB, TROPONINI in the last 72 hours. Invalid input(s): POCBNP No results for input(s): HGBA1C in the last 72 hours.   Weights: Filed Weights   08/31/19 1100  Weight: 81.6 kg     Radiology/Studies:  DG Chest 1 View  Result Date: 09/04/2019 CLINICAL DATA:  Lung crackles EXAM: CHEST  1  VIEW COMPARISON:  04/30/2018 FINDINGS: Cardiomegaly with biventricular pacer. Interstitial coarsening at the bases that is similar to prior. There was airway thickening on a 2009 chest CT. There is no edema, consolidation, effusion, or pneumothorax. Prominent osteopenia and thoracolumbar scoliosis. IMPRESSION: Stable compared to 2019. no  evidence of active disease. Electronically Signed   By: Marnee Spring M.D.   On: 09/04/2019 04:58   DG Abd 1 View  Result Date: 09/01/2019 CLINICAL DATA:  Abdominal pain, cholelithiasis EXAM: ABDOMEN - 1 VIEW COMPARISON:  08/31/2019 FINDINGS: Supine frontal view of the abdomen and pelvis demonstrates an unremarkable bowel gas pattern. The radiopaque gallstone seen on recent CT is not well visualized on this exam, due to respiratory motion and positioning. There are no abdominal masses. Right hip arthroplasty is noted. Severe left convex scoliosis of the thoracolumbar spine. IMPRESSION: 1. Unremarkable bowel gas pattern. 2. Please refer to recent CT and ultrasound examinations describing cholelithiasis. Electronically Signed   By: Sharlet Salina M.D.   On: 09/01/2019 22:57   CT ABDOMEN PELVIS W CONTRAST  Result Date: 08/31/2019 CLINICAL DATA:  Onset right upper quadrant pain last night. EXAM: CT ABDOMEN AND PELVIS WITH CONTRAST TECHNIQUE: Multidetector CT imaging of the abdomen and pelvis was performed using the standard protocol following bolus administration of intravenous contrast. CONTRAST:  75 mL OMNIPAQUE IOHEXOL 300 MG/ML  SOLN COMPARISON:  None. FINDINGS: Lower chest: Lung bases clear. There is cardiomegaly. No pleural or pericardial effusion. Hepatobiliary: A 4 cm in diameter lamellated stone is seen in the gallbladder. The gallbladder is dilated and its wall appears mildly thickened. The liver and biliary tree are normal in appearance. Pancreas: Unremarkable. No pancreatic ductal dilatation or surrounding inflammatory changes. Spleen: Normal in size without focal abnormality. Adrenals/Urinary Tract: Adrenal glands are unremarkable. Kidneys are normal, without renal calculi, focal lesion, or hydronephrosis. Bladder is unremarkable. Stomach/Bowel: Stomach is within normal limits. Appendix appears normal. No evidence of bowel wall thickening, distention, or inflammatory changes. Vascular/Lymphatic:  Aortic atherosclerosis. No enlarged abdominal or pelvic lymph nodes. Reproductive: Uterus and bilateral adnexa are unremarkable. Other: None. Musculoskeletal: There is severe convex left thoracolumbar scoliosis and multilevel degenerative change. The patient is status post right hip replacement. No acute or focal abnormality. IMPRESSION: 4 cm gallstone with gallbladder wall thickening and dilatation worrisome for acute cholecystitis. Recommend right upper quadrant ultrasound for further evaluation. Atherosclerosis. Scoliosis and multilevel degenerative disease. Electronically Signed   By: Drusilla Kanner M.D.   On: 08/31/2019 12:56   ECHOCARDIOGRAM COMPLETE  Result Date: 09/02/2019    ECHOCARDIOGRAM REPORT   Patient Name:   OLLIE DELANO Date of Exam: 09/02/2019 Medical Rec #:  [id number]     Height:       63.0 in Accession #:    [id number]    Weight:       180.0 lb Date of Birth:  06/07/1927     BSA:          1.85 m Patient Age:    86 years      BP:           113/56 mmHg Patient Gender: F             HR:           70 bpm. Exam Location:  [facility] Procedure: 2D Echo, Cardiac Doppler and Color Doppler Indications:     A-fib 427.30  History:         Patient has no prior history of Echocardiogram examinations.  CHF; Arrythmias:Atrial Fibrillation.  Sonographer:     Neysa Bonito Roar Referring Phys:  [id number] Alford Highland Diagnosing Phys: Arnoldo Hooker MD IMPRESSIONS  1. Septal dysfunction likely due to pacer stimulation. Left ventricular ejection fraction, by estimation, is 45 to 50%. The left ventricle has mildly decreased function. The left ventricle demonstrates regional wall motion abnormalities (see scoring diagram/findings for description). There is mildly increased left ventricular hypertrophy. Left ventricular diastolic parameters were normal.  2. Right ventricular systolic function is normal. The right ventricular size is normal. There is moderately elevated pulmonary artery systolic pressure.  3.  Left atrial size was moderately dilated.  4. Right atrial size was moderately dilated.  5. The mitral valve is normal in structure and function. Moderate mitral valve regurgitation.  6. Tricuspid valve regurgitation is moderate.  7. The aortic valve is grossly normal. Aortic valve regurgitation is mild. Mild aortic valve stenosis. FINDINGS  Left Ventricle: Septal dysfunction likely due to pacer stimulation. Left ventricular ejection fraction, by estimation, is 45 to 50%. The left ventricle has mildly decreased function. The left ventricle demonstrates regional wall motion abnormalities. There is mildly increased left ventricular hypertrophy. Left ventricular diastolic parameters were normal. Right Ventricle: The right ventricular size is normal. No increase in right ventricular wall thickness. Right ventricular systolic function is normal. There is moderately elevated pulmonary artery systolic pressure. The tricuspid regurgitant velocity is 3.27 m/s, and with an assumed right atrial pressure of 10 mmHg, the estimated right ventricular systolic pressure is 52.8 mmHg. Left Atrium: Left atrial size was moderately dilated. Right Atrium: Right atrial size was moderately dilated. Pericardium: There is no evidence of pericardial effusion. Mitral Valve: The mitral valve is normal in structure and function. Moderate mitral valve regurgitation. Tricuspid Valve: The tricuspid valve is normal in structure. Tricuspid valve regurgitation is moderate. Aortic Valve: The aortic valve is grossly normal. Aortic valve regurgitation is mild. Mild aortic stenosis is present. Aortic valve mean gradient measures 9.3 mmHg. Aortic valve peak gradient measures 18.8 mmHg. Aortic valve area, by VTI measures 0.93 cm. Pulmonic Valve: The pulmonic valve was normal in structure. Pulmonic valve regurgitation is not visualized. Aorta: The aortic root is normal in size and structure. IAS/Shunts: No atrial level shunt detected by color flow Doppler.   LEFT VENTRICLE PLAX 2D LVIDd:         4.40 cm  Diastology LVIDs:         3.19 cm  LV e' lateral:   10.20 cm/s LV PW:         1.09 cm  LV E/e' lateral: 15.1 LV IVS:        1.17 cm  LV e' medial:    6.64 cm/s LVOT diam:     1.60 cm  LV E/e' medial:  23.2 LV SV:         33.38 ml LV SV Index:   24.30 LVOT Area:     2.01 cm  RIGHT VENTRICLE RV Mid diam:    3.06 cm RV S prime:     11.90 cm/s LEFT ATRIUM             Index       RIGHT ATRIUM           Index LA diam:        4.70 cm 2.54 cm/m  RA Area:     21.10 cm LA Vol (A2C):   92.7 ml 50.14 ml/m RA Volume:   68.50 ml  37.05 ml/m LA Vol (A4C):   69.5  ml 37.59 ml/m LA Biplane Vol: 82.2 ml 44.46 ml/m  AORTIC VALVE                    PULMONIC VALVE AV Area (Vmax):    0.90 cm     PV Vmax:        0.86 m/s AV Area (Vmean):   0.85 cm     PV Peak grad:   2.9 mmHg AV Area (VTI):     0.93 cm     RVOT Peak grad: 3 mmHg AV Vmax:           217.00 cm/s AV Vmean:          136.333 cm/s AV VTI:            0.357 m AV Peak Grad:      18.8 mmHg AV Mean Grad:      9.3 mmHg LVOT Vmax:         96.90 cm/s LVOT Vmean:        57.400 cm/s LVOT VTI:          0.166 m LVOT/AV VTI ratio: 0.46  AORTA Ao Root diam: 2.60 cm MITRAL VALVE                TRICUSPID VALVE MV Area (PHT): 4.36 cm     TR Peak grad:   42.8 mmHg MV Decel Time: 174 msec     TR Vmax:        327.00 cm/s MV E velocity: 154.00 cm/s                             SHUNTS                             Systemic VTI:  0.17 m                             Systemic Diam: 1.60 cm Arnoldo Hooker MD Electronically signed by Arnoldo Hooker MD Signature Date/Time: 09/02/2019/4:22:46 PM    Final    CT PERC CHOLECYSTOSTOMY  Result Date: 09/03/2019 INDICATION: Acute cholecystitis due to huge obstructing gallstone and need for percutaneous cholecystostomy tube to decompress the gallbladder. The patient is not a candidate currently for surgical cholecystectomy. EXAM: CT PERCUTANEOUS CHOLECYSTOSTOMY MEDICATIONS: No additional medications.  ANESTHESIA/SEDATION: Moderate (conscious) sedation was employed during this procedure. A total of Versed 1.5 mg and Fentanyl 75 mcg was administered intravenously. Moderate Sedation Time: 21 minutes. The patient's level of consciousness and vital signs were monitored continuously by radiology nursing throughout the procedure under my direct supervision. FLUOROSCOPY TIME:  CT guidance. COMPLICATIONS: None immediate. PROCEDURE: Informed written consent was obtained from the patient after a thorough discussion of the procedural risks, benefits and alternatives. All questions were addressed. A [place] medical interpreter was utilized to obtain consent. Maximal Sterile Barrier Technique was utilized including caps, mask, sterile gowns, sterile gloves, sterile drape, hand hygiene and skin antiseptic. A timeout was performed prior to the initiation of the procedure. CT was performed in a supine position to localize the gallbladder. Under CT guidance, an 18 gauge trocar needle was advanced from a right abdominal approach into the gallbladder lumen. After return of bile, a guidewire was advanced into the gallbladder. The tract was dilated and a 10 French percutaneous drainage catheter advanced. Catheter position was confirmed by CT. A  bile sample was withdrawn and sent for culture analysis. The drain was flushed and connected to a gravity drainage bag. It was secured at the skin with a Prolene retention suture and StatLock device. FINDINGS: The 29 French drainage catheter was advanced into the gallbladder with the distal portion of the catheter lying just beyond the huge gallstone within the gallbladder lumen. Initial dark bile sample was withdrawn and sent for culture analysis. After some. Of drainage, there was more bloody drainage from the catheter towards the end of the procedure. IMPRESSION: CT-guided percutaneous cholecystostomy tube placement with 10 French catheter advanced into the gallbladder. A bile sample was  sent for culture analysis. Electronically Signed   By: Irish Lack M.D.   On: 09/03/2019 08:19   US Abdomen Limited RUQ  Result Date: 08/31/2019 CLINICAL DATA:  Right upper quadrant abdominal pain. EXAM: ULTRASOUND ABDOMEN LIMITED RIGHT UPPER QUADRANT COMPARISON:  None. FINDINGS: Gallbladder: 4 cm gallstone is noted. No significant gallbladder wall thickening or pericholecystic fluid is noted. Positive sonographic Murphy's sign is noted. Common bile duct: Diameter: 3 mm which is within normal limits. Liver: No focal lesion identified. Within normal limits in parenchymal echogenicity. Portal vein is patent on color Doppler imaging with normal direction of blood flow towards the liver. Other: None. IMPRESSION: Large solitary gallstone is noted. No gallbladder wall thickening or pericholecystic fluid is noted, but positive sonographic Murphy's sign is noted. If there is concern for cholecystitis, HIDA scan is recommended for further evaluation. Electronically Signed   By: Lupita Raider M.D.   On: 08/31/2019 14:19     Assessment and Recommendation  86 y.o. female with known chronic diastolic dysfunction heart failure chronic kidney disease atrial fibrillation with ventricular pacing having acute cholecystitis with no current evidence of anginal symptoms or congestive heart failure at lowest risk possible for cardiovascular complication with further surgical intervention if needed after drainage tube placed 1.  Continue intermittent heparin for further risk reduction in stroke with atrial fibrillation and discontinue heparin prior to surgical intervention if needed with possible reinstatement 1 to 2 days after if necessary although may at that time reinstate Eliquis if bleeding or bruising concerns are minimized.  2.  Continue rehabilitation and physical activity after drainage tube without restriction from the cardiac standpoint with no current evidence of congestive heart failure or angina recent chest  x-ray showing no evidence of pulmonary edema 3.  No further intervention of atrial fibrillation heart rate control after previous history of AV node ablation and pacemaker placement for which patient will have baseline heart rate of 60 bpm 4.  Call if further questions Signed, Arnoldo Hooker M.D. FACC

## 2019-09-04 NOTE — Progress Notes (Addendum)
[facility] SURGICAL PROGRESS NOTE (cpt [phone])  Hospital Day(s): 4.   Interval History: Patient seen and examined, overnight had concerns over breathing. Son feels she is getting fluid overloaded. CXR overnight was reassuring. Patient's son at bedside helping with history. Abdominal pain is solely at drain site. No fever, chills, nausea, or emesis.. Leukocytosis remains within normal limits. Hyperbilirubinemia resolved and remaining LFTs continue to improve. Renal function appears to be more baseline this morning. She was started on CLD and tolerated well.   Review of Systems:  Constitutional: denies fever, chills  HEENT: denies cough or congestion  Respiratory: denies any shortness of breath  Cardiovascular: denies chest pain or palpitations  Gastrointestinal: + abdominal pain (Improved - at drain site), denied N/V, or diarrhea/and bowel function as per interval history Genitourinary: denies burning with urination or urinary frequency   Vital signs in last 24 hours: [min-max] current  Temp:  [98 F (36.7 C)-98.2 F (36.8 C)] 98 F (36.7 C) (02/16 0356) Pulse Rate:  [70] 70 (02/16 0356) Resp:  [18-22] 22 (02/16 0356) BP: (140-170)/(66-74) 170/74 (02/16 0356) SpO2:  [93 %-94 %] 93 % (02/16 0356)     Height: 5\' 3"  (160 cm) Weight: 81.6 kg BMI (Calculated): 31.89   Intake/Output last 2 shifts:  02/15 0701 - 02/16 0700 In: 830.2 [I.V.:687.4; IV Piggyback:142.8] Out: 460 [Drains:460]   Physical Exam:  Constitutional: alert, cooperative and no distress  HENT: normocephalic without obvious abnormality  Eyes: PERRL, EOM's grossly intact and symmetric  Respiratory: breathing non-labored at rest  Cardiovascular: regular rate and sinus rhythm  Gastrointestinal: soft, tenderness primarily at drain site, and non-distended. No rebound or guarding. Cholecystostomy tube in the RUQ with bilious output Musculoskeletal: no edema or wounds, motor and sensation grossly intact, NT     Labs:  CBC Latest Ref Rng & Units 09/04/2019 09/03/2019 09/02/2019  WBC 4.0 - 10.5 K/uL 7.7 9.6 13.7(H)  Hemoglobin 12.0 - 15.0 g/dL 09/04/2019) 6.3(O) 11.0(L)  Hematocrit 36.0 - 46.0 % 30.7(L) 32.1(L) 36.0  Platelets 150 - 400 K/uL 172 169 154   CMP Latest Ref Rng & Units 09/04/2019 09/03/2019 09/02/2019  Glucose 70 - 99 mg/dL 98 09/04/2019) 433(I)  BUN 8 - 23 mg/dL 951(O) 84(Z) 66(A)  Creatinine 0.44 - 1.00 mg/dL 63(K) 1.60(F) 0.93(A)  Sodium 135 - 145 mmol/L 142 139 140  Potassium 3.5 - 5.1 mmol/L 3.4(L) 3.4(L) 3.5  Chloride 98 - 111 mmol/L 101 97(L) 98  CO2 22 - 32 mmol/L 33(H) 30 31  Calcium 8.9 - 10.3 mg/dL 3.55(D) 3.2(K) 0.2(R)  Total Protein 6.5 - 8.1 g/dL 5.9(L) 6.5 6.4(L)  Total Bilirubin 0.3 - 1.2 mg/dL 1.2 4.2(H) 0.6(C)  Alkaline Phos 38 - 126 U/L 96 130(H) 141(H)  AST 15 - 41 U/L 54(H) 83(H) 140(H)  ALT 0 - 44 U/L 74(H) 98(H) 139(H)     Imaging studies: No new pertinent imaging studies   Assessment/Plan: (ICD-10's: K81.0) 86 y.o. female with improving LFTs and resolved leukocytosis attributable likely to acute cholecystitis s/p percutaneous cholecystostomy tube placement on 02/14   - Okay to advance diet as tolerates; Full liquids for breakfast; soft for dinner   - Continue IV ABx; transition to PO at discharge  - Continue percutaneous cholecystostomy tube; monitor output   - pain control prn  - Mobilize as tolerates  - Further management per medicine team; we will follow   - Discharge Planning: If tolerates advancement of diet today, should be okay from surgical standpoint for discharge home in next  24 hours - defer to medicine serivce  All of the above findings and recommendations were discussed with the patient and her son, and the medical team, and all of patient's questions were answered to their expressed satisfaction.  -- Edison Simon, PA-C [facility] 09/04/2019, 7:12 AM [phone] M-F: 7am - 4pm Pt seen and examined. I agree w above,  improving after chole tube, no surgical indication at this time.

## 2019-09-04 NOTE — Care Management Important Message (Signed)
Important Message  Patient Details  Name: Diana Powell MRN: [id number] Date of Birth: 12-01-1926   Medicare Important Message Given:  Yes  Initial Medicare IM given by Patient Access Associate on 09/04/2019 at 10:09am.     Johnell Comings 09/04/2019, 10:14 AM

## 2019-09-04 NOTE — Consult Note (Signed)
ANTICOAGULATION CONSULT NOTE  Pharmacy Consult for Heparin Indication: atrial fibrillation  No Known Allergies  Patient Measurements: Height: 5\' 3"  (160 cm) Weight: 180 lb (81.6 kg) IBW/kg (Calculated) : 52.4 Heparin Dosing Weight: 70.3 kg  Vital Signs: Temp: 98 F (36.7 C) (02/16 0356) Temp Source: Oral (02/16 0356) BP: 170/74 (02/16 0356) Pulse Rate: 70 (02/16 0356)  Labs: Recent Labs    09/02/19 0456 09/02/19 0456 09/03/19 0519 09/03/19 0520 09/04/19 0538  HGB 11.0*   < > 9.9*  --  9.7*  HCT 36.0  --  32.1*  --  30.7*  PLT 154  --  169  --  172  APTT 92*  --  71*  --  63*  HEPARINUNFRC 2.30*  --  1.64*  --   --   CREATININE 1.37*  --   --  1.34* 1.16*   < > = values in this interval not displayed.    Estimated Creatinine Clearance: 32.6 mL/min (A) (by C-G formula based on SCr of 1.16 mg/dL (H)).   Medical History: Past Medical History:  Diagnosis Date  . Atrial fibrillation (HCC)   . Chronic systolic heart failure (HCC)   . COPD (chronic obstructive pulmonary disease) (HCC)   . Osteoarthritis, multiple sites   . Osteoporosis, post-menopausal   . Presence of permanent cardiac pacemaker     Medications:  Medications Prior to Admission  Medication Sig Dispense Refill Last Dose  . acetaminophen (TYLENOL) 500 MG tablet Take 500 mg by mouth in the morning and at bedtime. Also if needed   08/30/2019 at unknown  . apixaban (ELIQUIS) 5 MG TABS tablet Take 2 tablets (10 mg total) by mouth 2 (two) times daily for 7 days. Take apixaban 10 mg twice daily for 7 days followed by 5 mg p.o. twice daily after that. (Patient taking differently: Take 5 mg by mouth 2 (two) times daily. ) 60 tablet 1 08/31/2019 at 0830  . budesonide (PULMICORT) 0.5 MG/2ML nebulizer solution Take 0.5 mg by nebulization daily.  3 08/31/2019 at 0900  . Calcium Carbonate-Vitamin D (CALCIUM-VITAMIN D) 500-200 MG-UNIT per tablet Take 1 tablet by mouth daily.   08/30/2019 at Unknown time  . ferrous  sulfate 325 (65 FE) MG tablet Take 325 mg by mouth daily with breakfast.   08/30/2019 at Unknown time  . formoterol (PERFOROMIST) 20 MCG/2ML nebulizer solution Inhale 20 mcg into the lungs every 12 (twelve) hours.   08/31/2019 at 0900  . furosemide (LASIX) 40 MG tablet Take 40 mg by mouth See admin instructions. Take 80 mg in the morning and 40 mg in the evening   08/31/2019 at 0830  . ipratropium-albuterol (DUONEB) 0.5-2.5 (3) MG/3ML SOLN Inhale 3 mLs into the lungs every 8 (eight) hours as needed.   prn at prn  . potassium chloride (K-DUR) 10 MEQ tablet Take 10 mEq by mouth daily.  3 08/30/2019 at unknown  . vitamin B-12 (CYANOCOBALAMIN) 1000 MCG tablet Take 1,000 mcg by mouth daily.   08/30/2019 at Unknown time   Scheduled:  . arformoterol  15 mcg Nebulization Q12H  . budesonide  0.5 mg Nebulization BID  . ipratropium-albuterol  3 mL Inhalation TID  . polyethylene glycol  17 g Oral Daily  . senna-docusate  2 tablet Oral BID  . sodium chloride flush  5 mL Intracatheter Q8H  . vitamin B-12  1,000 mcg Oral Daily   Infusions:  . heparin 950 Units/hr (09/04/19 0351)  . lactated ringers Stopped (09/03/19 1603)  . piperacillin-tazobactam (ZOSYN)  IV 3.375 g (09/04/19 0604)   PRN: bisacodyl, ondansetron **OR** ondansetron (ZOFRAN) IV, oxyCODONE, simethicone Anti-infectives (From admission, onward)   Start     Dose/Rate Route Frequency Ordered Stop   08/31/19 2200  piperacillin-tazobactam (ZOSYN) IVPB 3.375 g     3.375 g 12.5 mL/hr over 240 Minutes Intravenous Every 8 hours 08/31/19 1605     08/31/19 1330  piperacillin-tazobactam (ZOSYN) IVPB 3.375 g     3.375 g 100 mL/hr over 30 Minutes Intravenous  Once 08/31/19 1315 08/31/19 1503      Assessment: Pharmacy consulted to start heparin for afib. Pt was previously on apixaban. Will order baseline APTT, INR, and heparin level. Most likely use APTT for monitor if heparin level is falsely elevated.   2/13 0538 aPTT 97 HL > 3.60 2/13 1320 aPTT  100 2/14 0456 aPTT 92 HL 2.6  2/15 0519 aPTT 71 HL 1.64 2/16 0538 aPTT 63 aPTT pending  Goal of Therapy:  APTT 66-102 seconds Heparin level 0.3-0.7 units/ml, once aPTT and Heparin level correlate.  Monitor platelets by anticoagulation protocol: Yes   Plan:  APTT therapeutic. Will continue current rate and will recheck aPTT, heparin level, and CBC with AM labs. Dose off aPTT until HL correlates.  APTT subtherapeutic.  CBC stable, PLTs still low.  Will increase heparin rate to 1100 units/hr and recheck aPTT in ~ 8 hours  Hart Robinsons, PharmD 09/04/2019 7:03 AM

## 2019-09-05 DIAGNOSIS — K8000 Calculus of gallbladder with acute cholecystitis without obstruction: Secondary | ICD-10-CM | POA: Diagnosis not present

## 2019-09-05 DIAGNOSIS — K810 Acute cholecystitis: Secondary | ICD-10-CM | POA: Diagnosis not present

## 2019-09-05 LAB — CBC
HCT: 30.6 % — ABNORMAL LOW (ref 36.0–46.0)
Hemoglobin: 9.6 g/dL — ABNORMAL LOW (ref 12.0–15.0)
MCH: 29.4 pg (ref 26.0–34.0)
MCHC: 31.4 g/dL (ref 30.0–36.0)
MCV: 93.6 fL (ref 80.0–100.0)
Platelets: 192 10*3/uL (ref 150–400)
RBC: 3.27 MIL/uL — ABNORMAL LOW (ref 3.87–5.11)
RDW: 13.1 % (ref 11.5–15.5)
WBC: 4.9 10*3/uL (ref 4.0–10.5)
nRBC: 0 % (ref 0.0–0.2)

## 2019-09-05 LAB — BASIC METABOLIC PANEL
Anion gap: 11 (ref 5–15)
BUN: 26 mg/dL — ABNORMAL HIGH (ref 8–23)
CO2: 32 mmol/L (ref 22–32)
Calcium: 8.6 mg/dL — ABNORMAL LOW (ref 8.9–10.3)
Chloride: 100 mmol/L (ref 98–111)
Creatinine, Ser: 1.07 mg/dL — ABNORMAL HIGH (ref 0.44–1.00)
GFR calc Af Amer: 53 mL/min — ABNORMAL LOW (ref >60)
GFR calc non Af Amer: 46 mL/min — ABNORMAL LOW (ref >60)
Glucose, Bld: 93 mg/dL (ref 70–99)
Potassium: 2.9 mmol/L — ABNORMAL LOW (ref 3.5–5.1)
Sodium: 143 mmol/L (ref 135–145)

## 2019-09-05 LAB — MAGNESIUM: Magnesium: 2.3 mg/dL (ref 1.7–2.4)

## 2019-09-05 MED ORDER — APIXABAN 5 MG PO TABS: 5.0000 mg | ORAL_TABLET | Freq: Two times a day (BID) | ORAL | Status: DC

## 2019-09-05 MED ORDER — POTASSIUM CHLORIDE CRYS ER 20 MEQ PO TBCR
40.0000 meq | EXTENDED_RELEASE_TABLET | Freq: Once | ORAL | Status: AC
  Administered 2019-09-05: 40 meq via ORAL
  Filled 2019-09-05: qty 2

## 2019-09-05 MED ORDER — OXYCODONE HCL 5 MG PO TABS: 5.0000 mg | ORAL_TABLET | Freq: Two times a day (BID) | ORAL | 0 refills | Status: AC | PRN

## 2019-09-05 MED ORDER — METRONIDAZOLE 500 MG PO TABS
500.0000 mg | ORAL_TABLET | Freq: Three times a day (TID) | ORAL | Status: DC
  Administered 2019-09-05: 500 mg via ORAL
  Filled 2019-09-05 (×3): qty 1

## 2019-09-05 MED ORDER — ACETAMINOPHEN 500 MG PO TABS: 1000.0000 mg | ORAL_TABLET | Freq: Three times a day (TID) | ORAL | 0 refills | Status: AC

## 2019-09-05 MED ORDER — METRONIDAZOLE 500 MG PO TABS: 500.0000 mg | ORAL_TABLET | Freq: Three times a day (TID) | ORAL | 0 refills | Status: AC

## 2019-09-05 MED ORDER — CIPROFLOXACIN HCL 250 MG PO TABS: 250.0000 mg | ORAL_TABLET | Freq: Two times a day (BID) | ORAL | 0 refills | Status: AC

## 2019-09-05 MED ORDER — CIPROFLOXACIN HCL 500 MG PO TABS
250.0000 mg | ORAL_TABLET | Freq: Two times a day (BID) | ORAL | Status: DC
  Administered 2019-09-05: 250 mg via ORAL
  Filled 2019-09-05: qty 1

## 2019-09-05 NOTE — TOC Transition Note (Signed)
Transition of Care [facility]) - CM/SW Discharge Note   Patient Details  Name: Diana Powell MRN: [id number] Date of Birth: 1927/01/29  [facility] CM/SW Contact:  Liliana Cline, LCSW Phone Number: 09/05/2019, 12:28 PM   Clinical Narrative:   CSW called son, Trinna Post. He was agreeable to PT recommendation for Outpatient Therapy. Preference was for [facility]. Son reported patient received services there in the past. Order was signed and faxed to the Clinic. Patient has orders to discharge home today. No further concerns. CSW signing off.    Final next level of care: OP Rehab Barriers to Discharge: Barriers Resolved   Patient Goals and CMS Choice Patient states their goals for this hospitalization and ongoing recovery are:: patient not fully oriented   Choice offered to / list presented to : Adult Children  Discharge Placement                       Discharge Plan and Services     Post Acute Care Choice: (Outpatient rehab)                               Social Determinants of Health (SDOH) Interventions     Readmission Risk Interventions No flowsheet data found.

## 2019-09-05 NOTE — Discharge Summary (Signed)
Physician Discharge Summary   Diana Powell  female DOB: 1927-04-12  HAL:[id number]  PCP: Lollie Marrow, MD  Admit date: 08/31/2019 Discharge date: 09/05/2019  Admitted From: home Disposition:  home Home Health: Yes CODE STATUS: Full code  Discharge Instructions    Diet - low sodium heart healthy   Complete by: As directed    Discharge instructions   Complete by: As directed    Flush the bili tube 3 times per day as instructed by nursing, and monitor output.  Please finish 5 more days of antibiotics with Cipro and Flagyl.  Please follow up with General Surgery.  Dr. Darlin Priestly - -   Increase activity slowly   Complete by: As directed        Hospital Course:  For full details, please see H&P, progress notes, consult notes and ancillary notes.  Briefly,  Diana Powell is a 86 y.o. Caucasian female with hx of Chronic A. fib, chronic systolic CHF, COPD, osteoporosis, PPM placement.  Patient presented with complaints of abdominal pain.    1.  Acute cholecystitis. Cholelithiasis s/p IR guided PERC drain placement on 09/02/2019 Elevated LFT, improved Pt was started on empiric Zosyn. General surgery was consulted and recommended PERC drain placement. Cultures neg growth.  Diet was resumed starting with clear liquid and advanced to regular, which pt tolerated.  Pt received 5 days of IV zosyn and will finish 5 more days of abx with oral cipro/flagyl after discharge (for a total of 10-day course).  Pt's son was taught how to flush the drain TID at home and to record outpatient.  Pt will follow up with GenSurg as outpatient.  Pt may need cholecystectomy in the future.  2.  Acute kidney injury, ruled out # chronic kidney disease stage IIIb. Cr baseline appeared to be around 1.  Cr peaked at 1.37 during hospitalization, which did not meet criteria for AKI.  Cr bump likely due to no PO intake, so MIVF was given.   MIVF was stopped the day prior to discharge, and home lasix  resumed per son's request due to his concern that pt was gaining weight (pt's Cr was back to baseline).  Pt appeared euvolemic on the day of discharge.  3.  Chronic systolic CHF. Paroxysmal A. fib. Echocardiogram performed shows EF of 45 to 50%.  Cardiology was consulted for management of anticoagulation and recommended continuing heparin for bridging.  Pt was transitioned back to home Eliquis prior to discharge.  Home lasix resumed prior to discharge, per son's request and pt's kidney function also back to baseline  4.  History of COPD. Continued inhalers and nebulizers. Currently not in any acute flareup.  5.  Pain control. Pt was scheduled tylenol 1g TID.  A short Rx of oxycodone 5 mg PRN was provided for break-thru pain after discharge.  6.  Constipation. Increasing bowel regimen.  7.  Thoracolumbar scoliosis, Severe. Combining this with COPD places the patient at high risk for poor respiratory outcome down the road.   Discharge Diagnoses:  Active Problems:   AF (paroxysmal atrial fibrillation) (HCC)   Chronic systolic CHF (congestive heart failure) (HCC)   Acute cholecystitis   Acute hypoxemic respiratory failure (HCC)   Acute kidney injury superimposed on CKD [facility])    Discharge Instructions:  Allergies as of 09/05/2019   No Known Allergies     Medication List    TAKE these medications   acetaminophen 500 MG tablet Commonly known as: TYLENOL Take 2 tablets (1,000 mg  total) by mouth 3 (three) times daily for 5 days. Then go back to as needed. What changed:   how much to take  when to take this  additional instructions   apixaban 5 MG Tabs tablet Commonly known as: ELIQUIS Take 1 tablet (5 mg total) by mouth 2 (two) times daily for 7 days.   budesonide 0.5 MG/2ML nebulizer solution Commonly known as: PULMICORT Take 0.5 mg by nebulization daily.   calcium-vitamin D 500-200 MG-UNIT tablet Take 1 tablet by mouth daily.   ciprofloxacin 250 MG  tablet Commonly known as: CIPRO Take 1 tablet (250 mg total) by mouth 2 (two) times daily for 5 days.   ferrous sulfate 325 (65 FE) MG tablet Take 325 mg by mouth daily with breakfast.   formoterol 20 MCG/2ML nebulizer solution Commonly known as: PERFOROMIST Inhale 20 mcg into the lungs every 12 (twelve) hours.   furosemide 40 MG tablet Commonly known as: LASIX Take 40 mg by mouth See admin instructions. Take 80 mg in the morning and 40 mg in the evening   ipratropium-albuterol 0.5-2.5 (3) MG/3ML Soln Commonly known as: DUONEB Inhale 3 mLs into the lungs every 8 (eight) hours as needed.   metroNIDAZOLE 500 MG tablet Commonly known as: FLAGYL Take 1 tablet (500 mg total) by mouth 3 (three) times daily for 5 days.   oxyCODONE 5 MG immediate release tablet Commonly known as: Oxy IR/ROXICODONE Take 1 tablet (5 mg total) by mouth every 12 (twelve) hours as needed for up to 5 days for moderate pain.   potassium chloride 10 MEQ tablet Commonly known as: KLOR-CON Take 10 mEq by mouth daily.   vitamin B-12 1000 MCG tablet Commonly known as: CYANOCOBALAMIN Take 1,000 mcg by mouth daily.       Follow-up Information    Henrene Dodge, MD. Go on 09/19/2019.   Specialty: General Surgery Why: follow up hospitalization for cholecystitis, has perc chole tube 2:30pm appointment Contact information: [address] [place] [address] [phone]        Lollie Marrow, MD. Go on 09/14/2019.   Specialty: Geriatric Medicine Why: 3:45pm appointmetn Contact information: [address] Marzella Schlein [address] [place] [address] [phone]           No Known Allergies   The results of significant diagnostics from this hospitalization (including imaging, microbiology, ancillary and laboratory) are listed below for reference.   Consultations:   Procedures/Studies: DG Chest 1 View  Result Date: 09/04/2019 CLINICAL DATA:  Lung crackles EXAM: CHEST  1 VIEW  COMPARISON:  04/30/2018 FINDINGS: Cardiomegaly with biventricular pacer. Interstitial coarsening at the bases that is similar to prior. There was airway thickening on a 2009 chest CT. There is no edema, consolidation, effusion, or pneumothorax. Prominent osteopenia and thoracolumbar scoliosis. IMPRESSION: Stable compared to 2019. no evidence of active disease. Electronically Signed   By: Marnee Spring M.D.   On: 09/04/2019 04:58   DG Abd 1 View  Result Date: 09/01/2019 CLINICAL DATA:  Abdominal pain, cholelithiasis EXAM: ABDOMEN - 1 VIEW COMPARISON:  08/31/2019 FINDINGS: Supine frontal view of the abdomen and pelvis demonstrates an unremarkable bowel gas pattern. The radiopaque gallstone seen on recent CT is not well visualized on this exam, due to respiratory motion and positioning. There are no abdominal masses. Right hip arthroplasty is noted. Severe left convex scoliosis of the thoracolumbar spine. IMPRESSION: 1. Unremarkable bowel gas pattern. 2. Please refer to recent CT and ultrasound examinations describing cholelithiasis. Electronically Signed   By:  Sharlet SalinaMichael  Brown M.D.   On: 09/01/2019 22:57   CT ABDOMEN PELVIS W CONTRAST  Result Date: 08/31/2019 CLINICAL DATA:  Onset right upper quadrant pain last night. EXAM: CT ABDOMEN AND PELVIS WITH CONTRAST TECHNIQUE: Multidetector CT imaging of the abdomen and pelvis was performed using the standard protocol following bolus administration of intravenous contrast. CONTRAST:  75 mL OMNIPAQUE IOHEXOL 300 MG/ML  SOLN COMPARISON:  None. FINDINGS: Lower chest: Lung bases clear. There is cardiomegaly. No pleural or pericardial effusion. Hepatobiliary: A 4 cm in diameter lamellated stone is seen in the gallbladder. The gallbladder is dilated and its wall appears mildly thickened. The liver and biliary tree are normal in appearance. Pancreas: Unremarkable. No pancreatic ductal dilatation or surrounding inflammatory changes. Spleen: Normal in size without focal  abnormality. Adrenals/Urinary Tract: Adrenal glands are unremarkable. Kidneys are normal, without renal calculi, focal lesion, or hydronephrosis. Bladder is unremarkable. Stomach/Bowel: Stomach is within normal limits. Appendix appears normal. No evidence of bowel wall thickening, distention, or inflammatory changes. Vascular/Lymphatic: Aortic atherosclerosis. No enlarged abdominal or pelvic lymph nodes. Reproductive: Uterus and bilateral adnexa are unremarkable. Other: None. Musculoskeletal: There is severe convex left thoracolumbar scoliosis and multilevel degenerative change. The patient is status post right hip replacement. No acute or focal abnormality. IMPRESSION: 4 cm gallstone with gallbladder wall thickening and dilatation worrisome for acute cholecystitis. Recommend right upper quadrant ultrasound for further evaluation. Atherosclerosis. Scoliosis and multilevel degenerative disease. Electronically Signed   By: Drusilla Kannerhomas  Dalessio M.D.   On: 08/31/2019 12:56   ECHOCARDIOGRAM COMPLETE  Result Date: 09/02/2019    ECHOCARDIOGRAM REPORT   Patient Name:   Genia HotterGALINA Mellen Date of Exam: 09/02/2019 Medical Rec #:  [id number]     Height:       63.0 in Accession #:    [id number]    Weight:       180.0 lb Date of Birth:  1927/01/07     BSA:          1.85 m Patient Age:    86 years      BP:           113/56 mmHg Patient Gender: F             HR:           70 bpm. Exam Location:  [facility] Procedure: 2D Echo, Cardiac Doppler and Color Doppler Indications:     A-fib 427.30  History:         Patient has no prior history of Echocardiogram examinations.                  CHF; Arrythmias:Atrial Fibrillation.  Sonographer:     Neysa Bonitohristy Roar Referring Phys:  [id number] Alford HighlandICHARD WIETING Diagnosing Phys: Arnoldo HookerBruce Kowalski MD IMPRESSIONS  1. Septal dysfunction likely due to pacer stimulation. Left ventricular ejection fraction, by estimation, is 45 to 50%. The left ventricle has mildly decreased function. The left ventricle demonstrates regional  wall motion abnormalities (see scoring diagram/findings for description). There is mildly increased left ventricular hypertrophy. Left ventricular diastolic parameters were normal.  2. Right ventricular systolic function is normal. The right ventricular size is normal. There is moderately elevated pulmonary artery systolic pressure.  3. Left atrial size was moderately dilated.  4. Right atrial size was moderately dilated.  5. The mitral valve is normal in structure and function. Moderate mitral valve regurgitation.  6. Tricuspid valve regurgitation is moderate.  7. The aortic valve is grossly normal. Aortic valve regurgitation is mild. Mild aortic valve  stenosis. FINDINGS  Left Ventricle: Septal dysfunction likely due to pacer stimulation. Left ventricular ejection fraction, by estimation, is 45 to 50%. The left ventricle has mildly decreased function. The left ventricle demonstrates regional wall motion abnormalities. There is mildly increased left ventricular hypertrophy. Left ventricular diastolic parameters were normal. Right Ventricle: The right ventricular size is normal. No increase in right ventricular wall thickness. Right ventricular systolic function is normal. There is moderately elevated pulmonary artery systolic pressure. The tricuspid regurgitant velocity is 3.27 m/s, and with an assumed right atrial pressure of 10 mmHg, the estimated right ventricular systolic pressure is 52.8 mmHg. Left Atrium: Left atrial size was moderately dilated. Right Atrium: Right atrial size was moderately dilated. Pericardium: There is no evidence of pericardial effusion. Mitral Valve: The mitral valve is normal in structure and function. Moderate mitral valve regurgitation. Tricuspid Valve: The tricuspid valve is normal in structure. Tricuspid valve regurgitation is moderate. Aortic Valve: The aortic valve is grossly normal. Aortic valve regurgitation is mild. Mild aortic stenosis is present. Aortic valve mean gradient  measures 9.3 mmHg. Aortic valve peak gradient measures 18.8 mmHg. Aortic valve area, by VTI measures 0.93 cm. Pulmonic Valve: The pulmonic valve was normal in structure. Pulmonic valve regurgitation is not visualized. Aorta: The aortic root is normal in size and structure. IAS/Shunts: No atrial level shunt detected by color flow Doppler.  LEFT VENTRICLE PLAX 2D LVIDd:         4.40 cm  Diastology LVIDs:         3.19 cm  LV e' lateral:   10.20 cm/s LV PW:         1.09 cm  LV E/e' lateral: 15.1 LV IVS:        1.17 cm  LV e' medial:    6.64 cm/s LVOT diam:     1.60 cm  LV E/e' medial:  23.2 LV SV:         33.38 ml LV SV Index:   24.30 LVOT Area:     2.01 cm  RIGHT VENTRICLE RV Mid diam:    3.06 cm RV S prime:     11.90 cm/s LEFT ATRIUM             Index       RIGHT ATRIUM           Index LA diam:        4.70 cm 2.54 cm/m  RA Area:     21.10 cm LA Vol (A2C):   92.7 ml 50.14 ml/m RA Volume:   68.50 ml  37.05 ml/m LA Vol (A4C):   69.5 ml 37.59 ml/m LA Biplane Vol: 82.2 ml 44.46 ml/m  AORTIC VALVE                    PULMONIC VALVE AV Area (Vmax):    0.90 cm     PV Vmax:        0.86 m/s AV Area (Vmean):   0.85 cm     PV Peak grad:   2.9 mmHg AV Area (VTI):     0.93 cm     RVOT Peak grad: 3 mmHg AV Vmax:           217.00 cm/s AV Vmean:          136.333 cm/s AV VTI:            0.357 m AV Peak Grad:      18.8 mmHg AV Mean Grad:  9.3 mmHg LVOT Vmax:         96.90 cm/s LVOT Vmean:        57.400 cm/s LVOT VTI:          0.166 m LVOT/AV VTI ratio: 0.46  AORTA Ao Root diam: 2.60 cm MITRAL VALVE                TRICUSPID VALVE MV Area (PHT): 4.36 cm     TR Peak grad:   42.8 mmHg MV Decel Time: 174 msec     TR Vmax:        327.00 cm/s MV E velocity: 154.00 cm/s                             SHUNTS                             Systemic VTI:  0.17 m                             Systemic Diam: 1.60 cm Arnoldo Hooker MD Electronically signed by Arnoldo Hooker MD Signature Date/Time: 09/02/2019/4:22:46 PM    Final    CT PERC  CHOLECYSTOSTOMY  Result Date: 09/03/2019 INDICATION: Acute cholecystitis due to huge obstructing gallstone and need for percutaneous cholecystostomy tube to decompress the gallbladder. The patient is not a candidate currently for surgical cholecystectomy. EXAM: CT PERCUTANEOUS CHOLECYSTOSTOMY MEDICATIONS: No additional medications. ANESTHESIA/SEDATION: Moderate (conscious) sedation was employed during this procedure. A total of Versed 1.5 mg and Fentanyl 75 mcg was administered intravenously. Moderate Sedation Time: 21 minutes. The patient's level of consciousness and vital signs were monitored continuously by radiology nursing throughout the procedure under my direct supervision. FLUOROSCOPY TIME:  CT guidance. COMPLICATIONS: None immediate. PROCEDURE: Informed written consent was obtained from the patient after a thorough discussion of the procedural risks, benefits and alternatives. All questions were addressed. A [place] medical interpreter was utilized to obtain consent. Maximal Sterile Barrier Technique was utilized including caps, mask, sterile gowns, sterile gloves, sterile drape, hand hygiene and skin antiseptic. A timeout was performed prior to the initiation of the procedure. CT was performed in a supine position to localize the gallbladder. Under CT guidance, an 18 gauge trocar needle was advanced from a right abdominal approach into the gallbladder lumen. After return of bile, a guidewire was advanced into the gallbladder. The tract was dilated and a 10 French percutaneous drainage catheter advanced. Catheter position was confirmed by CT. A bile sample was withdrawn and sent for culture analysis. The drain was flushed and connected to a gravity drainage bag. It was secured at the skin with a Prolene retention suture and StatLock device. FINDINGS: The 48 French drainage catheter was advanced into the gallbladder with the distal portion of the catheter lying just beyond the huge gallstone within the  gallbladder lumen. Initial dark bile sample was withdrawn and sent for culture analysis. After some. Of drainage, there was more bloody drainage from the catheter towards the end of the procedure. IMPRESSION: CT-guided percutaneous cholecystostomy tube placement with 10 French catheter advanced into the gallbladder. A bile sample was sent for culture analysis. Electronically Signed   By: Irish Lack M.D.   On: 09/03/2019 08:19   US Abdomen Limited RUQ  Result Date: 08/31/2019 CLINICAL DATA:  Right upper quadrant abdominal pain. EXAM: ULTRASOUND ABDOMEN LIMITED RIGHT UPPER QUADRANT  COMPARISON:  None. FINDINGS: Gallbladder: 4 cm gallstone is noted. No significant gallbladder wall thickening or pericholecystic fluid is noted. Positive sonographic Murphy's sign is noted. Common bile duct: Diameter: 3 mm which is within normal limits. Liver: No focal lesion identified. Within normal limits in parenchymal echogenicity. Portal vein is patent on color Doppler imaging with normal direction of blood flow towards the liver. Other: None. IMPRESSION: Large solitary gallstone is noted. No gallbladder wall thickening or pericholecystic fluid is noted, but positive sonographic Murphy's sign is noted. If there is concern for cholecystitis, HIDA scan is recommended for further evaluation. Electronically Signed   By: Lupita Raider M.D.   On: 08/31/2019 14:19      Labs: BNP (last 3 results) No results for input(s): BNP in the last 8760 hours. Basic Metabolic Panel: Recent Labs  Lab 09/01/19 2234 09/02/19 0456 09/03/19 0520 09/04/19 0538 09/05/19 0641  NA 140 140 139 142 143  K 3.5 3.5 3.4* 3.4* 2.9*  CL 97* 98 97* 101 100  CO2 29 31 30  33* 32  GLUCOSE 167* 162* 125* 98 93  BUN 19 24* 33* 29* 26*  CREATININE 1.13* 1.37* 1.34* 1.16* 1.07*  CALCIUM 8.4* 8.6* 8.6* 8.6* 8.6*  MG  --   --   --   --  2.3   Liver Function Tests: Recent Labs  Lab 08/31/19 1103 09/01/19 2234 09/02/19 0456 09/03/19 0520  09/04/19 0538  AST 22 171* 140* 83* 54*  ALT 14 150* 139* 98* 74*  ALKPHOS 68 154* 141* 130* 96  BILITOT 1.0 1.8* 1.6* 1.8* 1.2  PROT 7.4 6.9 6.4* 6.5 5.9*  ALBUMIN 3.9 3.5 3.3* 3.0* 2.8*   Recent Labs  Lab 08/31/19 1103 09/01/19 2234  LIPASE 28 23   No results for input(s): AMMONIA in the last 168 hours. CBC: Recent Labs  Lab 09/01/19 2234 09/02/19 0456 09/03/19 0519 09/04/19 0538 09/05/19 0641  WBC 13.5* 13.7* 9.6 7.7 4.9  HGB 10.9* 11.0* 9.9* 9.7* 9.6*  HCT 35.1* 36.0 32.1* 30.7* 30.6*  MCV 94.6 96.0 94.1 93.0 93.6  PLT 167 154 169 172 192   Cardiac Enzymes: No results for input(s): CKTOTAL, CKMB, CKMBINDEX, TROPONINI in the last 168 hours. BNP: Invalid input(s): POCBNP CBG: No results for input(s): GLUCAP in the last 168 hours. D-Dimer No results for input(s): DDIMER in the last 72 hours. Hgb A1c No results for input(s): HGBA1C in the last 72 hours. Lipid Profile No results for input(s): CHOL, HDL, LDLCALC, TRIG, CHOLHDL, LDLDIRECT in the last 72 hours. Thyroid function studies No results for input(s): TSH, T4TOTAL, T3FREE, THYROIDAB in the last 72 hours.  Invalid input(s): FREET3 Anemia work up No results for input(s): VITAMINB12, FOLATE, FERRITIN, TIBC, IRON, RETICCTPCT in the last 72 hours. Urinalysis    Component Value Date/Time   COLORURINE YELLOW (A) 08/31/2019 2122   APPEARANCEUR HAZY (A) 08/31/2019 2122   LABSPEC 1.030 08/31/2019 2122   PHURINE 6.0 08/31/2019 2122   GLUCOSEU NEGATIVE 08/31/2019 2122   HGBUR SMALL (A) 08/31/2019 2122   BILIRUBINUR NEGATIVE 08/31/2019 2122   KETONESUR NEGATIVE 08/31/2019 2122   PROTEINUR NEGATIVE 08/31/2019 2122   NITRITE NEGATIVE 08/31/2019 2122   LEUKOCYTESUR LARGE (A) 08/31/2019 2122   Sepsis Labs Invalid input(s): PROCALCITONIN,  WBC,  LACTICIDVEN Microbiology Recent Results (from the past 240 hour(s))  Respiratory Panel by RT PCR (Flu A&B, Covid) - Nasopharyngeal Swab     Status: None   Collection  Time: 08/31/19  4:36 PM   Specimen: Nasopharyngeal  Swab  Result Value Ref Range Status   SARS Coronavirus 2 by RT PCR NEGATIVE NEGATIVE Final    Comment: (NOTE) SARS-CoV-2 target nucleic acids are NOT DETECTED. The SARS-CoV-2 RNA is generally detectable in upper respiratoy specimens during the acute phase of infection. The lowest concentration of SARS-CoV-2 viral copies this assay can detect is 131 copies/mL. A negative result does not preclude SARS-Cov-2 infection and should not be used as the sole basis for treatment or other patient management decisions. A negative result may occur with  improper specimen collection/handling, submission of specimen other than nasopharyngeal swab, presence of viral mutation(s) within the areas targeted by this assay, and inadequate number of viral copies (<131 copies/mL). A negative result must be combined with clinical observations, patient history, and epidemiological information. The expected result is Negative. Fact Sheet for Patients:  https://www.moore.com/https://www.fda.gov/media/142436/download Fact Sheet for Healthcare Providers:  https://www.young.biz/https://www.fda.gov/media/142435/download This test is not yet ap proved or cleared by the [place] FDA and  has been authorized for detection and/or diagnosis of SARS-CoV-2 by FDA under an Emergency Use Authorization (EUA). This EUA will remain  in effect (meaning this test can be used) for the duration of the COVID-19 declaration under Section 564(b)(1) of the Act, 21 U.S.C. section 360bbb-3(b)(1), unless the authorization is terminated or revoked sooner.    Influenza A by PCR NEGATIVE NEGATIVE Final   Influenza B by PCR NEGATIVE NEGATIVE Final    Comment: (NOTE) The Xpert Xpress SARS-CoV-2/FLU/RSV assay is intended as an aid in  the diagnosis of influenza from Nasopharyngeal swab specimens and  should not be used as a sole basis for treatment. Nasal washings and  aspirates are unacceptable for Xpert Xpress  SARS-CoV-2/FLU/RSV  testing. Fact Sheet for Patients: https://www.moore.com/https://www.fda.gov/media/142436/download Fact Sheet for Healthcare Providers: https://www.young.biz/https://www.fda.gov/media/142435/download This test is not yet approved or cleared by the [place] FDA and  has been authorized for detection and/or diagnosis of SARS-CoV-2 by  FDA under an Emergency Use Authorization (EUA). This EUA will remain  in effect (meaning this test can be used) for the duration of the  Covid-19 declaration under Section 564(b)(1) of the Act, 21  U.S.C. section 360bbb-3(b)(1), unless the authorization is  terminated or revoked. Performed at [facility], [address] [place], [place] [address]   Urine Culture     Status: None   Collection Time: 08/31/19  9:22 PM   Specimen: Urine, Clean Catch  Result Value Ref Range Status   Specimen Description   Final    URINE, CLEAN CATCH Performed at [facility], [address] [place], [place] [address]    Special Requests   Final    Normal Performed at [facility], [address] [place], [place] [address]    Culture   Final    NO GROWTH Performed at [facility], [address] [place], [place] [address]    Report Status 09/04/2019 FINAL  Final  Aerobic/Anaerobic Culture (surgical/deep wound)     Status: None (Preliminary result)   Collection Time: 09/02/19  5:40 PM   Specimen: Gallbladder; Bile  Result Value Ref Range Status   Specimen Description   Final    GALL BLADDER Performed at [facility], [address] [place], [place] [address]    Special Requests   Final    Normal Performed at [facility], [address] [place], [place] [address]    Gram Stain   Final    RARE WBC PRESENT,BOTH PMN AND MONONUCLEAR NO  ORGANISMS SEEN    Culture   Final    NO GROWTH 2 DAYS Performed at [facility], [place] [address], [place], [place] [address]    Report Status PENDING  Incomplete     Total time  spend on discharging this patient, including the last patient exam, discussing the hospital stay, instructions for ongoing care as it relates to all pertinent caregivers, as well as preparing the medical discharge records, prescriptions, and/or referrals as applicable, is 40 minutes.    Enzo Bi, MD  [facility] 09/05/2019, 11:35 AM  If 7PM-7AM, please contact night-coverage

## 2019-09-05 NOTE — Progress Notes (Addendum)
[facility] SURGICAL PROGRESS NOTE (cpt [id number])  Hospital Day(s): 5.   Interval History: Patient seen and examined, No acute events overnight. Patient's son at bedside helping with history. Abdominal pain is solely at drain site and epigastrium. No fever, chills, nausea, or emesis.. Labs pending. Diet advanced to regular diet yesterday and tolerating well.    Review of Systems:  Constitutional: denies fever, chills  HEENT: denies cough or congestion  Respiratory: denies any shortness of breath  Cardiovascular: denies chest pain or palpitations  Gastrointestinal: + abdominal pain (Improved - at drain site/epigastrium), denied N/V, or diarrhea/and bowel function as per interval history Genitourinary: denies burning with urination or urinary frequency   Vital signs in last 24 hours: [min-max] current  Temp:  [97.7 F (36.5 C)-97.9 F (36.6 C)] 97.7 F (36.5 C) (02/17 0551) Pulse Rate:  [70] 70 (02/17 0551) Resp:  [16-20] 16 (02/17 0551) BP: (125-163)/(65-73) 163/65 (02/17 0551) SpO2:  [95 %-99 %] 95 % (02/17 0551) Weight:  [58.2 kg] 58.2 kg (02/17 0551)     Height: 5\' 3"  (160 cm) Weight: 58.2 kg BMI (Calculated): 22.73   Intake/Output last 2 shifts:  02/16 0701 - 02/17 0700 In: 246.8 [P.O.:30; I.V.:60.2; IV Piggyback:156.6] Out: 1070 [Urine:740; Drains:330]   Physical Exam:  Constitutional: alert, cooperative and no distress  HENT: normocephalic without obvious abnormality  Eyes: PERRL, EOM's grossly intact and symmetric  Respiratory: breathing non-labored at rest  Cardiovascular: regular rate and sinus rhythm  Gastrointestinal: soft, much improved abdominal soreness, and non-distended. No rebound or guarding. Cholecystostomy tube in the RUQ with bilious output Musculoskeletal: no edema or wounds, motor and sensation grossly intact, NT    Labs:  CBC Latest Ref Rng & Units 09/04/2019 09/03/2019 09/02/2019  WBC 4.0 - 10.5 K/uL 7.7 9.6 13.7(H)  Hemoglobin 12.0 -  15.0 g/dL 9.7(L) 9.9(L) 11.0(L)  Hematocrit 36.0 - 46.0 % 30.7(L) 32.1(L) 36.0  Platelets 150 - 400 K/uL 172 169 154   CMP Latest Ref Rng & Units 09/04/2019 09/03/2019 09/02/2019  Glucose 70 - 99 mg/dL 98 125(H) 162(H)  BUN 8 - 23 mg/dL 29(H) 33(H) 24(H)  Creatinine 0.44 - 1.00 mg/dL 1.16(H) 1.34(H) 1.37(H)  Sodium 135 - 145 mmol/L 142 139 140  Potassium 3.5 - 5.1 mmol/L 3.4(L) 3.4(L) 3.5  Chloride 98 - 111 mmol/L 101 97(L) 98  CO2 22 - 32 mmol/L 33(H) 30 31  Calcium 8.9 - 10.3 mg/dL 8.6(L) 8.6(L) 8.6(L)  Total Protein 6.5 - 8.1 g/dL 5.9(L) 6.5 6.4(L)  Total Bilirubin 0.3 - 1.2 mg/dL 1.2 1.8(H) 1.6(H)  Alkaline Phos 38 - 126 U/L 96 130(H) 141(H)  AST 15 - 41 U/L 54(H) 83(H) 140(H)  ALT 0 - 44 U/L 74(H) 98(H) 139(H)     Imaging studies: No new pertinent imaging studies   Assessment/Plan: (ICD-10's: K81.0) 86 y.o. female with clinical improvement of abdominal pain attributable likely to acute cholecystitis s/p percutaneous cholecystostomy tube placement on 02/14   - Continue Regular Diet  - Continue IV ABx; transition to PO at discharge; complete 10 days  - Continue percutaneous cholecystostomy tube; monitor output   - pain control prn  - Mobilize as tolerates  - Further management per medicine team; we will follow   - Discharge Planning: Clear for discharge from surgical standpoint, follow up with general surgery in 2 weeks, ABx for home x10 days  All of the above findings and recommendations were discussed with the patient and her son, and the medical team, and all of patient's questions  were answered to their expressed satisfaction.  -- Lynden Oxford, PA-C [facility] 09/05/2019, 7:32 AM [phone] M-F: 7am - 4pm

## 2019-09-05 NOTE — Progress Notes (Signed)
Diana Powell to be D/C'd home with son per MD order.  Discussed prescriptions and follow up appointments with the patient. Prescriptions given to patient, medication list explained in detail. Pt verbalized understanding.  Allergies as of 09/05/2019   No Known Allergies      Medication List     TAKE these medications    acetaminophen 500 MG tablet Commonly known as: TYLENOL Take 2 tablets (1,000 mg total) by mouth 3 (three) times daily for 5 days. Then go back to as needed. What changed:  how much to take when to take this additional instructions   apixaban 5 MG Tabs tablet Commonly known as: ELIQUIS Take 1 tablet (5 mg total) by mouth 2 (two) times daily for 7 days.   budesonide 0.5 MG/2ML nebulizer solution Commonly known as: PULMICORT Take 0.5 mg by nebulization daily.   calcium-vitamin D 500-200 MG-UNIT tablet Take 1 tablet by mouth daily.   ciprofloxacin 250 MG tablet Commonly known as: CIPRO Take 1 tablet (250 mg total) by mouth 2 (two) times daily for 5 days.   ferrous sulfate 325 (65 FE) MG tablet Take 325 mg by mouth daily with breakfast.   formoterol 20 MCG/2ML nebulizer solution Commonly known as: PERFOROMIST Inhale 20 mcg into the lungs every 12 (twelve) hours.   furosemide 40 MG tablet Commonly known as: LASIX Take 40 mg by mouth See admin instructions. Take 80 mg in the morning and 40 mg in the evening   ipratropium-albuterol 0.5-2.5 (3) MG/3ML Soln Commonly known as: DUONEB Inhale 3 mLs into the lungs every 8 (eight) hours as needed.   metroNIDAZOLE 500 MG tablet Commonly known as: FLAGYL Take 1 tablet (500 mg total) by mouth 3 (three) times daily for 5 days.   oxyCODONE 5 MG immediate release tablet Commonly known as: Oxy IR/ROXICODONE Take 1 tablet (5 mg total) by mouth every 12 (twelve) hours as needed for up to 5 days for moderate pain.   potassium chloride 10 MEQ tablet Commonly known as: KLOR-CON Take 10 mEq by mouth daily.   vitamin  B-12 1000 MCG tablet Commonly known as: CYANOCOBALAMIN Take 1,000 mcg by mouth daily.        Vitals:   09/05/19 0744 09/05/19 0753  BP:    Pulse:    Resp:    Temp:    SpO2: 95% 95%    Skin clean, dry and intact without evidence of skin break down, no evidence of skin tears noted. IV catheter discontinued intact. Site without signs and symptoms of complications. Dressing and pressure applied. Pt denies pain at this time. No complaints noted.  An After Visit Summary was printed and given to the patient. Patient escorted via WC, and D/C home via private auto.  Darrill Vreeland A [facility]

## 2019-09-05 NOTE — Progress Notes (Signed)
PHARMACY NOTE:  ANTIMICROBIAL RENAL DOSAGE ADJUSTMENT  Current antimicrobial regimen includes a mismatch between antimicrobial dosage and estimated renal function.  As per policy approved by the Pharmacy & Therapeutics and Medical Executive Committees, the antimicrobial dosage will be adjusted accordingly.  Current antimicrobial dosage:  Ciprofloxacin 500mg  BID  Renal Function: Estimated Creatinine Clearance: 28.9 mL/min (A) (by C-G formula based on SCr of 1.07 mg/dL (H)).  Antimicrobial dosage has been changed to:  Ciprofloxacin 250mg  BID for CrCl < 83ml/min    Thank you for allowing pharmacy to be a part of this patient's care.  , PharmD, BCPS Clinical Pharmacist 09/05/2019 9:50 AM

## 2019-09-07 ENCOUNTER — Telehealth: Payer: Self-pay | Admitting: *Deleted

## 2019-09-07 ENCOUNTER — Telehealth: Payer: Self-pay

## 2019-09-07 MED ORDER — SODIUM CHLORIDE 0.9 % IJ SOLN: 5.0000 mL | Freq: Two times a day (BID) | INTRAMUSCULAR | 0 refills | Status: AC

## 2019-09-07 NOTE — Telephone Encounter (Signed)
Pharmacy called to gather more information about the syringes that were sent over for patient. Pharmacist requested the OK to provide the patient like the patient received in the hospital. I gave OK for patient to receive the syringes. Pharmacist verbalizes understanding.

## 2019-09-07 NOTE — Telephone Encounter (Signed)
Patients son Trinna Post called stated that this patient was seen by Dr.Piscoya in the hospital and she has a drain, he needs some information on how to care for the drain and needs a prescription for supplies. Please call and advise

## 2019-09-07 NOTE — Telephone Encounter (Signed)
Spoke patient's son Diana Powell and he is requesting a refill on the syringes for the saline flush for patient. Per Dr.Piscoya gave OK to refill syringe for saline flush. Patient's son was also advised to give patient sponge baths or if that becomes troublesome to give patient a shower under running warm soapy water, but patient is to refrain from submerging in bath tubs or pools of water. Patient's son verbalized understanding.

## 2019-09-08 LAB — AEROBIC/ANAEROBIC CULTURE W GRAM STAIN (SURGICAL/DEEP WOUND)
Culture: NO GROWTH
Special Requests: NORMAL

## 2019-09-11 ENCOUNTER — Ambulatory Visit: Payer: Medicare Other

## 2019-09-11 ENCOUNTER — Other Ambulatory Visit: Payer: Self-pay

## 2019-09-11 DIAGNOSIS — M25551 Pain in right hip: Secondary | ICD-10-CM | POA: Diagnosis not present

## 2019-09-11 DIAGNOSIS — M6281 Muscle weakness (generalized): Secondary | ICD-10-CM | POA: Diagnosis present

## 2019-09-11 DIAGNOSIS — R262 Difficulty in walking, not elsewhere classified: Secondary | ICD-10-CM | POA: Diagnosis present

## 2019-09-12 NOTE — Therapy (Addendum)
[facility] PHYSICAL AND SPORTS MEDICINE [address], [place], [address] Phone: [phone]   Fax:  [phone]  Physical Therapy Treatment/ Progress Note  Patient Details  Name: Diana Powell MRN: [id number] Date of Birth: 12-28-1926 Referring Provider (PT): Bloomberg MD   Reporting Period: 08/02/2019 - 09/11/2019  Encounter Date: 09/11/2019  PT End of Session - 09/11/19 1505    Visit Number  18    Number of Visits  28    Date for PT Re-Evaluation  10/23/19    Authorization Type  1/10    PT Start Time  1430    PT Stop Time  1515    PT Time Calculation (min)  45 min    Equipment Utilized During Treatment  Gait belt    Activity Tolerance  Patient limited by fatigue;Patient tolerated treatment well    Behavior During Therapy  [facility] for tasks assessed/performed       Past Medical History:  Diagnosis Date  . Atrial fibrillation (HCC)   . Chronic systolic heart failure (HCC)   . COPD (chronic obstructive pulmonary disease) (HCC)   . Osteoarthritis, multiple sites   . Osteoporosis, post-menopausal   . Presence of permanent cardiac pacemaker     Past Surgical History:  Procedure Laterality Date  . CARDIOVERSION  ~2009  . HIP FRACTURE SURGERY Bilateral [phone]   each hip fractured at separate times  . INSERT / REPLACE / REMOVE PACEMAKER    . TOTAL HIP ARTHROPLASTY Right 04/20/2018   Procedure: TOTAL HIP CONVERSION;  Surgeon: Kennedy Bucker, MD;  Location: [facility] ORS;  Service: Orthopedics;  Laterality: Right;    There were no vitals filed for this visit.  Subjective Assessment - 09/11/19 1447    Subjective  Patient's son reports she was in the hospital for gallbladder inflammation. He states now she has a gallbladder pump. The son also reports increased difficulty with balancing since being in the hospital.    Patient is accompained by:  Family member    Pertinent History  CVA with perserved EF, self-reported 'Lung condition', R  THA, DVT, wrist fx    Limitations  Lifting;Standing;Walking    Patient Stated Goals  To walk around easier    Currently in Pain?  No/denies    Pain Onset  More than a month ago       TREATMENT Therapeutic Exercise: Sit to stands - x 10 , x 6  Single leg hip abduction in standing - x5  Hip hikes in sitting with 5# weights on ankles - x 10 LAQ in sitting - x 10 5# Side stepping with UE support - x 20 Single leg - leg kicks in standing with foot taps - x 20  Ambulation with walking - 250, 326ft   Performed exercises to address hip weakness and improve standing tolerance   PT Education - 09/11/19 1459    Education Details  Progression into standing; form/technique with exercise    Person(s) Educated  Patient;Caregiver(s);Child(ren)    Methods  Explanation;Demonstration    Comprehension  Verbalized understanding;Returned demonstration          PT Long Term Goals - 09/12/19 0926      PT LONG TERM GOAL #1   Title  Patient will be independent with HEP to continue benefits of therapy until after discharge.     Baseline  Dependent with form/technique; Independent with form/technique;    Time  6    Period  Weeks    Status  Achieved  PT LONG TERM GOAL #2   Title  Patient will be able to ambulate over 769ft ith 57minWT to improve cardiovascular endurance and improve ability to walk in the community.    Baseline  373ft; 05/22/2019: 624ft; 06/19/2019: 555ft; 08/01/2019: Deferred    Time  6    Period  Weeks    Status  On-going      PT LONG TERM GOAL #3   Title  Patient will be able to perform the TUG in under 12 sec to indicate singificant improvement gait speed and dynamic balance with use of FWW    Baseline  27sec; 05/22/2019: 16sec; 06/19/2019: 15.5sec; 08/01/2019: Deferred; 09/12/2019: 20sec    Time  6    Period  Weeks    Status  On-going      PT LONG TERM GOAL #4   Title  Patient will improve 5 x STS to under 12sec to decrease fall risk and improve LE functional  strength.    Baseline  5xSTS: 27 sec; 05/22/2019: 20 sec; 06/19/2019: 20sec; 08/01/2019: Deferred; 09/12/2019; 24    Time  6    Period  Weeks    Status  On-going      PT LONG TERM GOAL #5   Title  Patient will improve gait speed to > .52m/s to indicate significant improvement with ability to community ambulate with decreased fall risk    Baseline  .37 m/s; 05/22/2019: .69 m/s; 06/19/2019:  .73 m/s; 08/01/2019: Deferred; 09/12/2019: .5 m/s    Time  6    Period  Weeks    Status  On-going            Plan - 09/12/19 0908    Clinical Impression Statement  Patient's SpO2 >95% during the entirity of the session. However, patient continues to have DOE with ambulation requiring sitting rest breaks after 356ft x 2. Patient demonstrates significant limitations with standing tolerance as well as requiring sitting rest breaks after ~1 min of standing. Patient demonstrates improvement overall and patient will benefit from further skilled therapy to return to prior level of function.    Personal Factors and Comorbidities  Comorbidity 2    Comorbidities  DVT, THA, CVA, "lung disorder"    Examination-Activity Limitations  Carry;Lift;Locomotion Level;Stand;Stairs    Examination-Participation Restrictions  Cleaning;Community Activity;Meal Prep    Stability/Clinical Decision Making  Evolving/Moderate complexity    Rehab Potential  Fair    Clinical Impairments Affecting Rehab Potential  (+) highly motivated, family support (-) age, coormorbities    PT Frequency  2x / week    PT Duration  6 weeks    PT Treatment/Interventions  Electrical Stimulation;Iontophoresis 4mg /ml Dexamethasone;Aquatic Therapy;Cryotherapy;Moist Heat;Therapeutic activities;Patient/family education;Manual techniques;Balance training;Stair training;Gait training;Neuromuscular re-education;Therapeutic exercise;Passive range of motion    PT Next Visit Plan  Progress strengthening and balance    PT Home Exercise Plan  See education section     Consulted and Agree with Plan of Care  Patient       Patient will benefit from skilled therapeutic intervention in order to improve the following deficits and impairments:  Abnormal gait, Pain, Decreased coordination, Decreased mobility, Increased muscle spasms, Postural dysfunction, Decreased endurance, Decreased range of motion, Decreased strength, Decreased balance, Difficulty walking, Decreased activity tolerance  Visit Diagnosis: Pain in right hip  Difficulty in walking, not elsewhere classified  Muscle weakness (generalized)     Problem List Patient Active Problem List   Diagnosis Date Noted  . Acute hypoxemic respiratory failure ([place])   . Acute kidney injury superimposed  on CKD (HCC)   . Acute cholecystitis 08/31/2019  . RUQ pain   . Chronic obstructive pulmonary disease (HCC)   . Acute DVT (deep venous thrombosis) (HCC) 04/29/2018  . 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 AF (paroxysmal atrial fibrillation) (HCC)   . Chronic systolic CHF (congestive heart failure) (HCC)   . Osteoporosis, post-menopausal   . Osteoarthritis, multiple sites     [name], PT DPT 09/12/2019, 9:33 AM  [facility] PHYSICAL AND SPORTS MEDICINE [address], [place], [address] Phone: [phone]   Fax:  [phone]  Name: Diana Powell MRN: [id number] Date of Birth: 03/20/1927

## 2019-09-19 ENCOUNTER — Ambulatory Visit (INDEPENDENT_AMBULATORY_CARE_PROVIDER_SITE_OTHER): Payer: Medicare Other | Admitting: Surgery

## 2019-09-19 ENCOUNTER — Telehealth: Payer: Self-pay

## 2019-09-19 ENCOUNTER — Encounter: Payer: Self-pay | Admitting: Surgery

## 2019-09-19 ENCOUNTER — Other Ambulatory Visit: Payer: Self-pay

## 2019-09-19 VITALS — BP 119/76 | HR 70 | Temp 96.6°F | Resp 12 | Ht 60.0 in | Wt 121.0 lb

## 2019-09-19 DIAGNOSIS — K810 Acute cholecystitis: Secondary | ICD-10-CM

## 2019-09-19 NOTE — Progress Notes (Signed)
09/19/2019  History of Present Illness: Diana Powell is a 86 y.o. female presenting for follow up of cholecystitis.  She was admitted on 2/12 with RUQ abdominal pain and needed a percutaneous cholecystostomy tube on 2/14.  She was discharged on 2/17 on an antibiotic course and with her drain in place. Interpreter is in room today with her and her son.  Today she reports that she's doing better than in the hospital.  She is no longer bloated or distended, is having more regular bowel function, and does not have any further pain.  She will only have some discomfort at the drain site.  Her son has been keeping records of the drain output and was worried because a few days ago, the output dropped a lot, but then picked up again.  Denies any fevers, chills, nausea, vomiting.  She is working with physical therapy.  She's lost weight and still needs to get stronger.  Past Medical History: Past Medical History:  Diagnosis Date  . Atrial fibrillation (HCC)   . Chronic systolic heart failure (HCC)   . COPD (chronic obstructive pulmonary disease) (HCC)   . Osteoarthritis, multiple sites   . Osteoporosis, post-menopausal   . Presence of permanent cardiac pacemaker      Past Surgical History: Past Surgical History:  Procedure Laterality Date  . CARDIOVERSION  ~2009  . HIP FRACTURE SURGERY Bilateral [phone]   each hip fractured at separate times  . INSERT / REPLACE / REMOVE PACEMAKER    . TOTAL HIP ARTHROPLASTY Right 04/20/2018   Procedure: TOTAL HIP CONVERSION;  Surgeon: Kennedy Bucker, MD;  Location: [facility] ORS;  Service: Orthopedics;  Laterality: Right;    Home Medications: Prior to Admission medications   Medication Sig Start Date End Date Taking? Authorizing Provider  apixaban (ELIQUIS) 5 MG TABS tablet Take 1 tablet (5 mg total) by mouth 2 (two) times daily for 7 days. 09/05/19 09/12/19  Darlin Priestly, MD  budesonide (PULMICORT) 0.5 MG/2ML nebulizer solution Take 0.5 mg by nebulization daily.  01/23/18   [provider]  Calcium Carbonate-Vitamin D (CALCIUM-VITAMIN D) 500-200 MG-UNIT per tablet Take 1 tablet by mouth daily.    [provider]  ferrous sulfate 325 (65 FE) MG tablet Take 325 mg by mouth daily with breakfast.    [provider]  formoterol (PERFOROMIST) 20 MCG/2ML nebulizer solution Inhale 20 mcg into the lungs every 12 (twelve) hours. 11/30/17   [provider]  furosemide (LASIX) 40 MG tablet Take 40 mg by mouth See admin instructions. Take 80 mg in the morning and 40 mg in the evening    [provider]  ipratropium-albuterol (DUONEB) 0.5-2.5 (3) MG/3ML SOLN Inhale 3 mLs into the lungs every 8 (eight) hours as needed. 08/24/19   [provider]  potassium chloride (K-DUR) 10 MEQ tablet Take 10 mEq by mouth daily. 02/18/18   [provider]  sodium chloride 0.9 % injection 5 mLs by Intracatheter route every 12 (twelve) hours. 09/07/19 10/19/19  Henrene Dodge, MD  vitamin B-12 (CYANOCOBALAMIN) 1000 MCG tablet Take 1,000 mcg by mouth daily.    [provider]    Allergies: No Known Allergies  Review of Systems: Review of Systems  Constitutional: Negative for chills and fever.  Respiratory: Negative for shortness of breath.   Cardiovascular: Negative for chest pain.  Gastrointestinal: Negative for abdominal pain, nausea and vomiting.    Physical Exam BP 119/76   Pulse 70   Temp (!) 96.6 F (35.9 C) (Temporal)  Resp 12   Ht 5' (1.524 m)   Wt 121 lb (54.9 kg)   SpO2 96%   BMI 23.63 kg/m  CONSTITUTIONAL: No acute distress HEENT:  Normocephalic, atraumatic, extraocular motion intact. RESPIRATORY:  Lungs are clear, and breath sounds are equal bilaterally. Normal respiratory effort without pathologic use of accessory muscles. CARDIOVASCULAR: Heart is regular without murmurs, gallops, or rubs. GI: The abdomen is soft, non-distended, non-tender to palpation, with exception of some discomfort at the  drain insertion site itself.  The drain is in place, secured with suture and drain clip.  Bag has clear green bile, without purulence or debris. External gauze dressing changed.   NEUROLOGIC:  Motor and sensation is grossly normal.  Cranial nerves are grossly intact. PSYCH:  Alert and oriented to person, place and time. Affect is normal.  Labs/Imaging: None since discharge.  Assessment and Plan: This is a 86 y.o. female with acute cholecystitis s/p percutaneous cholecystostomy tube placement.  --Discussed with patient the steps that we would take moving forward with plans for eventual surgery.  First we would need to obtain a cholangiogram to evaluate the drain, gallbladder, and biliary tree.  We would also need clearance from her PCP and cardiologist given her comorbidities and Eliquis.  My goal would be to be able to offer a laparoscopic cholecystectomy, but want to make sure we're not going to harm the patient more by offering an elective surgery.  If she's not medically appropriate for surgery, the drain can stay until she's better optimized or indefinitely if she's too high risk. --Will order cholangiogram through her existing drain for next week to evaluate her gallbladder, cystic duct, and CBD.   --Follow up early April to start discussing surgery and scheduling. --In the meantime, will start getting both medical and cardiology clearance for possible laparoscopic cholecystectomy in the future.  She would need to be off Eliquis for 2 days prior to surgery.  Face-to-face time spent with the patient and care providers was 25 minutes, with more than 50% of the time spent counseling, educating, and coordinating care of the patient.     Melvyn Neth, [facility]

## 2019-09-19 NOTE — Telephone Encounter (Signed)
Medical Clearance faxed over to Dr.Ben [facility] office at [phone].  Cardiac Clearance faxed over to Dr.Prabhat Kumar office at [phone]

## 2019-09-19 NOTE — Patient Instructions (Addendum)
Dr.Piscoya informed patient that she would not have drain removed at today's visit. He discussed with patient that he would like to wait a total of 6-8 weeks before proceeding with surgery, due to the inflammation around the drain. At today's visit Dr.Piscoya discussed the different treatment options. He would like to order a study of the drain and once Dr.Piscoya has received the results from the study, he would contact the patient. Then in April, he would like to proceed with surgery, but he would like to make sure patient's other providers are ok with moving forward with surgery.  Dr.Piscoya will send a Medical and Cardiac Clearance to make sure the patient is in the best health for surgery.  Dr.Piscoya recommends patient to increase her activity and increase Protein intake to help with improving patient's strength for surgery. Patient may change the dressing daily.  Cholecystitis  Cholecystitis is inflammation of the gallbladder. It is often called a gallbladder attack. The gallbladder is a pear-shaped organ that lies beneath the liver on the right side of the body. The gallbladder stores bile, which is a fluid that helps the body digest fats. If bile builds up in your gallbladder, your gallbladder becomes inflamed. This condition may occur suddenly. Cholecystitis is a serious condition and requires treatment. What are the causes? The most common cause of this condition is gallstones. Gallstones can block the tube (duct) that carries bile out of your gallbladder. This causes bile to build up. Other causes include:  Damage to the gallbladder due to a decrease in blood flow.  Infections in the bile ducts.  Scars or kinks in the bile ducts.  Tumors in the liver, pancreas, or gallbladder. What increases the risk? You are more likely to develop this condition if:  You have sickle cell disease.  You take birth control pills or use estrogen.  You have alcoholic liver disease.  You have  liver cirrhosis.  You have your nutrition delivered through a vein (parenteral nutrition).  You are critically ill.  You do not eat or drink for a long time. This is also called "fasting."  You are obese.  You lose weight too fast.  You are pregnant.  You have high levels of fat (triglycerides) in the blood.  You have pancreatitis. What are the signs or symptoms? Symptoms of this condition include:  Pain in the abdomen, especially in the upper right area of the abdomen.  Tenderness or bloating in the abdomen.  Nausea.  Vomiting.  Fever.  Chills. How is this diagnosed? This condition is diagnosed with a medical history and physical exam. You may also have other tests, including:  Imaging tests, such as: ? An ultrasound of the gallbladder. ? A CT scan of the abdomen. ? A gallbladder nuclear scan (HIDA scan). This scan allows your health care provider to see the bile moving from your liver to your gallbladder and on to your small intestine. ? MRI.  Blood tests, such as: ? A complete blood count. The white blood cell count may be higher than normal. ? Liver function tests. Certain types of gallstones cause some results to be higher than normal. How is this treated? Treatment may include:  Surgery to remove your gallbladder (cholecystectomy).  Antibiotic medicine, usually through an IV.  Fasting for a certain amount of time.  Giving IV fluids.  Medicine to treat pain or vomiting. Follow these instructions at home:  If you had surgery, follow instructions from your health care provider about home care after the  procedure. Medicines   Take over-the-counter and prescription medicines only as told by your health care provider.  If you were prescribed an antibiotic medicine, take it as told by your health care provider. Do not stop taking the antibiotic even if you start to feel better. General instructions  Follow instructions from your health care provider  about what to eat or drink. When you are allowed to eat, avoid eating or drinking anything that triggers your symptoms.  Do not lift anything that is heavier than 10 lb (4.5 kg), or the limit that you are told, until your health care provider says that it is safe.  Do not use any products that contain nicotine or tobacco, such as cigarettes and e-cigarettes. If you need help quitting, ask your health care provider.  Keep all follow-up visits as told by your health care provider. This is important. Contact a health care provider if:  Your pain is not controlled with medicine.  You have a fever. Get help right away if:  Your pain moves to another part of your abdomen or to your back.  You continue to have symptoms or you develop new symptoms even with treatment. Summary  Cholecystitis is inflammation of the gallbladder.  The most common cause of this condition is gallstones. Gallstones can block the tube (duct) that carries bile out of your gallbladder.  Common symptoms are pain in the abdomen, nausea, vomiting, fever, and chills.  This condition is treated with surgery to remove the gallbladder, medicines, fasting, and IV fluids.  Follow your health care provider's instructions for eating and drinking. Avoid eating anything that triggers your symptoms. This information is not intended to replace advice given to you by your health care provider. Make sure you discuss any questions you have with your health care provider. Document Revised: 11/11/2017 Document Reviewed: 11/11/2017 Elsevier Patient Education  2020 [employer].

## 2019-09-19 NOTE — Telephone Encounter (Signed)
Patient is scheduled for Cholangiogram on 10/01/19 arrival time 7:45am with procedure at 8:00am at [facility].

## 2019-09-26 ENCOUNTER — Telehealth: Payer: Self-pay | Admitting: Surgery

## 2019-09-26 NOTE — Telephone Encounter (Signed)
Per Dr.Piscoya informed me to advised the patient "that's fine. i'm not worried. she's on eliquis and she may have some oozing here and there. it's not uncommon to have days that drain more and days that drain less." Patient was advised to keep an eye out if he notices his mother has fevers or the area becomes inflamed, red or hot to touch to give our office a call. Alex verbalized understanding and has no further questions.

## 2019-09-26 NOTE — Progress Notes (Signed)
Medical Clearance received from Dr Ulyses Southward. The patient is cleared for surgery at Medium Risk.

## 2019-09-26 NOTE — Telephone Encounter (Signed)
Pt's son, Trinna Post, called w/a slight concern & asked for a call back @ [phone] from DK, CMA to discuss further.  The pt currently has a drain & overnight there was no collection, however w/in the last 2 hrs he noticed an accumulation of 40 ml that included some blood.  The pt is in no pain, no distress, no fever & no alternate discharge identified.  Trinna Post is aware of active clinic & will await a returned call.  Thank you

## 2019-09-27 ENCOUNTER — Other Ambulatory Visit: Payer: Self-pay

## 2019-09-27 ENCOUNTER — Telehealth: Payer: Self-pay

## 2019-09-27 ENCOUNTER — Encounter: Payer: Self-pay | Admitting: Surgery

## 2019-09-27 ENCOUNTER — Ambulatory Visit: Payer: Medicare Other | Attending: Geriatric Medicine

## 2019-09-27 DIAGNOSIS — M6281 Muscle weakness (generalized): Secondary | ICD-10-CM | POA: Diagnosis present

## 2019-09-27 DIAGNOSIS — R262 Difficulty in walking, not elsewhere classified: Secondary | ICD-10-CM | POA: Diagnosis present

## 2019-09-27 DIAGNOSIS — M25551 Pain in right hip: Secondary | ICD-10-CM | POA: Insufficient documentation

## 2019-09-27 IMAGING — CR DG WRIST COMPLETE 3+V*L*
1 series · 4 of 4 positions shown · non-contrast
Comparison: None.

CLINICAL DATA: Left wrist a fall injury with swelling. Initial and
bruising after encounter.

EXAM:
LEFT WRIST - COMPLETE 3+ VIEW

[Series 1: dg wrist complete left · 0.14mm/px · 4 of 4 slices shown]
[im 1/4]
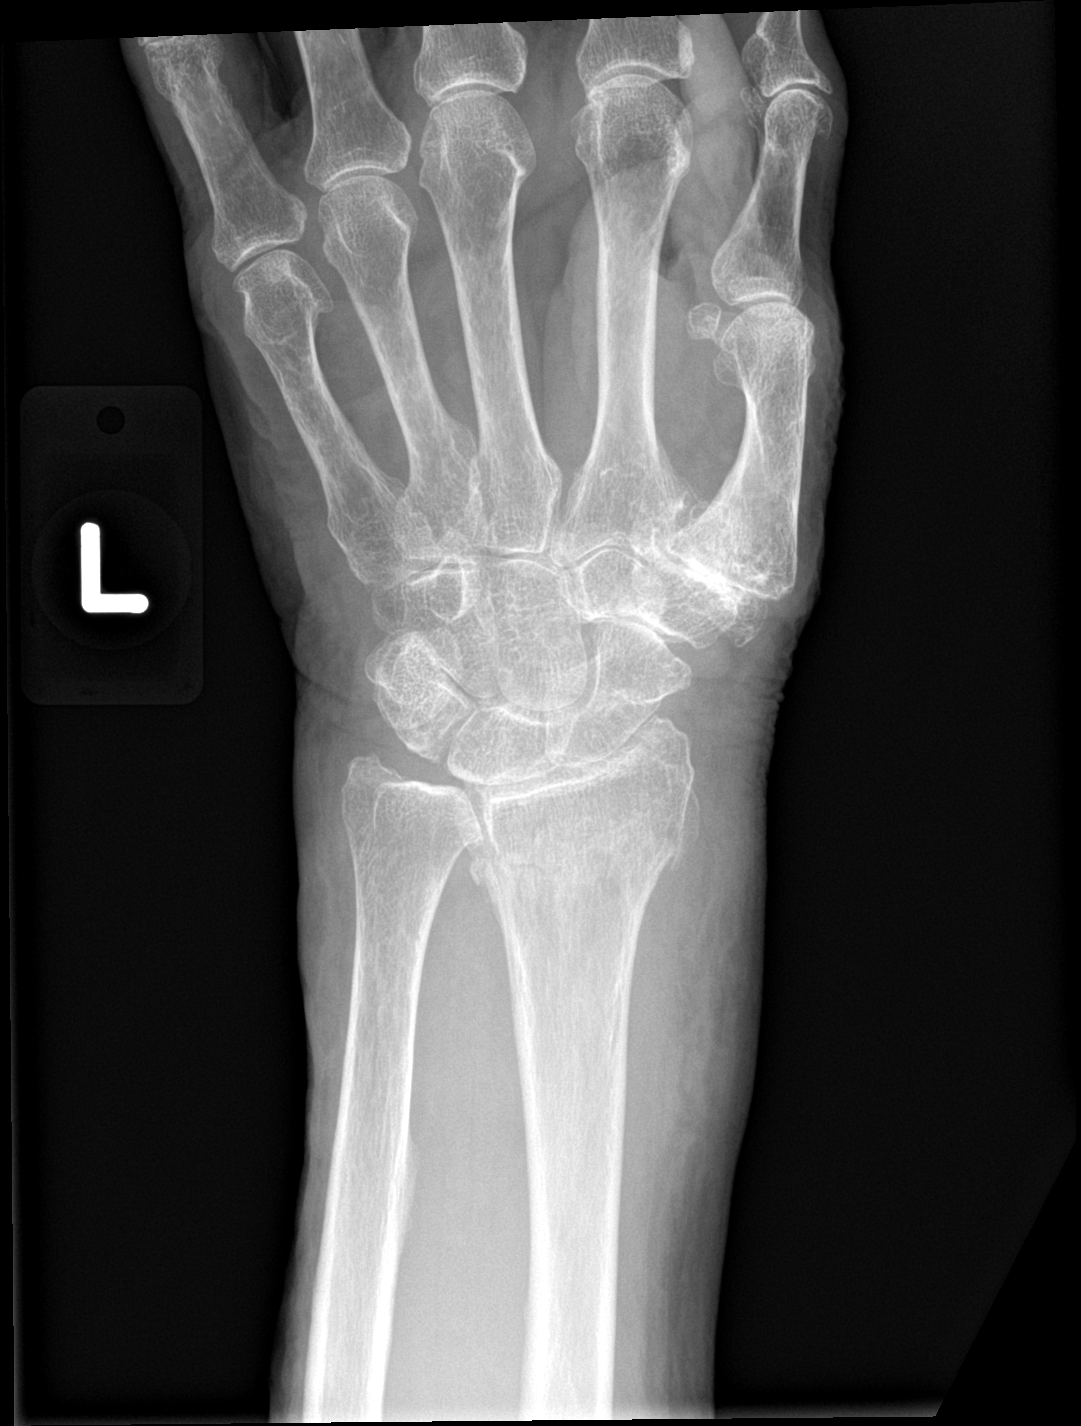
[im 2/4]
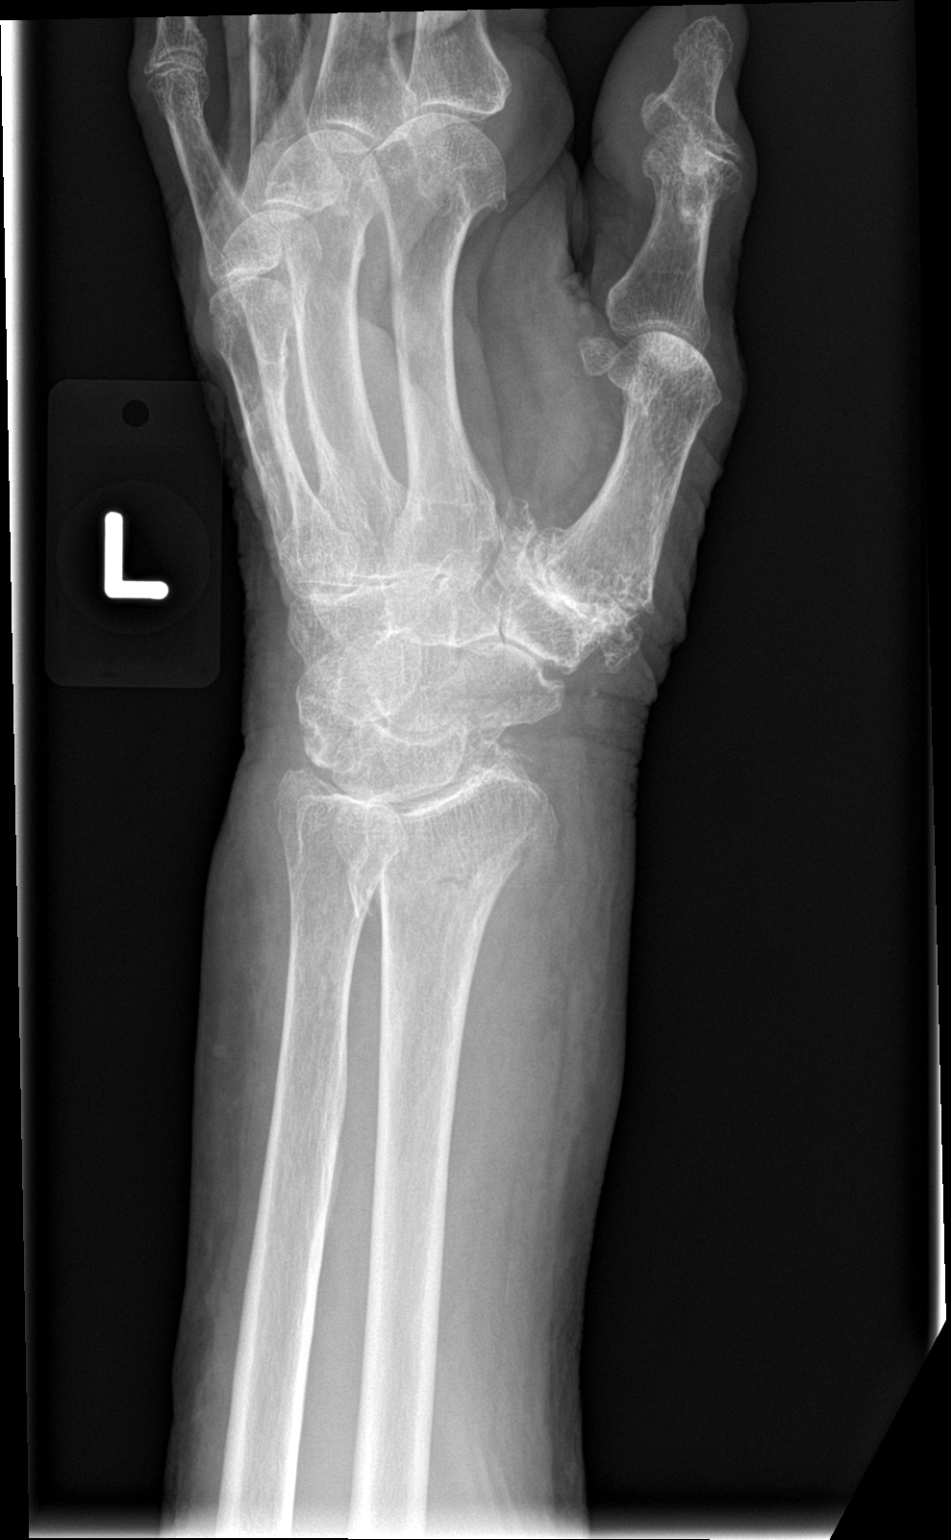
[im 3/4]
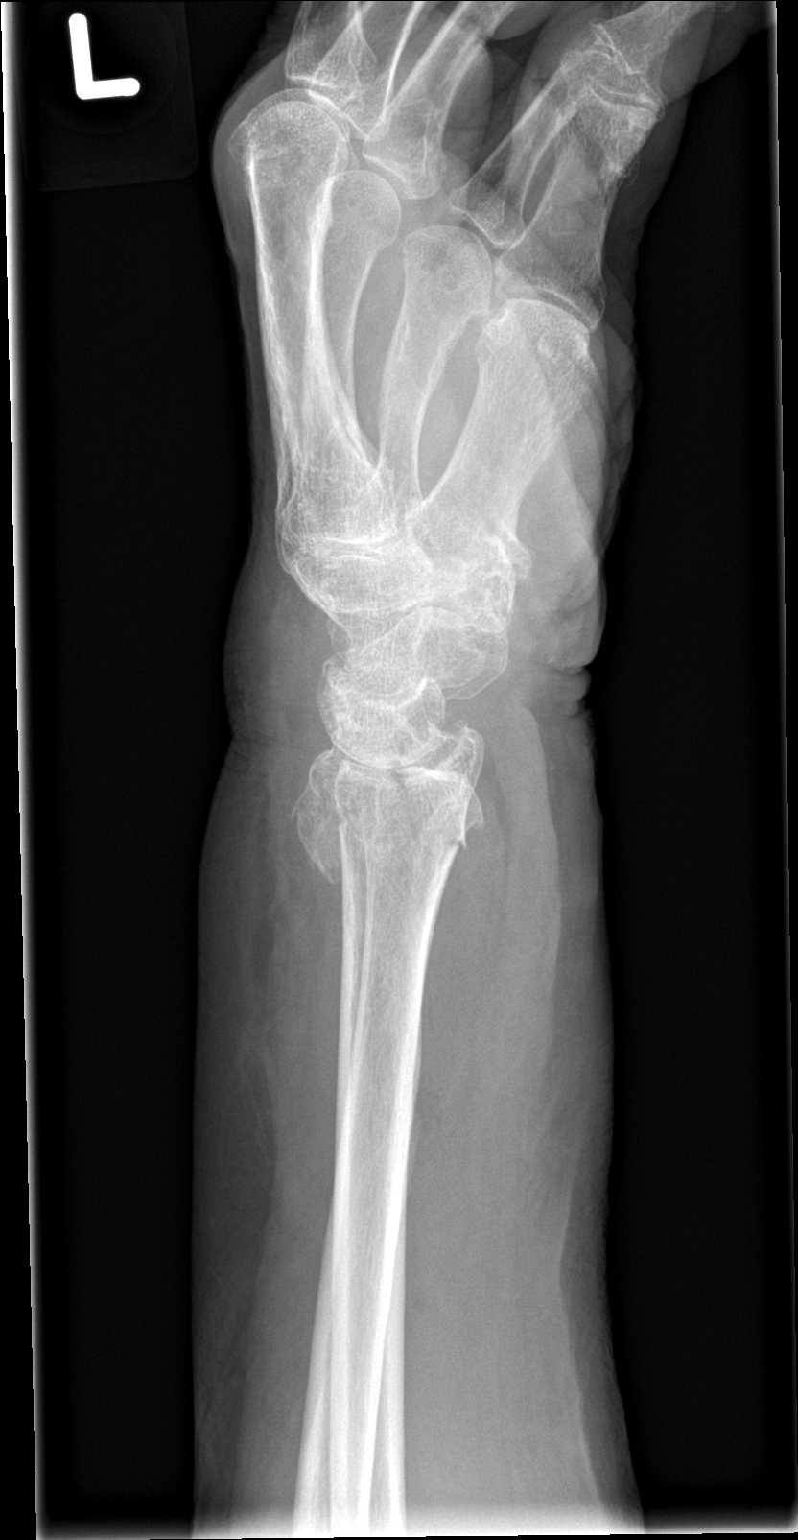
[im 4/4]
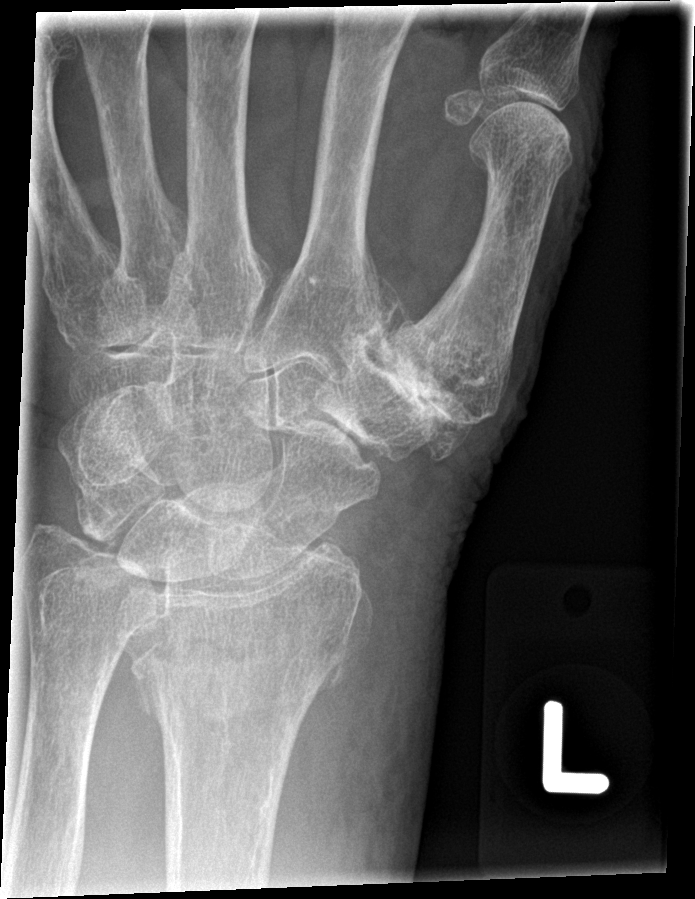

[4 of 4 positions shown; findings below may reference images not displayed]

FINDINGS: The bones appear diffusely osteopenic. There is a mildly comminuted
fracture of the distal radius which extends to the radiocarpal
articular surface. There is mild impaction, displacement, and dorsal
angulation. A nondisplaced ulnar styloid fracture is questioned.
There is diffuse soft tissue swelling about the wrist. Severe first
CMC joint osteoarthrosis is noted with advanced joint space
narrowing and bulky marginal osteophyte formation. Mild radiocarpal
osteoarthrosis is also present.
IMPRESSION: 1. Mildly comminuted distal radius fracture as above.
2. Questionable nondisplaced ulnar styloid fracture.

## 2019-09-27 NOTE — Telephone Encounter (Signed)
Received faxed stating that patient was not a patient of Dr.Prabhat Lucianne Muss. Patient was a patient of Dr.Sameer Wilford Corner, MD Cardiologist.   Faxed Cardiac Clearance to Dr.Sameer Arora's office at [phone].

## 2019-09-27 NOTE — Telephone Encounter (Signed)
Per Dr.Piscoya advised to inform patient's son that the discoloration and blood in patient's drain should NOT interfere with patient having imaging done on Monday. Patient's son made aware of recommendations and verbalized understanding and has no further questions.

## 2019-09-27 NOTE — Therapy (Signed)
[place] [facility] [address] [place], [address] Phone: [phone]   Fax:  [phone]  Physical Therapy Treatment  Patient Details  Name: Diana Powell MRN: [id number] Date of Birth: 03-12-1927 Referring Provider (PT): Augusto Garbe MD   Encounter Date: 09/27/2019  PT End of Session - 09/27/19 1650    Visit Number  19    Number of Visits  28    Date for PT Re-Evaluation  10/23/19    Authorization Type  2/10    PT Start Time  1630    PT Stop Time  1715    PT Time Calculation (min)  45 min    Equipment Utilized During Treatment  Gait belt    Activity Tolerance  Patient limited by fatigue;Patient tolerated treatment well    Behavior During Therapy  [facility] for tasks assessed/performed       Past Medical History:  Diagnosis Date  . Atrial fibrillation (HCC)   . Chronic systolic heart failure (HCC)   . COPD (chronic obstructive pulmonary disease) (HCC)   . Osteoarthritis, multiple sites   . Osteoporosis, post-menopausal   . Presence of permanent cardiac pacemaker     Past Surgical History:  Procedure Laterality Date  . CARDIOVERSION  ~2009  . HIP FRACTURE SURGERY Bilateral [phone]   each hip fractured at separate times  . INSERT / REPLACE / REMOVE PACEMAKER    . TOTAL HIP ARTHROPLASTY Right 04/20/2018   Procedure: TOTAL HIP CONVERSION;  Surgeon: Kennedy Bucker, MD;  Location: [facility] ORS;  Service: Orthopedics;  Laterality: Right;    There were no vitals filed for this visit.  Subjective Assessment - 09/27/19 1646    Subjective  Patient's son states no major changes since the previous session, states she was very tired yesterday to the point where he almost transproted her to the ED. However reports that after a nap, she felt better. No difficulties today.    Patient is accompained by:  Family member    Pertinent History  CVA with perserved EF, self-reported 'Lung condition', R THA, DVT, wrist fx     Limitations  Lifting;Standing;Walking    Patient Stated Goals  To walk around easier    Currently in Pain?  No/denies    Pain Onset  More than a month ago           TREATMENT Therapeutic Exercise: Sit to stands - x 9 with UE support from therapist Side stepping with UE support - x 20 Standing marches in standing with UE support - x 20 Square stepping with B UE support - x 20  Step ups onto airex pad - x 5 B with Unilaterall UE support Ambulation with walking - 473ft, 434ft  Performed exercises to increase LE strength and decrease fall risk.    PT Education - 09/27/19 1649    Education Details  form/technique with exercise    Person(s) Educated  Patient;Child(ren);Caregiver(s)    Methods  Explanation;Demonstration    Comprehension  Verbalized understanding;Returned demonstration          PT Long Term Goals - 09/12/19 0926      PT LONG TERM GOAL #1   Title  Patient will be independent with HEP to continue benefits of therapy until after discharge.     Baseline  Dependent with form/technique; Independent with form/technique;    Time  6    Period  Weeks    Status  Achieved      PT LONG TERM  GOAL #2   Title  Patient will be able to ambulate over 744ft ith 16minWT to improve cardiovascular endurance and improve ability to walk in the community.    Baseline  340ft; 05/22/2019: 63ft; 06/19/2019: 538ft; 08/01/2019: Deferred    Time  6    Period  Weeks    Status  On-going      PT LONG TERM GOAL #3   Title  Patient will be able to perform the TUG in under 12 sec to indicate singificant improvement gait speed and dynamic balance with use of FWW    Baseline  27sec; 05/22/2019: 16sec; 06/19/2019: 15.5sec; 08/01/2019: Deferred; 09/12/2019: 20sec    Time  6    Period  Weeks    Status  On-going      PT LONG TERM GOAL #4   Title  Patient will improve 5 x STS to under 12sec to decrease fall risk and improve LE functional strength.    Baseline  5xSTS: 27 sec; 05/22/2019: 20 sec;  06/19/2019: 20sec; 08/01/2019: Deferred; 09/12/2019; 24    Time  6    Period  Weeks    Status  On-going      PT LONG TERM GOAL #5   Title  Patient will improve gait speed to > .52m/s to indicate significant improvement with ability to community ambulate with decreased fall risk    Baseline  .37 m/s; 05/22/2019: .69 m/s; 06/19/2019:  .73 m/s; 08/01/2019: Deferred; 09/12/2019: .5 m/s    Time  6    Period  Weeks    Status  On-going            Plan - 09/27/19 1700    Clinical Impression Statement  Patient demonstrates SpO2 > 95% during the entire session; improvement in fatigue and required sitting rest breaks today versus previous sessions. Patient able to walk >463ft before requiring a sitting rest break. Conitnued to focus on improving hip strength in order to stand for longer periods of time before onset of fatigue. Patient will benefit from further skilled therapy to return to prior level of function.    Personal Factors and Comorbidities  Comorbidity 2    Comorbidities  DVT, THA, CVA, "lung disorder"    Examination-Activity Limitations  Carry;Lift;Locomotion Level;Stand;Stairs    Examination-Participation Restrictions  Cleaning;Community Activity;Meal Prep    Stability/Clinical Decision Making  Evolving/Moderate complexity    Rehab Potential  Fair    Clinical Impairments Affecting Rehab Potential  (+) highly motivated, family support (-) age, coormorbities    PT Frequency  2x / week    PT Duration  6 weeks    PT Treatment/Interventions  Electrical Stimulation;Iontophoresis 4mg /ml Dexamethasone;Aquatic Therapy;Cryotherapy;Moist Heat;Therapeutic activities;Patient/family education;Manual techniques;Balance training;Stair training;Gait training;Neuromuscular re-education;Therapeutic exercise;Passive range of motion    PT Next Visit Plan  Progress strengthening and balance    PT Home Exercise Plan  See education section    Consulted and Agree with Plan of Care  Patient       Patient  will benefit from skilled therapeutic intervention in order to improve the following deficits and impairments:  Abnormal gait, Pain, Decreased coordination, Decreased mobility, Increased muscle spasms, Postural dysfunction, Decreased endurance, Decreased range of motion, Decreased strength, Decreased balance, Difficulty walking, Decreased activity tolerance  Visit Diagnosis: Pain in right hip  Difficulty in walking, not elsewhere classified  Muscle weakness (generalized)     Problem List Patient Active Problem List   Diagnosis Date Noted  . Acute hypoxemic respiratory failure ([place])   . Acute kidney injury superimposed  on CKD (HCC)   . Acute cholecystitis 08/31/2019  . RUQ pain   . Chronic obstructive pulmonary disease (HCC)   . Acute DVT (deep venous thrombosis) (HCC) 04/29/2018  . 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 AF (paroxysmal atrial fibrillation) (HCC)   . Chronic systolic CHF (congestive heart failure) (HCC)   . Osteoporosis, post-menopausal   . Osteoarthritis, multiple sites     [name], PT DPT 09/27/2019, 5:17 PM  [place] [facility] PH[facility]ICAL AND SPORTS MEDICINE [address], [place], [address] Phone: [phone]   Fax:  [phone]  Name: Diana Powell MRN: [id number] Date of Birth: 1927-02-28

## 2019-10-01 ENCOUNTER — Other Ambulatory Visit: Payer: Self-pay

## 2019-10-01 ENCOUNTER — Ambulatory Visit
Admission: RE | Admit: 2019-10-01 | Discharge: 2019-10-01 | Disposition: A | Discharge status: Home / Self Care | Payer: Medicare Other | Source: Ambulatory Visit | Attending: Surgery | Admitting: Surgery

## 2019-10-01 ENCOUNTER — Telehealth: Payer: Self-pay

## 2019-10-01 ENCOUNTER — Other Ambulatory Visit: Payer: Self-pay | Admitting: Radiology

## 2019-10-01 ENCOUNTER — Other Ambulatory Visit: Payer: Self-pay | Admitting: Diagnostic Radiology

## 2019-10-01 DIAGNOSIS — K819 Cholecystitis, unspecified: Secondary | ICD-10-CM

## 2019-10-01 DIAGNOSIS — K810 Acute cholecystitis: Secondary | ICD-10-CM

## 2019-10-01 MED ORDER — IOHEXOL 300 MG/ML  SOLN
50.0000 mL | Freq: Once | INTRAMUSCULAR | Status: AC
  Administered 2019-10-01: 20 mL

## 2019-10-01 NOTE — Telephone Encounter (Signed)
Spoke with Cardiologist office and patient is scheduled for an appointment on 10/11/19 and informed the provider will complete Cardiac Clearance after patient's appointment.

## 2019-10-01 NOTE — Telephone Encounter (Signed)
Our office received Cardiac Clearance from Dr.Sameer Wilford Corner, MD - [facility] Cardiologist stating patient would need an appointment as she has not been seen since 07/2018. Spoke with patient's son and notified her of the information. Patient's son states she is scheduled for 10/11/19

## 2019-10-03 ENCOUNTER — Ambulatory Visit: Payer: Medicare Other

## 2019-10-04 ENCOUNTER — Ambulatory Visit: Payer: Medicare Other

## 2019-10-04 ENCOUNTER — Other Ambulatory Visit: Payer: Self-pay

## 2019-10-04 DIAGNOSIS — M6281 Muscle weakness (generalized): Secondary | ICD-10-CM

## 2019-10-04 DIAGNOSIS — R262 Difficulty in walking, not elsewhere classified: Secondary | ICD-10-CM

## 2019-10-04 DIAGNOSIS — M25551 Pain in right hip: Secondary | ICD-10-CM

## 2019-10-04 NOTE — Therapy (Signed)
[place] [facility] PH[facility]TS MEDICINE [address], [place], [address] Phone: [phone]   Fax:  [phone]  Physical Therapy Treatment  Patient Details  Name: Diana Powell MRN: [id number] Date of Birth: 19-Jun-1927 Referring Provider (PT): Augusto Garbe MD   Encounter Date: 10/04/2019  PT End of Session - 10/04/19 1407    Visit Number  20    Number of Visits  28    Date for PT Re-Evaluation  10/23/19    Authorization Type  3/10    PT Start Time  1345    PT Stop Time  1430    PT Time Calculation (min)  45 min    Equipment Utilized During Treatment  Gait belt    Activity Tolerance  Patient limited by fatigue;Patient tolerated treatment well    Behavior During Therapy  [facility] for tasks assessed/performed       Past Medical History:  Diagnosis Date  . Atrial fibrillation (HCC)   . Chronic systolic heart failure (HCC)   . COPD (chronic obstructive pulmonary disease) (HCC)   . Osteoarthritis, multiple sites   . Osteoporosis, post-menopausal   . Presence of permanent cardiac pacemaker     Past Surgical History:  Procedure Laterality Date  . CARDIOVERSION  ~2009  . HIP FRACTURE SURGERY Bilateral [phone]   each hip fractured at separate times  . INSERT / REPLACE / REMOVE PACEMAKER    . TOTAL HIP ARTHROPLASTY Right 04/20/2018   Procedure: TOTAL HIP CONVERSION;  Surgeon: Kennedy Bucker, MD;  Location: [facility] ORS;  Service: Orthopedics;  Laterality: Right;    There were no vitals filed for this visit.  Subjective Assessment - 10/04/19 1351    Subjective  Patient's son states his mother's had increased blood along the drain in her gallbladder. Patient's his mother has to go to the cardiologist before getting the surgery to remove her gallbladder.    Patient is accompained by:  Family member    Pertinent History  CVA with perserved EF, self-reported 'Lung condition', R THA, DVT, wrist fx    Limitations  Lifting;Standing;Walking     Patient Stated Goals  To walk around easier    Currently in Pain?  No/denies    Pain Onset  More than a month ago       TREATMENT Therapeutic Exercise: Sit to stands - x 6 with UE support from therapist Square stepping with B UE support - x 12 Ambulation with walking - 327ft, 424ft Side stepping with UE support - x 10 B Standing marches in standing with UE support - x 20  Performed exercises to increase LE strength and decrease fall risk.   PT Education - 10/04/19 1406    Education Details  form/technique with exercise    Person(s) Educated  Patient    Methods  Explanation;Demonstration    Comprehension  Verbalized understanding;Returned demonstration          PT Long Term Goals - 09/12/19 0926      PT LONG TERM GOAL #1   Title  Patient will be independent with HEP to continue benefits of therapy until after discharge.     Baseline  Dependent with form/technique; Independent with form/technique;    Time  6    Period  Weeks    Status  Achieved      PT LONG TERM GOAL #2   Title  Patient will be able to ambulate over 737ft ith to improve cardiovascular endurance and improve ability to walk in  the community.    Baseline  352ft; 05/22/2019: 61ft; 06/19/2019: 527ft; 08/01/2019: Deferred    Time  6    Period  Weeks    Status  On-going      PT LONG TERM GOAL #3   Title  Patient will be able to perform the TUG in under 12 sec to indicate singificant improvement gait speed and dynamic balance with use of FWW    Baseline  27sec; 05/22/2019: 16sec; 06/19/2019: 15.5sec; 08/01/2019: Deferred; 09/12/2019: 20sec    Time  6    Period  Weeks    Status  On-going      PT LONG TERM GOAL #4   Title  Patient will improve 5 x STS to under 12sec to decrease fall risk and improve LE functional strength.    Baseline  5xSTS: 27 sec; 05/22/2019: 20 sec; 06/19/2019: 20sec; 08/01/2019: Deferred; 09/12/2019; 24    Time  6    Period  Weeks    Status  On-going      PT LONG TERM GOAL #5    Title  Patient will improve gait speed to > .62m/s to indicate significant improvement with ability to community ambulate with decreased fall risk    Baseline  .37 m/s; 05/22/2019: .69 m/s; 06/19/2019:  .73 m/s; 08/01/2019: Deferred; 09/12/2019: .5 m/s    Time  6    Period  Weeks    Status  On-going            Plan - 10/04/19 1415    Clinical Impression Statement  Patient's vitals stay within WNL during the session however she demonstrates increased fatigue with walking and performing standing exercises. Willl continue to focus on improving strength with stanindg based movements, but patient is having increased difficulty with walking for prolonged periods of time. Patient will benefit from further skilled therapy to return to prior level of function.    Personal Factors and Comorbidities  Comorbidity 2    Comorbidities  DVT, THA, CVA, "lung disorder"    Examination-Activity Limitations  Carry;Lift;Locomotion Level;Stand;Stairs    Examination-Participation Restrictions  Cleaning;Community Activity;Meal Prep    Stability/Clinical Decision Making  Evolving/Moderate complexity    Rehab Potential  Fair    Clinical Impairments Affecting Rehab Potential  (+) highly motivated, family support (-) age, coormorbities    PT Frequency  2x / week    PT Duration  6 weeks    PT Treatment/Interventions  Electrical Stimulation;Iontophoresis 4mg /ml Dexamethasone;Aquatic Therapy;Cryotherapy;Moist Heat;Therapeutic activities;Patient/family education;Manual techniques;Balance training;Stair training;Gait training;Neuromuscular re-education;Therapeutic exercise;Passive range of motion    PT Next Visit Plan  Progress strengthening and balance    PT Home Exercise Plan  See education section    Consulted and Agree with Plan of Care  Patient       Patient will benefit from skilled therapeutic intervention in order to improve the following deficits and impairments:  Abnormal gait, Pain, Decreased coordination,  Decreased mobility, Increased muscle spasms, Postural dysfunction, Decreased endurance, Decreased range of motion, Decreased strength, Decreased balance, Difficulty walking, Decreased activity tolerance  Visit Diagnosis: Pain in right hip  Difficulty in walking, not elsewhere classified  Muscle weakness (generalized)     Problem List Patient Active Problem List   Diagnosis Date Noted  . Acute hypoxemic respiratory failure (HCC)   . Acute kidney injury superimposed on CKD (HCC)   . Acute cholecystitis 08/31/2019  . RUQ pain   . Chronic obstructive pulmonary disease (HCC)   . Acute DVT (deep venous thrombosis) (HCC) 04/29/2018  . Status post  total hip replacement, right 04/20/2018  . Dementia due to medical condition without behavioral disturbance ([place]) 05/04/2013  . AF (paroxysmal atrial fibrillation) ([place])   . Chronic systolic CHF (congestive heart failure) ([place])   . Osteoporosis, post-menopausal   . Osteoarthritis, multiple sites     Blythe Stanford, PT DPT 10/04/2019, 2:31 PM  [place] [facility]IC[facility] [address] [place], [address] Phone: [phone]   Fax:  [phone]  Name: Diana Powell MRN: [id number] Date of Birth: May 19, 1927

## 2019-10-05 ENCOUNTER — Ambulatory Visit
Admission: RE | Admit: 2019-10-05 | Discharge: 2019-10-05 | Disposition: A | Discharge status: Home / Self Care | Payer: Medicare Other | Source: Ambulatory Visit | Attending: Radiology | Admitting: Radiology

## 2019-10-05 DIAGNOSIS — K819 Cholecystitis, unspecified: Secondary | ICD-10-CM | POA: Diagnosis not present

## 2019-10-05 DIAGNOSIS — Z4803 Encounter for change or removal of drains: Secondary | ICD-10-CM | POA: Diagnosis present

## 2019-10-05 HISTORY — PX: IR EXCHANGE BILIARY DRAIN: IMG6046

## 2019-10-05 MED ORDER — IODIXANOL 320 MG/ML IV SOLN
50.0000 mL | Freq: Once | INTRAVENOUS | Status: AC | PRN
  Administered 2019-10-05: 5 mL

## 2019-10-05 NOTE — Discharge Instructions (Signed)
Cholecystostomy Cholecystostomy is a procedure to drain fluid from the gallbladder by using a flexible drainage tube (catheter). The gallbladder is a pear-shaped organ that lies beneath the liver on the right side of the body. The gallbladder stores bile, which is a fluid that helps the body digest fats. You may have this procedure:  If your gallbladder is infected due to gallstones (cholecystitis).  To control a gallbladder infection if you cannot have gallbladder surgery. Tell a health care provider about:  Any allergies you have.  All medicines you are taking, including vitamins, herbs, eye drops, creams, and over-the-counter medicines.  Any problems you or family members have had with anesthetic medicines.  Any blood disorders you have.  Any surgeries you have had.  Any medical conditions you have.  Whether you are pregnant or may be pregnant. What are the risks? Generally, this is a safe procedure. However, problems may occur, including:  The catheter moving out of place.  Clogging of the catheter.  Infection of the incision site.  Internal bleeding.  Leakage of bile from the gallbladder.  Infection inside the abdomen (peritonitis).  Damage to other structures or organs.  Low blood pressure and slowed heart rate.  Allergic reactions to medicines or dyes. What happens before the procedure? Most often, you will already be in the hospital receiving treatment for a gallbladder infection or other problems with the gallbladder. Staying hydrated Follow instructions from your health care provider about hydration, which may include:  Up to 2 hours before the procedure - you may continue to drink clear liquids, such as water, clear fruit juice, black coffee, and plain tea.  Eating and drinking Follow instructions from your health care provider about eating and drinking, which may include:  8 hours before the procedure - stop eating heavy meals or foods, such as meat,  fried foods, or fatty foods.  6 hours before the procedure - stop eating light meals or foods, such as toast or cereal.  6 hours before the procedure - stop drinking milk or drinks that contain milk.  2 hours before the procedure - stop drinking clear liquids. Medicines  Ask your health care provider about: ? Changing or stopping your regular medicines. This is especially important if you are taking diabetes medicines or blood thinners. ? Taking medicines such as aspirin and ibuprofen. These medicines can thin your blood. Do not take these medicines unless your health care provider tells you to take them. ? Taking over-the-counter medicines, vitamins, herbs, and supplements. General instructions  You may have an exam or testing, including: ? Imaging studies of your gallbladder. ? Blood tests.  Do not use any products that contain nicotine or tobacco before the procedure. These products include cigarettes, e-cigarettes, and chewing tobacco. If you need help quitting, ask your health care provider. Also, do not use these products after the procedure.  Plan to have someone take you home from the hospital or clinic.  If you will be going home right after the procedure, plan to have someone with you for 24 hours.  Ask your health care provider how your surgical site will be marked or identified.  Ask your health care provider what steps will be taken to help prevent infection. These may include: ? Removing hair at the surgery site. ? Washing skin with a germ-killing soap. ? Taking antibiotic medicine. What happens during the procedure?  An IV will be inserted into one of your veins.  You will be given one or more of the   following: ? A medicine to help you relax (sedative). ? A medicine to numb the area (local anesthetic).  A small incision will be made in your abdomen.  A long needle or a wide puncturing tool (trocar) will be put through the incision.  Your health care provider  will use an imaging study (ultrasound) to guide the needle or trocar into your gallbladder.  After the needle or trocar is in your gallbladder, a small amount of dye may be injected. An X-ray may be taken to make sure that the needle is in the correct place.  A catheter will be placed through the needle.  The needle or trocar will be removed.  The catheter will be secured to your skin with stitches (sutures).  The catheter will be connected to a drainage bag. Fluid will drain from the gallbladder into the bag. Some of this bile may be sent to the lab to be tested.  A bandage (dressing) will be placed over the incision site where the catheter was placed. The procedure may vary among health care providers and hospitals. What happens after the procedure?  Your blood pressure, heart rate, breathing rate, and blood oxygen level will be monitored until you leave the hospital or clinic.  Dye may be injected through your catheter to check the catheter and your gallbladder.  Your gallbladder may be flushed out (irrigated) through the catheter.  Your catheter and drainage bag may need to stay in place for several weeks or as told by your health care provider. Summary  Cholecystostomy is a procedure to drain fluid from the gallbladder using a flexible drainage tube (catheter).  Generally, this is a safe procedure. However, problems may occur, including bleeding, infection, clogging of the catheter, damage to other structures or organs, or low blood pressure and slowed heart rate.  Follow instructions before the procedure. You will be told when to stop all food and drink, whether to change or stop any medicines, and what tests need to be done.  After the procedure, you will be monitored in the hospital, and you may have a catheter and drainage bag when you go home. This information is not intended to replace advice given to you by your health care provider. Make sure you discuss any questions you  have with your health care provider. Document Revised: 01/30/2018 Document Reviewed: 01/30/2018 Elsevier Patient Education  2020 [employer]  

## 2019-10-05 NOTE — Procedures (Signed)
Interventional Radiology Procedure:   Indications: Cholecystostomy tube needs routine exchange  Procedure: Cholecystostomy tube exchange  Findings: Large gallstone.  New 10 Fr drain in gallbladder  Complications: None     EBL: None  Plan: Plan for routine exchange in 8 weeks unless surgery first.     Rachelle Hora. Lowella Dandy, MD  Pager: [phone]

## 2019-10-09 ENCOUNTER — Ambulatory Visit: Payer: Medicare Other

## 2019-10-09 ENCOUNTER — Other Ambulatory Visit: Payer: Self-pay

## 2019-10-09 DIAGNOSIS — M25551 Pain in right hip: Secondary | ICD-10-CM | POA: Diagnosis not present

## 2019-10-09 DIAGNOSIS — R262 Difficulty in walking, not elsewhere classified: Secondary | ICD-10-CM

## 2019-10-09 NOTE — Therapy (Signed)
[facility] [address], [place], [address] Phone: [phone]   Fax:  [phone]  Physical Therapy Treatment  Patient Details  Name: Diana Powell MRN: [id number] Date of Birth: [birth year] Referring Provider (PT): Frazier Butt MD   Encounter Date: 10/09/2019  PT End of Session - 10/09/19 1403    Visit Number  21    Number of Visits  28    Date for PT Re-Evaluation  10/23/19    Authorization Type  4/10    PT Start Time  2297    PT Stop Time  1430    PT Time Calculation (min)  45 min    Equipment Utilized During Treatment  Gait belt    Activity Tolerance  Patient limited by fatigue;Patient tolerated treatment well    Behavior During Therapy  [facility] for tasks assessed/performed       Past Medical History:  Diagnosis Date  . Atrial fibrillation ([place])   . Chronic systolic heart failure ([place])   . COPD (chronic obstructive pulmonary disease) ([place])   . Osteoarthritis, multiple sites   . Osteoporosis, post-menopausal   . Presence of permanent cardiac pacemaker     Past Surgical History:  Procedure Laterality Date  . CARDIOVERSION  ~2009  . HIP FRACTURE SURGERY Bilateral [phone]   each hip fractured at separate times  . INSERT / REPLACE / REMOVE PACEMAKER    . IR EXCHANGE BILIARY DRAIN  10/05/2019  . TOTAL HIP ARTHROPLASTY Right 04/20/2018   Procedure: TOTAL HIP CONVERSION;  Surgeon: Hessie Knows, MD;  Location: [facility] ORS;  Service: Orthopedics;  Laterality: Right;    There were no vitals filed for this visit.  Subjective Assessment - 10/09/19 1351    Subjective  Patient's son reports she has been feeling better over the past couple of days. Patient's son states no major changes otherwise.    Patient is accompained by:  Family member    Pertinent History  CVA with perserved EF, self-reported 'Lung condition', R THA, DVT, wrist fx    Limitations  Lifting;Standing;Walking    Patient Stated Goals  To walk  around easier    Currently in Pain?  No/denies    Pain Onset  More than a month ago         TREATMENT Therapeutic Exercise: Side stepping with UE support - x 10 B Ambulation with walking - 453ft, 4110ft Standing marches in standing with UE support - x 10 B Square stepping with B UE support - x 12 Forward stepping/backward stepping with UE support - x 10 B Circle walking - 2 x 3 round ccw   Performed exercises to increase LE strength and decrease fall risk.    PT Education - 10/09/19 1359    Education Details  form/technique with exercise    Person(s) Educated  Patient;Caregiver(s)    Methods  Explanation;Demonstration    Comprehension  Verbalized understanding;Returned demonstration          PT Long Term Goals - 09/12/19 0926      PT LONG TERM GOAL #1   Title  Patient will be independent with HEP to continue benefits of therapy until after discharge.     Baseline  Dependent with form/technique; Independent with form/technique;    Time  6    Period  Weeks    Status  Achieved      PT LONG TERM GOAL #2   Title  Patient will be able to ambulate over 712ft ith 68minWT  to improve cardiovascular endurance and improve ability to walk in the community.    Baseline  366ft; 05/22/2019: 668ft; 06/19/2019: 546ft; 08/01/2019: Deferred    Time  6    Period  Weeks    Status  On-going      PT LONG TERM GOAL #3   Title  Patient will be able to perform the TUG in under 12 sec to indicate singificant improvement gait speed and dynamic balance with use of FWW    Baseline  27sec; 05/22/2019: 16sec; 06/19/2019: 15.5sec; 08/01/2019: Deferred; 09/12/2019: 20sec    Time  6    Period  Weeks    Status  On-going      PT LONG TERM GOAL #4   Title  Patient will improve 5 x STS to under 12sec to decrease fall risk and improve LE functional strength.    Baseline  5xSTS: 27 sec; 05/22/2019: 20 sec; 06/19/2019: 20sec; 08/01/2019: Deferred; 09/12/2019; 24    Time  6    Period  Weeks    Status   On-going      PT LONG TERM GOAL #5   Title  Patient will improve gait speed to > .70m/s to indicate significant improvement with ability to community ambulate with decreased fall risk    Baseline  .37 m/s; 05/22/2019: .69 m/s; 06/19/2019:  .73 m/s; 08/01/2019: Deferred; 09/12/2019: .5 m/s    Time  6    Period  Weeks    Status  On-going            Plan - 10/09/19 1404    Clinical Impression Statement  Patient demonstrates improvement with walking compared to the preivous session, however demonstrates decreased walking tolerance compared to returning to the hospital secondary to gallbladder complications. Will continue to challenge walking and standing LE strength. Patient will benefit from further skilled therapy to return to prior level of function.    Personal Factors and Comorbidities  Comorbidity 2    Comorbidities  DVT, THA, CVA, "lung disorder"    Examination-Activity Limitations  Carry;Lift;Locomotion Level;Stand;Stairs    Examination-Participation Restrictions  Cleaning;Community Activity;Meal Prep    Stability/Clinical Decision Making  Evolving/Moderate complexity    Rehab Potential  Fair    Clinical Impairments Affecting Rehab Potential  (+) highly motivated, family support (-) age, coormorbities    PT Frequency  2x / week    PT Duration  6 weeks    PT Treatment/Interventions  Electrical Stimulation;Iontophoresis 4mg /ml Dexamethasone;Aquatic Therapy;Cryotherapy;Moist Heat;Therapeutic activities;Patient/family education;Manual techniques;Balance training;Stair training;Gait training;Neuromuscular re-education;Therapeutic exercise;Passive range of motion    PT Next Visit Plan  Progress strengthening and balance    PT Home Exercise Plan  See education section    Consulted and Agree with Plan of Care  Patient       Patient will benefit from skilled therapeutic intervention in order to improve the following deficits and impairments:  Abnormal gait, Pain, Decreased coordination,  Decreased mobility, Increased muscle spasms, Postural dysfunction, Decreased endurance, Decreased range of motion, Decreased strength, Decreased balance, Difficulty walking, Decreased activity tolerance  Visit Diagnosis: Pain in right hip  Difficulty in walking, not elsewhere classified     Problem List Patient Active Problem List   Diagnosis Date Noted  . Acute hypoxemic respiratory failure (HCC)   . Acute kidney injury superimposed on CKD (HCC)   . Acute cholecystitis 08/31/2019  . RUQ pain   . Chronic obstructive pulmonary disease (HCC)   . Acute DVT (deep venous thrombosis) (HCC) 04/29/2018  . Status post total hip replacement,  right 04/20/2018  . 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 AF (paroxysmal atrial fibrillation) (HCC)   . Chronic systolic CHF (congestive heart failure) (HCC)   . Osteoporosis, post-menopausal   . Osteoarthritis, multiple sites     [name], PT DPT 10/09/2019, 2:31 PM  [facility] [address], [place], [address] Phone: [phone]   Fax:  [phone]  Name: Phoenix Riesen MRN: [id number] Date of Birth: 01-04-1927

## 2019-10-16 ENCOUNTER — Ambulatory Visit: Payer: Medicare Other

## 2019-10-16 ENCOUNTER — Other Ambulatory Visit: Payer: Self-pay

## 2019-10-16 ENCOUNTER — Encounter: Payer: Self-pay | Admitting: Surgery

## 2019-10-16 DIAGNOSIS — R262 Difficulty in walking, not elsewhere classified: Secondary | ICD-10-CM

## 2019-10-16 DIAGNOSIS — M6281 Muscle weakness (generalized): Secondary | ICD-10-CM

## 2019-10-16 DIAGNOSIS — M25551 Pain in right hip: Secondary | ICD-10-CM

## 2019-10-17 NOTE — Therapy (Signed)
[facility] [address] [place], [address] Phone: [phone]   Fax:  [phone]  Physical Therapy Treatment  Patient Details  Name: Diana Powell MRN: [id number] Date of Birth: Sep 24, 1926 Referring Provider (PT): Augusto Garbe MD   Encounter Date: 10/16/2019  PT End of Session - 10/16/19 1541    Visit Number  22    Number of Visits  28    Date for PT Re-Evaluation  10/23/19    Authorization Type  5/10    PT Start Time  1515    PT Stop Time  1600    PT Time Calculation (min)  45 min    Equipment Utilized During Treatment  Gait belt    Activity Tolerance  Patient limited by fatigue;Patient tolerated treatment well    Behavior During Therapy  [facility] for tasks assessed/performed       Past Medical History:  Diagnosis Date  . Atrial fibrillation (HCC)   . Chronic systolic heart failure (HCC)   . COPD (chronic obstructive pulmonary disease) (H[facility])   . Osteoarthritis, multiple sites   . Osteoporosis, post-menopausal   . Presence of permanent cardiac pacemaker     Past Surgical History:  Procedure Laterality Date  . CARDIOVERSION  ~2009  . HIP FRACTURE SURGERY Bilateral [phone]   each hip fractured at separate times  . INSERT / REPLACE / REMOVE PACEMAKER    . IR EXCHANGE BILIARY DRAIN  10/05/2019  . TOTAL HIP ARTHROPLASTY Right 04/20/2018   Procedure: TOTAL HIP CONVERSION;  Surgeon: Kennedy Bucker, MD;  Location: [facility] ORS;  Service: Orthopedics;  Laterality: Right;    There were no vitals filed for this visit.  Subjective Assessment - 10/16/19 1529    Subjective  Patient's son states she went to the cardiologist who states she would be prepared for the gallbladder surgery.    Patient is accompained by:  Family member    Pertinent History  CVA with perserved EF, self-reported 'Lung condition', R THA, DVT, wrist fx    Limitations  Lifting;Standing;Walking    Patient Stated Goals  To walk around easier     Currently in Pain?  No/denies    Pain Onset  More than a month ago            TREATMENT Therapeutic Exercise: Side stepping with UE support - x 66ft B Ambulation with walking - 447ft, 575ft Standing marches in standing with UE support - x 10 B Square stepping with B UE support - x 12 Diagonal stepping in standing - x 15  Circle walking - 2 x 10 round ccw Lateral cone taps in standing with therapist support - x 7 cone taps  Performed exercises to increase LE strength and decrease fall risk.      PT Education - 10/16/19 1535    Education Details  form/technique with exercise    Person(s) Educated  Patient;Caregiver(s)    Methods  Explanation;Demonstration    Comprehension  Returned demonstration;Verbalized understanding          PT Long Term Goals - 09/12/19 0926      PT LONG TERM GOAL #1   Title  Patient will be independent with HEP to continue benefits of therapy until after discharge.     Baseline  Dependent with form/technique; Independent with form/technique;    Time  6    Period  Weeks    Status  Achieved      PT LONG TERM GOAL #2  Title  Patient will be able to ambulate over 750ft ith 74minWT to improve cardiovascular endurance and improve ability to walk in the community.    Baseline  356ft; 05/22/2019: 67ft; 06/19/2019: 57ft; 08/01/2019: Deferred    Time  6    Period  Weeks    Status  On-going      PT LONG TERM GOAL #3   Title  Patient will be able to perform the TUG in under 12 sec to indicate singificant improvement gait speed and dynamic balance with use of FWW    Baseline  27sec; 05/22/2019: 16sec; 06/19/2019: 15.5sec; 08/01/2019: Deferred; 09/12/2019: 20sec    Time  6    Period  Weeks    Status  On-going      PT LONG TERM GOAL #4   Title  Patient will improve 5 x STS to under 12sec to decrease fall risk and improve LE functional strength.    Baseline  5xSTS: 27 sec; 05/22/2019: 20 sec; 06/19/2019: 20sec; 08/01/2019: Deferred; 09/12/2019; 24     Time  6    Period  Weeks    Status  On-going      PT LONG TERM GOAL #5   Title  Patient will improve gait speed to > .17m/s to indicate significant improvement with ability to community ambulate with decreased fall risk    Baseline  .37 m/s; 05/22/2019: .69 m/s; 06/19/2019:  .73 m/s; 08/01/2019: Deferred; 09/12/2019: .5 m/s    Time  6    Period  Weeks    Status  On-going            Plan - 10/17/19 1122    Clinical Impression Statement  Continued to focus on improving walking and standing tolerance today. Patient continued to require frequent sititng rest breaks, however she demonstrates improvement with ability to perform exercises for longer periods of time before requiring a sitting rest break. Patient able to walk 590ft consecutively which she has not been able to perform for 1 month. Patient will benefit from furhter skilled therapy focused on improving limitations to return to prior level of function.    Personal Factors and Comorbidities  Comorbidity 2    Comorbidities  DVT, THA, CVA, "lung disorder"    Examination-Activity Limitations  Carry;Lift;Locomotion Level;Stand;Stairs    Examination-Participation Restrictions  Cleaning;Community Activity;Meal Prep    Stability/Clinical Decision Making  Evolving/Moderate complexity    Rehab Potential  Fair    Clinical Impairments Affecting Rehab Potential  (+) highly motivated, family support (-) age, coormorbities    PT Frequency  2x / week    PT Duration  6 weeks    PT Treatment/Interventions  Electrical Stimulation;Iontophoresis 4mg /ml Dexamethasone;Aquatic Therapy;Cryotherapy;Moist Heat;Therapeutic activities;Patient/family education;Manual techniques;Balance training;Stair training;Gait training;Neuromuscular re-education;Therapeutic exercise;Passive range of motion    PT Next Visit Plan  Progress strengthening and balance    PT Home Exercise Plan  See education section    Consulted and Agree with Plan of Care  Patient       Patient  will benefit from skilled therapeutic intervention in order to improve the following deficits and impairments:  Abnormal gait, Pain, Decreased coordination, Decreased mobility, Increased muscle spasms, Postural dysfunction, Decreased endurance, Decreased range of motion, Decreased strength, Decreased balance, Difficulty walking, Decreased activity tolerance  Visit Diagnosis: Difficulty in walking, not elsewhere classified  Pain in right hip  Muscle weakness (generalized)     Problem List Patient Active Problem List   Diagnosis Date Noted  . Acute hypoxemic respiratory failure ([place])   . Acute  kidney injury superimposed on CKD (HCC)   . Acute cholecystitis 08/31/2019  . RUQ pain   . Chronic obstructive pulmonary disease (HCC)   . Acute DVT (deep venous thrombosis) (HCC) 04/29/2018  . 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 AF (paroxysmal atrial fibrillation) (HCC)   . Chronic systolic CHF (congestive heart failure) (HCC)   . Osteoporosis, post-menopausal   . Osteoarthritis, multiple sites     [name] 10/17/2019, 11:31 AM  [facility] [address], [place], [address] Phone: [phone]   Fax:  [phone]  Name: Diana Powell MRN: [id number] Date of Birth: 04/11/1927

## 2019-10-22 ENCOUNTER — Ambulatory Visit: Payer: Medicare Other | Admitting: Surgery

## 2019-10-22 ENCOUNTER — Other Ambulatory Visit: Payer: Self-pay

## 2019-10-22 ENCOUNTER — Ambulatory Visit: Payer: Medicare Other | Attending: Geriatric Medicine

## 2019-10-22 DIAGNOSIS — M25551 Pain in right hip: Secondary | ICD-10-CM | POA: Diagnosis present

## 2019-10-22 DIAGNOSIS — R262 Difficulty in walking, not elsewhere classified: Secondary | ICD-10-CM | POA: Diagnosis present

## 2019-10-22 DIAGNOSIS — M6281 Muscle weakness (generalized): Secondary | ICD-10-CM | POA: Diagnosis present

## 2019-10-23 NOTE — Therapy (Signed)
[facility] [address] [place], [address] Phone: [phone]   Fax:  [phone]  Physical Therapy Treatment  Patient Details  Name: Diana Powell MRN: [id number] Date of Birth: 1926-11-16 Referring Provider (PT): Augusto Garbe MD   Encounter Date: 10/22/2019  PT End of Session - 10/23/19 0800    Visit Number  23    Number of Visits  28    Date for PT Re-Evaluation  10/23/19    Authorization Type  6/10    PT Start Time  1515    PT Stop Time  1600    PT Time Calculation (min)  45 min    Equipment Utilized During Treatment  Gait belt    Activity Tolerance  Patient limited by fatigue;Patient tolerated treatment well    Behavior During Therapy  [facility] for tasks assessed/performed       Past Medical History:  Diagnosis Date  . Atrial fibrillation (HCC)   . Chronic systolic heart failure (HCC)   . COPD (chronic obstructive pulmonary disease) (HCC)   . Osteoarthritis, multiple sites   . Osteoporosis, post-menopausal   . Presence of permanent cardiac pacemaker     Past Surgical History:  Procedure Laterality Date  . CARDIOVERSION  ~2009  . HIP FRACTURE SURGERY Bilateral [phone]   each hip fractured at separate times  . INSERT / REPLACE / REMOVE PACEMAKER    . IR EXCHANGE BILIARY DRAIN  10/05/2019  . TOTAL HIP ARTHROPLASTY Right 04/20/2018   Procedure: TOTAL HIP CONVERSION;  Surgeon: Kennedy Bucker, MD;  Location: [facility] ORS;  Service: Orthopedics;  Laterality: Right;    There were no vitals filed for this visit.  Subjective Assessment - 10/22/19 1532    Subjective  Patient's son states she was fatigued over the weekend with limited her abiity to perform exercises. No changes otherwises.    Patient is accompained by:  Family member    Pertinent History  CVA with perserved EF, self-reported 'Lung condition', R THA, DVT, wrist fx    Limitations  Lifting;Standing;Walking    Patient Stated Goals  To walk around  easier    Currently in Pain?  No/denies    Pain Onset  More than a month ago        TREATMENT Therapeutic Exercise: Side stepping with UE support - x 64ft B Ambulation with walking - 430ft, 521ft Square stepping with B UE support - x 12 Standing marches in standing with UE support - x 10 B with focus on speed Spinning in standing - x 2 cw/ccw - stopped secondary to dizziness Hip abduction in standing with UE support - x 11 B Soccer kicks in standing - x 15 B  Performed exercises to increase LE strength and decrease fall risk.   PT Education - 10/22/19 1537    Education Details  form/technique with exercise    Person(s) Educated  Patient    Methods  Explanation;Demonstration    Comprehension  Verbalized understanding;Returned demonstration          PT Long Term Goals - 09/12/19 0926      PT LONG TERM GOAL #1   Title  Patient will be independent with HEP to continue benefits of therapy until after discharge.     Baseline  Dependent with form/technique; Independent with form/technique;    Time  6    Period  Weeks    Status  Achieved      PT LONG TERM GOAL #2  Title  Patient will be able to ambulate over 773ft ith 38minWT to improve cardiovascular endurance and improve ability to walk in the community.    Baseline  354ft; 05/22/2019: 631ft; 06/19/2019: 570ft; 08/01/2019: Deferred    Time  6    Period  Weeks    Status  On-going      PT LONG TERM GOAL #3   Title  Patient will be able to perform the TUG in under 12 sec to indicate singificant improvement gait speed and dynamic balance with use of FWW    Baseline  27sec; 05/22/2019: 16sec; 06/19/2019: 15.5sec; 08/01/2019: Deferred; 09/12/2019: 20sec    Time  6    Period  Weeks    Status  On-going      PT LONG TERM GOAL #4   Title  Patient will improve 5 x STS to under 12sec to decrease fall risk and improve LE functional strength.    Baseline  5xSTS: 27 sec; 05/22/2019: 20 sec; 06/19/2019: 20sec; 08/01/2019: Deferred;  09/12/2019; 24    Time  6    Period  Weeks    Status  On-going      PT LONG TERM GOAL #5   Title  Patient will improve gait speed to > .48m/s to indicate significant improvement with ability to community ambulate with decreased fall risk    Baseline  .37 m/s; 05/22/2019: .69 m/s; 06/19/2019:  .73 m/s; 08/01/2019: Deferred; 09/12/2019: .5 m/s    Time  6    Period  Weeks    Status  On-going            Plan - 10/23/19 0800    Clinical Impression Statement  Continued to address balance, walking, and standing limitations during today's session. Patient with increased difficulty with performing prolonged standing and walking during today's session, requiring sitting rest breaks after ~467ft vs 553ft compared to the previous session. Also continued to address single leg positions with dynamic movements to address power and recovery from LOB. Patient does well with this, however requires UE support to perform. Patient will benefit from further skilled therapy focused on improving limitation to return to prior level of function.    Personal Factors and Comorbidities  Comorbidity 2    Comorbidities  DVT, THA, CVA, "lung disorder"    Examination-Activity Limitations  Carry;Lift;Locomotion Level;Stand;Stairs    Examination-Participation Restrictions  Cleaning;Community Activity;Meal Prep    Stability/Clinical Decision Making  Evolving/Moderate complexity    Rehab Potential  Fair    Clinical Impairments Affecting Rehab Potential  (+) highly motivated, family support (-) age, coormorbities    PT Frequency  2x / week    PT Duration  6 weeks    PT Treatment/Interventions  Electrical Stimulation;Iontophoresis 4mg /ml Dexamethasone;Aquatic Therapy;Cryotherapy;Moist Heat;Therapeutic activities;Patient/family education;Manual techniques;Balance training;Stair training;Gait training;Neuromuscular re-education;Therapeutic exercise;Passive range of motion    PT Next Visit Plan  Progress strengthening and balance     PT Home Exercise Plan  See education section    Consulted and Agree with Plan of Care  Patient       Patient will benefit from skilled therapeutic intervention in order to improve the following deficits and impairments:  Abnormal gait, Pain, Decreased coordination, Decreased mobility, Increased muscle spasms, Postural dysfunction, Decreased endurance, Decreased range of motion, Decreased strength, Decreased balance, Difficulty walking, Decreased activity tolerance  Visit Diagnosis: Difficulty in walking, not elsewhere classified  Pain in right hip  Muscle weakness (generalized)     Problem List Patient Active Problem List   Diagnosis Date Noted  .  Acute hypoxemic respiratory failure (HCC)   . Acute kidney injury superimposed on CKD (HCC)   . Acute cholecystitis 08/31/2019  . RUQ pain   . Chronic obstructive pulmonary disease (HCC)   . Acute DVT (deep venous thrombosis) (HCC) 04/29/2018  . 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 AF (paroxysmal atrial fibrillation) (HCC)   . Chronic systolic CHF (congestive heart failure) (HCC)   . Osteoporosis, post-menopausal   . Osteoarthritis, multiple sites     [name], PT DPT 10/23/2019, 8:04 AM  [facility] PHYSICAL [facility] SPORTS MEDICINE [address], [place], [address] Phone: [phone]   Fax:  [phone]  Name: Diana Powell MRN: [id number] Date of Birth: 20-Apr-1927

## 2019-10-24 ENCOUNTER — Encounter: Payer: Self-pay | Admitting: Surgery

## 2019-10-24 ENCOUNTER — Other Ambulatory Visit: Payer: Self-pay | Admitting: Radiology

## 2019-10-24 ENCOUNTER — Ambulatory Visit (INDEPENDENT_AMBULATORY_CARE_PROVIDER_SITE_OTHER): Payer: Medicare Other | Admitting: Surgery

## 2019-10-24 ENCOUNTER — Telehealth: Payer: Self-pay | Admitting: Surgery

## 2019-10-24 ENCOUNTER — Other Ambulatory Visit: Payer: Self-pay

## 2019-10-24 VITALS — BP 146/70 | HR 70 | Temp 97.2°F | Resp 14 | Ht 60.0 in | Wt 121.0 lb

## 2019-10-24 DIAGNOSIS — K810 Acute cholecystitis: Secondary | ICD-10-CM

## 2019-10-24 NOTE — Telephone Encounter (Signed)
Spoke with son, alex, he has been advised of Pre-Admission date/time, COVID Testing date and Surgery date for his mother.   Surgery Date: 10/30/19 Preadmission Testing Date: 10/25/19 (phone 8a-1p) Covid Testing Date: 10/26/19 - patient advised to [facility] ([address] [facility]) between 8a-1p  He also has been made aware to call [phone], between 1-3:00pm the day before surgery, to find out what time to arrive for surgery.

## 2019-10-24 NOTE — Patient Instructions (Addendum)
Once you have a surgery date you will need to stop Eliquis 2 days before the surgery date ( last dose Saturday 10/27/19 night). Once you are done with surgery, they will let you know when you can resume your Eliquis.   Our surgery scheduler will contact you to schedule your surgery. Please have the blue sheet available when she calls you.   Call the office if you have any questions or concerns.   Laparoscopic Cholecystectomy Laparoscopic cholecystectomy is surgery to remove the gallbladder. The gallbladder is a pear-shaped organ that lies beneath the liver on the right side of the body. The gallbladder stores bile, which is a fluid that helps the body to digest fats. Cholecystectomy is often done for inflammation of the gallbladder (cholecystitis). This condition is usually caused by a buildup of gallstones (cholelithiasis) in the gallbladder. Gallstones can block the flow of bile, which can result in inflammation and pain. In severe cases, emergency surgery may be required. This procedure is done though small incisions in your abdomen (laparoscopic surgery). A thin scope with a camera (laparoscope) is inserted through one incision. Thin surgical instruments are inserted through the other incisions. In some cases, a laparoscopic procedure may be turned into a type of surgery that is done through a larger incision (open surgery). Tell a health care provider about:  Any allergies you have.  All medicines you are taking, including vitamins, herbs, eye drops, creams, and over-the-counter medicines.  Any problems you or family members have had with anesthetic medicines.  Any blood disorders you have.  Any surgeries you have had.  Any medical conditions you have.  Whether you are pregnant or may be pregnant. What are the risks? Generally, this is a safe procedure. However, problems may occur, including:  Infection.  Bleeding.  Allergic reactions to medicines.  Damage to other structures or  organs.  A stone remaining in the common bile duct. The common bile duct carries bile from the gallbladder into the small intestine.  A bile leak from the cyst duct that is clipped when your gallbladder is removed. What happens before the procedure? Staying hydrated Follow instructions from your health care provider about hydration, which may include:  Up to 2 hours before the procedure - you may continue to drink clear liquids, such as water, clear fruit juice, black coffee, and plain tea. Eating and drinking restrictions Follow instructions from your health care provider about eating and drinking, which may include:  8 hours before the procedure - stop eating heavy meals or foods such as meat, fried foods, or fatty foods.  6 hours before the procedure - stop eating light meals or foods, such as toast or cereal.  6 hours before the procedure - stop drinking milk or drinks that contain milk.  2 hours before the procedure - stop drinking clear liquids. Medicines  Ask your health care provider about: ? Changing or stopping your regular medicines. This is especially important if you are taking diabetes medicines or blood thinners. ? Taking medicines such as aspirin and ibuprofen. These medicines can thin your blood. Do not take these medicines before your procedure if your health care provider instructs you not to.  You may be given antibiotic medicine to help prevent infection. General instructions  Let your health care provider know if you develop a cold or an infection before surgery.  Plan to have someone take you home from the hospital or clinic.  Ask your health care provider how your surgical site will be  marked or identified. What happens during the procedure?   To reduce your risk of infection: ? Your health care team will wash or sanitize their hands. ? Your skin will be washed with soap. ? Hair may be removed from the surgical area.  An IV tube may be inserted into  one of your veins.  You will be given one or more of the following: ? A medicine to help you relax (sedative). ? A medicine to make you fall asleep (general anesthetic).  A breathing tube will be placed in your mouth.  Your surgeon will make several small cuts (incisions) in your abdomen.  The laparoscope will be inserted through one of the small incisions. The camera on the laparoscope will send images to a TV screen (monitor) in the operating room. This lets your surgeon see inside your abdomen.  Air-like gas will be pumped into your abdomen. This will expand your abdomen to give the surgeon more room to perform the surgery.  Other tools that are needed for the procedure will be inserted through the other incisions. The gallbladder will be removed through one of the incisions.  Your common bile duct may be examined. If stones are found in the common bile duct, they may be removed.  After your gallbladder has been removed, the incisions will be closed with stitches (sutures), staples, or skin glue.  Your incisions may be covered with a bandage (dressing). The procedure may vary among health care providers and hospitals. What happens after the procedure?  Your blood pressure, heart rate, breathing rate, and blood oxygen level will be monitored until the medicines you were given have worn off.  You will be given medicines as needed to control your pain.  Do not drive for 24 hours if you were given a sedative. This information is not intended to replace advice given to you by your health care provider. Make sure you discuss any questions you have with your health care provider. Document Revised: 06/17/2017 Document Reviewed: 12/22/2015 Elsevier Patient Education  2020 [employer].

## 2019-10-24 NOTE — Progress Notes (Signed)
10/24/2019  History of Present Illness: Diana Powell is a 86 y.o. female presents for follow-up from her acute cholecystitis.  Patient had a cholangiogram on 3/15 through her percutaneous cholecystostomy tube.  It was noted that the drain was partially retracted.  She had a drain exchange on 3/19 without complications.  On both studies, the cystic duct appeared obstructed and there was no filling of the common bile duct.  Patient presents today for follow-up.  She reports that she has been doing okay without any significant issues with pain nausea or vomiting.  She does have some discomfort irritation from the drain itself but nothing worsening.  She is having bilious output in the drain has not had any bloody output recently.  She saw her cardiologist on 3/25 and was cleared for surgery with a moderate to high risk.  Dr. Cora Daniels who is her geriatrician, has also cleared her with a medium risk.  The standpoint she is optimized.  Recently her Eliquis was decreased to 2.5 mg twice daily given her weight.  Her son is doing a very good job about keeping her weight stable and adjusting her Lasix as needed.  Past Medical History: Past Medical History:  Diagnosis Date  . Atrial fibrillation (HCC)   . Chronic systolic heart failure (HCC)   . COPD (chronic obstructive pulmonary disease) (HCC)   . Osteoarthritis, multiple sites   . Osteoporosis, post-menopausal   . Presence of permanent cardiac pacemaker      Past Surgical History: Past Surgical History:  Procedure Laterality Date  . CARDIOVERSION  ~2009  . HIP FRACTURE SURGERY Bilateral [phone]   each hip fractured at separate times  . INSERT / REPLACE / REMOVE PACEMAKER    . IR EXCHANGE BILIARY DRAIN  10/05/2019  . TOTAL HIP ARTHROPLASTY Right 04/20/2018   Procedure: TOTAL HIP CONVERSION;  Surgeon: Kennedy Bucker, MD;  Location: [facility] ORS;  Service: Orthopedics;  Laterality: Right;    Home Medications: Prior to Admission medications    Medication Sig Start Date End Date Taking? Authorizing Provider  acetaminophen (TYLENOL) 500 MG tablet Take 500 mg by mouth every 6 (six) hours as needed.   Yes [provider]  budesonide (PULMICORT) 0.5 MG/2ML nebulizer solution Take 0.5 mg by nebulization daily. 01/23/18  Yes [provider]  Calcium Carbonate-Vitamin D (CALCIUM-VITAMIN D) 500-200 MG-UNIT per tablet Take 1 tablet by mouth daily.   Yes [provider]  Disposable Gloves (RA EXTENDED CUFF NITRILE GLOVE) MISC Gloves as indicated for incontinence care 09/12/19  Yes [provider]  ferrous sulfate 325 (65 FE) MG tablet Take 325 mg by mouth daily with breakfast.   Yes [provider]  formoterol (PERFOROMIST) 20 MCG/2ML nebulizer solution Inhale 20 mcg into the lungs every 12 (twelve) hours. 11/30/17  Yes [provider]  furosemide (LASIX) 40 MG tablet Take 40 mg by mouth See admin instructions. Take 80 mg in the morning and 40 mg in the evening   Yes [provider]  Incontinence Supply Disposable (DEPEND EASY FIT UNDERGARMENTS) MISC Please provide pull-ups, gloves, and liners. Use as needed 09/12/19  Yes [provider]  Incontinence Supply Disposable (HARMONIE UNDERPAD) MISC Use as needed for incontinence. 09/12/19  Yes [provider]  ipratropium-albuterol (DUONEB) 0.5-2.5 (3) MG/3ML SOLN Inhale 3 mLs into the lungs every 8 (eight) hours as needed. 08/24/19  Yes [provider]  oxyCODONE (OXY IR/ROXICODONE) 5 MG immediate release tablet TAKE 1 TABLET BY MOUTH EVERY 12 HOURS AS NEEDED FOR  UP TO 5 DAYS FOR MODERATE PAIN. 09/05/19  Yes [provider]  potassium chloride (K-DUR) 10 MEQ tablet Take 10 mEq by mouth daily. 02/18/18  Yes [provider]  vitamin B-12 (CYANOCOBALAMIN) 1000 MCG tablet Take 1,000 mcg by mouth daily.   Yes [provider]  apixaban (ELIQUIS) 5 MG TABS tablet Take 1 tablet (5 mg total) by mouth 2 (two)  times daily for 7 days. 09/05/19 10/05/19  Enzo Bi, MD    Allergies: No Known Allergies  Review of Systems: Review of Systems  Constitutional: Negative for chills and fever.  Respiratory: Negative for shortness of breath.   Cardiovascular: Negative for chest pain.  Gastrointestinal: Negative for abdominal pain, nausea and vomiting.    Physical Exam BP (!) 146/70   Pulse 70   Temp (!) 97.2 F (36.2 C)   Resp 14   Ht 5' (1.524 m)   Wt 121 lb (54.9 kg)   SpO2 97%   BMI 23.63 kg/m  CONSTITUTIONAL: No acute distress HEENT:  Normocephalic, atraumatic, extraocular motion intact. RESPIRATORY:  Lungs are clear, and breath sounds are equal bilaterally. Normal respiratory effort without pathologic use of accessory muscles. CARDIOVASCULAR: Heart is regular without murmurs, gallops, or rubs. GI: The abdomen is soft, nondistended, nontender to palpation.  Patient has a percutaneous cholecystostomy tube in place which is secured well.  There is no purulent drainage around the drain.  The drain bag itself has normal bile fluid.  NEUROLOGIC:  Motor and sensation is grossly normal.  Cranial nerves are grossly intact. PSYCH:  Alert and oriented to person, place and time. Affect is normal.  Labs/Imaging: Cholangiogram on 10/01/2019: IMPRESSION: 1. Drain is positioned in the gallbladder. Tube is slightly retracted. 2. Cholelithiasis. Cystic duct appears to be obstructed. No filling of the common bile duct or duodenum. 3. Plan for drain exchange on 10/05/2019.   Assessment and Plan: This is a 86 y.o. female with a history of acute cholecystitis status post percutaneous cholecystostomy tube on 09/02/2019.  -Discussed with the patient that although she has been cleared both by cardiology and her geriatrician, she is still at risk of a cardiac complication intraoperative or post-operatively.  They understand this and would want to proceed with the surgery. --Discussed with them the plan for robotic  assisted cholecystectomy, with intraoperative ICG cholangiogram.  Discussed risks of bleeding, infection, injury to surrounding structures, possibility of converting to open surgery.  They're willing to proceed. --We will schedule the patient for 10/30/19.  She understands that she would have to stop her Eliquis with her last dose being on Saturday 4/10.  She also understands that she would need a COVID test prior to surgery.  All of their questions have been answered.  Face-to-face time spent with the patient and care providers was 25 minutes, with more than 50% of the time spent counseling, educating, and coordinating care of the patient.     Melvyn Neth, [facility]

## 2019-10-24 NOTE — H&P (View-Only) (Signed)
10/24/2019  History of Present Illness: Diana Powell is a 86 y.o. female presents for follow-up from her acute cholecystitis.  Patient had a cholangiogram on 3/15 through her percutaneous cholecystostomy tube.  It was noted that the drain was partially retracted.  She had a drain exchange on 3/19 without complications.  On both studies, the cystic duct appeared obstructed and there was no filling of the common bile duct.  Patient presents today for follow-up.  She reports that she has been doing okay without any significant issues with pain nausea or vomiting.  She does have some discomfort irritation from the drain itself but nothing worsening.  She is having bilious output in the drain has not had any bloody output recently.  She saw her cardiologist on 3/25 and was cleared for surgery with a moderate to high risk.  Dr. Cora Daniels who is her geriatrician, has also cleared her with a medium risk.  The standpoint she is optimized.  Recently her Eliquis was decreased to 2.5 mg twice daily given her weight.  Her son is doing a very good job about keeping her weight stable and adjusting her Lasix as needed.  Past Medical History: Past Medical History:  Diagnosis Date  . Atrial fibrillation (HCC)   . Chronic systolic heart failure (HCC)   . COPD (chronic obstructive pulmonary disease) (HCC)   . Osteoarthritis, multiple sites   . Osteoporosis, post-menopausal   . Presence of permanent cardiac pacemaker      Past Surgical History: Past Surgical History:  Procedure Laterality Date  . CARDIOVERSION  ~2009  . HIP FRACTURE SURGERY Bilateral [phone]   each hip fractured at separate times  . INSERT / REPLACE / REMOVE PACEMAKER    . IR EXCHANGE BILIARY DRAIN  10/05/2019  . TOTAL HIP ARTHROPLASTY Right 04/20/2018   Procedure: TOTAL HIP CONVERSION;  Surgeon: Kennedy Bucker, MD;  Location: [facility] ORS;  Service: Orthopedics;  Laterality: Right;    Home Medications: Prior to Admission medications    Medication Sig Start Date End Date Taking? Authorizing Provider  acetaminophen (TYLENOL) 500 MG tablet Take 500 mg by mouth every 6 (six) hours as needed.   Yes [provider]  budesonide (PULMICORT) 0.5 MG/2ML nebulizer solution Take 0.5 mg by nebulization daily. 01/23/18  Yes [provider]  Calcium Carbonate-Vitamin D (CALCIUM-VITAMIN D) 500-200 MG-UNIT per tablet Take 1 tablet by mouth daily.   Yes [provider]  Disposable Gloves (RA EXTENDED CUFF NITRILE GLOVE) MISC Gloves as indicated for incontinence care 09/12/19  Yes [provider]  ferrous sulfate 325 (65 FE) MG tablet Take 325 mg by mouth daily with breakfast.   Yes [provider]  formoterol (PERFOROMIST) 20 MCG/2ML nebulizer solution Inhale 20 mcg into the lungs every 12 (twelve) hours. 11/30/17  Yes [provider]  furosemide (LASIX) 40 MG tablet Take 40 mg by mouth See admin instructions. Take 80 mg in the morning and 40 mg in the evening   Yes [provider]  Incontinence Supply Disposable (DEPEND EASY FIT UNDERGARMENTS) MISC Please provide pull-ups, gloves, and liners. Use as needed 09/12/19  Yes [provider]  Incontinence Supply Disposable (HARMONIE UNDERPAD) MISC Use as needed for incontinence. 09/12/19  Yes [provider]  ipratropium-albuterol (DUONEB) 0.5-2.5 (3) MG/3ML SOLN Inhale 3 mLs into the lungs every 8 (eight) hours as needed. 08/24/19  Yes [provider]  oxyCODONE (OXY IR/ROXICODONE) 5 MG immediate release tablet TAKE 1 TABLET BY MOUTH EVERY 12 HOURS AS NEEDED FOR  UP TO 5 DAYS FOR MODERATE PAIN. 09/05/19  Yes [provider]  potassium chloride (K-DUR) 10 MEQ tablet Take 10 mEq by mouth daily. 02/18/18  Yes [provider]  vitamin B-12 (CYANOCOBALAMIN) 1000 MCG tablet Take 1,000 mcg by mouth daily.   Yes [provider]  apixaban (ELIQUIS) 5 MG TABS tablet Take 1 tablet (5 mg total) by mouth 2 (two)  times daily for 7 days. 09/05/19 10/05/19  Lai, Tina, MD    Allergies: No Known Allergies  Review of Systems: Review of Systems  Constitutional: Negative for chills and fever.  Respiratory: Negative for shortness of breath.   Cardiovascular: Negative for chest pain.  Gastrointestinal: Negative for abdominal pain, nausea and vomiting.    Physical Exam BP (!) 146/70   Pulse 70   Temp (!) 97.2 F (36.2 C)   Resp 14   Ht 5' (1.524 m)   Wt 121 lb (54.9 kg)   SpO2 97%   BMI 23.63 kg/m  CONSTITUTIONAL: No acute distress HEENT:  Normocephalic, atraumatic, extraocular motion intact. RESPIRATORY:  Lungs are clear, and breath sounds are equal bilaterally. Normal respiratory effort without pathologic use of accessory muscles. CARDIOVASCULAR: Heart is regular without murmurs, gallops, or rubs. GI: The abdomen is soft, nondistended, nontender to palpation.  Patient has a percutaneous cholecystostomy tube in place which is secured well.  There is no purulent drainage around the drain.  The drain bag itself has normal bile fluid.  NEUROLOGIC:  Motor and sensation is grossly normal.  Cranial nerves are grossly intact. PSYCH:  Alert and oriented to person, place and time. Affect is normal.  Labs/Imaging: Cholangiogram on 10/01/2019: IMPRESSION: 1. Drain is positioned in the gallbladder. Tube is slightly retracted. 2. Cholelithiasis. Cystic duct appears to be obstructed. No filling of the common bile duct or duodenum. 3. Plan for drain exchange on 10/05/2019.   Assessment and Plan: This is a 86 y.o. female with a history of acute cholecystitis status post percutaneous cholecystostomy tube on 09/02/2019.  -Discussed with the patient that although she has been cleared both by cardiology and her geriatrician, she is still at risk of a cardiac complication intraoperative or post-operatively.  They understand this and would want to proceed with the surgery. --Discussed with them the plan for robotic  assisted cholecystectomy, with intraoperative ICG cholangiogram.  Discussed risks of bleeding, infection, injury to surrounding structures, possibility of converting to open surgery.  They're willing to proceed. --We will schedule the patient for 10/30/19.  She understands that she would have to stop her Eliquis with her last dose being on Saturday 4/10.  She also understands that she would need a COVID test prior to surgery.  All of their questions have been answered.  Face-to-face time spent with the patient and care providers was 25 minutes, with more than 50% of the time spent counseling, educating, and coordinating care of the patient.     Nereyda Bowler Luis Toneisha Savary, MD [facility]    

## 2019-10-25 ENCOUNTER — Other Ambulatory Visit: Payer: Self-pay

## 2019-10-25 ENCOUNTER — Encounter
Admission: RE | Admit: 2019-10-25 | Discharge: 2019-10-25 | Disposition: A | Discharge status: Home / Self Care | Payer: Medicare Other | Source: Ambulatory Visit | Attending: Surgery | Admitting: Surgery

## 2019-10-25 HISTORY — DX: Other complications of anesthesia, initial encounter: T88.59XA

## 2019-10-25 HISTORY — DX: Heart failure, unspecified: I50.9

## 2019-10-25 NOTE — Patient Instructions (Signed)
Your procedure is scheduled on: 10/30/19 Report to [facility]. To find out your arrival time please call [phone] between 1PM - 3PM on 10/29/19.  Remember: Instructions that are not followed completely may result in serious medical risk, up to and including death, or upon the discretion of your surgeon and anesthesiologist your surgery may need to be rescheduled.     _X__ 1. Do not eat food after midnight the night before your procedure.                 No gum chewing or hard candies. You may drink clear liquids up to 2 hours                 before you are scheduled to arrive for your surgery- DO not drink clear                 liquids within 2 hours of the start of your surgery.                 Clear Liquids include:  water, apple juice without pulp, clear carbohydrate                 drink such as Clearfast or Gatorade, Black Coffee or Tea (Do not add                 anything to coffee or tea). Diabetics water only  __X__2.  On the morning of surgery brush your teeth with toothpaste and water, you                 may rinse your mouth with mouthwash if you wish.  Do not swallow any              toothpaste of mouthwash.     _X__ 3.  No Alcohol for 24 hours before or after surgery.   _X__ 4.  Do Not Smoke or use e-cigarettes For 24 Hours Prior to Your Surgery.                 Do not use any chewable tobacco products for at least 6 hours prior to                 surgery.  ____  5.  Bring all medications with you on the day of surgery if instructed.   __X__  6.  Notify your doctor if there is any change in your medical condition      (cold, fever, infections).     Do not wear jewelry, make-up, hairpins, clips or nail polish. Do not wear lotions, powders, or perfumes.  Do not shave 48 hours prior to surgery. Men may shave face and neck. Do not bring valuables to the hospital.    [facility] is not responsible for any belongings or  valuables.  Contacts, dentures/partials or body piercings may not be worn into surgery. Bring a case for your contacts, glasses or hearing aids, a denture cup will be supplied. Leave your suitcase in the car. After surgery it may be brought to your room. For patients admitted to the hospital, discharge time is determined by your treatment team.   Patients discharged the day of surgery will not be allowed to drive home.   Please read over the following fact sheets that you were given:   MRSA Information  __X__ Take these medicines the morning of surgery with A SIP OF WATER:  1. NONE  2.   3.   4.  5.  6.  ____ Fleet Enema (as directed)   __X__ Use CHG Soap/SAGE wipes as directed  __X__ Use inhalers on the day of surgery  GIVE A BREATHING TREATMENT BEFORE LEAVING HOME  ____ Stop metformin/Janumet/Farxiga 2 days prior to surgery    ____ Take 1/2 of usual insulin dose the night before surgery. No insulin the morning          of surgery.   ____ Stop Blood Thinners Coumadin/Plavix/Xarelto/Pleta/Pradaxa/Eliquis/Effient/Aspirin  on   Or contact your Surgeon, Cardiologist or Medical Doctor regarding  ability to stop your blood thinners  __X__ Stop Anti-inflammatories 7 days before surgery such as Advil, Ibuprofen, Motrin,  BC or Goodies Powder, Naprosyn, Naproxen, Aleve, Aspirin    __X__ Stop all herbal supplements, fish oil or vitamin E until after surgery.    ____ Bring C-Pap to the hospital.    HOLD LASIX/FUROSEMIDE THE MORNING OF SURGERY.

## 2019-10-25 NOTE — Pre-Procedure Instructions (Signed)
The patients son is her caregiver and HPOA. He manages her fluid balance meticulously to keep her weight at 120 lbs. She takes lasix 40 mg bid, if needed am lasix increased to 80 mg, pm dose remains same at 40 mg until weight back to 120 lbs.  He stated he did not want an interpreter for his mother. He is aware he will have to sign refusal form.

## 2019-10-26 ENCOUNTER — Other Ambulatory Visit
Admission: RE | Admit: 2019-10-26 | Discharge: 2019-10-26 | Disposition: A | Discharge status: Home / Self Care | Payer: Medicare Other | Source: Ambulatory Visit | Attending: Surgery | Admitting: Surgery

## 2019-10-26 DIAGNOSIS — Z01812 Encounter for preprocedural laboratory examination: Secondary | ICD-10-CM | POA: Insufficient documentation

## 2019-10-26 DIAGNOSIS — Z20822 Contact with and (suspected) exposure to covid-19: Secondary | ICD-10-CM | POA: Diagnosis not present

## 2019-10-26 LAB — SARS CORONAVIRUS 2 (TAT 6-24 HRS): SARS Coronavirus 2: NEGATIVE

## 2019-10-26 LAB — TYPE AND SCREEN
ABO/RH(D): A POS
Antibody Screen: NEGATIVE

## 2019-10-29 ENCOUNTER — Telehealth: Payer: Self-pay

## 2019-10-29 NOTE — Telephone Encounter (Signed)
2. Cardiac Pre-operative risk stratification Ms. Diana Powell has cholelithiasis and has a percutaneous drain in place. She is being prepared for a cholecystectomy, likely laproscopic. However, it would still require GA and therefore, at least a moderate risk surgery for her age.  - She has very advanced HFpEF which has been managed as well as possible. However, considering her advanced age, HFpEF, AF and COPD; she would be at moderate to high risk of surgery. No further cardiac testing is required before proceeding to surgery. Recommend that she is in optimal volume status (as she is currently) at the time of surgery.     Our office has re-faxed over the Clearance paperwork to the Dr.Sameer Arora's office and we received the information above for from Dr.Sameer Arora's office on behalf of patient's surgery request.

## 2019-10-30 ENCOUNTER — Ambulatory Visit: Payer: Medicare Other | Admitting: Anesthesiology

## 2019-10-30 ENCOUNTER — Encounter
Admission: RE | Disposition: A | Discharge status: Home / Self Care | Payer: Self-pay | Source: Home / Self Care | Attending: Surgery

## 2019-10-30 ENCOUNTER — Encounter: Payer: Self-pay | Admitting: Surgery

## 2019-10-30 ENCOUNTER — Ambulatory Visit
Admission: RE | Admit: 2019-10-30 | Discharge: 2019-10-30 | Disposition: A | Discharge status: Home / Self Care | Payer: Medicare Other | Attending: Surgery | Admitting: Surgery

## 2019-10-30 ENCOUNTER — Ambulatory Visit: Payer: Medicare Other

## 2019-10-30 DIAGNOSIS — Z96641 Presence of right artificial hip joint: Secondary | ICD-10-CM | POA: Insufficient documentation

## 2019-10-30 DIAGNOSIS — M159 Polyosteoarthritis, unspecified: Secondary | ICD-10-CM | POA: Diagnosis not present

## 2019-10-30 DIAGNOSIS — J449 Chronic obstructive pulmonary disease, unspecified: Secondary | ICD-10-CM | POA: Insufficient documentation

## 2019-10-30 DIAGNOSIS — Z7951 Long term (current) use of inhaled steroids: Secondary | ICD-10-CM | POA: Diagnosis not present

## 2019-10-30 DIAGNOSIS — Z79899 Other long term (current) drug therapy: Secondary | ICD-10-CM | POA: Diagnosis not present

## 2019-10-30 DIAGNOSIS — M810 Age-related osteoporosis without current pathological fracture: Secondary | ICD-10-CM | POA: Diagnosis not present

## 2019-10-30 DIAGNOSIS — Z7901 Long term (current) use of anticoagulants: Secondary | ICD-10-CM | POA: Diagnosis not present

## 2019-10-30 DIAGNOSIS — I5022 Chronic systolic (congestive) heart failure: Secondary | ICD-10-CM | POA: Diagnosis not present

## 2019-10-30 DIAGNOSIS — I4891 Unspecified atrial fibrillation: Secondary | ICD-10-CM | POA: Diagnosis not present

## 2019-10-30 DIAGNOSIS — K8012 Calculus of gallbladder with acute and chronic cholecystitis without obstruction: Secondary | ICD-10-CM | POA: Diagnosis not present

## 2019-10-30 DIAGNOSIS — K810 Acute cholecystitis: Secondary | ICD-10-CM | POA: Diagnosis present

## 2019-10-30 DIAGNOSIS — Z950 Presence of cardiac pacemaker: Secondary | ICD-10-CM | POA: Diagnosis not present

## 2019-10-30 SURGERY — CHOLECYSTECTOMY, ROBOT-ASSISTED, LAPAROSCOPIC
Anesthesia: General

## 2019-10-30 MED ORDER — ACETAMINOPHEN 500 MG PO TABS: 1000.0000 mg | ORAL_TABLET | Freq: Four times a day (QID) | ORAL | 0 refills | Status: AC | PRN

## 2019-10-30 MED ORDER — INDOCYANINE GREEN 25 MG IV SOLR
2.5000 mg | Freq: Once | INTRAVENOUS | Status: AC
  Administered 2019-10-30: 2.5 mg via INTRAVENOUS
  Filled 2019-10-30: qty 10

## 2019-10-30 MED ORDER — CHLORHEXIDINE GLUCONATE CLOTH 2 % EX PADS
6.0000 | MEDICATED_PAD | Freq: Once | CUTANEOUS | Status: AC
  Administered 2019-10-30: 6 via TOPICAL

## 2019-10-30 MED ORDER — CEFAZOLIN SODIUM-DEXTROSE 2-4 GM/100ML-% IV SOLN
INTRAVENOUS | Status: AC
  Filled 2019-10-30: qty 100

## 2019-10-30 MED ORDER — FENTANYL CITRATE (PF) 100 MCG/2ML IJ SOLN
INTRAMUSCULAR | Status: AC
  Administered 2019-10-30: 12.5 ug via INTRAVENOUS
  Filled 2019-10-30: qty 2

## 2019-10-30 MED ORDER — ROCURONIUM BROMIDE 10 MG/ML (PF) SYRINGE
PREFILLED_SYRINGE | INTRAVENOUS | Status: AC
  Filled 2019-10-30: qty 10

## 2019-10-30 MED ORDER — KETOROLAC TROMETHAMINE 15 MG/ML IJ SOLN
INTRAMUSCULAR | Status: AC
  Filled 2019-10-30: qty 1

## 2019-10-30 MED ORDER — ROCURONIUM BROMIDE 100 MG/10ML IV SOLN
INTRAVENOUS | Status: DC | PRN
  Administered 2019-10-30: 20 mg via INTRAVENOUS
  Administered 2019-10-30: 50 mg via INTRAVENOUS

## 2019-10-30 MED ORDER — LABETALOL HCL 5 MG/ML IV SOLN
INTRAVENOUS | Status: DC | PRN
  Administered 2019-10-30: 5 mg via INTRAVENOUS
  Administered 2019-10-30: 2.5 mg via INTRAVENOUS

## 2019-10-30 MED ORDER — ACETAMINOPHEN 500 MG PO TABS
ORAL_TABLET | ORAL | Status: AC
  Filled 2019-10-30: qty 2

## 2019-10-30 MED ORDER — LACTATED RINGERS IV SOLN: INTRAVENOUS | Status: DC

## 2019-10-30 MED ORDER — FAMOTIDINE 20 MG PO TABS
20.0000 mg | ORAL_TABLET | Freq: Once | ORAL | Status: AC
  Administered 2019-10-30: 20 mg via ORAL

## 2019-10-30 MED ORDER — ACETAMINOPHEN 500 MG PO TABS
1000.0000 mg | ORAL_TABLET | ORAL | Status: AC
  Administered 2019-10-30: 1000 mg via ORAL

## 2019-10-30 MED ORDER — BUPIVACAINE LIPOSOME 1.3 % IJ SUSP: 20.0000 mL | Freq: Once | INTRAMUSCULAR | Status: DC

## 2019-10-30 MED ORDER — KETOROLAC TROMETHAMINE 15 MG/ML IJ SOLN
15.0000 mg | Freq: Once | INTRAMUSCULAR | Status: AC
  Administered 2019-10-30: 15 mg via INTRAVENOUS

## 2019-10-30 MED ORDER — FENTANYL CITRATE (PF) 100 MCG/2ML IJ SOLN
INTRAMUSCULAR | Status: AC
  Filled 2019-10-30: qty 2

## 2019-10-30 MED ORDER — ONDANSETRON HCL 4 MG/2ML IJ SOLN
INTRAMUSCULAR | Status: DC | PRN
  Administered 2019-10-30: 4 mg via INTRAVENOUS

## 2019-10-30 MED ORDER — BUPIVACAINE-EPINEPHRINE (PF) 0.25% -1:200000 IJ SOLN
INTRAMUSCULAR | Status: DC | PRN
  Administered 2019-10-30: 30 mL

## 2019-10-30 MED ORDER — PROPOFOL 10 MG/ML IV BOLUS
INTRAVENOUS | Status: DC | PRN
  Administered 2019-10-30: 80 mg via INTRAVENOUS

## 2019-10-30 MED ORDER — LABETALOL HCL 5 MG/ML IV SOLN
INTRAVENOUS | Status: AC
  Filled 2019-10-30: qty 4

## 2019-10-30 MED ORDER — FENTANYL CITRATE (PF) 100 MCG/2ML IJ SOLN
INTRAMUSCULAR | Status: DC | PRN
  Administered 2019-10-30 (×2): 50 ug via INTRAVENOUS

## 2019-10-30 MED ORDER — SEVOFLURANE IN SOLN
RESPIRATORY_TRACT | Status: AC
  Filled 2019-10-30: qty 250

## 2019-10-30 MED ORDER — SUGAMMADEX SODIUM 200 MG/2ML IV SOLN
INTRAVENOUS | Status: DC | PRN
  Administered 2019-10-30: 50 mg via INTRAVENOUS
  Administered 2019-10-30: 100 mg via INTRAVENOUS

## 2019-10-30 MED ORDER — PROPOFOL 10 MG/ML IV BOLUS
INTRAVENOUS | Status: AC
  Filled 2019-10-30: qty 20

## 2019-10-30 MED ORDER — LIDOCAINE HCL (CARDIAC) PF 100 MG/5ML IV SOSY
PREFILLED_SYRINGE | INTRAVENOUS | Status: DC | PRN
  Administered 2019-10-30: 100 mg via INTRAVENOUS

## 2019-10-30 MED ORDER — CEFAZOLIN SODIUM-DEXTROSE 2-4 GM/100ML-% IV SOLN
2.0000 g | INTRAVENOUS | Status: AC
  Administered 2019-10-30: 2 g via INTRAVENOUS

## 2019-10-30 MED ORDER — DEXAMETHASONE SODIUM PHOSPHATE 10 MG/ML IJ SOLN
INTRAMUSCULAR | Status: AC
  Filled 2019-10-30: qty 1

## 2019-10-30 MED ORDER — ONDANSETRON HCL 4 MG/2ML IJ SOLN: 4.0000 mg | Freq: Once | INTRAMUSCULAR | Status: DC | PRN

## 2019-10-30 MED ORDER — IBUPROFEN 400 MG PO TABS: 400.0000 mg | ORAL_TABLET | Freq: Four times a day (QID) | ORAL | 1 refills | Status: DC | PRN

## 2019-10-30 MED ORDER — OXYCODONE HCL 5 MG PO TABS: 5.0000 mg | ORAL_TABLET | Freq: Four times a day (QID) | ORAL | 0 refills | Status: DC | PRN

## 2019-10-30 MED ORDER — ONDANSETRON HCL 4 MG/2ML IJ SOLN
INTRAMUSCULAR | Status: AC
  Filled 2019-10-30: qty 2

## 2019-10-30 MED ORDER — DEXAMETHASONE SODIUM PHOSPHATE 10 MG/ML IJ SOLN
INTRAMUSCULAR | Status: DC | PRN
  Administered 2019-10-30: 8 mg via INTRAVENOUS

## 2019-10-30 MED ORDER — BUPIVACAINE LIPOSOME 1.3 % IJ SUSP
INTRAMUSCULAR | Status: AC
  Filled 2019-10-30: qty 20

## 2019-10-30 MED ORDER — FENTANYL CITRATE (PF) 100 MCG/2ML IJ SOLN: 25.0000 ug | INTRAMUSCULAR | Status: DC | PRN

## 2019-10-30 MED ORDER — PROPOFOL 500 MG/50ML IV EMUL
INTRAVENOUS | Status: DC | PRN
  Administered 2019-10-30: 50 ug/kg/min via INTRAVENOUS

## 2019-10-30 MED ORDER — PHENYLEPHRINE HCL (PRESSORS) 10 MG/ML IV SOLN
INTRAVENOUS | Status: DC | PRN
  Administered 2019-10-30: 100 ug via INTRAVENOUS

## 2019-10-30 MED ORDER — FAMOTIDINE 20 MG PO TABS
ORAL_TABLET | ORAL | Status: AC
  Filled 2019-10-30: qty 1

## 2019-10-30 MED ORDER — FENTANYL CITRATE (PF) 100 MCG/2ML IJ SOLN
25.0000 ug | INTRAMUSCULAR | Status: DC | PRN
  Administered 2019-10-30 (×2): 12.5 ug via INTRAVENOUS

## 2019-10-30 MED ORDER — BUPIVACAINE-EPINEPHRINE (PF) 0.25% -1:200000 IJ SOLN
INTRAMUSCULAR | Status: AC
  Filled 2019-10-30: qty 30

## 2019-10-30 SURGICAL SUPPLY — 49 items
CANISTER SUCT 1200ML W/VALVE (MISCELLANEOUS) ×3 IMPLANT
CHLORAPREP W/TINT 26 (MISCELLANEOUS) ×3 IMPLANT
CLIP VESOLOCK MED LG 6/CT (CLIP) ×7 IMPLANT
COVER WAND RF STERILE (DRAPES) ×3 IMPLANT
DECANTER SPIKE VIAL GLASS SM (MISCELLANEOUS) ×3 IMPLANT
DEFOGGER SCOPE WARMER CLEARIFY (MISCELLANEOUS) ×3 IMPLANT
DERMABOND ADVANCED (GAUZE/BANDAGES/DRESSINGS) ×2
DERMABOND ADVANCED .7 DNX12 (GAUZE/BANDAGES/DRESSINGS) ×1 IMPLANT
DRAPE ARM DVNC X/XI (DISPOSABLE) ×4 IMPLANT
DRAPE COLUMN DVNC XI (DISPOSABLE) ×1 IMPLANT
DRAPE DA VINCI XI ARM (DISPOSABLE) ×8
DRAPE DA VINCI XI COLUMN (DISPOSABLE) ×2
ELECT CAUTERY BLADE 6.4 (BLADE) ×3 IMPLANT
ELECT REM PT RETURN 9FT ADLT (ELECTROSURGICAL) ×3
ELECTRODE REM PT RTRN 9FT ADLT (ELECTROSURGICAL) ×1 IMPLANT
GLOVE SURG SYN 7.0 (GLOVE) ×12 IMPLANT
GLOVE SURG SYN 7.0 PF PI (GLOVE) ×2 IMPLANT
GLOVE SURG SYN 7.5  E (GLOVE) ×8
GLOVE SURG SYN 7.5 E (GLOVE) ×4 IMPLANT
GLOVE SURG SYN 7.5 PF PI (GLOVE) ×2 IMPLANT
GOWN STRL REUS W/ TWL LRG LVL3 (GOWN DISPOSABLE) ×4 IMPLANT
GOWN STRL REUS W/TWL LRG LVL3 (GOWN DISPOSABLE) ×8
IRRIGATOR SUCT 8 DISP DVNC XI (IRRIGATION / IRRIGATOR) IMPLANT
IRRIGATOR SUCTION 8MM XI DISP (IRRIGATION / IRRIGATOR) ×2
IV NS 1000ML (IV SOLUTION) ×2
IV NS 1000ML BAXH (IV SOLUTION) IMPLANT
KIT PINK PAD W/HEAD ARE REST (MISCELLANEOUS) ×3
KIT PINK PAD W/HEAD ARM REST (MISCELLANEOUS) ×1 IMPLANT
LABEL OR SOLS (LABEL) ×3 IMPLANT
NEEDLE HYPO 22GX1.5 SAFETY (NEEDLE) ×3 IMPLANT
NS IRRIG 500ML POUR BTL (IV SOLUTION) ×3 IMPLANT
OBTURATOR OPTICAL STANDARD 8MM (TROCAR) ×2
OBTURATOR OPTICAL STND 8 DVNC (TROCAR) ×1
OBTURATOR OPTICALSTD 8 DVNC (TROCAR) ×1 IMPLANT
PACK LAP CHOLECYSTECTOMY (MISCELLANEOUS) ×3 IMPLANT
PENCIL ELECTRO HAND CTR (MISCELLANEOUS) ×3 IMPLANT
POUCH SPECIMEN RETRIEVAL 10MM (ENDOMECHANICALS) ×3 IMPLANT
SEAL CANN UNIV 5-8 DVNC XI (MISCELLANEOUS) ×4 IMPLANT
SEAL XI 5MM-8MM UNIVERSAL (MISCELLANEOUS) ×8
SET TUBE SMOKE EVAC HIGH FLOW (TUBING) ×3 IMPLANT
SOLUTION ELECTROLUBE (MISCELLANEOUS) ×3 IMPLANT
SPONGE LAP 18X18 RF (DISPOSABLE) ×1 IMPLANT
SPONGE LAP 4X18 RFD (DISPOSABLE) ×2 IMPLANT
SUT MNCRL AB 4-0 PS2 18 (SUTURE) ×5 IMPLANT
SUT PDS AB 1 CT1 27 (SUTURE) ×2 IMPLANT
SUT VIC AB 3-0 SH 27 (SUTURE) ×2
SUT VIC AB 3-0 SH 27X BRD (SUTURE) IMPLANT
SUT VICRYL 0 AB UR-6 (SUTURE) ×6 IMPLANT
TROCAR 130MM GELPORT  DAV (MISCELLANEOUS) ×3 IMPLANT

## 2019-10-30 NOTE — Progress Notes (Signed)
Moaning with pain  Fentanyl drops oxygen sats   Will try toradol

## 2019-10-30 NOTE — Anesthesia Preprocedure Evaluation (Signed)
Anesthesia Evaluation  Patient identified by MRN, date of birth, ID band Patient awake    Reviewed: Allergy & Precautions, H&P , NPO status , Patient's Chart, lab work & pertinent test results, reviewed documented beta blocker date and time   History of Anesthesia Complications (+) history of anesthetic complications  Airway Mallampati: II  TM Distance: >3 FB Neck ROM: full    Dental  (+) Teeth Intact   Pulmonary COPD,    Pulmonary exam normal        Cardiovascular Exercise Tolerance: Poor +CHF  Atrial Fibrillation + pacemaker  Rhythm:regular Rate:Normal  Ef 45 to 50 % and echo noted. JA   Neuro/Psych PSYCHIATRIC DISORDERS Dementia negative neurological ROS     GI/Hepatic negative GI ROS, Neg liver ROS,   Endo/Other  negative endocrine ROS  Renal/GU Renal disease  negative genitourinary   Musculoskeletal   Abdominal   Peds  Hematology negative hematology ROS (+)   Anesthesia Other Findings Past Medical History: No date: Atrial fibrillation (HCC) No date: CHF (congestive heart failure) (HCC) No date: Chronic systolic heart failure (HCC) No date: Complication of anesthesia     Comment:  CONFUSION  No date: COPD (chronic obstructive pulmonary disease) (HCC) No date: Osteoarthritis, multiple sites No date: Osteoporosis, post-menopausal No date: Presence of permanent cardiac pacemaker Past Surgical History: ~2009: CARDIOVERSION [id number]: HIP FRACTURE SURGERY; Bilateral     Comment:  each hip fractured at separate times No date: INSERT / REPLACE / REMOVE PACEMAKER 10/05/2019: IR EXCHANGE BILIARY DRAIN 04/20/2018: TOTAL HIP ARTHROPLASTY; Right     Comment:  Procedure: TOTAL HIP CONVERSION;  Surgeon: Kennedy Bucker, MD;  Location: [facility] ORS;  Service: Orthopedics;               Laterality: Right;   Reproductive/Obstetrics negative OB ROS                              Anesthesia Physical Anesthesia Plan  ASA: III  Anesthesia Plan: General ETT   Post-op Pain Management:    Induction:   PONV Risk Score and Plan:   Airway Management Planned:   Additional Equipment:   Intra-op Plan:   Post-operative Plan:   Informed Consent: I have reviewed the patients History and Physical, chart, labs and discussed the procedure including the risks, benefits and alternatives for the proposed anesthesia with the patient or authorized representative who has indicated his/her understanding and acceptance.     Dental Advisory Given  Plan Discussed with: CRNA  Anesthesia Plan Comments:         Anesthesia Quick Evaluation

## 2019-10-30 NOTE — Anesthesia Procedure Notes (Signed)
Procedure Name: Intubation Date/Time: 10/30/2019 10:48 AM Performed by: Omer Jack, CRNA Pre-anesthesia Checklist: Patient identified, Patient being monitored, Timeout performed, Emergency Drugs available and Suction available Patient Re-evaluated:Patient Re-evaluated prior to induction Oxygen Delivery Method: Circle system utilized Preoxygenation: Pre-oxygenation with 100% oxygen Induction Type: IV induction Ventilation: Mask ventilation without difficulty Laryngoscope Size: Miller and 2 Grade View: Grade I Tube type: Oral Tube size: 7.0 mm Number of attempts: 1 Airway Equipment and Method: Stylet Placement Confirmation: ETT inserted through vocal cords under direct vision,  positive ETCO2 and breath sounds checked- equal and bilateral Secured at: 22 cm Tube secured with: Tape Dental Injury: Teeth and Oropharynx as per pre-operative assessment

## 2019-10-30 NOTE — Op Note (Signed)
  Procedure Date:  10/30/2019  Pre-operative Diagnosis:  Cholecystitis, s/p percutaneous cholecystostomy tube  Post-operative Diagnosis:  Same  Procedure:  Robotic assisted Laparoscopic cholecystectomy with ICG cholangiogram.  Surgeon:  Howie Ill, MD  Assistant:  Karn Pickler, PA-S  Anesthesia:  General endotracheal  Estimated Blood Loss:  30 ml  Specimens:  gallbladder  Complications:  None  Indications for Procedure:  This is a 86 y.o. female who presents with a history of acute cholecystitis, s/p percutaneous cholecystostomy tube placement in the past.  The benefits, complications, treatment options, and expected outcomes were discussed with the patient. The risks of bleeding, infection, recurrence of symptoms, failure to resolve symptoms, bile duct damage, bile duct leak, retained common bile duct stone, bowel injury, and need for further procedures were all discussed with the patient and she was willing to proceed.  Description of Procedure: The patient was correctly identified in the preoperative area and brought into the operating room.  The patient was placed supine with VTE prophylaxis in place.  Appropriate time-outs were performed.  Anesthesia was induced and the patient was intubated.  Appropriate antibiotics were infused.  The previous cholecystostomy tube was removed.   The abdomen was prepped and draped in a sterile fashion. An infraumbilical incision was made. A cutdown technique was used to enter the abdominal cavity without injury, and a Hasson trocar was inserted.  Pneumoperitoneum was obtained with appropriate opening pressures.  Three 8 mm robotic ports were placed in the lower abdomen under direct visualization.  The Da Vinci robotic platform was then brought to the patient's side and the arms were docked, with appropriate targeting for the gallbladder.  The gallbladder was identified.  The fundus was grasped and retracted cephalad.  Adhesions were lysed  bluntly and with electrocautery. The infundibulum was grasped and retracted laterally, exposing the peritoneum overlying the gallbladder.  This was incised with electrocautery and extended on either side of the gallbladder.  Firefly was used to perform an ICG cholangiogram, identifying the cystic duct and common bile duct.  The cystic duct and cystic artery were clearly identified and bluntly dissected.  Both were clipped twice proximally and once distally, cutting in between.  The gallbladder was taken from the gallbladder fossa in a retrograde fashion with electrocautery.  The liver bed was inspected and any bleeding was controlled with electrocautery. The right upper quadrant was then inspected again revealing intact clips, no bleeding, and no ductal injury.  The area was thoroughly irrigated.  A laparoscopic grasper was then placed on the right lateral port to grab the gallbladder and the Da Vinci platform was undocked and instruments removed.  The gallbladder was placed in an Endocatch bag.  The 8 mm ports were removed under direct visualization and the Hasson trocar was removed.  The Hasson port site had to be enlarged in order to allow the large gallbladder and stone to be extracted. The fascial opening was closed using #1 PDS suture.  Local anesthetic was infused in all incisions and the incisions were closed with 4-0 Monocryl.  The wounds were cleaned and sealed with DermaBond.  The patient was emerged from anesthesia and extubated and brought to the recovery room for further management.  The patient tolerated the procedure well and all counts were correct at the end of the case.   Howie Ill, MD

## 2019-10-30 NOTE — Transfer of Care (Signed)
Immediate Anesthesia Transfer of Care Note  Patient: Diana Powell  Procedure(s) Performed: XI ROBOTIC ASSISTED LAPAROSCOPIC CHOLECYSTECTOMY w/ICG Cholangiogram (N/A ) INDOCYANINE GREEN FLUORESCENCE IMAGING (ICG) (N/A )  Patient Location: PACU  Anesthesia Type:General  Level of Consciousness: drowsy and patient cooperative  Airway & Oxygen Therapy: Patient Spontanous Breathing and Patient connected to face mask oxygen  Post-op Assessment: Report given to RN and Post -op Vital signs reviewed and stable  Post vital signs: Reviewed and stable  Last Vitals:  Vitals Value Taken Time  BP 177/71 10/30/19 1341  Temp 36 C 10/30/19 1341  Pulse 70 10/30/19 1347  Resp 20 10/30/19 1347  SpO2 100 % 10/30/19 1347  Vitals shown include unvalidated device data.  Last Pain:  Vitals:   10/30/19 0940  TempSrc: Temporal  PainSc: 0-No pain         Complications: No apparent anesthesia complications

## 2019-10-30 NOTE — Discharge Instructions (Signed)
AMBULATORY SURGERY  °DISCHARGE INSTRUCTIONS ° ° °1) The drugs that you were given will stay in your system until tomorrow so for the next 24 hours you should not: ° °A) Drive an automobile °B) Make any legal decisions °C) Drink any alcoholic beverage ° ° °2) You may resume regular meals tomorrow.  Today it is better to start with liquids and gradually work up to solid foods. ° °You may eat anything you prefer, but it is better to start with liquids, then soup and crackers, and gradually work up to solid foods. ° ° °3) Please notify your doctor immediately if you have any unusual bleeding, trouble breathing, redness and pain at the surgery site, drainage, fever, or pain not relieved by medication. ° ° ° °4) Additional Instructions: ° ° ° ° ° ° ° °Please contact your physician with any problems or Same Day Surgery at [phone], Monday through Friday 6 am to 4 pm, or [place] [facility] [place] [facility] number at [phone]. °

## 2019-10-30 NOTE — Interval H&P Note (Signed)
History and Physical Interval Note:  10/30/2019 10:06 AM  Diana Powell  has presented today for surgery, with the diagnosis of Acute choleysistis.  The various methods of treatment have been discussed with the patient and family. After consideration of risks, benefits and other options for treatment, the patient has consented to  Procedure(s): XI ROBOTIC ASSISTED LAPAROSCOPIC CHOLECYSTECTOMY w/ICG Cholangiogram (N/A) INDOCYANINE GREEN FLUORESCENCE IMAGING (ICG) (N/A) as a surgical intervention.  The patient's history has been reviewed, patient examined, no change in status, stable for surgery.  I have reviewed the patient's chart and labs.  Questions were answered to the patient's satisfaction.     Diana Powell

## 2019-11-01 LAB — SURGICAL PATHOLOGY

## 2019-11-02 NOTE — Progress Notes (Unsigned)
Received paper copy of Cardiac Clearance for patient from Dr Bess Harvest office. Patient is cleared at moderate risk for surgery.

## 2019-11-05 ENCOUNTER — Emergency Department: Payer: Medicare Other

## 2019-11-05 ENCOUNTER — Inpatient Hospital Stay
Admission: EM | Admit: 2019-11-05 | Discharge: 2019-11-08 | DRG: 291 | Disposition: A | Discharge status: Home Health | Payer: Medicare Other | Attending: Internal Medicine | Admitting: Internal Medicine

## 2019-11-05 DIAGNOSIS — Z7951 Long term (current) use of inhaled steroids: Secondary | ICD-10-CM

## 2019-11-05 DIAGNOSIS — Z9049 Acquired absence of other specified parts of digestive tract: Secondary | ICD-10-CM

## 2019-11-05 DIAGNOSIS — Z8249 Family history of ischemic heart disease and other diseases of the circulatory system: Secondary | ICD-10-CM

## 2019-11-05 DIAGNOSIS — I509 Heart failure, unspecified: Secondary | ICD-10-CM

## 2019-11-05 DIAGNOSIS — Z86718 Personal history of other venous thrombosis and embolism: Secondary | ICD-10-CM | POA: Diagnosis not present

## 2019-11-05 DIAGNOSIS — Z96641 Presence of right artificial hip joint: Secondary | ICD-10-CM | POA: Diagnosis present

## 2019-11-05 DIAGNOSIS — Z6824 Body mass index (BMI) 24.0-24.9, adult: Secondary | ICD-10-CM

## 2019-11-05 DIAGNOSIS — I5033 Acute on chronic diastolic (congestive) heart failure: Secondary | ICD-10-CM

## 2019-11-05 DIAGNOSIS — I4821 Permanent atrial fibrillation: Secondary | ICD-10-CM | POA: Diagnosis not present

## 2019-11-05 DIAGNOSIS — Z950 Presence of cardiac pacemaker: Secondary | ICD-10-CM

## 2019-11-05 DIAGNOSIS — N1832 Chronic kidney disease, stage 3b: Secondary | ICD-10-CM | POA: Diagnosis not present

## 2019-11-05 DIAGNOSIS — I480 Paroxysmal atrial fibrillation: Secondary | ICD-10-CM | POA: Diagnosis present

## 2019-11-05 DIAGNOSIS — E440 Moderate protein-calorie malnutrition: Secondary | ICD-10-CM | POA: Diagnosis not present

## 2019-11-05 DIAGNOSIS — I495 Sick sinus syndrome: Secondary | ICD-10-CM | POA: Diagnosis not present

## 2019-11-05 DIAGNOSIS — Z7901 Long term (current) use of anticoagulants: Secondary | ICD-10-CM

## 2019-11-05 DIAGNOSIS — J9601 Acute respiratory failure with hypoxia: Secondary | ICD-10-CM | POA: Diagnosis not present

## 2019-11-05 DIAGNOSIS — J441 Chronic obstructive pulmonary disease with (acute) exacerbation: Secondary | ICD-10-CM | POA: Diagnosis not present

## 2019-11-05 DIAGNOSIS — D631 Anemia in chronic kidney disease: Secondary | ICD-10-CM | POA: Diagnosis present

## 2019-11-05 DIAGNOSIS — Z79899 Other long term (current) drug therapy: Secondary | ICD-10-CM

## 2019-11-05 DIAGNOSIS — I130 Hypertensive heart and chronic kidney disease with heart failure and stage 1 through stage 4 chronic kidney disease, or unspecified chronic kidney disease: Secondary | ICD-10-CM | POA: Diagnosis not present

## 2019-11-05 DIAGNOSIS — I5030 Unspecified diastolic (congestive) heart failure: Secondary | ICD-10-CM | POA: Diagnosis present

## 2019-11-05 DIAGNOSIS — M810 Age-related osteoporosis without current pathological fracture: Secondary | ICD-10-CM | POA: Diagnosis not present

## 2019-11-05 DIAGNOSIS — I5043 Acute on chronic combined systolic (congestive) and diastolic (congestive) heart failure: Secondary | ICD-10-CM | POA: Diagnosis not present

## 2019-11-05 DIAGNOSIS — N183 Chronic kidney disease, stage 3 unspecified: Secondary | ICD-10-CM

## 2019-11-05 DIAGNOSIS — Z20822 Contact with and (suspected) exposure to covid-19: Secondary | ICD-10-CM | POA: Diagnosis present

## 2019-11-05 DIAGNOSIS — I5023 Acute on chronic systolic (congestive) heart failure: Secondary | ICD-10-CM

## 2019-11-05 MED ORDER — IPRATROPIUM-ALBUTEROL 0.5-2.5 (3) MG/3ML IN SOLN
3.0000 mL | Freq: Once | RESPIRATORY_TRACT | Status: AC
  Administered 2019-11-06: 3 mL via RESPIRATORY_TRACT
  Filled 2019-11-05: qty 3

## 2019-11-05 MED ORDER — METHYLPREDNISOLONE SODIUM SUCC 125 MG IJ SOLR
125.0000 mg | Freq: Once | INTRAMUSCULAR | Status: AC
  Administered 2019-11-06: 125 mg via INTRAVENOUS
  Filled 2019-11-05: qty 2

## 2019-11-05 NOTE — ED Triage Notes (Signed)
Pt arrived from home via [facility] with SOB.   Hx of COPD.  Pt states she hasn't felt well yesterday or today.  Arrived on [address]L [address] [place].

## 2019-11-06 ENCOUNTER — Emergency Department: Payer: Medicare Other

## 2019-11-06 ENCOUNTER — Other Ambulatory Visit: Payer: Self-pay

## 2019-11-06 DIAGNOSIS — Z86718 Personal history of other venous thrombosis and embolism: Secondary | ICD-10-CM | POA: Diagnosis not present

## 2019-11-06 DIAGNOSIS — I4821 Permanent atrial fibrillation: Secondary | ICD-10-CM | POA: Diagnosis not present

## 2019-11-06 DIAGNOSIS — I509 Heart failure, unspecified: Secondary | ICD-10-CM | POA: Diagnosis present

## 2019-11-06 DIAGNOSIS — Z20822 Contact with and (suspected) exposure to covid-19: Secondary | ICD-10-CM | POA: Diagnosis not present

## 2019-11-06 DIAGNOSIS — I130 Hypertensive heart and chronic kidney disease with heart failure and stage 1 through stage 4 chronic kidney disease, or unspecified chronic kidney disease: Secondary | ICD-10-CM | POA: Diagnosis not present

## 2019-11-06 DIAGNOSIS — E440 Moderate protein-calorie malnutrition: Secondary | ICD-10-CM | POA: Diagnosis not present

## 2019-11-06 DIAGNOSIS — Z9049 Acquired absence of other specified parts of digestive tract: Secondary | ICD-10-CM | POA: Diagnosis not present

## 2019-11-06 DIAGNOSIS — Z79899 Other long term (current) drug therapy: Secondary | ICD-10-CM | POA: Diagnosis not present

## 2019-11-06 DIAGNOSIS — I5030 Unspecified diastolic (congestive) heart failure: Secondary | ICD-10-CM | POA: Diagnosis present

## 2019-11-06 DIAGNOSIS — Z7951 Long term (current) use of inhaled steroids: Secondary | ICD-10-CM | POA: Diagnosis not present

## 2019-11-06 DIAGNOSIS — J9601 Acute respiratory failure with hypoxia: Secondary | ICD-10-CM | POA: Diagnosis not present

## 2019-11-06 DIAGNOSIS — J441 Chronic obstructive pulmonary disease with (acute) exacerbation: Secondary | ICD-10-CM | POA: Diagnosis not present

## 2019-11-06 DIAGNOSIS — Z7901 Long term (current) use of anticoagulants: Secondary | ICD-10-CM | POA: Diagnosis not present

## 2019-11-06 DIAGNOSIS — D631 Anemia in chronic kidney disease: Secondary | ICD-10-CM | POA: Diagnosis not present

## 2019-11-06 DIAGNOSIS — Z96641 Presence of right artificial hip joint: Secondary | ICD-10-CM | POA: Diagnosis not present

## 2019-11-06 DIAGNOSIS — N1832 Chronic kidney disease, stage 3b: Secondary | ICD-10-CM | POA: Diagnosis not present

## 2019-11-06 DIAGNOSIS — Z8249 Family history of ischemic heart disease and other diseases of the circulatory system: Secondary | ICD-10-CM | POA: Diagnosis not present

## 2019-11-06 DIAGNOSIS — I5043 Acute on chronic combined systolic (congestive) and diastolic (congestive) heart failure: Secondary | ICD-10-CM | POA: Diagnosis not present

## 2019-11-06 DIAGNOSIS — Z950 Presence of cardiac pacemaker: Secondary | ICD-10-CM | POA: Diagnosis not present

## 2019-11-06 DIAGNOSIS — N183 Chronic kidney disease, stage 3 unspecified: Secondary | ICD-10-CM

## 2019-11-06 DIAGNOSIS — M810 Age-related osteoporosis without current pathological fracture: Secondary | ICD-10-CM | POA: Diagnosis not present

## 2019-11-06 DIAGNOSIS — I5033 Acute on chronic diastolic (congestive) heart failure: Secondary | ICD-10-CM

## 2019-11-06 DIAGNOSIS — Z6824 Body mass index (BMI) 24.0-24.9, adult: Secondary | ICD-10-CM | POA: Diagnosis not present

## 2019-11-06 DIAGNOSIS — I495 Sick sinus syndrome: Secondary | ICD-10-CM | POA: Diagnosis not present

## 2019-11-06 DIAGNOSIS — I5023 Acute on chronic systolic (congestive) heart failure: Secondary | ICD-10-CM

## 2019-11-06 LAB — COMPREHENSIVE METABOLIC PANEL
ALT: 15 U/L (ref 0–44)
AST: 28 U/L (ref 15–41)
Albumin: 3.6 g/dL (ref 3.5–5.0)
Alkaline Phosphatase: 68 U/L (ref 38–126)
Anion gap: 13 (ref 5–15)
BUN: 17 mg/dL (ref 8–23)
CO2: 31 mmol/L (ref 22–32)
Calcium: 8.8 mg/dL — ABNORMAL LOW (ref 8.9–10.3)
Chloride: 98 mmol/L (ref 98–111)
Creatinine, Ser: 1.23 mg/dL — ABNORMAL HIGH (ref 0.44–1.00)
GFR calc Af Amer: 45 mL/min — ABNORMAL LOW (ref >60)
GFR calc non Af Amer: 39 mL/min — ABNORMAL LOW (ref >60)
Glucose, Bld: 132 mg/dL — ABNORMAL HIGH (ref 70–99)
Potassium: 3.9 mmol/L (ref 3.5–5.1)
Sodium: 142 mmol/L (ref 135–145)
Total Bilirubin: 0.6 mg/dL (ref 0.3–1.2)
Total Protein: 7.1 g/dL (ref 6.5–8.1)

## 2019-11-06 LAB — BLOOD GAS, VENOUS
Acid-Base Excess: 9.9 mmol/L — ABNORMAL HIGH (ref 0.0–2.0)
Bicarbonate: 36.8 mmol/L — ABNORMAL HIGH (ref 20.0–28.0)
O2 Saturation: 46.1 %
Patient temperature: 37
pCO2, Ven: 58 mmHg (ref 44.0–60.0)
pH, Ven: 7.41 (ref 7.250–7.430)
pO2, Ven: <31 mmHg — CL (ref 32.0–45.0)

## 2019-11-06 LAB — RESPIRATORY PANEL BY RT PCR (FLU A&B, COVID)
Influenza A by PCR: NEGATIVE
Influenza B by PCR: NEGATIVE
SARS Coronavirus 2 by RT PCR: NEGATIVE

## 2019-11-06 LAB — CBC WITH DIFFERENTIAL/PLATELET
Abs Immature Granulocytes: 0.03 10*3/uL (ref 0.00–0.07)
Basophils Absolute: 0 10*3/uL (ref 0.0–0.1)
Basophils Relative: 1 %
Eosinophils Absolute: 0.3 10*3/uL (ref 0.0–0.5)
Eosinophils Relative: 4 %
HCT: 34.8 % — ABNORMAL LOW (ref 36.0–46.0)
Hemoglobin: 10.5 g/dL — ABNORMAL LOW (ref 12.0–15.0)
Immature Granulocytes: 1 %
Lymphocytes Relative: 23 %
Lymphs Abs: 1.4 10*3/uL (ref 0.7–4.0)
MCH: 28.2 pg (ref 26.0–34.0)
MCHC: 30.2 g/dL (ref 30.0–36.0)
MCV: 93.3 fL (ref 80.0–100.0)
Monocytes Absolute: 0.9 10*3/uL (ref 0.1–1.0)
Monocytes Relative: 15 %
Neutro Abs: 3.5 10*3/uL (ref 1.7–7.7)
Neutrophils Relative %: 56 %
Platelets: 282 10*3/uL (ref 150–400)
RBC: 3.73 MIL/uL — ABNORMAL LOW (ref 3.87–5.11)
RDW: 13.7 % (ref 11.5–15.5)
WBC: 6.2 10*3/uL (ref 4.0–10.5)
nRBC: 0 % (ref 0.0–0.2)

## 2019-11-06 LAB — TROPONIN I (HIGH SENSITIVITY)
Troponin I (High Sensitivity): 19 ng/L — ABNORMAL HIGH (ref <18)
Troponin I (High Sensitivity): 21 ng/L — ABNORMAL HIGH (ref <18)

## 2019-11-06 LAB — BRAIN NATRIURETIC PEPTIDE: B Natriuretic Peptide: 291 pg/mL — ABNORMAL HIGH (ref 0.0–100.0)

## 2019-11-06 LAB — LACTIC ACID, PLASMA: Lactic Acid, Venous: 1.9 mmol/L (ref 0.5–1.9)

## 2019-11-06 LAB — HIV ANTIBODY (ROUTINE TESTING W REFLEX): HIV Screen 4th Generation wRfx: NONREACTIVE

## 2019-11-06 MED ORDER — ACETAMINOPHEN 325 MG PO TABS: 650.0000 mg | ORAL_TABLET | ORAL | Status: DC | PRN

## 2019-11-06 MED ORDER — ALBUTEROL SULFATE (2.5 MG/3ML) 0.083% IN NEBU: 2.5000 mg | INHALATION_SOLUTION | Freq: Four times a day (QID) | RESPIRATORY_TRACT | Status: DC | PRN

## 2019-11-06 MED ORDER — IOHEXOL 350 MG/ML SOLN
60.0000 mL | Freq: Once | INTRAVENOUS | Status: AC | PRN
  Administered 2019-11-06: 75 mL via INTRAVENOUS

## 2019-11-06 MED ORDER — APIXABAN 2.5 MG PO TABS
2.5000 mg | ORAL_TABLET | Freq: Two times a day (BID) | ORAL | Status: DC
  Administered 2019-11-06 – 2019-11-08 (×6): 2.5 mg via ORAL
  Filled 2019-11-06 (×7): qty 1

## 2019-11-06 MED ORDER — ALPRAZOLAM 0.25 MG PO TABS: 0.2500 mg | ORAL_TABLET | Freq: Two times a day (BID) | ORAL | Status: DC | PRN

## 2019-11-06 MED ORDER — SODIUM CHLORIDE 0.9% FLUSH: 3.0000 mL | INTRAVENOUS | Status: DC | PRN

## 2019-11-06 MED ORDER — IPRATROPIUM-ALBUTEROL 0.5-2.5 (3) MG/3ML IN SOLN
3.0000 mL | Freq: Four times a day (QID) | RESPIRATORY_TRACT | Status: DC
  Administered 2019-11-06 – 2019-11-08 (×9): 3 mL via RESPIRATORY_TRACT
  Filled 2019-11-06 (×11): qty 3

## 2019-11-06 MED ORDER — FUROSEMIDE 10 MG/ML IJ SOLN
40.0000 mg | Freq: Once | INTRAMUSCULAR | Status: AC
  Administered 2019-11-06: 40 mg via INTRAVENOUS
  Filled 2019-11-06: qty 4

## 2019-11-06 MED ORDER — SODIUM CHLORIDE 0.9% FLUSH
3.0000 mL | Freq: Two times a day (BID) | INTRAVENOUS | Status: DC
  Administered 2019-11-06 – 2019-11-08 (×5): 3 mL via INTRAVENOUS

## 2019-11-06 MED ORDER — BUDESONIDE 0.25 MG/2ML IN SUSP
0.5000 mg | Freq: Two times a day (BID) | RESPIRATORY_TRACT | Status: DC
  Administered 2019-11-06: 0.5 mg via RESPIRATORY_TRACT
  Administered 2019-11-07: 0.25 mg via RESPIRATORY_TRACT
  Administered 2019-11-07 – 2019-11-08 (×2): 0.5 mg via RESPIRATORY_TRACT
  Filled 2019-11-06 (×4): qty 4

## 2019-11-06 MED ORDER — ARFORMOTEROL TARTRATE 15 MCG/2ML IN NEBU
15.0000 ug | INHALATION_SOLUTION | Freq: Two times a day (BID) | RESPIRATORY_TRACT | Status: DC
  Administered 2019-11-06 – 2019-11-08 (×3): 15 ug via RESPIRATORY_TRACT
  Filled 2019-11-06 (×7): qty 2

## 2019-11-06 MED ORDER — FUROSEMIDE 10 MG/ML IJ SOLN
40.0000 mg | Freq: Two times a day (BID) | INTRAMUSCULAR | Status: DC
  Administered 2019-11-06 – 2019-11-07 (×3): 40 mg via INTRAVENOUS
  Filled 2019-11-06 (×3): qty 4

## 2019-11-06 MED ORDER — SODIUM CHLORIDE 0.9 % IV SOLN: 250.0000 mL | INTRAVENOUS | Status: DC | PRN

## 2019-11-06 MED ORDER — METHYLPREDNISOLONE SODIUM SUCC 40 MG IJ SOLR
40.0000 mg | Freq: Four times a day (QID) | INTRAMUSCULAR | Status: AC
  Administered 2019-11-06 (×4): 40 mg via INTRAVENOUS
  Filled 2019-11-06 (×4): qty 1

## 2019-11-06 MED ORDER — ONDANSETRON HCL 4 MG/2ML IJ SOLN: 4.0000 mg | Freq: Four times a day (QID) | INTRAMUSCULAR | Status: DC | PRN

## 2019-11-06 MED ORDER — PREDNISONE 20 MG PO TABS
40.0000 mg | ORAL_TABLET | Freq: Every day | ORAL | Status: DC
  Administered 2019-11-07 – 2019-11-08 (×2): 40 mg via ORAL
  Filled 2019-11-06 (×2): qty 2

## 2019-11-06 MED ORDER — IOHEXOL 350 MG/ML SOLN: 75.0000 mL | Freq: Once | INTRAVENOUS | Status: DC | PRN

## 2019-11-06 NOTE — ED Notes (Signed)
Ready bed @ 1837, patient going to room 149, spoke with RN Morene Rankins

## 2019-11-06 NOTE — H&P (Signed)
History and Physical    Diana Powell DTO:[id number] DOB: Dec 14, 1926 DOA: 11/05/2019  PCP: Layne Benton, MD   Patient coming from: Home  I have personally briefly reviewed patient's old medical records in [place]  Interpreter: [place]  Chief Complaint: Shortness of breath  HPI: Diana Powell is a 86 y.o. Russian-speaking female with medical history significant for A. fib on Eliquis, systolic heart failure, EF 45 to 50% February 2021, advanced COPD, CKD 3, status post recent laparoscopic cholecystectomy on 10/30/2019, who presents to the emergency room with a 3-day history of shortness of breath not responding to home bronchodilator therapy.  Has no chest pain cough fever or chills.  No abdominal pain vomiting or diarrhea and received the Covid vaccine.  She was hypoxic at 88% with EMS improving to the mid 90s on 3 L.  She received DuoNeb in route  ED Course: On arrival to the emergency room she was saturating at 100% on room air.  Temperature 99.7 with otherwise normal vitals.  Troponin 19 symptomatic bradycardia, BNP 291.  Blood work mostly unremarkable.  Creatinine was 1.23 slightly above baseline of 1.07 but within range 83.  Covid and flu test negative.  Given recent surgery she had a CTA chest PE protocol that was negative for PE but showed groundglass airspace disease consistent with edema.  She was treated with IV Lasix and also given duo nebs and IV Solu-Medrol.  Hospitalist consulted for admission.  Review of Systems: As per HPI otherwise 10 point review of systems negative.    Past Medical History:  Diagnosis Date  . Atrial fibrillation ([place])   . CHF (congestive heart failure) ([place])   . Chronic systolic heart failure ([place])   . Complication of anesthesia    CONFUSION   . COPD (chronic obstructive pulmonary disease) ([place])   . Osteoarthritis, multiple sites   . Osteoporosis, post-menopausal   . Presence of permanent cardiac pacemaker     Past Surgical History:    Procedure Laterality Date  . CARDIOVERSION  ~2009  . HIP FRACTURE SURGERY Bilateral [phone]   each hip fractured at separate times  . INSERT / REPLACE / REMOVE PACEMAKER    . IR EXCHANGE BILIARY DRAIN  10/05/2019  . TOTAL HIP ARTHROPLASTY Right 04/20/2018   Procedure: TOTAL HIP CONVERSION;  Surgeon: Hessie Knows, MD;  Location: [facility] ORS;  Service: Orthopedics;  Laterality: Right;     reports that she has never smoked. She has never used smokeless tobacco. She reports previous alcohol use. She reports that she does not use drugs.  No Known Allergies  Family History  Problem Relation Age of Onset  . CAD Mother   . CVA Mother      Prior to Admission medications   Medication Sig Start Date End Date Taking? Authorizing Provider  acetaminophen (TYLENOL) 500 MG tablet Take 2 tablets (1,000 mg total) by mouth every 6 (six) hours as needed for mild pain or moderate pain. Additional  500 mg if needed 10/30/19   Olean Ree, MD  apixaban (ELIQUIS) 5 MG TABS tablet Take 1 tablet (5 mg total) by mouth 2 (two) times daily for 7 days. Patient taking differently: Take 2.5 mg by mouth 2 (two) times daily.  09/05/19 10/25/19  Enzo Bi, MD  budesonide (PULMICORT) 0.5 MG/2ML nebulizer solution Take 0.5 mg by nebulization in the morning and at bedtime.  01/23/18   [provider]  Calcium Carbonate-Vitamin D (CALCIUM-VITAMIN D) 500-200 MG-UNIT per tablet Take 1 tablet by mouth  daily.    [provider]  Disposable Gloves (RA EXTENDED CUFF NITRILE GLOVE) MISC Gloves as indicated for incontinence care 09/12/19   [provider]  ferrous sulfate 325 (65 FE) MG tablet Take 325 mg by mouth at bedtime.     [provider]  formoterol (PERFOROMIST) 20 MCG/2ML nebulizer solution Inhale 20 mcg into the lungs every 12 (twelve) hours. 11/30/17   [provider]  furosemide (LASIX) 40 MG tablet Take 40 mg by mouth 2 (two) times daily. Take additional 40 mg in morning if  needed    [provider]  ibuprofen (ADVIL) 400 MG tablet Take 1 tablet (400 mg total) by mouth every 6 (six) hours as needed for moderate pain. 10/30/19   Henrene Dodge, MD  Incontinence Supply Disposable (DEPEND EASY FIT UNDERGARMENTS) MISC Please provide pull-ups, gloves, and liners. Use as needed 09/12/19   [provider]  Incontinence Supply Disposable (HARMONIE UNDERPAD) MISC Use as needed for incontinence. 09/12/19   [provider]  ipratropium-albuterol (DUONEB) 0.5-2.5 (3) MG/3ML SOLN Inhale 3 mLs into the lungs every 8 (eight) hours as needed. 08/24/19   [provider]  magnesium oxide (MAG-OX) 400 MG tablet Take 400 mg by mouth daily.    [provider]  oxyCODONE (OXY IR/ROXICODONE) 5 MG immediate release tablet Take 1 tablet (5 mg total) by mouth every 6 (six) hours as needed for severe pain. 10/30/19   Piscoya, Elita Quick, MD  potassium chloride (K-DUR) 10 MEQ tablet Take 10 mEq by mouth daily. 02/18/18   [provider]  tetrahydrozoline (EYE DROPS) 0.05 % ophthalmic solution Place 1 drop into both eyes daily as needed (dry eye).    [provider]  vitamin B-12 (CYANOCOBALAMIN) 1000 MCG tablet Take 1,000 mcg by mouth at bedtime.     [provider]    Physical Exam: Vitals:   11/05/19 2345 11/06/19 0010 11/06/19 0019 11/06/19 0130  BP: (!) 155/72   (!) 129/46  Pulse: 70   70  Resp:    20  Temp:  99.7 F (37.6 C)    TempSrc:  Oral    SpO2: 100%   97%  Weight:   54.4 kg   Height:   5' (1.524 m)      Vitals:   11/05/19 2345 11/06/19 0010 11/06/19 0019 11/06/19 0130  BP: (!) 155/72   (!) 129/46  Pulse: 70   70  Resp:    20  Temp:  99.7 F (37.6 C)    TempSrc:  Oral    SpO2: 100%   97%  Weight:   54.4 kg   Height:   5' (1.524 m)     Constitutional: Alert and awake, oriented x3, not in any acute distress. Eyes: PERLA, EOMI, irises appear normal, anicteric sclera,  ENMT: external ears and nose appear  normal, normal hearing             Lips appears normal, oropharynx mucosa, tongue, posterior pharynx appear normal  Neck: neck appears normal, no masses, normal ROM, no thyromegaly, no JVD  CVS: S1-S2 clear, no murmur rubs or gallops,  , no carotid bruits, pedal pulses palpable, No LE edema Respiratory: Diminished bilaterally, few wheezes, few rales. Respiratory effort normal. No accessory muscle use.  Abdomen: soft nontender, nondistended, normal bowel sounds, no hepatosplenomegaly, no hernias, surgical scars from recent cholecystectomy appear healthy Musculoskeletal: : no cyanosis, clubbing , no contractures or atrophy Neuro: Cranial nerves II-XII intact, sensation, reflexes normal, strength Psych: judgement  and insight appear normal, stable mood and affect,  Skin: no rashes or lesions or ulcers, no induration or nodules   Labs on Admission: I have personally reviewed following labs and imaging studies  CBC: Recent Labs  Lab 11/05/19 2349  WBC 6.2  NEUTROABS 3.5  HGB 10.5*  HCT 34.8*  MCV 93.3  PLT 282   Basic Metabolic Panel: Recent Labs  Lab 11/05/19 2349  NA 142  K 3.9  CL 98  CO2 31  GLUCOSE 132*  BUN 17  CREATININE 1.23*  CALCIUM 8.8*   GFR: Estimated Creatinine Clearance: 21.8 mL/min (A) (by C-G formula based on SCr of 1.23 mg/dL (H)). Liver Function Tests: Recent Labs  Lab 11/05/19 2349  AST 28  ALT 15  ALKPHOS 68  BILITOT 0.6  PROT 7.1  ALBUMIN 3.6   No results for input(s): LIPASE, AMYLASE in the last 168 hours. No results for input(s): AMMONIA in the last 168 hours. Coagulation Profile: No results for input(s): INR, PROTIME in the last 168 hours. Cardiac Enzymes: No results for input(s): CKTOTAL, CKMB, CKMBINDEX, TROPONINI in the last 168 hours. BNP (last 3 results) No results for input(s): PROBNP in the last 8760 hours. HbA1C: No results for input(s): HGBA1C in the last 72 hours. CBG: No results for input(s): GLUCAP in the last 168  hours. Lipid Profile: No results for input(s): CHOL, HDL, LDLCALC, TRIG, CHOLHDL, LDLDIRECT in the last 72 hours. Thyroid Function Tests: No results for input(s): TSH, T4TOTAL, FREET4, T3FREE, THYROIDAB in the last 72 hours. Anemia Panel: No results for input(s): VITAMINB12, FOLATE, FERRITIN, TIBC, IRON, RETICCTPCT in the last 72 hours. Urine analysis:    Component Value Date/Time   COLORURINE YELLOW (A) 08/31/2019 2122   APPEARANCEUR HAZY (A) 08/31/2019 2122   LABSPEC 1.030 08/31/2019 2122   PHURINE 6.0 08/31/2019 2122   GLUCOSEU NEGATIVE 08/31/2019 2122   HGBUR SMALL (A) 08/31/2019 2122   BILIRUBINUR NEGATIVE 08/31/2019 2122   KETONESUR NEGATIVE 08/31/2019 2122   PROTEINUR NEGATIVE 08/31/2019 2122   NITRITE NEGATIVE 08/31/2019 2122   LEUKOCYTESUR LARGE (A) 08/31/2019 2122    Radiological Exams on Admission: CT Angio Chest PE W and/or Wo Contrast  Result Date: 11/06/2019 CLINICAL DATA:  Shortness of breath EXAM: CT ANGIOGRAPHY CHEST WITH CONTRAST TECHNIQUE: Multidetector CT imaging of the chest was performed using the standard protocol during bolus administration of intravenous contrast. Multiplanar CT image reconstructions and MIPs were obtained to evaluate the vascular anatomy. CONTRAST:  48mL OMNIPAQUE IOHEXOL 350 MG/ML SOLN COMPARISON:  10/09/2007 FINDINGS: Cardiovascular: No filling defects in the pulmonary arteries to suggest pulmonary emboli. Cardiomegaly. Diffuse aortic calcifications. No evidence of aortic aneurysm. Tortuosity of the thoracic aorta. Mediastinum/Nodes: No mediastinal, hilar, or axillary adenopathy. Trachea and esophagus are unremarkable. Lungs/Pleura: Scattered ground-glass opacities in the lungs may reflect early edema. No confluent opacities. No effusions. Upper Abdomen: Imaging into the upper abdomen shows no acute findings. Musculoskeletal: Chest wall soft tissues are unremarkable. Scoliosis and degenerative changes in the spine. No acute bony abnormality.  Review of the MIP images confirms the above findings. IMPRESSION: No evidence of pulmonary embolus. Cardiomegaly. Scattered ground-glass opacities within the lungs could reflect early edema. Aortic Atherosclerosis (ICD10-I70.0). Electronically Signed   By: Charlett Nose M.D.   On: 11/06/2019 02:46   DG Chest Portable 1 View  Result Date: 11/06/2019 CLINICAL DATA:  Shortness of breath EXAM: PORTABLE CHEST 1 VIEW COMPARISON:  09/04/2019 FINDINGS: Left pacer remains in place, unchanged. Cardiomegaly. No overt edema. No confluent opacities or  effusions. Severe thoracolumbar scoliosis. IMPRESSION: Cardiomegaly.  No active disease. Electronically Signed   By: Charlett Nose M.D.   On: 11/06/2019 00:10    EKG: Independently reviewed.   Assessment/Plan Active Problems:      Acute respiratory failure with hypoxia (HCC) -Secondary to both COPD and CHF exacerbation. -Ruled out for acute PE with CTA chest -Covid and flu test negative -Supplemental oxygen to keep sats over 94% -Treat etiology as outlined below    Acute on chronic systolic CHF (congestive heart failure) (HCC) -IV Lasix -Patient not currently on beta-blockers or ACE inhibitors though followed by cardiology.  Will initiate after more information available -Daily weights, intake and output monitoring -Add echocardiogram February 2021 with EF 45 to 50%    COPD with acute exacerbation (HCC) -Scheduled and as needed nebulized bronchodilator treatments -Budesonide twice daily  AF (paroxysmal atrial fibrillation) (HCC) -Continue Eliquis 2.5 mg twice daily -Not currently on rate control agents per initial review but pending med rec    S/P laparoscopic cholecystectomy 10/30/19 -Patient underwent uneventful laparoscopic cholecystectomy on 10/30/2019.  She had previous cardiac clearance -No acute concerns  CKD 3 -At baseline renal function     DVT prophylaxis: On Eliquis Code Status: full code, communicated through interpreter Family  Communication:  none  Disposition Plan: Back to previous home environment Consults called: none  Status:inp    Andris Baumann MD [facility]     11/06/2019, 3:26 AM

## 2019-11-06 NOTE — ED Provider Notes (Signed)
[facility] Emergency Department Provider Note  ____________________________________________  Time seen: Approximately 1:30 AM  I have reviewed the triage vital signs and the nursing notes.   HISTORY  Chief Complaint Shortness of Breath   HPI Diana Powell is a 86 y.o. female with a history of atrial fibrillation on Eliquis, CHF with a EF of 45 to 50%, COPD, sick sinus syndrome status post pacemaker, provoked DVT, now POD 7 from lap chole who presents for evaluation of SOB. History gathered from son and via interpreter from patient as she is [place] speaking only. Patient has been having worsening SOB for the last 3 days. Was off of Eliquis for several days and re-started it 3 days ago. Never had a PE. Had DVT once after hip surgery. Has been using her inhalers which was helping until tonight.  Found to be hypoxic at 88% per EMS which improved on 3 L.  Received 1 DuoNeb in route.  Patient denies chest pain, fever, cough, abdominal pain, vomiting or diarrhea.  She has received both Covid vaccines with the second 110 days ago.  No known exposures to Covid.   Past Medical History:  Diagnosis Date  . Atrial fibrillation (HCC)   . CHF (congestive heart failure) (HCC)   . Chronic systolic heart failure (HCC)   . Complication of anesthesia    CONFUSION   . COPD (chronic obstructive pulmonary disease) (HCC)   . Osteoarthritis, multiple sites   . Osteoporosis, post-menopausal   . Presence of permanent cardiac pacemaker     Patient Active Problem List   Diagnosis Date Noted  . Acute hypoxemic respiratory failure (HCC)   . Acute kidney injury superimposed on CKD (HCC)   . Acute cholecystitis 08/31/2019  . RUQ pain   . Chronic obstructive pulmonary disease (HCC)   . Acute DVT (deep venous thrombosis) (HCC) 04/29/2018  . 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 AF  (paroxysmal atrial fibrillation) (HCC)   . Chronic systolic CHF (congestive heart failure) (HCC)   . Osteoporosis, post-menopausal   . Osteoarthritis, multiple sites     Past Surgical History:  Procedure Laterality Date  . CARDIOVERSION  ~2009  . HIP FRACTURE SURGERY Bilateral [phone]   each hip fractured at separate times  . INSERT / REPLACE / REMOVE PACEMAKER    . IR EXCHANGE BILIARY DRAIN  10/05/2019  . TOTAL HIP ARTHROPLASTY Right 04/20/2018   Procedure: TOTAL HIP CONVERSION;  Surgeon: Kennedy Bucker, MD;  Location: [facility] ORS;  Service: Orthopedics;  Laterality: Right;    Prior to Admission medications   Medication Sig Start Date End Date Taking? Authorizing Provider  acetaminophen (TYLENOL) 500 MG tablet Take 2 tablets (1,000 mg total) by mouth every 6 (six) hours as needed for mild pain or moderate pain. Additional  500 mg if needed 10/30/19   Henrene Dodge, MD  apixaban (ELIQUIS) 5 MG TABS tablet Take 1 tablet (5 mg total) by mouth 2 (two) times daily for 7 days. Patient taking differently: Take 2.5 mg by mouth 2 (two) times daily.  09/05/19 10/25/19  Darlin Priestly, MD  budesonide (PULMICORT) 0.5 MG/2ML nebulizer solution Take 0.5 mg by nebulization in the morning and at bedtime.  01/23/18   [provider]  Calcium Carbonate-Vitamin D (CALCIUM-VITAMIN D) 500-200 MG-UNIT per tablet Take 1 tablet by mouth daily.    [provider]  Disposable Gloves (RA EXTENDED CUFF NITRILE GLOVE) MISC  Gloves as indicated for incontinence care 09/12/19   [provider]  ferrous sulfate 325 (65 FE) MG tablet Take 325 mg by mouth at bedtime.     [provider]  formoterol (PERFOROMIST) 20 MCG/2ML nebulizer solution Inhale 20 mcg into the lungs every 12 (twelve) hours. 11/30/17   [provider]  furosemide (LASIX) 40 MG tablet Take 40 mg by mouth 2 (two) times daily. Take additional 40 mg in morning if needed    [provider]  ibuprofen (ADVIL) 400 MG  tablet Take 1 tablet (400 mg total) by mouth every 6 (six) hours as needed for moderate pain. 10/30/19   Henrene DodgePiscoya, Jose, MD  Incontinence Supply Disposable (DEPEND EASY FIT UNDERGARMENTS) MISC Please provide pull-ups, gloves, and liners. Use as needed 09/12/19   [provider]  Incontinence Supply Disposable (HARMONIE UNDERPAD) MISC Use as needed for incontinence. 09/12/19   [provider]  ipratropium-albuterol (DUONEB) 0.5-2.5 (3) MG/3ML SOLN Inhale 3 mLs into the lungs every 8 (eight) hours as needed. 08/24/19   [provider]  magnesium oxide (MAG-OX) 400 MG tablet Take 400 mg by mouth daily.    [provider]  oxyCODONE (OXY IR/ROXICODONE) 5 MG immediate release tablet Take 1 tablet (5 mg total) by mouth every 6 (six) hours as needed for severe pain. 10/30/19   Piscoya, Elita QuickJose, MD  potassium chloride (K-DUR) 10 MEQ tablet Take 10 mEq by mouth daily. 02/18/18   [provider]  tetrahydrozoline (EYE DROPS) 0.05 % ophthalmic solution Place 1 drop into both eyes daily as needed (dry eye).    [provider]  vitamin B-12 (CYANOCOBALAMIN) 1000 MCG tablet Take 1,000 mcg by mouth at bedtime.     [provider]    Allergies Patient has no known allergies.  Family History  Problem Relation Age of Onset  . CAD Mother   . CVA Mother     Social History Social History   Tobacco Use  . Smoking status: Never Smoker  . Smokeless tobacco: Never Used  Substance Use Topics  . Alcohol use: Not Currently  . Drug use: No    Review of Systems  Constitutional: Negative for fever. Eyes: Negative for visual changes. ENT: Negative for sore throat. Neck: No neck pain  Cardiovascular: Negative for chest pain. Respiratory: + shortness of breath. Gastrointestinal: Negative for abdominal pain, vomiting or diarrhea. Genitourinary: Negative for dysuria. Musculoskeletal: Negative for back pain. Skin: Negative for rash. Neurological: Negative for  headaches, weakness or numbness. Psych: No SI or HI  ____________________________________________   PHYSICAL EXAM:  VITAL SIGNS: ED Triage Vitals  Enc Vitals Group     BP 11/05/19 2345 (!) 155/72     Pulse Rate 11/05/19 2345 70     Resp --      Temp 11/06/19 0010 99.7 F (37.6 C)     Temp Source 11/06/19 0010 Oral     SpO2 11/05/19 2345 100 %     Weight 11/06/19 0019 120 lb (54.4 kg)     Height 11/06/19 0019 5' (1.524 m)     Head Circumference --      Peak Flow --      Pain Score 11/06/19 0019 0     Pain Loc --      Pain Edu? --      Excl. in GC? --     Constitutional: Alert and oriented, mild respiratory distress.  HEENT:      Head: Normocephalic and atraumatic.  Eyes: Conjunctivae are normal. Sclera is non-icteric.       Mouth/Throat: Mucous membranes are moist.       Neck: Supple with no signs of meningismus. Cardiovascular: Regular rate and rhythm. No murmurs, gallops, or rubs. 2+ symmetrical distal pulses are present in all extremities. No JVD. Respiratory: Increased work of breathing, hypoxic to 88% on room air with severely diminished air movement bilaterally and crackles. Gastrointestinal: Soft, non tender, and non distended with well-healing surgical scars Musculoskeletal: No edema, cyanosis, or erythema of extremities. Neurologic: Normal speech and language. Face is symmetric. Moving all extremities. No gross focal neurologic deficits are appreciated. Skin: Skin is warm, dry and intact. No rash noted. Psychiatric: Mood and affect are normal. Speech and behavior are normal.  ____________________________________________   LABS (all labs ordered are listed, but only abnormal results are displayed)  Labs Reviewed  CBC WITH DIFFERENTIAL/PLATELET - Abnormal; Notable for the following components:      Result Value   RBC 3.73 (*)    Hemoglobin 10.5 (*)    HCT 34.8 (*)    All other components within normal limits  COMPREHENSIVE METABOLIC PANEL - Abnormal;  Notable for the following components:   Glucose, Bld 132 (*)    Creatinine, Ser 1.23 (*)    Calcium 8.8 (*)    GFR calc non Af Amer 39 (*)    GFR calc Af Amer 45 (*)    All other components within normal limits  BRAIN NATRIURETIC PEPTIDE - Abnormal; Notable for the following components:   B Natriuretic Peptide 291.0 (*)    All other components within normal limits  BLOOD GAS, VENOUS - Abnormal; Notable for the following components:   pO2, Ven <31.0 (*)    Bicarbonate 36.8 (*)    Acid-Base Excess 9.9 (*)    All other components within normal limits  TROPONIN I (HIGH SENSITIVITY) - Abnormal; Notable for the following components:   Troponin I (High Sensitivity) 21 (*)    All other components within normal limits  TROPONIN I (HIGH SENSITIVITY) - Abnormal; Notable for the following components:   Troponin I (High Sensitivity) 19 (*)    All other components within normal limits  RESPIRATORY PANEL BY RT PCR (FLU A&B, COVID)  CULTURE, BLOOD (ROUTINE X 2)  CULTURE, BLOOD (ROUTINE X 2)  LACTIC ACID, PLASMA   ____________________________________________  EKG  ED ECG REPORT I, Nita Sickle, the attending physician, personally viewed and interpreted this ECG.  Normal sinus rhythm, rate of 87, normal intervals, no STE or depressions.  ____________________________________________  RADIOLOGY  I have personally reviewed the images performed during this visit and I agree with the Radiologist's read.   Interpretation by Radiologist:  CT Angio Chest PE W and/or Wo Contrast  Result Date: 11/06/2019 CLINICAL DATA:  Shortness of breath EXAM: CT ANGIOGRAPHY CHEST WITH CONTRAST TECHNIQUE: Multidetector CT imaging of the chest was performed using the standard protocol during bolus administration of intravenous contrast. Multiplanar CT image reconstructions and MIPs were obtained to evaluate the vascular anatomy. CONTRAST:  27mL OMNIPAQUE IOHEXOL 350 MG/ML SOLN COMPARISON:  10/09/2007 FINDINGS:  Cardiovascular: No filling defects in the pulmonary arteries to suggest pulmonary emboli. Cardiomegaly. Diffuse aortic calcifications. No evidence of aortic aneurysm. Tortuosity of the thoracic aorta. Mediastinum/Nodes: No mediastinal, hilar, or axillary adenopathy. Trachea and esophagus are unremarkable. Lungs/Pleura: Scattered ground-glass opacities in the lungs may reflect early edema. No confluent opacities. No effusions. Upper Abdomen: Imaging into the upper abdomen shows no acute findings. Musculoskeletal: Chest wall soft  tissues are unremarkable. Scoliosis and degenerative changes in the spine. No acute bony abnormality. Review of the MIP images confirms the above findings. IMPRESSION: No evidence of pulmonary embolus. Cardiomegaly. Scattered ground-glass opacities within the lungs could reflect early edema. Aortic Atherosclerosis (ICD10-I70.0). Electronically Signed   By: Rolm Baptise M.D.   On: 11/06/2019 02:46   DG Chest Portable 1 View  Result Date: 11/06/2019 CLINICAL DATA:  Shortness of breath EXAM: PORTABLE CHEST 1 VIEW COMPARISON:  09/04/2019 FINDINGS: Left pacer remains in place, unchanged. Cardiomegaly. No overt edema. No confluent opacities or effusions. Severe thoracolumbar scoliosis. IMPRESSION: Cardiomegaly.  No active disease. Electronically Signed   By: Rolm Baptise M.D.   On: 11/06/2019 00:10     ____________________________________________   PROCEDURES  Procedure(s) performed:yes .1-3 Lead EKG Interpretation Performed by: Rudene Re, MD Authorized by: Rudene Re, MD     Interpretation: normal     ECG rate:  80   ECG rate assessment: normal     Rhythm: sinus rhythm     Ectopy: none     Conduction: normal     Critical Care performed: yes  CRITICAL CARE Performed by: Rudene Re  ?  Total critical care time: 40 min  Critical care time was exclusive of separately billable procedures and treating other patients.  Critical care was  necessary to treat or prevent imminent or life-threatening deterioration.  Critical care was time spent personally by me on the following activities: development of treatment plan with patient and/or surrogate as well as nursing, discussions with consultants, evaluation of patient's response to treatment, examination of patient, obtaining history from patient or surrogate, ordering and performing treatments and interventions, ordering and review of laboratory studies, ordering and review of radiographic studies, pulse oximetry and re-evaluation of patient's condition. ____________________________________________   INITIAL IMPRESSION / ASSESSMENT AND PLAN / ED COURSE  History gathered from interpreter and son who was at bedside.  86 y.o. female with a history of atrial fibrillation on Eliquis, CHF with a EF of 45 to 50%, COPD, sick sinus syndrome status post pacemaker, provoked DVT, now POD 7 from lap chole who presents for evaluation of SOB x 3 days.  Patient arrives in mild respiratory distress, hypoxic, severely diminished air movement bilaterally and crackles.  Low-grade temp of 99.35F.  Differential diagnoses includes COPD exacerbation versus bronchitis versus pneumonia versus PE versus CHF exacerbation versus COVID versus Flu.  Patient started on solumedrol and duonebs x 3 with improvement of WOB and normalization of sats. CXR visualized by me showing no evidence of pneumonia, pneumothorax, pleural effusions, or edema.  CT is pending to rule out PE since patient is recently postop and had been off her Eliquis for several days.  Patient is otherwise afebrile with normal white count, normal lactic acid.  VBG showing no CO2 retention and normal pH although that was done after several breathing treatments.  Stable mild anemia.  No significant electrolyte abnormalities and stable kidney function.  BNP slightly elevated at 291 and troponin of 21.  Repeat troponin is pending.  Patient continues to be  hypoxic on room air.  _________________________ 2:59 AM on 11/06/2019 -----------------------------------------  CTA negative for PE showing edema. Will diurese and admit to Hospitalist for acute hypoxic respiratory failure from COPD and CHF exacerbation. Covid and flu negative. Patient and her son have been updated.      _____________________________________________ Please note:  Patient was evaluated in Emergency Department today for the symptoms described in the history of present illness. Patient was  evaluated in the context of the global COVID-19 pandemic, which necessitated consideration that the patient might be at risk for infection with the SARS-CoV-2 virus that causes COVID-19. Institutional protocols and algorithms that pertain to the evaluation of patients at risk for COVID-19 are in a state of rapid change based on information released by regulatory bodies including the CDC and federal and state organizations. These policies and algorithms were followed during the patient's care in the ED.  Some ED evaluations and interventions may be delayed as a result of limited staffing during the pandemic.   [place] Controlled Substance Database was reviewed by me. ____________________________________________   FINAL CLINICAL IMPRESSION(S) / ED DIAGNOSES   Final diagnoses:  Acute respiratory failure with hypoxia (HCC)  Acute on chronic congestive heart failure, unspecified heart failure type (HCC)  COPD exacerbation (HCC)      NEW MEDICATIONS STARTED DURING THIS VISIT:  ED Discharge Orders    None       Note:  This document was prepared using Dragon voice recognition software and may include unintentional dictation errors.    Don Perking, [place], MD 11/06/19 [phone]

## 2019-11-06 NOTE — Progress Notes (Signed)
PROGRESS NOTE                                                                                                                                                                                                             Patient Demographics:    Diana Powell, is a 86 y.o. female, DOB - [birth year], BJS:[id number]  Admit date - 11/05/2019   Admitting Physician Andris Baumann, MD  Outpatient Primary MD for the patient is Lollie Marrow, MD  LOS - 0    Chief Complaint  Patient presents with  . Shortness of Breath       Brief Narrative Patient admitted early this morning.  Admission H&P reviewed in detail.  86 year old Russian-speaking female with history of A. fib on Eliquis, systolic CHF with EF of 40-45%, advanced COPD, CKD stage III, recent laparoscopic cholecystectomy on 4/13 and discharged home 2 days later presented to the ED with 3-day history of increasing shortness of breath not relieved with home bronchodilator therapy.  No chest pain fever or chills. Patient was hypoxic at 88% per EMS and required 3 L via nasal cannula.  Patient admitted with COPD with acute exacerbation and acute on chronic systolic CHF.     Subjective:   Patient seen and examined with the help of [place] interpreter.  Reports breathing better since admission   Assessment  & Plan :    Principal Problem:   Acute respiratory failure with hypoxia (HCC) Secondary to combination of acute on chronic systolic CHF and COPD exacerbation.  CT angiogram chest negative for PE.  Covid PCR negative.  (Has been vaccinated recently). Currently requiring 2 L via nasal cannula, wean as tolerated.  Active Problems: COPD with acute exacerbation. Continue scheduled and as needed nebs.  Budesonide twice daily.  Received IV Solu-Medrol and now transition to oral prednisone.  Acute on chronic systolic CHF (HCC) On IV Lasix 40 mg twice daily.  Strict I's/O and  daily weight.      AF (paroxysmal atrial fibrillation) (HCC) Rate controlled.  Continue Eliquis.    S/P laparoscopic cholecystectomy 10/30/19 Laparoscopic site appears clean and nontender.       CKD (chronic kidney disease) stage 3, GFR 30-59 ml/min Stable at baseline.      Code Status : Full code  Family Communication  : We will update her son  Disposition Plan  : Home possibly in the next 24-48 hours depend upon clinical improvement  Barriers For Discharge : Acute CHF and COPD exacerbation  Consults  : None  Procedures  : None  DVT Prophylaxis  : Eliquis  Lab Results  Component Value Date   PLT 282 11/05/2019    Antibiotics  :    Anti-infectives (From admission, onward)   None        Objective:   Vitals:   11/06/19 0800 11/06/19 0900 11/06/19 1000 11/06/19 1230  BP:    140/61  Pulse: 70 71 70 70  Resp: (!) 26 (!) 22 (!) 23 (!) 24  Temp:  (!) 96 F (35.6 C)    TempSrc:  Axillary    SpO2: 100% 99% 94% 100%  Weight:      Height:        Wt Readings from Last 3 Encounters:  11/06/19 54.4 kg  10/25/19 54 kg  10/24/19 54.9 kg     Intake/Output Summary (Last 24 hours) at 11/06/2019 1329 Last data filed at 11/06/2019 0730 Gross per 24 hour  Intake --  Output 400 ml  Net -400 ml     Physical Exam  Gen: not in distress HEENT:moist mucosa, supple neck Chest: Fine bibasilar crackles CVS: Normal S1-S2, no murmurs GI: soft, NT, ND,   Musculoskeletal: warm, trace pedal edema    Data Review:    CBC Recent Labs  Lab 11/05/19 2349  WBC 6.2  HGB 10.5*  HCT 34.8*  PLT 282  MCV 93.3  MCH 28.2  MCHC 30.2  RDW 13.7  LYMPHSABS 1.4  MONOABS 0.9  EOSABS 0.3  BASOSABS 0.0    Chemistries  Recent Labs  Lab 11/05/19 2349  NA 142  K 3.9  CL 98  CO2 31  GLUCOSE 132*  BUN 17  CREATININE 1.23*  CALCIUM 8.8*  AST 28  ALT 15  ALKPHOS 68  BILITOT 0.6    ------------------------------------------------------------------------------------------------------------------ No results for input(s): CHOL, HDL, LDLCALC, TRIG, CHOLHDL, LDLDIRECT in the last 72 hours.  No results found for: HGBA1C ------------------------------------------------------------------------------------------------------------------ No results for input(s): TSH, T4TOTAL, T3FREE, THYROIDAB in the last 72 hours.  Invalid input(s): FREET3 ------------------------------------------------------------------------------------------------------------------ No results for input(s): VITAMINB12, FOLATE, FERRITIN, TIBC, IRON, RETICCTPCT in the last 72 hours.  Coagulation profile No results for input(s): INR, PROTIME in the last 168 hours.  No results for input(s): DDIMER in the last 72 hours.  Cardiac Enzymes No results for input(s): CKMB, TROPONINI, MYOGLOBIN in the last 168 hours.  Invalid input(s): CK ------------------------------------------------------------------------------------------------------------------    Component Value Date/Time   BNP 291.0 (H) 11/05/2019 2349    Inpatient Medications  Scheduled Meds: . apixaban  2.5 mg Oral BID  . arformoterol  15 mcg Nebulization Q12H  . budesonide  0.5 mg Nebulization BID  . furosemide  40 mg Intravenous BID  . ipratropium-albuterol  3 mL Nebulization Q6H  . methylPREDNISolone (SOLU-MEDROL) injection  40 mg Intravenous Q6H   Followed by  . [START ON 11/07/2019] predniSONE  40 mg Oral Q breakfast  . sodium chloride flush  3 mL Intravenous Q12H   Continuous Infusions: . sodium chloride     PRN Meds:.sodium chloride, acetaminophen, albuterol, ALPRAZolam, sodium chloride flush  Micro Results Recent Results (from the past 240 hour(s))  Blood culture (routine x 2)     Status: None (Preliminary result)   Collection Time: 11/06/19 12:04 AM   Specimen: BLOOD  Result Value Ref Range Status   Specimen Description  BLOOD  LFOA  Final   Special Requests BOTTLES DRAWN AEROBIC AND ANAEROBIC BCLV  Final   Culture   Final    NO GROWTH < 12 HOURS Performed at [facility], [address] [place], [place] [address]    Report Status PENDING  Incomplete  Blood culture (routine x 2)     Status: None (Preliminary result)   Collection Time: 11/06/19 12:04 AM   Specimen: BLOOD  Result Value Ref Range Status   Specimen Description BLOOD RAC  Final   Special Requests BOTTLES DRAWN AEROBIC AND ANAEROBIC BCAV  Final   Culture   Final    NO GROWTH < 12 HOURS Performed at [facility], [address], [place], [place] [address]    Report Status PENDING  Incomplete  Respiratory Panel by RT PCR (Flu A&B, Covid) - Nasopharyngeal Swab     Status: None   Collection Time: 11/06/19 12:58 AM   Specimen: Nasopharyngeal Swab  Result Value Ref Range Status   SARS Coronavirus 2 by RT PCR NEGATIVE NEGATIVE Final    Comment: (NOTE) SARS-CoV-2 target nucleic acids are NOT DETECTED. The SARS-CoV-2 RNA is generally detectable in upper respiratoy specimens during the acute phase of infection. The lowest concentration of SARS-CoV-2 viral copies this assay can detect is 131 copies/mL. A negative result does not preclude SARS-Cov-2 infection and should not be used as the sole basis for treatment or other patient management decisions. A negative result may occur with  improper specimen collection/handling, submission of specimen other than nasopharyngeal swab, presence of viral mutation(s) within the areas targeted by this assay, and inadequate number of viral copies (<131 copies/mL). A negative result must be combined with clinical observations, patient history, and epidemiological information. The expected result is Negative. Fact Sheet for Patients:  PinkCheek.be Fact Sheet for Healthcare Providers:  GravelBags.it This test is not yet ap proved or  cleared by the [place] FDA and  has been authorized for detection and/or diagnosis of SARS-CoV-2 by FDA under an Emergency Use Authorization (EUA). This EUA will remain  in effect (meaning this test can be used) for the duration of the COVID-19 declaration under Section 564(b)(1) of the Act, 21 U.S.C. section 360bbb-3(b)(1), unless the authorization is terminated or revoked sooner.    Influenza A by PCR NEGATIVE NEGATIVE Final   Influenza B by PCR NEGATIVE NEGATIVE Final    Comment: (NOTE) The Xpert Xpress SARS-CoV-2/FLU/RSV assay is intended as an aid in  the diagnosis of influenza from Nasopharyngeal swab specimens and  should not be used as a sole basis for treatment. Nasal washings and  aspirates are unacceptable for Xpert Xpress SARS-CoV-2/FLU/RSV  testing. Fact Sheet for Patients: PinkCheek.be Fact Sheet for Healthcare Providers: GravelBags.it This test is not yet approved or cleared by the [place] FDA and  has been authorized for detection and/or diagnosis of SARS-CoV-2 by  FDA under an Emergency Use Authorization (EUA). This EUA will remain  in effect (meaning this test can be used) for the duration of the  Covid-19 declaration under Section 564(b)(1) of the Act, 21  U.S.C. section 360bbb-3(b)(1), unless the authorization is  terminated or revoked. Performed at [facility], [address], [place],  [address]     Radiology Reports CT Angio Chest PE W and/or Wo Contrast  Result Date: 11/06/2019 CLINICAL DATA:  Shortness of breath EXAM: CT ANGIOGRAPHY CHEST WITH CONTRAST TECHNIQUE: Multidetector CT imaging of the chest was performed using the standard protocol during bolus administration of intravenous  contrast. Multiplanar CT image reconstructions and MIPs were obtained to evaluate the vascular anatomy. CONTRAST:  51mL OMNIPAQUE IOHEXOL 350 MG/ML SOLN COMPARISON:  10/09/2007 FINDINGS:  Cardiovascular: No filling defects in the pulmonary arteries to suggest pulmonary emboli. Cardiomegaly. Diffuse aortic calcifications. No evidence of aortic aneurysm. Tortuosity of the thoracic aorta. Mediastinum/Nodes: No mediastinal, hilar, or axillary adenopathy. Trachea and esophagus are unremarkable. Lungs/Pleura: Scattered ground-glass opacities in the lungs may reflect early edema. No confluent opacities. No effusions. Upper Abdomen: Imaging into the upper abdomen shows no acute findings. Musculoskeletal: Chest wall soft tissues are unremarkable. Scoliosis and degenerative changes in the spine. No acute bony abnormality. Review of the MIP images confirms the above findings. IMPRESSION: No evidence of pulmonary embolus. Cardiomegaly. Scattered ground-glass opacities within the lungs could reflect early edema. Aortic Atherosclerosis (ICD10-I70.0). Electronically Signed   By: Charlett Nose M.D.   On: 11/06/2019 02:46   DG Chest Portable 1 View  Result Date: 11/06/2019 CLINICAL DATA:  Shortness of breath EXAM: PORTABLE CHEST 1 VIEW COMPARISON:  09/04/2019 FINDINGS: Left pacer remains in place, unchanged. Cardiomegaly. No overt edema. No confluent opacities or effusions. Severe thoracolumbar scoliosis. IMPRESSION: Cardiomegaly.  No active disease. Electronically Signed   By: Charlett Nose M.D.   On: 11/06/2019 00:10    Time Spent in minutes 20   Delainey Winstanley M.D on 11/06/2019 at 1:29 PM  Between 7am to 7pm - Pager - [phone]  After 7pm go to [email or web address] - password [facility]  [facility] -  Office  [phone]

## 2019-11-06 NOTE — ED Notes (Signed)
Pt placed on purwick and repositioned in bed at this time

## 2019-11-06 NOTE — ED Notes (Signed)
Patient given dinner tray and is eating at this time. Son at bedside. Patient given warm blanket. Patient refused Purewick so will bring bedside commode and portable O2 tank.

## 2019-11-06 NOTE — Progress Notes (Signed)
PT Cancellation Note  Patient Details Name: Diana Powell MRN: [id number] DOB: Dec 11, 1926   Cancelled Treatment:    Reason Eval/Treat Not Completed: (Consult received and chart reviewed.  Patient currently in ER with plans for admission to progressive cardiac unit.  Will hold eval until patient arrived on floor and respiratory status appropriate for introduction of activity.)   Hershel Corkery H. Manson Passey, PT, DPT, NCS 11/06/19, 11:45 AM [phone]

## 2019-11-06 NOTE — ED Notes (Signed)
Report received from [place], [place]. Pt placed in ED stretcher at this time, locked and in lowest position. Son at bedside. Pt is Russian-speaking. Denies any complaints at this time. Pt A&Ox4 and NAD at this time. Call light within reach.

## 2019-11-06 NOTE — ED Notes (Signed)
Pt given lunch meal tray. 

## 2019-11-06 NOTE — TOC Initial Note (Signed)
Transition of Care [facility]) - Initial/Assessment Note    Patient Details  Name: Diana Powell MRN: [id number] Date of Birth: [birth year]  [facility]) CM/SW Contact:    Amsterdam Cellar, RN Phone Number: 11/06/2019, 10:32 AM  Clinical Narrative:                 Spoke to son, Trinna Post, who is primary caregiver for patient. Patient lives with son and daughter in law who provide all needed care. Walks with walker or cane depending on the day and has participated in OP PT off and on for the last 2 years. Son is concerned over possible need for home O2. RN CM discussed in detail that MD would monitor patient and order home O2 if needed prior to discharge. No other needs or concerns at this time and son confirmed patient would return home with him and family is not interested in SNF placement.   Expected Discharge Plan: Home/Self Care     Patient Goals and CMS Choice Patient states their goals for this hospitalization and ongoing recovery are:: Return home with son and family      Expected Discharge Plan and Services Expected Discharge Plan: Home/Self Care In-house Referral: Clinical Social Work     Living arrangements for the past 2 months: Single Family Home                                      Prior Living Arrangements/Services Living arrangements for the past 2 months: Single Family Home Lives with:: Adult Children(Lives with son and DIL) Patient language and need for interpreter reviewed:: Yes Do you feel safe going back to the place where you live?: Yes      Need for Family Participation in Patient Care: Yes (Comment) Care giver support system in place?: Yes (comment) Current home services: DME(walker, [facility]) Criminal Activity/Legal Involvement Pertinent to Current Situation/Hospitalization: No - Comment as needed  Activities of Daily Living      Permission Sought/Granted Permission sought to share information with : Case Manager Permission granted to share  information with : Yes, Verbal Permission Granted  Share Information with NAME: TOC Dept           Emotional Assessment Appearance:: Appears stated age Attitude/Demeanor/Rapport: Unable to Assess Affect (typically observed): Unable to Assess Orientation: : Oriented to Self, Oriented to Place, Oriented to  Time, Oriented to Situation Alcohol / Substance Use: Never Used Psych Involvement: No (comment)  Admission diagnosis:  CHF (congestive heart failure) (HCC) [I50.9] Patient Active Problem List   Diagnosis Date Noted  . COPD with acute exacerbation (HCC) 11/06/2019  . S/P laparoscopic cholecystectomy 10/30/19 11/06/2019  . CHF (congestive heart failure) (HCC) 11/06/2019  . CKD (chronic kidney disease) stage 3, GFR 30-59 ml/min 11/06/2019  . Acute on chronic systolic CHF (congestive heart failure) (HCC) 11/06/2019  . Acute respiratory failure with hypoxia (HCC)   . Acute kidney injury superimposed on CKD (HCC)   . Acute cholecystitis 08/31/2019  . RUQ pain   . Chronic obstructive pulmonary disease (HCC)   . Acute DVT (deep venous thrombosis) (HCC) 04/29/2018  . 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 AF (paroxysmal atrial fibrillation) (HCC)   . Chronic systolic CHF (congestive heart failure) (HCC)   . Osteoporosis, post-menopausal   . Osteoarthritis, multiple sites    PCP:  Layne Benton, MD Pharmacy:   [id number] [address], [place] [address] Phone: [phone] Fax: [phone]  [place], [place] - [address] [place] [address] Phone: [phone] Fax: [phone]     Social Determinants of Health (SDOH) Interventions    Readmission Risk Interventions No flowsheet data found.

## 2019-11-06 NOTE — Progress Notes (Signed)
OT Cancellation Note  Patient Details Name: Diana Powell MRN: [id number] DOB: 1926/11/30   Cancelled Treatment:    Reason Eval/Treat Not Completed: Patient at procedure or test/ unavailable  OT consult received and chart reviewed. Pt transferring rooms at this time. Will f/u as able. Thank you.  Rejeana Brock, MS, OTR/L ascom [phone] 11/06/19, 12:50 PM

## 2019-11-07 DIAGNOSIS — I509 Heart failure, unspecified: Secondary | ICD-10-CM | POA: Diagnosis not present

## 2019-11-07 DIAGNOSIS — J9601 Acute respiratory failure with hypoxia: Secondary | ICD-10-CM | POA: Diagnosis not present

## 2019-11-07 DIAGNOSIS — I130 Hypertensive heart and chronic kidney disease with heart failure and stage 1 through stage 4 chronic kidney disease, or unspecified chronic kidney disease: Secondary | ICD-10-CM | POA: Diagnosis not present

## 2019-11-07 LAB — BASIC METABOLIC PANEL
Anion gap: 10 (ref 5–15)
BUN: 22 mg/dL (ref 8–23)
CO2: 33 mmol/L — ABNORMAL HIGH (ref 22–32)
Calcium: 8.1 mg/dL — ABNORMAL LOW (ref 8.9–10.3)
Chloride: 99 mmol/L (ref 98–111)
Creatinine, Ser: 0.91 mg/dL (ref 0.44–1.00)
GFR calc Af Amer: >60 mL/min (ref >60)
GFR calc non Af Amer: 56 mL/min — ABNORMAL LOW (ref >60)
Glucose, Bld: 188 mg/dL — ABNORMAL HIGH (ref 70–99)
Potassium: 3.4 mmol/L — ABNORMAL LOW (ref 3.5–5.1)
Sodium: 142 mmol/L (ref 135–145)

## 2019-11-07 LAB — CBC
HCT: 29.4 % — ABNORMAL LOW (ref 36.0–46.0)
Hemoglobin: 8.9 g/dL — ABNORMAL LOW (ref 12.0–15.0)
MCH: 28.1 pg (ref 26.0–34.0)
MCHC: 30.3 g/dL (ref 30.0–36.0)
MCV: 92.7 fL (ref 80.0–100.0)
Platelets: 245 10*3/uL (ref 150–400)
RBC: 3.17 MIL/uL — ABNORMAL LOW (ref 3.87–5.11)
RDW: 13.6 % (ref 11.5–15.5)
WBC: 5 10*3/uL (ref 4.0–10.5)
nRBC: 0 % (ref 0.0–0.2)

## 2019-11-07 MED ORDER — FUROSEMIDE 10 MG/ML IJ SOLN
60.0000 mg | Freq: Two times a day (BID) | INTRAMUSCULAR | Status: DC
  Administered 2019-11-07: 60 mg via INTRAVENOUS
  Filled 2019-11-07: qty 8

## 2019-11-07 MED ORDER — ADULT MULTIVITAMIN W/MINERALS CH
1.0000 | ORAL_TABLET | Freq: Every day | ORAL | Status: DC
  Administered 2019-11-08: 1 via ORAL
  Filled 2019-11-07: qty 1

## 2019-11-07 MED ORDER — POTASSIUM CHLORIDE CRYS ER 20 MEQ PO TBCR
40.0000 meq | EXTENDED_RELEASE_TABLET | Freq: Once | ORAL | Status: AC
  Administered 2019-11-07: 40 meq via ORAL
  Filled 2019-11-07: qty 2

## 2019-11-07 NOTE — Evaluation (Signed)
Occupational Therapy Evaluation Patient Details Name: Diana Powell MRN: [id number] DOB: 1926-08-27 Today's Date: 11/07/2019    History of Present Illness Rami Waddle is a 86 y/o F with PMH: afib (on Eliquis), COPD (not on home O2), OA, PPM, R THA, L wrist fracture d/t fall, and recent hospitalization in February d/t cholecystitis and now with tube removed just prior to this hospitalization on 10/30/19. Pt presented on this hospitalization with SOB. Pt with acute respiratory failure, now requiring 2Lnc.   Clinical Impression   Pt was seen for OT evaluation this date. Prior to hospital admission, pt was reportedly Indep with toileting/grooming, required some assist at times for bathing/dressing, and alternated between SPC/RW for fxl mobility in community. Pt lives with her son and DIL in 2 story home, but pt is able to reside on main level. Currently pt demonstrates impairments as described below (See OT problem list) which functionally limit her ability to perform ADL/self-care tasks. Pt currently requires SBA/CGA for standing ADLs/ADL transfers with RW and SpO2 d/t decreased oxygenation (see ADL section re: O2 levels with activity). OT facilitates education with son to relay to pt re: PLB technique and fall prevention considerations for acute stay. Pt would benefit from skilled OT to address noted impairments and functional limitations (see below for any additional details) in order to maximize safety and independence while minimizing falls risk and caregiver burden. Upon hospital discharge, recommend Outpt OT to maximize pt safety and return to functional independence during meaningful occupations of daily life.     Follow Up Recommendations  Supervision - Intermittent;Outpatient OT    Equipment Recommendations  None recommended by OT(pt's son reports having all necessary equipment)    Recommendations for Other Services       Precautions / Restrictions Precautions Precautions:  Fall Restrictions Weight Bearing Restrictions: No      Mobility Bed Mobility Overal bed mobility: Modified Independent             General bed mobility comments: HOB elevated for sup to sit  Transfers Overall transfer level: Needs assistance Equipment used: Rolling walker (2 wheeled) Transfers: Sit to/from Stand Sit to Stand: Supervision;Min guard         General transfer comment: Pt requires some verbal cues for safety/awareness of oxygen tubing.    Balance Overall balance assessment: Mild deficits observed, not formally tested                                         ADL either performed or assessed with clinical judgement   ADL                                         General ADL Comments: Pt requires SBA with ADL transfers/standing grooming, some diffiuclty reaching feet in sitting, requires MIN/MOD A with LB dressing. RW and SpO2 for fxl mobility. Pt gradually de-sats in sitting when 2Lnc removed (3 mins to decrease from 96% to 89-90%). 2Lnc re-applied, de-sats to ~88-90% wiht fxl mobility. WOB/RR notably/visibly increases with fxl mobility to/from restroom/standing grooming sink-side to wash hands.     Vision Patient Visual Report: No change from baseline Additional Comments: hx macular degeneration, but appears to see well enough to negotiate pathways in room.     Perception     Praxis  Pertinent Vitals/Pain Pain Assessment: Faces Faces Pain Scale: Hurts a little bit Pain Location: abdomen near tube/gallbladder removal site Pain Descriptors / Indicators: Grimacing Pain Intervention(s): Monitored during session     Hand Dominance     Extremity/Trunk Assessment Upper Extremity Assessment Upper Extremity Assessment: Generalized weakness   Lower Extremity Assessment Lower Extremity Assessment: Defer to PT evaluation;Generalized weakness       Communication Communication Communication: Prefers language other  than [place];Other (comment);HOH(russian)   Cognition Arousal/Alertness: Awake/alert Behavior During Therapy: WFL for tasks assessed/performed Overall Cognitive Status: History of cognitive impairments - at baseline                                 General Comments: Son reports that she has some difficulty with remembering/accurately reporting medical history, so he helps with that. States she is generally oriented, aware she is in hospital.   General Comments       Exercises Other Exercises Other Exercises: OT attempts to facilitate education with pt re: role of OT. Pt's son somewhat filter's conversation and indicates that pt will not understand difference between PT/OT. OT then facilitates education with son about accurately representing role of the OT for scope of practice. Other Exercises: OT facilitates education re: safety/fall prevention. Pt with some episodes of impulsivity, primarily d/t needing to use restroom to have BM. OT encourages pt to be aware of oxygen tank/tube and use 2WW in rom versus furniture walking for fall prevention. In addition, OT encourages pt to request help before standing.   Shoulder Instructions      Home Living Family/patient expects to be discharged to:: Private residence Living Arrangements: Children(son and DIL) Available Help at Discharge: Family;Available PRN/intermittently;Personal care attendant(PCA in the mornings for ADL assistance.) Type of Home: House Home Access: Stairs to enter [employer] of Steps: 2, 2 inch steps   Home Layout: Two level;Able to live on main level with bedroom/bathroom     Bathroom Shower/Tub: Arts development officer Toilet: Handicapped height Bathroom Accessibility: No   Home Equipment: Environmental consultant - 2 wheels;Cane - single point;Grab bars - tub/shower;Grab bars - toilet;Bedside commode;Wheelchair - manual          Prior Functioning/Environment Level of Independence: Needs assistance   Gait / Transfers Assistance Needed: Alternates SPC/RW for community mobility, son describes "furniture walking" in the household ADL's / Homemaking Assistance Needed: family or aide assists with dressing/bathing ADLs. Son reports family can attend to her own toileting/grooming. Communication / Swallowing Assistance Needed: speaks russian, interpreter phone used Debby Bud with [employer], ID#: [phone]). Son, however, primarily translates throughout session as pt is not able to clearly hear on interpreter phone. Comments: hx macular degeneration        OT Problem List: Decreased strength;Decreased activity tolerance;Impaired balance (sitting and/or standing);Decreased safety awareness;Decreased knowledge of use of DME or AE;Impaired vision/perception;Cardiopulmonary status limiting activity      OT Treatment/Interventions: Self-care/ADL training;Therapeutic exercise;Energy conservation;DME and/or AE instruction;Therapeutic activities;Patient/family education;Balance training    OT Goals(Current goals can be found in the care plan section) Acute Rehab OT Goals Patient Stated Goal: to be able to leave hospital and resume outpt therapy OT Goal Formulation: With patient/family Time For Goal Achievement: 11/21/19 Potential to Achieve Goals: Good  OT Frequency: Min 2X/week   Barriers to D/C:            Co-evaluation              AM-PAC  OT "6 Clicks" Daily Activity     Outcome Measure Help from another person eating meals?: None Help from another person taking care of personal grooming?: A Little Help from another person toileting, which includes using toliet, bedpan, or urinal?: A Little Help from another person bathing (including washing, rinsing, drying)?: A Little Help from another person to put on and taking off regular upper body clothing?: None Help from another person to put on and taking off regular lower body clothing?: A Little 6 Click Score: 20   End of Session  Equipment Utilized During Treatment: Gait belt;Rolling walker Nurse Communication: Mobility status  Activity Tolerance: Patient tolerated treatment well Patient left: in bed;with call bell/phone within reach;with family/visitor present  OT Visit Diagnosis: Unsteadiness on feet (R26.81)                Time: 1093-2355 OT Time Calculation (min): 39 min Charges:  OT General Charges $OT Visit: 1 Visit OT Evaluation $OT Eval Moderate Complexity: 1 Mod OT Treatments $Self Care/Home Management : 8-22 mins $Therapeutic Activity: 8-22 mins  Gerrianne Scale, MS, OTR/L ascom [phone] 11/07/19, 12:12 PM

## 2019-11-07 NOTE — Plan of Care (Signed)
  Problem: Activity: Goal: Capacity to carry out activities will improve Outcome: Progressing   Problem: Cardiac: Goal: Ability to achieve and maintain adequate cardiopulmonary perfusion will improve Outcome: Progressing   Problem: Health Behavior/Discharge Planning: Goal: Ability to manage health-related needs will improve Outcome: Progressing   Problem: Clinical Measurements: Goal: Ability to maintain clinical measurements within normal limits will improve Outcome: Progressing Goal: Will remain free from infection Outcome: Progressing Goal: Diagnostic test results will improve Outcome: Progressing Goal: Respiratory complications will improve Outcome: Progressing Goal: Cardiovascular complication will be avoided Outcome: Progressing

## 2019-11-07 NOTE — Progress Notes (Signed)
Initial Nutrition Assessment  DOCUMENTATION CODES:   Non-severe (moderate) malnutrition in context of chronic illness  INTERVENTION:   Magic cup TID with meals, each supplement provides 290 kcal and 9 grams of protein  Vital Cuisine BID, each supplement provides 520kcal and 22g of protein.   MVI daily   Liberalize diet   NUTRITION DIAGNOSIS:   Moderate Malnutrition related to chronic illness(COPD, CHF) as evidenced by moderate fat depletion, moderate muscle depletion.  GOAL:   Patient will meet greater than or equal to 90% of their needs  MONITOR:   PO intake, Supplement acceptance, Labs, Weight trends, Skin, I & O's  REASON FOR ASSESSMENT:   Consult Assessment of nutrition requirement/status  ASSESSMENT:   86 y.o. Russian-speaking female with medical history significant for A. fib on Eliquis, systolic heart failure, EF 45 to 50% February 2021, advanced COPD, CKD 3, status post recent laparoscopic cholecystectomy on 10/30/2019, who presents to the emergency room with a 3-day history of shortness of breath not responding to home bronchodilator therapy.   Met with pt and pt's son in room today. Pt does not speak any English so son, who speaks English interpreted. Son reports pt with fair appetite and oral intake at baseline. A usual day for patient would include eggs, toast and barley or oats for breakfast, some toast and tea for lunch and a salad with some sort of meat for dinner. Pt is a very picky eater at baseline (does not like yogurt, ice cream, cottage cheese, crackers or peanut butter). Pt does not like any kind of nutritional supplements as son reports they have tried milky and juice like supplements before. Pt does not really like the hospital food; she reports that its just not the food she is used to eating. Pt ate two bites off of her lunch tray today. RD discussed with pt the importance of adequate nutrition needed to preserve lean muscle. Pt reports that she is  willing to try ice cream with her meals. RD will add Magic Cups and Vital Cuisine to meal trays. RD will liberalize pt's diet as pt is not eating enough to exceed nutrient limits and this may encourage her to eat more food.  Per chart, pt down 5lbs(4%) over the past 2 months; this is not significant.    Medications reviewed and include: lasix, MVI, prednisone  Labs reviewed: K 3.4(L) Hgb 8.9(L), Hct 29.4(L)  NUTRITION - FOCUSED PHYSICAL EXAM:    Most Recent Value  Orbital Region  Mild depletion  Upper Arm Region  Moderate depletion  Thoracic and Lumbar Region  Moderate depletion  Buccal Region  Mild depletion  Temple Region  Moderate depletion  Clavicle Bone Region  Moderate depletion  Clavicle and Acromion Bone Region  Moderate depletion  Scapular Bone Region  Moderate depletion  Dorsal Hand  Moderate depletion  Patellar Region  Moderate depletion  Anterior Thigh Region  Mild depletion  Posterior Calf Region  Moderate depletion  Edema (RD Assessment)  None  Hair  Reviewed  Eyes  Reviewed  Mouth  Reviewed  Skin  Reviewed  Nails  Reviewed     Diet Order:   Diet Order            Diet regular Room service appropriate? Yes; Fluid consistency: Thin  Diet effective now             EDUCATION NEEDS:   Education needs have been addressed  Skin:  Skin Assessment: Reviewed RN Assessment(ecchymosis, closed incision abdomen s/p lap chole 4/13)  Last BM:  4/21- type 4  Height:   Ht Readings from Last 1 Encounters:  11/06/19 5' (1.524 m)    Weight:   Wt Readings from Last 1 Encounters:  11/07/19 55.8 kg    Ideal Body Weight:  45.45 kg  BMI:  Body mass index is 24.03 kg/m.  Estimated Nutritional Needs:   Kcal:  1300-1500kcal/day  Protein:  70-80g/day  Fluid:  1.4L/day  Koleen Distance MS, RD, LDN Please refer to [facility] for RD and/or RD on-call/weekend/after hours pager

## 2019-11-07 NOTE — Consult Note (Signed)
[facility] Consultation Note  Patient ID: Diana Powell, MRN: [id number], DOB/AGE: April 23, 1927 87 y.o. Admit date: 11/05/2019   Date of Consult: 11/07/2019 Primary Physician: Layne Benton, MD Primary Cardiologist: [facility]  Chief Complaint:  Chief Complaint  Patient presents with  . Shortness of Breath   Reason for Consult: Shortness of breath  HPI: 86 y.o. female with chronic nonvalvular atrial fibrillation essential hypertension and mild aortic valve stenosis who has recently had a cholecystectomy for which she tolerated fairly well.  She was ambulating well and at home when she had progression of shortness of breath multifactorial in nature including lung hypoxia as well as acute combined systolic just diastolic dysfunction heart failure.  Chest x-ray did not show any significant pulmonary edema BNP was 291 troponin is 21 and the patient has had no evidence of significant chronic kidney disease.  She was anemic which may be contributing as well to a hemoglobin 8.9.  The patient has responded to intravenous diuresis with furosemide and has had some improvements.  She has been on anticoagulation for further risk reduction in stroke with atrial fibrillation without any concerns although there his anemia which may need further address.  Currently she is not had any significant symptoms of chest discomfort and is responding to above medication management  Past Medical History:  Diagnosis Date  . Atrial fibrillation ([place])   . CHF (congestive heart failure) ([place])   . Chronic systolic heart failure ([place])   . Complication of anesthesia    CONFUSION   . COPD (chronic obstructive pulmonary disease) ([place])   . Osteoarthritis, multiple sites   . Osteoporosis, post-menopausal   . Presence of permanent cardiac pacemaker       Surgical History:  Past Surgical History:  Procedure Laterality Date  . CARDIOVERSION  ~2009  . HIP FRACTURE SURGERY Bilateral [phone]   each hip fractured at  separate times  . INSERT / REPLACE / REMOVE PACEMAKER    . IR EXCHANGE BILIARY DRAIN  10/05/2019  . TOTAL HIP ARTHROPLASTY Right 04/20/2018   Procedure: TOTAL HIP CONVERSION;  Surgeon: Hessie Knows, MD;  Location: [facility] ORS;  Service: Orthopedics;  Laterality: Right;     Home Meds: Prior to Admission medications   Medication Sig Start Date End Date Taking? Authorizing Provider  acetaminophen (TYLENOL) 500 MG tablet Take 2 tablets (1,000 mg total) by mouth every 6 (six) hours as needed for mild pain or moderate pain. Additional  500 mg if needed 10/30/19  Yes Piscoya, Jose, MD  apixaban (ELIQUIS) 2.5 MG TABS tablet Take 2.5 mg by mouth in the morning and at bedtime. 10/26/19 10/25/20 Yes [provider]  budesonide (PULMICORT) 0.5 MG/2ML nebulizer solution Take 0.5 mg by nebulization in the morning and at bedtime.  01/23/18  Yes [provider]  Calcium Carbonate-Vitamin D (CALCIUM-VITAMIN D) 500-200 MG-UNIT per tablet Take 1 tablet by mouth daily.   Yes [provider]  ferrous sulfate 325 (65 FE) MG tablet Take 325 mg by mouth at bedtime.    Yes [provider]  formoterol (PERFOROMIST) 20 MCG/2ML nebulizer solution Inhale 20 mcg into the lungs every 12 (twelve) hours. 11/30/17  Yes [provider]  furosemide (LASIX) 40 MG tablet Take 40 mg by mouth 2 (two) times daily. Take additional 40 mg in morning if needed   Yes [provider]  ibuprofen (ADVIL) 400 MG tablet Take 1 tablet (400 mg total) by mouth every 6 (six) hours as needed for moderate pain. 10/30/19  Yes Piscoya, Jose, MD  ipratropium-albuterol (DUONEB) 0.5-2.5 (3) MG/3ML SOLN Inhale 3 mLs into the lungs every 8 (eight) hours as needed. 08/24/19  Yes [provider]  magnesium oxide (MAG-OX) 400 MG tablet Take 400 mg by mouth daily.   Yes [provider]  oxyCODONE (OXY IR/ROXICODONE) 5 MG immediate release tablet Take 1 tablet (5 mg total) by mouth every 6 (six) hours as  needed for severe pain. 10/30/19  Yes Piscoya, Elita Quick, MD  potassium chloride (K-DUR) 10 MEQ tablet Take 10 mEq by mouth daily. 02/18/18  Yes [provider]  vitamin B-12 (CYANOCOBALAMIN) 1000 MCG tablet Take 1,000 mcg by mouth at bedtime.    Yes [provider]  Disposable Gloves (RA EXTENDED CUFF NITRILE GLOVE) MISC Gloves as indicated for incontinence care 09/12/19   [provider]  Incontinence Supply Disposable (DEPEND EASY FIT UNDERGARMENTS) MISC Please provide pull-ups, gloves, and liners. Use as needed 09/12/19   [provider]  Incontinence Supply Disposable (HARMONIE UNDERPAD) MISC Use as needed for incontinence. 09/12/19   [provider]  tetrahydrozoline (EYE DROPS) 0.05 % ophthalmic solution Place 1 drop into both eyes daily as needed (dry eye).    [provider]    Inpatient Medications:  . apixaban  2.5 mg Oral BID  . arformoterol  15 mcg Nebulization Q12H  . budesonide  0.5 mg Nebulization BID  . furosemide  60 mg Intravenous BID  . ipratropium-albuterol  3 mL Nebulization Q6H  . [START ON 11/08/2019] multivitamin with minerals  1 tablet Oral Daily  . predniSONE  40 mg Oral Q breakfast  . sodium chloride flush  3 mL Intravenous Q12H   . sodium chloride      Allergies: No Known Allergies  Social History   Socioeconomic History  . Marital status: Widowed    Spouse name: Not on file  . Number of children: 3  . Years of education: Not on file  . Highest education level: Not on file  Occupational History  . Occupation: Engineer--Acoustic    Comment: Retired  Tobacco Use  . Smoking status: Never Smoker  . Smokeless tobacco: Never Used  Substance and Sexual Activity  . Alcohol use: Not Currently  . Drug use: No  . Sexual activity: Not on file  Other Topics Concern  . Not on file  Social History Narrative   Widowed twice   Son and 2 daughters   Social Determinants of Health   Financial Resource Strain:   .  Difficulty of Paying Living Expenses:   Food Insecurity:   . Worried About Programme researcher, broadcasting/film/video in the Last Year:   . Barista in the Last Year:   Transportation Needs:   . Freight forwarder (Medical):   Marland Kitchen Lack of Transportation (Non-Medical):   Physical Activity:   . Days of Exercise per Week:   . Minutes of Exercise per Session:   Stress:   . Feeling of Stress :   Social Connections:   . Frequency of Communication with Friends and Family:   . Frequency of Social Gatherings with Friends and Family:   . Attends Religious Services:   . Active Member of Clubs or Organizations:   . Attends Banker Meetings:   Marland Kitchen Marital Status:   Intimate Partner Violence:   . Fear of Current or Ex-Partner:   . Emotionally Abused:   Marland Kitchen Physically Abused:   . Sexually Abused:      Family History  Problem Relation Age of Onset  . CAD Mother   . CVA Mother      Review of Systems Positive for shortness of breath Negative for: General:  chills, fever, night sweats or weight changes.  Cardiovascular: PND orthopnea syncope dizziness  Dermatological skin lesions rashes Respiratory: Cough congestion Urologic: Frequent urination urination at night and hematuria Abdominal: negative for nausea, vomiting, diarrhea, bright red blood per rectum, melena, or hematemesis Neurologic: negative for visual changes, and/or hearing changes  All other systems reviewed and are otherwise negative except as noted above.  Labs: No results for input(s): CKTOTAL, CKMB, TROPONINI in the last 72 hours. Lab Results  Component Value Date   WBC 5.0 11/07/2019   HGB 8.9 (L) 11/07/2019   HCT 29.4 (L) 11/07/2019   MCV 92.7 11/07/2019   PLT 245 11/07/2019    Recent Labs  Lab 11/05/19 2349 11/05/19 2349 11/07/19 0412  NA 142   < > 142  K 3.9   < > 3.4*  CL 98   < > 99  CO2 31   < > 33*  BUN 17   < > 22  CREATININE 1.23*   < > 0.91  CALCIUM 8.8*   < > 8.1*  PROT 7.1  --   --   BILITOT 0.6   --   --   ALKPHOS 68  --   --   ALT 15  --   --   AST 28  --   --   GLUCOSE 132*   < > 188*   < > = values in this interval not displayed.   No results found for: CHOL, HDL, LDLCALC, TRIG No results found for: DDIMER  Radiology/Studies:  CT Angio Chest PE W and/or Wo Contrast  Result Date: 11/06/2019 CLINICAL DATA:  Shortness of breath EXAM: CT ANGIOGRAPHY CHEST WITH CONTRAST TECHNIQUE: Multidetector CT imaging of the chest was performed using the standard protocol during bolus administration of intravenous contrast. Multiplanar CT image reconstructions and MIPs were obtained to evaluate the vascular anatomy. CONTRAST:  93mL OMNIPAQUE IOHEXOL 350 MG/ML SOLN COMPARISON:  10/09/2007 FINDINGS: Cardiovascular: No filling defects in the pulmonary arteries to suggest pulmonary emboli. Cardiomegaly. Diffuse aortic calcifications. No evidence of aortic aneurysm. Tortuosity of the thoracic aorta. Mediastinum/Nodes: No mediastinal, hilar, or axillary adenopathy. Trachea and esophagus are unremarkable. Lungs/Pleura: Scattered ground-glass opacities in the lungs may reflect early edema. No confluent opacities. No effusions. Upper Abdomen: Imaging into the upper abdomen shows no acute findings. Musculoskeletal: Chest wall soft tissues are unremarkable. Scoliosis and degenerative changes in the spine. No acute bony abnormality. Review of the MIP images confirms the above findings. IMPRESSION: No evidence of pulmonary embolus. Cardiomegaly. Scattered ground-glass opacities within the lungs could reflect early edema. Aortic Atherosclerosis (ICD10-I70.0). Electronically Signed   By: Charlett Nose M.D.   On: 11/06/2019 02:46   DG Chest Portable 1 View  Result Date: 11/06/2019 CLINICAL DATA:  Shortness of breath EXAM: PORTABLE CHEST 1 VIEW COMPARISON:  09/04/2019 FINDINGS: Left pacer remains in place, unchanged. Cardiomegaly. No overt edema. No confluent opacities or effusions. Severe thoracolumbar scoliosis.  IMPRESSION: Cardiomegaly.  No active disease. Electronically Signed   By: Charlett Nose M.D.   On: 11/06/2019 00:10    EKG: Atrial fibrillation with controlled ventricular rate  Weights: Filed Weights   11/06/19 0019 11/06/19 2101 11/07/19 0451  Weight: 54.4 kg 55.8 kg 55.8 kg     Physical Exam: Blood pressure (!) 108/47, pulse 70, temperature 98.4 F (36.9 C),  temperature source Oral, resp. rate 18, height 5' (1.524 m), weight 55.8 kg, SpO2 98 %. Body mass index is 24.03 kg/m. General: Well developed, well nourished, in no acute distress. Head eyes ears nose throat: Normocephalic, atraumatic, sclera non-icteric, no xanthomas, nares are without discharge. No apparent thyromegaly and/or mass  Lungs: Normal respiratory effort.  Some wheezes, few rales, no rhonchi.  Heart: RRR with normal S1 S2.  2-3+ aortic murmur gallop, no rub, PMI is normal size and placement, carotid upstroke normal without bruit, jugular venous pressure is normal Abdomen: Soft, non-tender, non-distended with normoactive bowel sounds. No hepatomegaly. No rebound/guarding. No obvious abdominal masses. Abdominal aorta is normal size without bruit Extremities: Trace edema. no cyanosis, no clubbing, no ulcers  Peripheral : 2+ bilateral upper extremity pulses, 2+ bilateral femoral pulses, 2+ bilateral dorsal pedal pulse Neuro: Alert and oriented. No facial asymmetry. No focal deficit. Moves all extremities spontaneously. Musculoskeletal: Normal muscle tone without kyphosis Psych:  Responds to questions appropriately with a normal affect.    Assessment: 86 year old female with chronic nonvalvular atrial fibrillation mild aortic valve stenosis with acute combined systolic diastolic dysfunction congestive heart failure anemia without evidence of myocardial infarction  Plan: 1.  No additional medication management at this time needed for blood pressure heart rate control which appears to be relatively stable 2.  Continue  intravenous diuresis with Lasix until patient significantly improved with less hypoxia and on room air.  At that time would continue Lasix and change to 40 mg each day orally 3.  No further cardiac diagnostics necessary at this time 4.  Begin ambulation and follow-up for improvements as per above 5.  Continue anticoagulation for further risk reduction stroke with atrial fibrillation without change   Signed, Lamar Blinks M.D. [facility] 11/07/2019, 5:11 PM

## 2019-11-07 NOTE — TOC Progression Note (Signed)
Transition of Care [facility]) - Progression Note    Patient Details  Name: Diana Powell MRN: [id number] Date of Birth: [birth year]  Transition [facility] CM/SW Contact  Reeshemah Nazaryan, Gardiner Rhyme, LCSW Phone Number: 11/07/2019, 10:12 AM  Clinical Narrative:   Met with son and pt to introduce myself and let them know I will be following to assist with DC needs. They have a rw and bsc and only need may possibly be home O2, hope by getting fluid off she will not need it. Between he and daughter in-law they almost provide 24 hr care. She was going to OP and would like to resume at DC. Son and daughter in-law do all of the home management. Pt can take care of her personal needs and is mobile with her cane or rw. Will work on discharge needs and await if will need home O2-OT reports she desats to 88-89% right on the borderline of insurance requirement.    Expected Discharge Plan: Home/Self Care    Expected Discharge Plan and Services Expected Discharge Plan: Home/Self Care In-house Referral: Clinical Social Work     Living arrangements for the past 2 months: Single Family Home                                       Social Determinants of Health (SDOH) Interventions    Readmission Risk Interventions No flowsheet data found.

## 2019-11-07 NOTE — Progress Notes (Signed)
PT Cancellation Note  Patient Details Name: Diana Powell MRN: [id number] DOB: September 12, 1926   Cancelled Treatment:    Reason Eval/Treat Not Completed: Fatigue/lethargy limiting ability to participate(Consult received and chart reviewed.  Per son/patient, patient just returned to bed approx 20 min prior and wishes to rest a little longer before additional activity.  Will re-attempt at later time/date as medically appropriate and available.)  [place] interpreter, Skeet Simmer [phone] used for interpretation services throughout session attempt. Of note, patient's son prefers to do translating during session.   Arabella Revelle H. Manson Passey, PT, DPT, NCS 11/07/19, 2:39 PM [phone]

## 2019-11-07 NOTE — Plan of Care (Signed)
  Problem: Activity: Goal: Capacity to carry out activities will improve Outcome: Progressing   Problem: Cardiac: Goal: Ability to achieve and maintain adequate cardiopulmonary perfusion will improve Outcome: Progressing   Problem: Health Behavior/Discharge Planning: Goal: Ability to manage health-related needs will improve Outcome: Progressing   Problem: Clinical Measurements: Goal: Ability to maintain clinical measurements within normal limits will improve Outcome: Progressing Goal: Will remain free from infection Outcome: Progressing Goal: Diagnostic test results will improve Outcome: Progressing Goal: Respiratory complications will improve Outcome: Progressing Goal: Cardiovascular complication will be avoided Outcome: Progressing   Problem: Activity: Goal: Risk for activity intolerance will decrease Outcome: Progressing   Problem: Nutrition: Goal: Adequate nutrition will be maintained Outcome: Progressing   Problem: Coping: Goal: Level of anxiety will decrease Outcome: Progressing   Problem: Elimination: Goal: Will not experience complications related to bowel motility Outcome: Progressing Goal: Will not experience complications related to urinary retention Outcome: Progressing   Problem: Pain Managment: Goal: General experience of comfort will improve Outcome: Progressing   Problem: Safety: Goal: Ability to remain free from injury will improve Outcome: Progressing   Problem: Skin Integrity: Goal: Risk for impaired skin integrity will decrease Outcome: Progressing   

## 2019-11-07 NOTE — Progress Notes (Signed)
PROGRESS NOTE    Diana Powell  TIR:[id number] DOB: 1927/02/04 DOA: 11/05/2019 PCP: Layne Benton, MD   Brief Narrative:   Diana Powell is a 86 y.o. Russian-speaking female with medical history significant for A. fib on Eliquis, systolic heart failure, EF 45 to 50% February 2021, advanced COPD, CKD 3, status post recent laparoscopic cholecystectomy on 10/30/2019, who presents to the emergency room with a 3-day history of shortness of breath not responding to home bronchodilator therapy.  Has no chest pain cough fever or chills.  No abdominal pain vomiting or diarrhea and received the Covid vaccine.  She was hypoxic at 88% with EMS improving to the mid 90s on 3 L.  She received DuoNeb in route. CTA negative for PE but did show some pulmonary edema.  Admitted for hypoxic respiratory failure most likely secondary to CHF exacerbation.  Subjective: Patient continued to feel short of breath.  She was accompanied by her son in the room who also helped as interpreter.  She is a 86 elderly lady.  Denies any chest pain. According to son she is having worsening shortness of breath and declining health over the past year.  Shortness of breath really get worse over the past few days.  Patient sleeps with 2-3 pillows recently stating that helps with her shortness of breath.  Assessment & Plan:   Principal Problem:   Acute respiratory failure with hypoxia (HCC) Active Problems:   AF (paroxysmal atrial fibrillation) (HCC)   COPD with acute exacerbation (HCC)   S/P laparoscopic cholecystectomy 10/30/19   CHF (congestive heart failure) (HCC)   CKD (chronic kidney disease) stage 3, GFR 30-59 ml/min   Acute on chronic systolic CHF (congestive heart failure) (HCC)   Diastolic CHF (HCC)  Acute respiratory failure with hypoxia (HCC) Acute on chronic systolic CHF (congestive heart failure) ([place]) COPD with acute exacerbation ([place]). Patient was saturating well on 2 L, not on home oxygen.  Continue  to experience dyspnea.  BNP mildly elevated at 291 with positive JVD and bilateral basal crackles.  There was some concern of COPD exacerbation by increase in her cough, no wheezing today. Weight is stable at 123 pound.  According to her cardiologist note in March 2021 her dry weight is 120 pound.  Recent decreased in her EF to 45 to 50% on echo done in February 2021 with some regional wall motion abnormalities, prior echo with normal EF and grade 2 diastolic dysfunction. -Cardiology consult-we will appreciate their recommendations for maximization of medical management, as patient is not on any beta-blocker or ACE inhibitor at home.  Patient has a pacemaker in place since 2016 after getting AV node ablation due to A. Fib. -Increase Lasix to 60 mg twice daily. -Continue monitoring strict intake and output and daily weight. -Daily BMP. -Continue scheduled and as needed bronchodilator treatment. -Continue prednisone 40 mg daily for another 3 days.  AF (paroxysmal atrial fibrillation) ([place]) patient has AV ablation done in 2016 and a pacemaker placed.  Appears to be rate controlled and regular. -Continue Eliquis 2.5 mg twice daily.  S/P laparoscopic cholecystectomy 10/30/19 -Patient underwent uneventful laparoscopic cholecystectomy on 10/30/2019. -No acute concerns.  CKD 3 -At baseline renal function.  Objective: Vitals:   11/07/19 0437 11/07/19 0451 11/07/19 0756 11/07/19 0809  BP: (!) 127/57   (!) 130/57  Pulse: 69   70  Resp: 16   18  Temp: 98.2 F (36.8 C)   98.3 F (36.8 C)  TempSrc: Oral   Oral  SpO2: 98%  97% 98%  Weight:  55.8 kg    Height:        Intake/Output Summary (Last 24 hours) at 11/07/2019 1258 Last data filed at 11/07/2019 0800 Gross per 24 hour  Intake --  Output 225 ml  Net -225 ml   Filed Weights   11/06/19 0019 11/06/19 2101 11/07/19 0451  Weight: 54.4 kg 55.8 kg 55.8 kg    Examination:  General exam: Appears calm and comfortable  Respiratory system:  Bilateral basal crackles. Respiratory effort normal. Cardiovascular system: S1 & S2 heard, RRR. JVD positive. Gastrointestinal system: Soft, nontender, nondistended, bowel sounds positive. Well-healing surgical scars from laparoscopic cholecystectomy with no sign of infection. Central nervous system: Alert and oriented. No focal neurological deficits.Symmetric 5 x 5 power. Extremities: No edema, no cyanosis, pulses intact and symmetrical. Skin: No rashes, lesions or ulcers Psychiatry: Judgement and insight appear normal.    DVT prophylaxis: Eliquis Code Status: Full  Family Communication: Son was updated at bedside. Disposition Plan:  Status is: Inpatient  Remains inpatient appropriate because:IV treatments appropriate due to intensity of illness or inability to take PO and Inpatient level of care appropriate due to severity of illness   Dispo: The patient is from: Home              Anticipated d/c is to: Home              Anticipated d/c date is: 2 days              Patient currently is not medically stable to d/c.   Consultants:   Cardiology  Procedures:  Antimicrobials:   Data Reviewed: I have personally reviewed following labs and imaging studies  CBC: Recent Labs  Lab 11/05/19 2349 11/07/19 0412  WBC 6.2 5.0  NEUTROABS 3.5  --   HGB 10.5* 8.9*  HCT 34.8* 29.4*  MCV 93.3 92.7  PLT 282 245   Basic Metabolic Panel: Recent Labs  Lab 11/05/19 2349 11/07/19 0412  NA 142 142  K 3.9 3.4*  CL 98 99  CO2 31 33*  GLUCOSE 132* 188*  BUN 17 22  CREATININE 1.23* 0.91  CALCIUM 8.8* 8.1*   GFR: Estimated Creatinine Clearance: 32.2 mL/min (by C-G formula based on SCr of 0.91 mg/dL). Liver Function Tests: Recent Labs  Lab 11/05/19 2349  AST 28  ALT 15  ALKPHOS 68  BILITOT 0.6  PROT 7.1  ALBUMIN 3.6   No results for input(s): LIPASE, AMYLASE in the last 168 hours. No results for input(s): AMMONIA in the last 168 hours. Coagulation Profile: No results for  input(s): INR, PROTIME in the last 168 hours. Cardiac Enzymes: No results for input(s): CKTOTAL, CKMB, CKMBINDEX, TROPONINI in the last 168 hours. BNP (last 3 results) No results for input(s): PROBNP in the last 8760 hours. HbA1C: No results for input(s): HGBA1C in the last 72 hours. CBG: No results for input(s): GLUCAP in the last 168 hours. Lipid Profile: No results for input(s): CHOL, HDL, LDLCALC, TRIG, CHOLHDL, LDLDIRECT in the last 72 hours. Thyroid Function Tests: No results for input(s): TSH, T4TOTAL, FREET4, T3FREE, THYROIDAB in the last 72 hours. Anemia Panel: No results for input(s): VITAMINB12, FOLATE, FERRITIN, TIBC, IRON, RETICCTPCT in the last 72 hours. Sepsis Labs: Recent Labs  Lab 11/06/19 0008  LATICACIDVEN 1.9    Recent Results (from the past 240 hour(s))  Blood culture (routine x 2)     Status: None (Preliminary result)   Collection Time: 11/06/19 12:04 AM   Specimen:  BLOOD  Result Value Ref Range Status   Specimen Description BLOOD LFOA  Final   Special Requests BOTTLES DRAWN AEROBIC AND ANAEROBIC BCLV  Final   Culture   Final    NO GROWTH 1 DAY Performed at [facility], [address] [place], [place] [address]    Report Status PENDING  Incomplete  Blood culture (routine x 2)     Status: None (Preliminary result)   Collection Time: 11/06/19 12:04 AM   Specimen: BLOOD  Result Value Ref Range Status   Specimen Description BLOOD RAC  Final   Special Requests BOTTLES DRAWN AEROBIC AND ANAEROBIC BCAV  Final   Culture   Final    NO GROWTH 1 DAY Performed at [facility], [address], [place], [place] [address]    Report Status PENDING  Incomplete  Respiratory Panel by RT PCR (Flu A&B, Covid) - Nasopharyngeal Swab     Status: None   Collection Time: 11/06/19 12:58 AM   Specimen: Nasopharyngeal Swab  Result Value Ref Range Status   SARS Coronavirus 2 by RT PCR NEGATIVE NEGATIVE Final    Comment: (NOTE) SARS-CoV-2 target nucleic  acids are NOT DETECTED. The SARS-CoV-2 RNA is generally detectable in upper respiratoy specimens during the acute phase of infection. The lowest concentration of SARS-CoV-2 viral copies this assay can detect is 131 copies/mL. A negative result does not preclude SARS-Cov-2 infection and should not be used as the sole basis for treatment or other patient management decisions. A negative result may occur with  improper specimen collection/handling, submission of specimen other than nasopharyngeal swab, presence of viral mutation(s) within the areas targeted by this assay, and inadequate number of viral copies (<131 copies/mL). A negative result must be combined with clinical observations, patient history, and epidemiological information. The expected result is Negative. Fact Sheet for Patients:  https://www.moore.com/ Fact Sheet for Healthcare Providers:  https://www.young.biz/ This test is not yet ap proved or cleared by the [place] FDA and  has been authorized for detection and/or diagnosis of SARS-CoV-2 by FDA under an Emergency Use Authorization (EUA). This EUA will remain  in effect (meaning this test can be used) for the duration of the COVID-19 declaration under Section 564(b)(1) of the Act, 21 U.S.C. section 360bbb-3(b)(1), unless the authorization is terminated or revoked sooner.    Influenza A by PCR NEGATIVE NEGATIVE Final   Influenza B by PCR NEGATIVE NEGATIVE Final    Comment: (NOTE) The Xpert Xpress SARS-CoV-2/FLU/RSV assay is intended as an aid in  the diagnosis of influenza from Nasopharyngeal swab specimens and  should not be used as a sole basis for treatment. Nasal washings and  aspirates are unacceptable for Xpert Xpress SARS-CoV-2/FLU/RSV  testing. Fact Sheet for Patients: https://www.moore.com/ Fact Sheet for Healthcare Providers: https://www.young.biz/ This test is not yet  approved or cleared by the [place] FDA and  has been authorized for detection and/or diagnosis of SARS-CoV-2 by  FDA under an Emergency Use Authorization (EUA). This EUA will remain  in effect (meaning this test can be used) for the duration of the  Covid-19 declaration under Section 564(b)(1) of the Act, 21  U.S.C. section 360bbb-3(b)(1), unless the authorization is  terminated or revoked. Performed at [facility], [address] [place], [place] [address]      Radiology Studies: CT Angio Chest PE W and/or Wo Contrast  Result Date: 11/06/2019 CLINICAL DATA:  Shortness of breath EXAM: CT ANGIOGRAPHY CHEST WITH CONTRAST TECHNIQUE: Multidetector CT imaging of the  chest was performed using the standard protocol during bolus administration of intravenous contrast. Multiplanar CT image reconstructions and MIPs were obtained to evaluate the vascular anatomy. CONTRAST:  90mL OMNIPAQUE IOHEXOL 350 MG/ML SOLN COMPARISON:  10/09/2007 FINDINGS: Cardiovascular: No filling defects in the pulmonary arteries to suggest pulmonary emboli. Cardiomegaly. Diffuse aortic calcifications. No evidence of aortic aneurysm. Tortuosity of the thoracic aorta. Mediastinum/Nodes: No mediastinal, hilar, or axillary adenopathy. Trachea and esophagus are unremarkable. Lungs/Pleura: Scattered ground-glass opacities in the lungs may reflect early edema. No confluent opacities. No effusions. Upper Abdomen: Imaging into the upper abdomen shows no acute findings. Musculoskeletal: Chest wall soft tissues are unremarkable. Scoliosis and degenerative changes in the spine. No acute bony abnormality. Review of the MIP images confirms the above findings. IMPRESSION: No evidence of pulmonary embolus. Cardiomegaly. Scattered ground-glass opacities within the lungs could reflect early edema. Aortic Atherosclerosis (ICD10-I70.0). Electronically Signed   By: Charlett Nose M.D.   On: 11/06/2019 02:46   DG Chest Portable 1  View  Result Date: 11/06/2019 CLINICAL DATA:  Shortness of breath EXAM: PORTABLE CHEST 1 VIEW COMPARISON:  09/04/2019 FINDINGS: Left pacer remains in place, unchanged. Cardiomegaly. No overt edema. No confluent opacities or effusions. Severe thoracolumbar scoliosis. IMPRESSION: Cardiomegaly.  No active disease. Electronically Signed   By: Charlett Nose M.D.   On: 11/06/2019 00:10    Scheduled Meds: . apixaban  2.5 mg Oral BID  . arformoterol  15 mcg Nebulization Q12H  . budesonide  0.5 mg Nebulization BID  . furosemide  40 mg Intravenous BID  . ipratropium-albuterol  3 mL Nebulization Q6H  . [START ON 11/08/2019] multivitamin with minerals  1 tablet Oral Daily  . predniSONE  40 mg Oral Q breakfast  . sodium chloride flush  3 mL Intravenous Q12H   Continuous Infusions: . sodium chloride       LOS: 1 day   Time spent: 50 minutes.  I personally reviewed her chart.  Arnetha Courser, MD [facility]  If 7PM-7AM, please contact night-coverage [email or web address]  11/07/2019, 12:58 PM   This record has been created using Conservation officer, historic buildings. Errors have been sought and corrected,but may not always be located. Such creation errors do not reflect on the standard of care.

## 2019-11-08 DIAGNOSIS — E440 Moderate protein-calorie malnutrition: Secondary | ICD-10-CM | POA: Insufficient documentation

## 2019-11-08 DIAGNOSIS — I509 Heart failure, unspecified: Secondary | ICD-10-CM | POA: Diagnosis not present

## 2019-11-08 DIAGNOSIS — I130 Hypertensive heart and chronic kidney disease with heart failure and stage 1 through stage 4 chronic kidney disease, or unspecified chronic kidney disease: Secondary | ICD-10-CM | POA: Diagnosis not present

## 2019-11-08 DIAGNOSIS — J9601 Acute respiratory failure with hypoxia: Secondary | ICD-10-CM | POA: Diagnosis not present

## 2019-11-08 LAB — IRON AND TIBC
Iron: 24 ug/dL — ABNORMAL LOW (ref 28–170)
Saturation Ratios: 10 % — ABNORMAL LOW (ref 10.4–31.8)
TIBC: 235 ug/dL — ABNORMAL LOW (ref 250–450)
UIBC: 211 ug/dL

## 2019-11-08 LAB — CBC
HCT: 29 % — ABNORMAL LOW (ref 36.0–46.0)
Hemoglobin: 8.8 g/dL — ABNORMAL LOW (ref 12.0–15.0)
MCH: 27.8 pg (ref 26.0–34.0)
MCHC: 30.3 g/dL (ref 30.0–36.0)
MCV: 91.8 fL (ref 80.0–100.0)
Platelets: 266 10*3/uL (ref 150–400)
RBC: 3.16 MIL/uL — ABNORMAL LOW (ref 3.87–5.11)
RDW: 13.8 % (ref 11.5–15.5)
WBC: 7.8 10*3/uL (ref 4.0–10.5)
nRBC: 0 % (ref 0.0–0.2)

## 2019-11-08 LAB — BASIC METABOLIC PANEL
Anion gap: 9 (ref 5–15)
BUN: 33 mg/dL — ABNORMAL HIGH (ref 8–23)
CO2: 34 mmol/L — ABNORMAL HIGH (ref 22–32)
Calcium: 8.5 mg/dL — ABNORMAL LOW (ref 8.9–10.3)
Chloride: 100 mmol/L (ref 98–111)
Creatinine, Ser: 1.15 mg/dL — ABNORMAL HIGH (ref 0.44–1.00)
GFR calc Af Amer: 49 mL/min — ABNORMAL LOW (ref >60)
GFR calc non Af Amer: 42 mL/min — ABNORMAL LOW (ref >60)
Glucose, Bld: 126 mg/dL — ABNORMAL HIGH (ref 70–99)
Potassium: 3.8 mmol/L (ref 3.5–5.1)
Sodium: 143 mmol/L (ref 135–145)

## 2019-11-08 LAB — VITAMIN B12: Vitamin B-12: 1077 pg/mL — ABNORMAL HIGH (ref 180–914)

## 2019-11-08 LAB — FOLATE: Folate: 8.2 ng/mL (ref >5.9)

## 2019-11-08 LAB — FERRITIN: Ferritin: 36 ng/mL (ref 11–307)

## 2019-11-08 LAB — RETICULOCYTES
Immature Retic Fract: 15.6 % (ref 2.3–15.9)
RBC.: 3.08 MIL/uL — ABNORMAL LOW (ref 3.87–5.11)
Retic Count, Absolute: 54.5 10*3/uL (ref 19.0–186.0)
Retic Ct Pct: 1.8 % (ref 0.4–3.1)

## 2019-11-08 MED ORDER — FUROSEMIDE 40 MG PO TABS
40.0000 mg | ORAL_TABLET | Freq: Every day | ORAL | Status: DC
  Administered 2019-11-08: 40 mg via ORAL
  Filled 2019-11-08: qty 1

## 2019-11-08 MED ORDER — POLYETHYLENE GLYCOL 3350 17 G PO PACK: 17.0000 g | PACK | Freq: Every day | ORAL | Status: DC

## 2019-11-08 MED ORDER — POLYETHYLENE GLYCOL 3350 17 G PO PACK: 17.0000 g | PACK | Freq: Every day | ORAL | 0 refills | Status: DC | PRN

## 2019-11-08 MED ORDER — POLYETHYLENE GLYCOL 3350 17 G PO PACK: 17.0000 g | PACK | Freq: Every day | ORAL | Status: DC | PRN

## 2019-11-08 MED ORDER — FUROSEMIDE 40 MG PO TABS: 40.0000 mg | ORAL_TABLET | Freq: Every day | ORAL | 1 refills | Status: AC

## 2019-11-08 NOTE — TOC Transition Note (Signed)
Transition of Care [facility]) - CM/SW Discharge Note   Patient Details  Name: Casaundra Takacs MRN: [id number] Date of Birth: 03-Dec-1926  Transition of Care [facility]) CM/SW Contact:  Lucy Chris, LCSW Phone Number: 11/08/2019, 2:42 PM   Clinical Narrative:   Son wants to go home today, they have an appointment and don't want to miss it. Aware need to wait for O2 to be delivered before DC. [employer] to deliver. Pt to return home with her son and daughter in-law. Has other equipment needs. No further follow due to DC today.    Final next level of care: Home w Home Health Services Barriers to Discharge: Barriers Resolved   Patient Goals and CMS Choice Patient states their goals for this hospitalization and ongoing recovery are:: Return home with son and family      Discharge Placement                Patient to be transferred to facility by: Via car by son Name of family member notified: Alex Patient and family notified of of transfer: 11/08/19  Discharge Plan and Services In-house Referral: Clinical Social Work              DME Arranged: Oxygen DME Agency: [employer] Date DME Agency Contacted: 11/08/19 Time DME Agency Contacted: 1420 Representative spoke with at DME Agency: Nida Boatman HH Arranged: PT, Nurse's Aide HH Agency: [facility] Date [facility] Agency Contacted: 11/08/19 Time HH Agency Contacted: 1320 Representative spoke with at [facility] Agency: [place]  Social Determinants of Health (SDOH) Interventions     Readmission Risk Interventions No flowsheet data found.

## 2019-11-08 NOTE — Progress Notes (Signed)
Patient is at 88% O2 RA at rest.

## 2019-11-08 NOTE — Progress Notes (Signed)
SATURATION QUALIFICATIONS: (This note is used to comply with regulatory documentation for home oxygen)  Patient Saturations on Room Air at Rest = 92%  Patient Saturations on Room Air while Ambulating = 85%  Please briefly explain why patient needs home oxygen:  Patient ambulate well to bathroom, RA at 92%. Going back to bed, she drops to 85%-87%, SOB. Encouraged patient to deep breathe and she goes back to 90%-94% RA.

## 2019-11-08 NOTE — Evaluation (Signed)
Physical Therapy Evaluation Patient Details Name: Diana Powell MRN: [id number] DOB: [birth year] Today's Date: 11/08/2019   History of Present Illness  Diana Powell is a 86 y/o F with PMH: afib (on Eliquis), COPD (not on home O2), OA, PPM, R THA, L wrist fracture d/t fall, and recent hospitalization in February d/t cholecystitis and now with tube removed just prior to this hospitalization on 10/30/19. Pt presented on this hospitalization with SOB. Pt with acute respiratory failure, now requiring 2Lnc.    Clinical Impression  Session supervised by translator Marciano Sequin ID number [id number]. Pt agreeable to PT this afternoon. Pt found on RA and requested to use bathroom upon author's arrival. After ambulating to the bathroom and back (20'), pt's SpO2 was 80% and pt took several minutes to catch her breath. Pt's SpO2 stayed in the low-mid 80's for several minutes and with cueing for deep breathing, pt's SpO2 reached a high of 91% after roughly 3-5 minutes post-ambulation. Supplemental oxygen applied per nsg and pt's SpO2 reached 97%. After ambulating 20 feet with 2LO2.min, pt's SpO2 was 96%. On 1LO2/min, pt ambulated 20 feet and her SpO2 remained 93% or higher. Next, pt ambulated 160 feet on 1LO2/min and her SpO2 was 93 when she finished and it briefly dropped down to a low of 91% but returned to the mid-90's with cueing for taking deep breaths. Pt ambulate with a RW without any physical assistance and was generally safe with no instance of LOB. Pt appeared to have limited endurance and was visibly fatigued at the end of the session. Pt will benefit from OPPT services upon discharge to safely address deficits listed in patient problem list for decreased caregiver assistance and eventual return to PLOF.       Follow Up Recommendations Outpatient PT;Supervision for mobility/OOB    Equipment Recommendations  None recommended by PT    Recommendations for Other Services       Precautions / Restrictions  Precautions Precautions: Fall Restrictions Weight Bearing Restrictions: No      Mobility  Bed Mobility Overal bed mobility: Modified Independent             General bed mobility comments: HOB elevated for sup to sit  Transfers Overall transfer level: Needs assistance Equipment used: Rolling walker (2 wheeled) Transfers: Sit to/from Stand Sit to Stand: Supervision         General transfer comment: Management of oxygen tubing  Ambulation/Gait Ambulation/Gait assistance: Supervision Gait Distance (Feet): 160 Feetx1, 20 Feet x3 Assistive device: Rolling walker (2 wheeled) Gait Pattern/deviations: Trunk flexed;Narrow base of support Gait velocity: decreased   General Gait Details: Ambulates with trunk flexion with no LOB or instances of instability  Stairs            Wheelchair Mobility    Modified Rankin (Stroke Patients Only)       Balance Overall balance assessment: Mild deficits observed, not formally tested                                           Pertinent Vitals/Pain Pain Assessment: No/denies pain(Pt did not have any signs, symptoms, or reports of pain.) Pain Location: abdomen near tube/gallbladder removal site Pain Descriptors / Indicators: Grimacing Pain Intervention(s): Monitored during session    Home Living Per OT note: Family/patient expects to be discharged to:: Private residence Living Arrangements: Children(son and DIL) Available Help at Discharge: Family;Available PRN/intermittently;Personal care attendant(PCA  in the mornings for ADL assistance.) Type of Home: House Home Access: Stairs to enter   [employer] of Steps: 2, 2 inch steps Home Layout: Two level;Able to live on main level with bedroom/bathroom Home Equipment: Dan Humphreys - 2 wheels;Cane - single point;Grab bars - tub/shower;Grab bars - toilet;Bedside commode;Wheelchair - manual      Prior Function Level of Independence: Needs assistance    Gait / Transfers Assistance Needed: Alternates SPC/RW for community mobility, son describes "furniture walking" in the household  ADL's / Homemaking Assistance Needed: family or aide assists with dressing/bathing ADLs. Son reports family can attend to her own toileting/grooming.        Hand Dominance        Extremity/Trunk Assessment        Lower Extremity Assessment Lower Extremity Assessment: Generalized weakness    Cervical / Trunk Assessment Cervical / Trunk Assessment: Kyphotic  Communication   Communication: Prefers language other than English;Other (comment);HOH(russian)  Cognition Arousal/Alertness: Awake/alert Behavior During Therapy: WFL for tasks assessed/performed Overall Cognitive Status: History of cognitive impairments - at baseline                                 General Comments: Per OT note: Son reports that she has some difficulty with remembering/accurately reporting medical history, so he helps with that. States she is generally oriented, aware she is in hospital.      General Comments      Exercises     Assessment/Plan    PT Assessment Patient needs continued PT services  PT Problem List Decreased strength;Decreased mobility;Decreased activity tolerance;Cardiopulmonary status limiting activity;Decreased balance       PT Treatment Interventions DME instruction;Therapeutic exercise;Gait training;Stair training;Therapeutic activities;Functional mobility training;Balance training    PT Goals (Current goals can be found in the Care Plan section)  Acute Rehab PT Goals Patient Stated Goal: Per OT: to be able to leave hospital and resume outpt therapy PT Goal Formulation: With patient Time For Goal Achievement: 11/21/19 Potential to Achieve Goals: Good    Frequency Min 2X/week   Barriers to discharge        Co-evaluation               AM-PAC PT "6 Clicks" Mobility  Outcome Measure Help needed turning from your back  to your side while in a flat bed without using bedrails?: None Help needed moving from lying on your back to sitting on the side of a flat bed without using bedrails?: None Help needed moving to and from a bed to a chair (including a wheelchair)?: A Little Help needed standing up from a chair using your arms (e.g., wheelchair or bedside chair)?: A Little Help needed to walk in hospital room?: A Little Help needed climbing 3-5 steps with a railing? : A Little 6 Click Score: 20    End of Session Equipment Utilized During Treatment: Gait belt;Oxygen Activity Tolerance: Patient tolerated treatment well Patient left: in bed;with family/visitor present Nurse Communication: Mobility status;Other (comment)(Oxygen saturation levels during ambulation) PT Visit Diagnosis: Muscle weakness (generalized) (M62.81)    Time: 1341-1420 PT Time Calculation (min) (ACUTE ONLY): 39 min   Charges:              Veleta Miners, SPT 11/08/19 2:52 PM

## 2019-11-08 NOTE — Discharge Summary (Signed)
Physician Discharge Summary  Diana Powell VZC:[id number] DOB: 1927/01/26 DOA: 11/05/2019  PCP: Lollie Marrow, MD  Admit date: 11/05/2019 Discharge date: 11/08/2019  Admitted From: Home Disposition:  Home  Recommendations for Outpatient Follow-up:  1. Follow up with PCP in 1-2 weeks 2. Follow-up with cardiology. 3. Please obtain BMP/CBC in one week 4. Please follow up on the following pending results: None  Home Health:yes Equipment/Devices: Home oxygen Discharge Condition: Fair CODE STATUS: Full  Diet recommendation: Heart Healthy   Brief/Interim Summary: Diana Zubovais a 86 y.o.Russian-speaking femalewith medical history significant forA. fib on Eliquis, systolic heart failure, EF 45 to 50% February 2021, advanced COPD, CKD 3, status post recent laparoscopic cholecystectomy on 10/30/2019, who presents to the emergency room with a 3-day history of shortness of breath not responding to home bronchodilator therapy. Has no chest pain cough fever or chills. No abdominal pain vomiting or diarrhea and received the Covid vaccine. She was hypoxic at 88% with EMS improving to the mid 90s on 3 L. She received DuoNebin route. CTA negative for PE but did show some pulmonary edema.  Admitted for hypoxic respiratory failure most likely secondary to CHF exacerbation. She was mostly accompanied by her son who also help as interpreter.  According to him she is having progressively worsening dyspnea with minor exertion and general decline in her health over the past 1 year.  According to him she is worse at night and does not feel refreshed in the morning.  Denies any morning headaches.  She never had a sleep study done but nursing staff noticed desaturation while sleeping.  She was ambulated by PT and desaturate with ambulation requiring 2 to 3 L.  She was discharged with home oxygen which she will need to use with ambulation and sleep.  Patient was initially treated with IV diuretic for a possible  CHF exacerbation.  BNP was mildly elevated at 291 with positive JVD and bilateral basal crackles which improved with diuresis.According to her cardiologist note in March 2021 her dry weight is 120 pound.  Recent decreased in her EF to 45 to 50% on echo done in February 2021 with some regional wall motion abnormalities, prior echo with normal EF and grade 2 diastolic dysfunction.  She weighs 123 pounds on admission and 118 pounds on discharge.  Cardiology was consulted and they do not have anything else to add.  She will follow up with them as an outpatient. Appears euvolemic on discharge and sent home on p.o. Lasix 40 mg daily with instructions to add another dose if she sees a change in her weight of 3 pound in 1 day or 5 pounds in 1 week.  He was also treated with scheduled DuoNeb and started on a prednisone for a possible COPD exacerbation, which seems less likely as there was no wheezing. Prednisone did not continue on discharge.  Patient has an history of AV ablation done in 2016 and a pacemaker placed.  Heart rate remained stable and she will continue home dose of Eliquis which was recently reduced to 2.5 mg twice daily according to her age.  Patient underwent uneventful laparoscopic cholecystectomy on 10/30/2019.  No acute concerns.  Patient also had physical deconditioning and will ordered home health services to help with her endurance and medical management.  Discharge Diagnoses:  Principal Problem:   Acute respiratory failure with hypoxia (HCC) Active Problems:   AF (paroxysmal atrial fibrillation) (HCC)   COPD with acute exacerbation (HCC)   S/P laparoscopic cholecystectomy 10/30/19  CHF (congestive heart failure) (HCC)   CKD (chronic kidney disease) stage 3, GFR 30-59 ml/min   Acute on chronic systolic CHF (congestive heart failure) (HCC)   Diastolic CHF (HCC)   Malnutrition of moderate degree  Discharge Instructions  Discharge Instructions    Diet - low sodium heart healthy    Complete by: As directed    Discharge instructions   Complete by: As directed    It was pleasure taking care of you. It is important that you use oxygen while sleeping and with ambulation. Please weigh yourself daily, and if you see an increase in weight of 3 pounds in 1 day or 5 pound in 1 week then take an extra  Lasix. Please follow-up with your primary care physician and cardiologist for further management.   Increase activity slowly   Complete by: As directed      Allergies as of 11/08/2019   No Known Allergies     Medication List    TAKE these medications   acetaminophen 500 MG tablet Commonly known as: TYLENOL Take 2 tablets (1,000 mg total) by mouth every 6 (six) hours as needed for mild pain or moderate pain. Additional  500 mg if needed   apixaban 2.5 MG Tabs tablet Commonly known as: ELIQUIS Take 2.5 mg by mouth in the morning and at bedtime.   budesonide 0.5 MG/2ML nebulizer solution Commonly known as: PULMICORT Take 0.5 mg by nebulization in the morning and at bedtime.   calcium-vitamin D 500-200 MG-UNIT tablet Take 1 tablet by mouth daily.   Depend Easy Fit Undergarments Misc Please provide pull-ups, gloves, and liners. Use as needed   Harmonie Underpad Misc Use as needed for incontinence.   Eye Drops 0.05 % ophthalmic solution Generic drug: tetrahydrozoline Place 1 drop into both eyes daily as needed (dry eye).   ferrous sulfate 325 (65 FE) MG tablet Take 325 mg by mouth at bedtime.   formoterol 20 MCG/2ML nebulizer solution Commonly known as: PERFOROMIST Inhale 20 mcg into the lungs every 12 (twelve) hours.   furosemide 40 MG tablet Commonly known as: LASIX Take 1 tablet (40 mg total) by mouth daily. Take additional 40 mg in morning if needed What changed: when to take this   ibuprofen 400 MG tablet Commonly known as: ADVIL Take 1 tablet (400 mg total) by mouth every 6 (six) hours as needed for moderate pain.   ipratropium-albuterol 0.5-2.5  (3) MG/3ML Soln Commonly known as: DUONEB Inhale 3 mLs into the lungs every 8 (eight) hours as needed.   magnesium oxide 400 MG tablet Commonly known as: MAG-OX Take 400 mg by mouth daily.   oxyCODONE 5 MG immediate release tablet Commonly known as: Oxy IR/ROXICODONE Take 1 tablet (5 mg total) by mouth every 6 (six) hours as needed for severe pain.   polyethylene glycol 17 g packet Commonly known as: MIRALAX / GLYCOLAX Take 17 g by mouth daily as needed for mild constipation or moderate constipation.   potassium chloride 10 MEQ tablet Commonly known as: KLOR-CON Take 10 mEq by mouth daily.   RA Extended Cuff Nitrile Glove Misc Gloves as indicated for incontinence care   vitamin B-12 1000 MCG tablet Commonly known as: CYANOCOBALAMIN Take 1,000 mcg by mouth at bedtime.            Durable Medical Equipment  (From admission, onward)         Start     Ordered   11/08/19 1411  For home use only DME oxygen  Once    Question Answer Comment  Length of Need Lifetime   Mode or (Route) Nasal cannula   Liters per Minute 2   Frequency Continuous (stationary and portable oxygen unit needed)   Oxygen conserving device Yes   Oxygen delivery system Gas      11/08/19 1410         Follow-up Information    [facility] Follow up on 11/20/2019.   Specialty: Cardiology Why: at 1:00pm. Enter through the [facility] entrance Contact information: [address] [place] [address] [phone]         No Known Allergies  Consultations:  Cardiology  Procedures/Studies: CT Angio Chest PE W and/or Wo Contrast  Result Date: 11/06/2019 CLINICAL DATA:  Shortness of breath EXAM: CT ANGIOGRAPHY CHEST WITH CONTRAST TECHNIQUE: Multidetector CT imaging of the chest was performed using the standard protocol during bolus administration of intravenous contrast. Multiplanar CT image reconstructions and MIPs were  obtained to evaluate the vascular anatomy. CONTRAST:  7mL OMNIPAQUE IOHEXOL 350 MG/ML SOLN COMPARISON:  10/09/2007 FINDINGS: Cardiovascular: No filling defects in the pulmonary arteries to suggest pulmonary emboli. Cardiomegaly. Diffuse aortic calcifications. No evidence of aortic aneurysm. Tortuosity of the thoracic aorta. Mediastinum/Nodes: No mediastinal, hilar, or axillary adenopathy. Trachea and esophagus are unremarkable. Lungs/Pleura: Scattered ground-glass opacities in the lungs may reflect early edema. No confluent opacities. No effusions. Upper Abdomen: Imaging into the upper abdomen shows no acute findings. Musculoskeletal: Chest wall soft tissues are unremarkable. Scoliosis and degenerative changes in the spine. No acute bony abnormality. Review of the MIP images confirms the above findings. IMPRESSION: No evidence of pulmonary embolus. Cardiomegaly. Scattered ground-glass opacities within the lungs could reflect early edema. Aortic Atherosclerosis (ICD10-I70.0). Electronically Signed   By: Charlett Nose M.D.   On: 11/06/2019 02:46   DG Chest Portable 1 View  Result Date: 11/06/2019 CLINICAL DATA:  Shortness of breath EXAM: PORTABLE CHEST 1 VIEW COMPARISON:  09/04/2019 FINDINGS: Left pacer remains in place, unchanged. Cardiomegaly. No overt edema. No confluent opacities or effusions. Severe thoracolumbar scoliosis. IMPRESSION: Cardiomegaly.  No active disease. Electronically Signed   By: Charlett Nose M.D.   On: 11/06/2019 00:10     Subjective: Patient continued to feel tired and having dyspnea with exertion.  She was accompanied by her son in the room and they want to go home today as she is having a medical appointment tomorrow which they do not want to miss.  Discharge Exam: Vitals:   11/08/19 1100 11/08/19 1115  BP:    Pulse:    Resp:    Temp:    SpO2: (!) 83% 92%   Vitals:   11/08/19 0839 11/08/19 0933 11/08/19 1100 11/08/19 1115  BP: (!) 115/57     Pulse: 70     Resp: 16      Temp: (!) 97 F (36.1 C)     TempSrc: Axillary     SpO2: 99% 93% (!) 83% 92%  Weight:      Height:        General: Pt is frail elderly lady, alert, awake, not in acute distress Cardiovascular: RRR, S1/S2 +, no rubs, no gallops Respiratory: CTA bilaterally, no wheezing, no rhonchi Abdominal: Soft, NT, ND, bowel sounds + Extremities: no edema, no cyanosis   The results of significant diagnostics from this hospitalization (including imaging, microbiology, ancillary and laboratory) are listed below for reference.    Microbiology: Recent Results (from the past 240 hour(s))  Blood culture (routine x 2)     Status: None (Preliminary result)   Collection Time: 11/06/19 12:04 AM   Specimen: BLOOD  Result Value Ref Range Status   Specimen Description BLOOD LFOA  Final   Special Requests BOTTLES DRAWN AEROBIC AND ANAEROBIC BCLV  Final   Culture   Final    NO GROWTH 2 DAYS Performed at [facility], [address], [place], [place] [address]    Report Status PENDING  Incomplete  Blood culture (routine x 2)     Status: None (Preliminary result)   Collection Time: 11/06/19 12:04 AM   Specimen: BLOOD  Result Value Ref Range Status   Specimen Description BLOOD RAC  Final   Special Requests BOTTLES DRAWN AEROBIC AND ANAEROBIC BCAV  Final   Culture   Final    NO GROWTH 2 DAYS Performed at [facility], [address] [place], [place] [address]    Report Status PENDING  Incomplete  Respiratory Panel by RT PCR (Flu A&B, Covid) - Nasopharyngeal Swab     Status: None   Collection Time: 11/06/19 12:58 AM   Specimen: Nasopharyngeal Swab  Result Value Ref Range Status   SARS Coronavirus 2 by RT PCR NEGATIVE NEGATIVE Final    Comment: (NOTE) SARS-CoV-2 target nucleic acids are NOT DETECTED. The SARS-CoV-2 RNA is generally detectable in upper respiratoy specimens during the acute phase of infection. The lowest concentration of SARS-CoV-2 viral copies this assay can  detect is 131 copies/mL. A negative result does not preclude SARS-Cov-2 infection and should not be used as the sole basis for treatment or other patient management decisions. A negative result may occur with  improper specimen collection/handling, submission of specimen other than nasopharyngeal swab, presence of viral mutation(s) within the areas targeted by this assay, and inadequate number of viral copies (<131 copies/mL). A negative result must be combined with clinical observations, patient history, and epidemiological information. The expected result is Negative. Fact Sheet for Patients:  https://www.moore.com/ Fact Sheet for Healthcare Providers:  https://www.young.biz/ This test is not yet ap proved or cleared by the [place] FDA and  has been authorized for detection and/or diagnosis of SARS-CoV-2 by FDA under an Emergency Use Authorization (EUA). This EUA will remain  in effect (meaning this test can be used) for the duration of the COVID-19 declaration under Section 564(b)(1) of the Act, 21 U.S.C. section 360bbb-3(b)(1), unless the authorization is terminated or revoked sooner.    Influenza A by PCR NEGATIVE NEGATIVE Final   Influenza B by PCR NEGATIVE NEGATIVE Final    Comment: (NOTE) The Xpert Xpress SARS-CoV-2/FLU/RSV assay is intended as an aid in  the diagnosis of influenza from Nasopharyngeal swab specimens and  should not be used as a sole basis for treatment. Nasal washings and  aspirates are unacceptable for Xpert Xpress SARS-CoV-2/FLU/RSV  testing. Fact Sheet for Patients: https://www.moore.com/ Fact Sheet for Healthcare Providers: https://www.young.biz/ This test is not yet approved or cleared by the [place] FDA and  has been authorized for detection and/or diagnosis of SARS-CoV-2 by  FDA under an Emergency Use Authorization (EUA). This EUA will remain  in effect (meaning  this test can be used) for the duration of the  Covid-19 declaration under Section 564(b)(1) of the Act, 21  U.S.C. section 360bbb-3(b)(1), unless the authorization is  terminated or revoked. Performed at [facility], [address] [place], [place] [address]      Labs: BNP (last 3 results) Recent Labs  11/05/19 2349  BNP 431.5*   Basic Metabolic Panel: Recent Labs  Lab 11/05/19 2349 11/07/19 0412 11/08/19 0430  NA 142 142 143  K 3.9 3.4* 3.8  CL 98 99 100  CO2 31 33* 34*  GLUCOSE 132* 188* 126*  BUN 17 22 33*  CREATININE 1.23* 0.91 1.15*  CALCIUM 8.8* 8.1* 8.5*   Liver Function Tests: Recent Labs  Lab 11/05/19 2349  AST 28  ALT 15  ALKPHOS 68  BILITOT 0.6  PROT 7.1  ALBUMIN 3.6   No results for input(s): LIPASE, AMYLASE in the last 168 hours. No results for input(s): AMMONIA in the last 168 hours. CBC: Recent Labs  Lab 11/05/19 2349 11/07/19 0412 11/08/19 0430  WBC 6.2 5.0 7.8  NEUTROABS 3.5  --   --   HGB 10.5* 8.9* 8.8*  HCT 34.8* 29.4* 29.0*  MCV 93.3 92.7 91.8  PLT 282 245 266   Cardiac Enzymes: No results for input(s): CKTOTAL, CKMB, CKMBINDEX, TROPONINI in the last 168 hours. BNP: Invalid input(s): POCBNP CBG: No results for input(s): GLUCAP in the last 168 hours. D-Dimer No results for input(s): DDIMER in the last 72 hours. Hgb A1c No results for input(s): HGBA1C in the last 72 hours. Lipid Profile No results for input(s): CHOL, HDL, LDLCALC, TRIG, CHOLHDL, LDLDIRECT in the last 72 hours. Thyroid function studies No results for input(s): TSH, T4TOTAL, T3FREE, THYROIDAB in the last 72 hours.  Invalid input(s): FREET3 Anemia work up Recent Labs    11/08/19 0430  FOLATE 8.2  FERRITIN 36  TIBC 235*  IRON 24*  RETICCTPCT 1.8   Urinalysis    Component Value Date/Time   COLORURINE YELLOW (A) 08/31/2019 2122   APPEARANCEUR HAZY (A) 08/31/2019 2122   LABSPEC 1.030 08/31/2019 2122   PHURINE 6.0 08/31/2019 2122    GLUCOSEU NEGATIVE 08/31/2019 2122   HGBUR SMALL (A) 08/31/2019 2122   BILIRUBINUR NEGATIVE 08/31/2019 2122   KETONESUR NEGATIVE 08/31/2019 2122   PROTEINUR NEGATIVE 08/31/2019 2122   NITRITE NEGATIVE 08/31/2019 2122   LEUKOCYTESUR LARGE (A) 08/31/2019 2122   Sepsis Labs Invalid input(s): PROCALCITONIN,  WBC,  LACTICIDVEN Microbiology Recent Results (from the past 240 hour(s))  Blood culture (routine x 2)     Status: None (Preliminary result)   Collection Time: 11/06/19 12:04 AM   Specimen: BLOOD  Result Value Ref Range Status   Specimen Description BLOOD LFOA  Final   Special Requests BOTTLES DRAWN AEROBIC AND ANAEROBIC BCLV  Final   Culture   Final    NO GROWTH 2 DAYS Performed at [facility], [address] [place], [place] [address]    Report Status PENDING  Incomplete  Blood culture (routine x 2)     Status: None (Preliminary result)   Collection Time: 11/06/19 12:04 AM   Specimen: BLOOD  Result Value Ref Range Status   Specimen Description BLOOD RAC  Final   Special Requests BOTTLES DRAWN AEROBIC AND ANAEROBIC BCAV  Final   Culture   Final    NO GROWTH 2 DAYS Performed at [facility], [address], [place], [place] [address]    Report Status PENDING  Incomplete  Respiratory Panel by RT PCR (Flu A&B, Covid) - Nasopharyngeal Swab     Status: None   Collection Time: 11/06/19 12:58 AM   Specimen: Nasopharyngeal Swab  Result Value Ref Range Status   SARS Coronavirus 2 by RT PCR NEGATIVE NEGATIVE Final    Comment: (NOTE) SARS-CoV-2 target nucleic acids are NOT DETECTED.  The SARS-CoV-2 RNA is generally detectable in upper respiratoy specimens during the acute phase of infection. The lowest concentration of SARS-CoV-2 viral copies this assay can detect is 131 copies/mL. A negative result does not preclude SARS-Cov-2 infection and should not be used as the sole basis for treatment or other patient management decisions. A negative result may occur  with  improper specimen collection/handling, submission of specimen other than nasopharyngeal swab, presence of viral mutation(s) within the areas targeted by this assay, and inadequate number of viral copies (<131 copies/mL). A negative result must be combined with clinical observations, patient history, and epidemiological information. The expected result is Negative. Fact Sheet for Patients:  https://www.moore.com/https://www.fda.gov/media/142436/download Fact Sheet for Healthcare Providers:  https://www.young.biz/https://www.fda.gov/media/142435/download This test is not yet ap proved or cleared by the [place] FDA and  has been authorized for detection and/or diagnosis of SARS-CoV-2 by FDA under an Emergency Use Authorization (EUA). This EUA will remain  in effect (meaning this test can be used) for the duration of the COVID-19 declaration under Section 564(b)(1) of the Act, 21 U.S.C. section 360bbb-3(b)(1), unless the authorization is terminated or revoked sooner.    Influenza A by PCR NEGATIVE NEGATIVE Final   Influenza B by PCR NEGATIVE NEGATIVE Final    Comment: (NOTE) The Xpert Xpress SARS-CoV-2/FLU/RSV assay is intended as an aid in  the diagnosis of influenza from Nasopharyngeal swab specimens and  should not be used as a sole basis for treatment. Nasal washings and  aspirates are unacceptable for Xpert Xpress SARS-CoV-2/FLU/RSV  testing. Fact Sheet for Patients: https://www.moore.com/https://www.fda.gov/media/142436/download Fact Sheet for Healthcare Providers: https://www.young.biz/https://www.fda.gov/media/142435/download This test is not yet approved or cleared by the [place] FDA and  has been authorized for detection and/or diagnosis of SARS-CoV-2 by  FDA under an Emergency Use Authorization (EUA). This EUA will remain  in effect (meaning this test can be used) for the duration of the  Covid-19 declaration under Section 564(b)(1) of the Act, 21  U.S.C. section 360bbb-3(b)(1), unless the authorization is  terminated or revoked. Performed  at [facility], [address] [place], [place] [address]     Time coordinating discharge: Over 30 minutes  SIGNED:  Arnetha CourserSumayya Pansie Guggisberg, MD  [facility] 11/08/2019, 2:16 PM  If 7PM-7AM, please contact night-coverage [email or web address]  This record has been created using Conservation officer, historic buildingsDragon voice recognition software. Errors have been sought and corrected,but may not always be located. Such creation errors do not reflect on the standard of care.

## 2019-11-08 NOTE — TOC Progression Note (Addendum)
Transition of Care [facility]) - Progression Note    Patient Details  Name: Diana Powell MRN: [id number] Date of Birth: 09/11/1926  [facility] of [facility] CM/SW Contact  Denora Wysocki, Lemar Livings, LCSW Phone Number: 11/08/2019, 2:15 PM  Clinical Narrative:  Pt in PT session does qualify for for home O2. Have contacted [employer] to order home O2. Son in room and aware. [facility] aware to talk with son. Trying to find a home health agency to provide HHPT and aide. Son wants to discharge today if possible. MD aware of this.  2:30 PM [facility] will take the referral for PT and aide.  Expected Discharge Plan: Home/Self Care    Expected Discharge Plan and Services Expected Discharge Plan: Home/Self Care In-house Referral: Clinical Social Work     Living arrangements for the past 2 months: Single Family Home                                       Social Determinants of Health (SDOH) Interventions    Readmission Risk Interventions No flowsheet data found.

## 2019-11-08 NOTE — Progress Notes (Signed)
[facility] Cardiology [facility] Encounter Note  Patient: Diana Powell / Admit Date: 11/05/2019 / Date of Encounter: 11/08/2019, 1:26 PM   Subjective: Patient is comfortable lying down today but still little short of breath with physical activity.  Patient does have reasonable heart rate control with permanent atrial fibrillation without evidence of need for additional medication management.  Patient remains on anticoagulation without any bleeding complications.  Exam suggest that the patient does have bibasilar Rales with some hypoxia needing oxygenation.  CT scan suggesting possible pulmonary edema but chest x-ray does not show any overt pulmonary edema.  Currently no evidence of myocardial infarction or chest discomfort  Review of Systems: Positive for: Shortness of breath Negative for: Vision change, hearing change, syncope, dizziness, nausea, vomiting,diarrhea, bloody stool, stomach pain, cough, congestion, diaphoresis, urinary frequency, urinary pain,skin lesions, skin rashes Others previously listed  Objective: Telemetry: Atrial fibrillation Physical Exam: Blood pressure (!) 115/57, pulse 70, temperature (!) 97 F (36.1 C), temperature source Axillary, resp. rate 16, height 5' (1.524 m), weight 54.4 kg, SpO2 92 %. Body mass index is 23.42 kg/m. General: Well developed, well nourished, in no acute distress. Head: Normocephalic, atraumatic, sclera non-icteric, no xanthomas, nares are without discharge. Neck: No apparent masses Lungs: Normal respirations with few wheezes, no rhonchi, no rales , no lower crackles   Heart: Regular rate and rhythm, normal S1 S2, no murmur, no rub, no gallop, PMI is normal size and placement, carotid upstroke normal without bruit, jugular venous pressure normal Abdomen: Soft, non-tender, non-distended with normoactive bowel sounds. No hepatosplenomegaly. Abdominal aorta is normal size without bruit Extremities: No edema, no clubbing, no cyanosis, no ulcers,   Peripheral: 2+ radial, 2+ femoral, 2+ dorsal pedal pulses Neuro: Alert and oriented. Moves all extremities spontaneously. Psych:  Responds to questions appropriately with a normal affect.   Intake/Output Summary (Last 24 hours) at 11/08/2019 1326 Last data filed at 11/08/2019 0853 Gross per 24 hour  Intake 10 ml  Output 350 ml  Net -340 ml    Inpatient Medications:  . apixaban  2.5 mg Oral BID  . arformoterol  15 mcg Nebulization Q12H  . budesonide  0.5 mg Nebulization BID  . furosemide  40 mg Oral Daily  . ipratropium-albuterol  3 mL Nebulization Q6H  . multivitamin with minerals  1 tablet Oral Daily  . predniSONE  40 mg Oral Q breakfast  . sodium chloride flush  3 mL Intravenous Q12H   Infusions:  . sodium chloride      Labs: Recent Labs    11/07/19 0412 11/08/19 0430  NA 142 143  K 3.4* 3.8  CL 99 100  CO2 33* 34*  GLUCOSE 188* 126*  BUN 22 33*  CREATININE 0.91 1.15*  CALCIUM 8.1* 8.5*   Recent Labs    11/05/19 2349  AST 28  ALT 15  ALKPHOS 68  BILITOT 0.6  PROT 7.1  ALBUMIN 3.6   Recent Labs    11/05/19 2349 11/05/19 2349 11/07/19 0412 11/08/19 0430  WBC 6.2   < > 5.0 7.8  NEUTROABS 3.5  --   --   --   HGB 10.5*   < > 8.9* 8.8*  HCT 34.8*   < > 29.4* 29.0*  MCV 93.3   < > 92.7 91.8  PLT 282   < > 245 266   < > = values in this interval not displayed.   No results for input(s): CKTOTAL, CKMB, TROPONINI in the last 72 hours. Invalid input(s): POCBNP No results  for input(s): HGBA1C in the last 72 hours.   Weights: Filed Weights   11/06/19 2101 11/07/19 0451 11/08/19 0500  Weight: 55.8 kg 55.8 kg 54.4 kg     Radiology/Studies:  CT Angio Chest PE W and/or Wo Contrast  Result Date: 11/06/2019 CLINICAL DATA:  Shortness of breath EXAM: CT ANGIOGRAPHY CHEST WITH CONTRAST TECHNIQUE: Multidetector CT imaging of the chest was performed using the standard protocol during bolus administration of intravenous contrast. Multiplanar CT image  reconstructions and MIPs were obtained to evaluate the vascular anatomy. CONTRAST:  95mL OMNIPAQUE IOHEXOL 350 MG/ML SOLN COMPARISON:  10/09/2007 FINDINGS: Cardiovascular: No filling defects in the pulmonary arteries to suggest pulmonary emboli. Cardiomegaly. Diffuse aortic calcifications. No evidence of aortic aneurysm. Tortuosity of the thoracic aorta. Mediastinum/Nodes: No mediastinal, hilar, or axillary adenopathy. Trachea and esophagus are unremarkable. Lungs/Pleura: Scattered ground-glass opacities in the lungs may reflect early edema. No confluent opacities. No effusions. Upper Abdomen: Imaging into the upper abdomen shows no acute findings. Musculoskeletal: Chest wall soft tissues are unremarkable. Scoliosis and degenerative changes in the spine. No acute bony abnormality. Review of the MIP images confirms the above findings. IMPRESSION: No evidence of pulmonary embolus. Cardiomegaly. Scattered ground-glass opacities within the lungs could reflect early edema. Aortic Atherosclerosis (ICD10-I70.0). Electronically Signed   By: Rolm Baptise M.D.   On: 11/06/2019 02:46   DG Chest Portable 1 View  Result Date: 11/06/2019 CLINICAL DATA:  Shortness of breath EXAM: PORTABLE CHEST 1 VIEW COMPARISON:  09/04/2019 FINDINGS: Left pacer remains in place, unchanged. Cardiomegaly. No overt edema. No confluent opacities or effusions. Severe thoracolumbar scoliosis. IMPRESSION: Cardiomegaly.  No active disease. Electronically Signed   By: Rolm Baptise M.D.   On: 11/06/2019 00:10     Assessment and Recommendation  86 y.o. female with known chronic atrial fibrillation mild to moderate aortic valve stenosis with acute diastolic dysfunction congestive heart failure with pulmonary edema and hypoxia slowly improving with intravenous Lasix without evidence of myocardial infarction 1.  Continue furosemide intravenously to improve pulmonary edema hypoxia from diastolic dysfunction heart failure 2.  No additional medication  management for atrial fibrillation currently well controlled 3.  No further cardiac diagnostics necessary at this time stable 4.  Anticoagulation for further risk reduction of stroke with atrial fibrillation 5.  Begin ambulation and follow-up for improvements of symptoms and okay for discharge to home when pulmonary edema improved  Signed, Serafina Royals M.D. FACC

## 2019-11-09 NOTE — Anesthesia Postprocedure Evaluation (Signed)
Anesthesia Post Note  Patient: Diana Powell  Procedure(s) Performed: XI ROBOTIC ASSISTED LAPAROSCOPIC CHOLECYSTECTOMY w/ICG Cholangiogram (N/A ) INDOCYANINE GREEN FLUORESCENCE IMAGING (ICG) (N/A )  Patient location during evaluation: PACU Anesthesia Type: General Level of consciousness: awake and alert Pain management: pain level controlled Vital Signs Assessment: post-procedure vital signs reviewed and stable Respiratory status: spontaneous breathing, nonlabored ventilation, respiratory function stable and patient connected to nasal cannula oxygen Cardiovascular status: blood pressure returned to baseline and stable Postop Assessment: no apparent nausea or vomiting Anesthetic complications: no     Last Vitals:  Vitals:   10/30/19 1510 10/30/19 1525  BP: (!) 147/67 (!) 140/59  Pulse: 70 88  Resp: (!) 29 (!) 24  Temp:  (!) 36.3 C  SpO2: 90%     Last Pain:  Vitals:   10/30/19 1525  TempSrc: Temporal  PainSc:                  Yevette Edwards

## 2019-11-11 LAB — CULTURE, BLOOD (ROUTINE X 2)
Culture: NO GROWTH
Culture: NO GROWTH

## 2019-11-14 ENCOUNTER — Other Ambulatory Visit: Payer: Self-pay

## 2019-11-14 ENCOUNTER — Ambulatory Visit (INDEPENDENT_AMBULATORY_CARE_PROVIDER_SITE_OTHER): Payer: Medicare Other | Admitting: Surgery

## 2019-11-14 ENCOUNTER — Encounter: Payer: Self-pay | Admitting: Surgery

## 2019-11-14 VITALS — BP 144/68 | HR 69 | Temp 97.9°F | Resp 12 | Ht 60.0 in | Wt 121.0 lb

## 2019-11-14 DIAGNOSIS — K810 Acute cholecystitis: Secondary | ICD-10-CM

## 2019-11-14 DIAGNOSIS — Z09 Encounter for follow-up examination after completed treatment for conditions other than malignant neoplasm: Secondary | ICD-10-CM

## 2019-11-14 NOTE — Progress Notes (Signed)
11/14/2019  HPI: Diana Powell is a 86 y.o. female s/p robotic assisted cholecystectomy with ICG cholangiogram on 10/30/19.  She presents for follow up. In the interim, the patient was admitted from 4/19 to 4/22 with CHF exacerbation and COPD issues, improving on diuretics and steroids.  She saw her PCP last week and there's plan for a sleep study as well given her O2 issues at night when she sleeps.  From surgery standpoint, she initially had some pain at the incisions, but that has resolved.  She has some intermittent nausea after eating, and some occasional discomfort in the RUQ, but otherwise no issues.  Vital signs: BP (!) 144/68   Pulse 69   Temp 97.9 F (36.6 C)   Resp 12   Ht 5' (1.524 m)   Wt 121 lb (54.9 kg)   SpO2 98% Comment: 2L of O2  BMI 23.63 kg/m    Physical Exam: Constitutional:  No acute distress Abdomen:  Soft, non-distended, non-tender to palpation.  Incisions are clean, dry, intact, healing well.  Prior drain site healing well too.  No evidence of infection  Assessment/Plan: This is a 86 y.o. female s/p robotic assisted cholecystectomy and ICG cholangiogram.  --Discussed with the patient that she's healing well.  I think the nausea could be related to her body's adjusting to not having a gallbladder and being bloated or fuller.  Recommended that they try smaller more frequent meals to help. --With regards to the RUQ discomfort, may be related to scar tissue or inflammation that's lingering.  This will continue to improve. --Follow up prn.   Howie Ill, MD [facility]

## 2019-11-14 NOTE — Patient Instructions (Addendum)
As discussed, you may start doing smaller, frequent meals to help with the fullness. You may continue to take Tylenol and Ibuprofen as needed for pain.   Follow up as needed. Call the office if you have any questions or concerns.   GENERAL POST-OPERATIVE PATIENT INSTRUCTIONS   WOUND CARE INSTRUCTIONS:  Keep a dry clean dressing on the wound if there is drainage. The initial bandage may be removed after 24 hours.  Once the wound has quit draining you may leave it open to air.  If clothing rubs against the wound or causes irritation and the wound is not draining you may cover it with a dry dressing during the daytime.  Try to keep the wound dry and avoid ointments on the wound unless directed to do so.  If the wound becomes bright red and painful or starts to drain infected material that is not clear, please contact your physician immediately.  If the wound is mildly pink and has a thick firm ridge underneath it, this is normal, and is referred to as a healing ridge.  This will resolve over the next 4-6 weeks.  BATHING: You may shower if you have been informed of this by your surgeon. However, Please do not submerge in a tub, hot tub, or pool until incisions are completely sealed or have been told by your surgeon that you may do so.  DIET:  You may eat any foods that you can tolerate.  It is a good idea to eat a high fiber diet and take in plenty of fluids to prevent constipation.  If you do become constipated you may want to take a mild laxative or take ducolax tablets on a daily basis until your bowel habits are regular.  Constipation can be very uncomfortable, along with straining, after recent surgery.  ACTIVITY:  You are encouraged to cough and deep breath or use your incentive spirometer if you were given one, every 15-30 minutes when awake.  This will help prevent respiratory complications and low grade fevers post-operatively if you had a general anesthetic.  You may want to hug a pillow when  coughing and sneezing to add additional support to the surgical area, if you had abdominal or chest surgery, which will decrease pain during these times.  You are encouraged to walk and engage in light activity for the next two weeks.  You should not lift more than 20 pounds, until 11/27/19 as it could put you at increased risk for complications.  Twenty pounds is roughly equivalent to a plastic bag of groceries. At that time- Listen to your body when lifting, if you have pain when lifting, stop and then try again in a few days. Soreness after doing exercises or activities of daily living is normal as you get back in to your normal routine.  MEDICATIONS:  Try to take narcotic medications and anti-inflammatory medications, such as tylenol, ibuprofen, naprosyn, etc., with food.  This will minimize stomach upset from the medication.  Should you develop nausea and vomiting from the pain medication, or develop a rash, please discontinue the medication and contact your physician.  You should not drive, make important decisions, or operate machinery when taking narcotic pain medication.  SUNBLOCK Use sun block to incision area over the next year if this area will be exposed to sun. This helps decrease scarring and will allow you avoid a permanent darkened area over your incision.  QUESTIONS:  Please feel free to call our office if you have any questions,  and we will be glad to assist you.

## 2019-11-19 NOTE — Progress Notes (Signed)
Patient ID: Diana Powell, female    DOB: 02-14-1927, 86 y.o.   MRN: [id number]  Visit done with the interpreter present throughout the entire visit.   HPI  Diana Powell is a 86 y/o female with a history of atrial fibrillation, COPD and chronic heart failure.   Echo report from 09/02/19 reviewed and showed an EF of 45-50% along with mild LVH, moderate MR/TR, mild AS and moderately elevated PA pressure.   Admitted 11/05/19 due to shortness of breath. Cardiology consult obtained. Nebulizers given. CTA negative for PE. PT evaluation done. Initially given IV lasix and then transitioned to oral diuretics. Discharged after 3 days. Had outpatient surgery 10/30/19.  Diana Powell presents today for her initial visit with a chief complaint of moderate shortness of breath upon minimal exertion. Diana Powell describes this as having been present for several months. Diana Powell has associated decreased appetite, fatigue, rhinorrhea, cough, light-headedness and generalized weakness along with this. Diana Powell denies any difficulty sleeping, abdominal distention, palpitations, pedal edema, chest pain or weight gain.   Wearing her oxygen at bedtime and during the day upon exertion as needed. Has recently had her diuretic increased by her PCP back to her original dose. Son is asking if a PT referral can be made.   Past Medical History:  Diagnosis Date  . Atrial fibrillation (HCC)   . CHF (congestive heart failure) (HCC)   . Chronic systolic heart failure (HCC)   . Complication of anesthesia    CONFUSION   . COPD (chronic obstructive pulmonary disease) (HCC)   . Osteoarthritis, multiple sites   . Osteoporosis, post-menopausal   . Presence of permanent cardiac pacemaker    Past Surgical History:  Procedure Laterality Date  . CARDIOVERSION  ~2009  . HIP FRACTURE SURGERY Bilateral [phone]   each hip fractured at separate times  . INSERT / REPLACE / REMOVE PACEMAKER    . IR EXCHANGE BILIARY DRAIN  10/05/2019  . TOTAL HIP ARTHROPLASTY Right  04/20/2018   Procedure: TOTAL HIP CONVERSION;  Surgeon: Kennedy Bucker, MD;  Location: [facility] ORS;  Service: Orthopedics;  Laterality: Right;   Family History  Problem Relation Age of Onset  . CAD Mother   . CVA Mother    Social History   Tobacco Use  . Smoking status: Never Smoker  . Smokeless tobacco: Never Used  Substance Use Topics  . Alcohol use: Not Currently   No Known Allergies Prior to Admission medications   Medication Sig Start Date End Date Taking? Authorizing Provider  acetaminophen (TYLENOL) 500 MG tablet Take 2 tablets (1,000 mg total) by mouth every 6 (six) hours as needed for mild pain or moderate pain. Additional  500 mg if needed 10/30/19  Yes Piscoya, Jose, MD  apixaban (ELIQUIS) 2.5 MG TABS tablet Take 2.5 mg by mouth in the morning and at bedtime. 10/26/19 10/25/20 Yes [provider]  budesonide (PULMICORT) 0.5 MG/2ML nebulizer solution Take 0.5 mg by nebulization in the morning and at bedtime.  01/23/18  Yes [provider]  Calcium Carbonate-Vitamin D (CALCIUM-VITAMIN D) 500-200 MG-UNIT per tablet Take 1 tablet by mouth daily.   Yes [provider]  Disposable Gloves (RA EXTENDED CUFF NITRILE GLOVE) MISC Gloves as indicated for incontinence care 09/12/19  Yes [provider]  ferrous sulfate 325 (65 FE) MG tablet Take 325 mg by mouth at bedtime.    Yes [provider]  formoterol (PERFOROMIST) 20 MCG/2ML nebulizer solution Inhale 20 mcg into the lungs every 12 (twelve) hours. 11/30/17  Yes  [provider]  furosemide (LASIX) 40 MG tablet Take 1 tablet (40 mg total) by mouth daily. Take additional 40 mg in morning if needed Patient taking differently: Take 80 mg by mouth daily. Patient is currently taking 80 mg QAM and 40 mg QPM 11/08/19  Yes Lorella Nimrod, MD  Incontinence Supply Disposable (DEPEND EASY FIT UNDERGARMENTS) MISC Please provide pull-ups, gloves, and liners. Use as needed 09/12/19  Yes [provider]   Incontinence Supply Disposable (HARMONIE UNDERPAD) MISC Use as needed for incontinence. 09/12/19  Yes [provider]  ipratropium-albuterol (DUONEB) 0.5-2.5 (3) MG/3ML SOLN Inhale 3 mLs into the lungs every 8 (eight) hours as needed. 08/24/19  Yes [provider]  magnesium oxide (MAG-OX) 400 MG tablet Take 400 mg by mouth daily.   Yes [provider]  potassium chloride (K-DUR) 10 MEQ tablet Take 10 mEq by mouth daily. 02/18/18  Yes [provider]  tetrahydrozoline (EYE DROPS) 0.05 % ophthalmic solution Place 1 drop into both eyes daily as needed (dry eye).   Yes [provider]  vitamin B-12 (CYANOCOBALAMIN) 1000 MCG tablet Take 1,000 mcg by mouth at bedtime.    Yes [provider]     Review of Systems  Constitutional: Positive for appetite change (decreased) and fatigue.  HENT: Positive for rhinorrhea (at times). Negative for congestion and sore throat.   Eyes: Negative.   Respiratory: Positive for cough and shortness of breath (easily).   Cardiovascular: Negative for chest pain, palpitations and leg swelling.  Gastrointestinal: Negative for abdominal distention and abdominal pain.  Endocrine: Negative.   Genitourinary: Negative.   Musculoskeletal: Negative for back pain and neck pain.  Skin: Negative.   Allergic/Immunologic: Negative.   Neurological: Positive for weakness and light-headedness. Negative for dizziness.  Hematological: Negative for adenopathy. Does not bruise/bleed easily.  Psychiatric/Behavioral: Negative for dysphoric mood and sleep disturbance (wearing oxygen bedtime and PRN during the day). The patient is not nervous/anxious.     Vitals:   11/20/19 1312  BP: (!) 113/52  Pulse: 70  Resp: 18  SpO2: 95%  Weight: 115 lb (52.2 kg)  Height: 5\' 4"  (1.626 m)   Wt Readings from Last 3 Encounters:  11/20/19 115 lb (52.2 kg)  11/14/19 121 lb (54.9 kg)  11/08/19 119 lb 14.9 oz (54.4 kg)   Lab Results  Component  Value Date   CREATININE 1.15 (H) 11/08/2019   CREATININE 0.91 11/07/2019   CREATININE 1.23 (H) 11/05/2019    Physical Exam Vitals and nursing note reviewed.  Constitutional:      Appearance: Diana Powell is well-developed.  HENT:     Head: Normocephalic and atraumatic.  Cardiovascular:     Rate and Rhythm: Normal rate and regular rhythm.  Pulmonary:     Effort: Pulmonary effort is normal. No respiratory distress.     Breath sounds: No wheezing or rales.  Abdominal:     Palpations: Abdomen is soft.     Tenderness: There is no abdominal tenderness.  Musculoskeletal:     Cervical back: Normal range of motion and neck supple.     Right lower leg: No tenderness. No edema.     Left lower leg: No tenderness. No edema.  Skin:    General: Skin is warm and dry.  Neurological:     General: No focal deficit present.     Mental Status: Diana Powell is alert and oriented to person, place, and time.  Psychiatric:        Mood and Affect: Mood normal.  Behavior: Behavior normal.    Assessment & Plan:  1: Chronic heart failure with mildly reduced ejection fraction- - NYHA class III - euvolemic today - weighing daily; reminded to call for an overnight weight gain of >2 pounds or a weekly weight gain of >5 pounds - not adding salt; says that Diana Powell has a decreased appetite although son disagrees with her about this - saw cardiology [facility]) 10/11/19 - BP on the low side today so doubt entresto could be tolerated - BNP 11/05/19 was 291.0 - PharmD reconciled medications with the patient - PT evaluation order placed per son's request; he would prefer to take her to PT as they've had "bad experiences" when home PT was ordered  2: COPD- - saw PCP Cora Daniels) 11/09/19; sleep study has been ordered - wearing oxygen at bedtime and as needed during the day with exertion - BMP 11/08/19 reviewed and showed sodium 143, potassium 3.8, creatinine 1.15 and GFR 42 - diuretic has recently been increased back to original  dose by PCP; will defer lab monitoring to him  3: Atrial fibrillation- - pacemaker present    Patient did not bring her medications nor a list. Each medication was verbally reviewed with the patient and Diana Powell was encouraged to bring the bottles to every visit to confirm accuracy of list.   Will not make a return appointment at this time. Advised patient and son that they could call back at anytime to make another appointment and they were comfortable with this plan.

## 2019-11-20 ENCOUNTER — Encounter: Payer: Self-pay | Admitting: Family

## 2019-11-20 ENCOUNTER — Ambulatory Visit: Payer: Medicare Other | Attending: Family | Admitting: Family

## 2019-11-20 ENCOUNTER — Other Ambulatory Visit: Payer: Self-pay

## 2019-11-20 VITALS — BP 113/52 | HR 70 | Resp 18 | Ht 64.0 in | Wt 115.0 lb

## 2019-11-20 DIAGNOSIS — R531 Weakness: Secondary | ICD-10-CM | POA: Diagnosis not present

## 2019-11-20 DIAGNOSIS — I5022 Chronic systolic (congestive) heart failure: Secondary | ICD-10-CM | POA: Diagnosis not present

## 2019-11-20 DIAGNOSIS — R0602 Shortness of breath: Secondary | ICD-10-CM | POA: Insufficient documentation

## 2019-11-20 DIAGNOSIS — J3489 Other specified disorders of nose and nasal sinuses: Secondary | ICD-10-CM | POA: Diagnosis not present

## 2019-11-20 DIAGNOSIS — M1990 Unspecified osteoarthritis, unspecified site: Secondary | ICD-10-CM | POA: Diagnosis not present

## 2019-11-20 DIAGNOSIS — R42 Dizziness and giddiness: Secondary | ICD-10-CM | POA: Diagnosis not present

## 2019-11-20 DIAGNOSIS — Z79899 Other long term (current) drug therapy: Secondary | ICD-10-CM | POA: Diagnosis not present

## 2019-11-20 DIAGNOSIS — Z8249 Family history of ischemic heart disease and other diseases of the circulatory system: Secondary | ICD-10-CM | POA: Diagnosis not present

## 2019-11-20 DIAGNOSIS — J449 Chronic obstructive pulmonary disease, unspecified: Secondary | ICD-10-CM

## 2019-11-20 DIAGNOSIS — Z950 Presence of cardiac pacemaker: Secondary | ICD-10-CM | POA: Diagnosis not present

## 2019-11-20 DIAGNOSIS — I4891 Unspecified atrial fibrillation: Secondary | ICD-10-CM | POA: Diagnosis not present

## 2019-11-20 DIAGNOSIS — Z7901 Long term (current) use of anticoagulants: Secondary | ICD-10-CM | POA: Insufficient documentation

## 2019-11-20 DIAGNOSIS — I480 Paroxysmal atrial fibrillation: Secondary | ICD-10-CM

## 2019-11-20 NOTE — Patient Instructions (Signed)
Continue weighing daily and call for an overnight weight gain of > 2 pounds or a weekly weight gain of >5 pounds. 

## 2019-11-20 NOTE — Progress Notes (Signed)
[facility] - PHARMACIST COUNSELING NOTE  ADHERENCE ASSESSMENT  Adherence: Patient has pillbox at home. Patient's son helps with medications. Son reports patient will sometimes forget to take medications if no one is there to remind her.   Guideline-Directed Medical Therapy/Evidence Based Medicine  ACE/ARB/ARNI: none Beta Blocker: none Aldosterone Antagonist: none Diuretic: Furosemide 80 mg QAM + 40 mg QPM    SUBJECTIVE   Past Medical History:  Diagnosis Date  . Atrial fibrillation ([place])   . CHF (congestive heart failure) ([place])   . Chronic systolic heart failure ([place])   . Complication of anesthesia    CONFUSION   . COPD (chronic obstructive pulmonary disease) ([place])   . Osteoarthritis, multiple sites   . Osteoporosis, post-menopausal   . Presence of permanent cardiac pacemaker       OBJECTIVE   Vital signs: HR 70, BP 113/52, weight (pounds) 115 ECHO: Date 09/02/19, EF 45-50%, notes Moderate mitral valve regurgitation, moderate tricuspid valve regurgitation, mild aortic valve regurgitation  BMP Latest Ref Rng & Units 11/08/2019 11/07/2019 11/05/2019  Glucose 70 - 99 mg/dL 126(H) 188(H) 132(H)  BUN 8 - 23 mg/dL 33(H) 22 17  Creatinine 0.44 - 1.00 mg/dL 1.15(H) 0.91 1.23(H)  Sodium 135 - 145 mmol/L 143 142 142  Potassium 3.5 - 5.1 mmol/L 3.8 3.4(L) 3.9  Chloride 98 - 111 mmol/L 100 99 98  CO2 22 - 32 mmol/L 34(H) 33(H) 31  Calcium 8.9 - 10.3 mg/dL 8.5(L) 8.1(L) 8.8(L)    ASSESSMENT  Patient is a 86 y/o F with history of atrial fibrillation on apixaban, CHF, COPD, osteoarthritis, osteoporosis, permanent pacemaker presenting for CHF management follow-up after recent admission on 11/05/19 for shortness of breath. History of recent laparoscopic cholecystectomy on 10/30/2019. Patient is russian speaking requiring interpreter. Son, who is involved in care, also present.  Denies signs/symptoms of bleeding on apixaban. No recent falls. Per  chart, apixaban dose recently reduced based on age & weight. Patient recently seen by [facility] on 11/09/2019 where furosemide was increased to 80 mg qAM + 40 mg qPM (titrated to target weight 118 lbs). In clinic today she is 115 lbs. Iron deficiency anemia with most recent Hgb 8.8 on oral iron supplementation.   PLAN -Continue current medication regimen -GDMT for CHF limited by soft blood pressure. Discussed with provider, defer initiation of RAAS agent / beta blocker   Time spent: 15 minutes  [place] Resident 11/20/2019 1:24 PM    Current Outpatient Medications:  .  acetaminophen (TYLENOL) 500 MG tablet, Take 2 tablets (1,000 mg total) by mouth every 6 (six) hours as needed for mild pain or moderate pain. Additional  500 mg if needed, Disp: 30 tablet, Rfl: 0 .  apixaban (ELIQUIS) 2.5 MG TABS tablet, Take 2.5 mg by mouth in the morning and at bedtime., Disp: , Rfl:  .  budesonide (PULMICORT) 0.5 MG/2ML nebulizer solution, Take 0.5 mg by nebulization in the morning and at bedtime. , Disp: , Rfl: 3 .  Calcium Carbonate-Vitamin D (CALCIUM-VITAMIN D) 500-200 MG-UNIT per tablet, Take 1 tablet by mouth daily., Disp: , Rfl:  .  Disposable Gloves (RA EXTENDED CUFF NITRILE GLOVE) MISC, Gloves as indicated for incontinence care, Disp: , Rfl:  .  ferrous sulfate 325 (65 FE) MG tablet, Take 325 mg by mouth at bedtime. , Disp: , Rfl:  .  formoterol (PERFOROMIST) 20 MCG/2ML nebulizer solution, Inhale 20 mcg into the lungs every 12 (twelve) hours., Disp: ,  Rfl:  .  furosemide (LASIX) 40 MG tablet, Take 1 tablet (40 mg total) by mouth daily. Take additional 40 mg in morning if needed (Patient taking differently: Take 80 mg by mouth daily. Patient is currently taking 80 mg QAM and 40 mg QPM), Disp: 30 tablet, Rfl: 1 .  Incontinence Supply Disposable (DEPEND EASY FIT UNDERGARMENTS) MISC, Please provide pull-ups, gloves, and liners. Use as needed, Disp: , Rfl:  .   Incontinence Supply Disposable ([place]AR[place]IE UNDERPAD) MISC, Use as needed for incontinence., Disp: , Rfl:  .  ipratropium-albuterol (DUONEB) 0.5-2.5 (3) MG/3ML SOLN, Inhale 3 mLs into the lungs every 8 (eight) hours as needed., Disp: , Rfl:  .  magnesium oxide (MAG-OX) 400 MG tablet, Take 400 mg by mouth daily., Disp: , Rfl:  .  potassium chloride (K-DUR) 10 MEQ tablet, Take 10 mEq by mouth daily., Disp: , Rfl: 3 .  tetrahydrozoline (EYE DROPS) 0.05 % ophthalmic solution, Place 1 drop into both eyes daily as needed (dry eye)., Disp: , Rfl:  .  vitamin B-12 (CYANOCOBALAMIN) 1000 MCG tablet, Take 1,000 mcg by mouth at bedtime. , Disp: , Rfl:    COUNSELING POINTS/CLINICAL PEARLS  Furosemide  Drug causes sun-sensitivity. Advise patient to use sunscreen and avoid tanning beds. Patient should avoid activities requiring coordination until drug effects are realized, as drug may cause dizziness, vertigo, or blurred vision. This drug may cause hyperglycemia, hyperuricemia, constipation, diarrhea, loss of appetite, nausea, vomiting, purpuric disorder, cramps, spasticity, asthenia, headache, paresthesia, or scaling eczema. Instruct patient to report unusual bleeding/bruising or signs/symptoms of hypotension, infection, pancreatitis, or ototoxicity (tinnitus, hearing impairment). Advise patient to report signs/symptoms of a severe skin reactions (flu-like symptoms, spreading red rash, or skin/mucous membrane blistering) or erythema multiforme. Instruct patient to eat high-potassium foods during drug therapy, as directed by healthcare professional.  Patient should not drink alcohol while taking this drug.   DRUGS TO AVOID IN HEART FAILURE  Drug or Class Mechanism  Analgesics . NSAIDs . COX-2 inhibitors . Glucocorticoids  Sodium and water retention, increased systemic vascular resistance, decreased response to diuretics   Diabetes Medications . Metformin . Thiazolidinediones o Rosiglitazone  (Avandia) o Pioglitazone (Actos) . DPP4 Inhibitors o Saxagliptin (Onglyza) o Sitagliptin (Januvia)   Lactic acidosis Possible calcium channel blockade   Unknown  Antiarrhythmics . Class I  o Flecainide o Disopyramide . Class III o Sotalol . Other o Dronedarone  Negative inotrope, proarrhythmic   Proarrhythmic, beta blockade  Negative inotrope  Antihypertensives . Alpha Blockers o Doxazosin . Calcium Channel Blockers o Diltiazem o Verapamil o Nifedipine . Central Alpha Adrenergics o Moxonidine . Peripheral Vasodilators o Minoxidil  Increases renin and aldosterone  Negative inotrope    Possible sympathetic withdrawal  Unknown  Anti-infective . Itraconazole . Amphotericin B  Negative inotrope Unknown  Hematologic . Anagrelide . Cilostazol   Possible inhibition of PD IV Inhibition of PD III causing arrhythmias  Neurologic/Psychiatric . Stimulants . Anti-Seizure Drugs o Carbamazepine o Pregabalin . Antidepressants o Tricyclics o Citalopram . Parkinsons o Bromocriptine o Pergolide o Pramipexole . Antipsychotics o Clozapine . Antimigraine o Ergotamine o Methysergide . Appetite suppressants . Bipolar o Lithium  Peripheral alpha and beta agonist activity  Negative inotrope and chronotrope Calcium channel blockade  Negative inotrope, proarrhythmic Dose-dependent QT prolongation  Excessive serotonin activity/valvular damage Excessive serotonin activity/valvular damage Unknown  IgE mediated hypersensitivy, calcium channel blockade  Excessive serotonin activity/valvular damage Excessive serotonin activity/valvular damage Valvular damage  Direct myofibrillar degeneration, adrenergic stimulation  Antimalarials . Chloroquine .  Hydroxychloroquine Intracellular inhibition of lysosomal enzymes  Urologic Agents . Alpha Blockers o Doxazosin o Prazosin o Tamsulosin o Terazosin  Increased renin and aldosterone  Adapted from Page RL, et  al. "Drugs That May Cause or Exacerbate Heart Failure: A Scientific Statement from the [employer]." Circulation 2016; 182:X93-Z16. DOI: 10.1161/CIR.0000000000000426   MEDICATION ADHERENCES TIPS AND STRATEGIES 1. Taking medication as prescribed improves patient outcomes in heart failure (reduces hospitalizations, improves symptoms, increases survival) 2. Side effects of medications can be managed by decreasing doses, switching agents, stopping drugs, or adding additional therapy. Please let someone in the [facility] know if you have having bothersome side effects so we can modify your regimen. Do not alter your medication regimen without talking to [place].  3. Medication reminders can help patients remember to take drugs on time. If you are missing or forgetting doses you can try linking behaviors, using pill boxes, or an electronic reminder like an alarm on your phone or an app. Some people can also get automated phone calls as medication reminders.

## 2019-11-26 ENCOUNTER — Encounter: Payer: Self-pay | Admitting: Surgery

## 2019-11-26 ENCOUNTER — Other Ambulatory Visit: Payer: Self-pay

## 2019-11-26 ENCOUNTER — Ambulatory Visit (INDEPENDENT_AMBULATORY_CARE_PROVIDER_SITE_OTHER): Payer: Self-pay | Admitting: Surgery

## 2019-11-26 VITALS — BP 129/62 | HR 71 | Temp 97.7°F

## 2019-11-26 DIAGNOSIS — M795 Residual foreign body in soft tissue: Secondary | ICD-10-CM

## 2019-11-26 DIAGNOSIS — Z09 Encounter for follow-up examination after completed treatment for conditions other than malignant neoplasm: Secondary | ICD-10-CM

## 2019-11-26 NOTE — Progress Notes (Signed)
11/26/2019  HPI: [facility] is a 86 y.o. female s/p robotic assisted cholecystectomy on 10/30/2019.  She presents today for follow-up.  She was last seen on 4/28 at which time she was doing very well and follow-up was set up as needed.  However the patient's son called because the patient feels pain around the umbilical area and feels that there is something poking on the skin.  The umbilical incision had to be enlarged because her gallbladder was very large with a very large stone inside of it and states bigger than her normal laparoscopic port incision.  Vital signs: BP 129/62   Pulse 71   Temp 97.7 F (36.5 C) (Temporal)   SpO2 97%    Physical Exam: Constitutional: No acute distress Abdomen: Soft, nondistended, with tenderness to palpation at the inferior portion of the umbilical incision.  The other remaining incisions are clean dry and intact with no pain.  The umbilical incision has what feels like PDS suture underneath the skin at the inferior portion.  The overlying skin is healthy with no ulceration or open wound.  Assessment/Plan: This is a 86 y.o. female s/p robotic assisted cholecystectomy.  -Discussed with the patient that what she is feeling and causing the pain is most likely the suture that was used to close umbilical incision.  I can feel the tails of the suture trying to poke underneath the skin.  They have not poke through the incision skin itself and there is no ulceration or evidence of infection.  At this point since no suture is truly protruding through the skin, I am unable to cut any suture readily.  However discussed with the patient and her son that if she wanted to, we can make a small incision at the skin level overlying the suture and cut the tails of the suture and then close the incision again.  However the patient at this point we want to wait and see how she does with this first.  She describes this more as an annoyance rather than true pain.  Discussed with her  that the suture with time will eventually dissolve and we can ensue in theory we do not have to do anything if she does not want to.  Patient will call us with any questions or concerns for any follow-up in the future.   Howie Ill, MD [facility]

## 2019-11-26 NOTE — Patient Instructions (Addendum)
Dr.Piscoya discussed with patient that with time the suture will dissolve within three to six months. Dr.Piscoya discussed and explained the in-office procedure to help with the discomfort of the suture ends.  Patient will contact our office in about an week or so to get schedule for an in-office procedure appointment. Patient may use cold ice pack to help with the discomfort and pain from area.  GENERAL POST-OPERATIVE PATIENT INSTRUCTIONS   FOLLOW-UP:  Please make an appointment with your physician in.  Call your physician immediately if you have any fevers greater than 102.5, drainage from you wound that is not clear or looks infected, persistent bleeding, increasing abdominal pain, problems urinating, or persistent nausea/vomiting.    WOUND CARE INSTRUCTIONS:  Keep a dry clean dressing on the wound if there is drainage. The initial bandage may be removed after 24 hours.  Once the wound has quit draining you may leave it open to air.  If clothing rubs against the wound or causes irritation and the wound is not draining you may cover it with a dry dressing during the daytime.  Try to keep the wound dry and avoid ointments on the wound unless directed to do so.  If the wound becomes bright red and painful or starts to drain infected material that is not clear, please contact your physician immediately.  If the wound is mildly pink and has a thick firm ridge underneath it, this is normal, and is referred to as a healing ridge.  This will resolve over the next 4-6 weeks.  DIET:  You may eat any foods that you can tolerate.  It is a good idea to eat a high fiber diet and take in plenty of fluids to prevent constipation.  If you do become constipated you may want to take a mild laxative or take ducolax tablets on a daily basis until your bowel habits are regular.  Constipation can be very uncomfortable, along with straining, after recent surgery.  ACTIVITY:  You are encouraged to cough and deep breath or  use your incentive spirometer if you were given one, every 15-30 minutes when awake.  This will help prevent respiratory complications and low grade fevers post-operatively if you had a general anesthetic.  You may want to hug a pillow when coughing and sneezing to add additional support to the surgical area, if you had abdominal or chest surgery, which will decrease pain during these times.  You are encouraged to walk and engage in light activity for the next two weeks.  You should not lift more than 20 pounds during this time frame as it could put you at increased risk for complications.  Twenty pounds is roughly equivalent to a plastic bag of groceries.    MEDICATIONS:  Try to take narcotic medications and anti-inflammatory medications, such as tylenol, ibuprofen, naprosyn, etc., with food.  This will minimize stomach upset from the medication.  Should you develop nausea and vomiting from the pain medication, or develop a rash, please discontinue the medication and contact your physician.  You should not drive, make important decisions, or operate machinery when taking narcotic pain medication.  QUESTIONS:  Please feel free to call your physician or the hospital operator if you have any questions, and they will be glad to assist you.

## 2019-11-30 ENCOUNTER — Ambulatory Visit: Admission: RE | Admit: 2019-11-30 | Payer: Medicare Other | Source: Ambulatory Visit

## 2019-12-19 ENCOUNTER — Ambulatory Visit: Payer: Medicare Other

## 2019-12-25 ENCOUNTER — Ambulatory Visit: Payer: Medicare Other | Attending: Geriatric Medicine

## 2019-12-25 ENCOUNTER — Other Ambulatory Visit: Payer: Self-pay

## 2019-12-25 DIAGNOSIS — R262 Difficulty in walking, not elsewhere classified: Secondary | ICD-10-CM | POA: Diagnosis present

## 2019-12-25 DIAGNOSIS — M25532 Pain in left wrist: Secondary | ICD-10-CM | POA: Diagnosis present

## 2019-12-25 DIAGNOSIS — M25632 Stiffness of left wrist, not elsewhere classified: Secondary | ICD-10-CM | POA: Diagnosis present

## 2019-12-25 DIAGNOSIS — M25551 Pain in right hip: Secondary | ICD-10-CM | POA: Diagnosis present

## 2019-12-25 DIAGNOSIS — M6281 Muscle weakness (generalized): Secondary | ICD-10-CM | POA: Insufficient documentation

## 2019-12-25 NOTE — Therapy (Signed)
[facility] PHYSICAL AND SPORTS MEDICINE [address] [place], [place], [address] Phone: [phone]   Fax:  [phone]  Physical Therapy Evaluation  Patient Details  Name: Diana HotterGalina Powell MRN: [id number] Date of Birth: [birth year] Referring Provider (PT): Clarisa Kindredina Hackney, FNP   Encounter Date: 12/25/2019  PT End of Session - 12/25/19 1748    Visit Number  1    Number of Visits  24    Date for PT Re-Evaluation  03/18/20    Authorization Type  MCR    Authorization Time Period  12/25/19-03/18/20    Authorization - Visit Number  1    Authorization - Number of Visits  10    PT Start Time  1645    PT Stop Time  1745    PT Time Calculation (min)  60 min    Activity Tolerance  Patient limited by fatigue;Patient tolerated treatment well;Treatment limited secondary to medical complications (Comment)   has dizziness in session, need to sit   Behavior During Therapy  [facility] for tasks assessed/performed       Past Medical History:  Diagnosis Date  . Atrial fibrillation (HCC)   . CHF (congestive heart failure) (HCC)   . Chronic systolic heart failure (HCC)   . Complication of anesthesia    CONFUSION   . COPD (chronic obstructive pulmonary disease) (HCC)   . Osteoarthritis, multiple sites   . Osteoporosis, post-menopausal   . Presence of permanent cardiac pacemaker     Past Surgical History:  Procedure Laterality Date  . CARDIOVERSION  ~2009  . HIP FRACTURE SURGERY Bilateral [phone]   each hip fractured at separate times  . INSERT / REPLACE / REMOVE PACEMAKER    . IR EXCHANGE BILIARY DRAIN  10/05/2019  . TOTAL HIP ARTHROPLASTY Right 04/20/2018   Procedure: TOTAL HIP CONVERSION;  Surgeon: Kennedy BuckerMenz, Michael, MD;  Location: [facility] ORS;  Service: Orthopedics;  Laterality: Right;    There were no vitals filed for this visit.   Subjective Assessment - 12/25/19 1649    Subjective  Pt was acutely ill with CHF exacerbation back in April, DC to home with HHPT  recommendation, but ulitmately family/patient declined services due to prior bas experiences. Pt was sent home on O2 since last hospitalization, instructions to use at night 1.5-2L/min, or as needed during day. Per son, pt has not bene especially active since return to home, has not yet resumed any prior HEP, simply allowing time for rest, recovery.    Patient is accompained by:  Family member    Pertinent History  CVA with perserved EF, self-reported 'Lung condition', R THA, DVT, wrist fx    Limitations  Lifting;Standing;Walking    How long can you sit comfortably?  unlimited    How long can you stand comfortably?  15-20 minutes    How long can you walk comfortably?  unknown, has been mostly performing household mobility since DC    Patient Stated Goals  improved functional tolerance to AMB regarding breathing limitations    Pain Score  --   pt does not rate; wincing with movement of head, points to posterior neck        [facility] - 12/25/19 0001      Assessment   Medical Diagnosis  Acute deconditioning p CHF exacerbation   Referring Provider (PT)  Clarisa Kindredina Hackney, FNP    Onset Date/Surgical Date  10/26/19    Hand Dominance  Right    Next MD Visit  --  September 2021   Prior Therapy  Yes, extensive PT at this clinic      Precautions   Precautions  Fall      Balance Screen   Has the patient fallen in the past 6 months  No   a few close calls per Son   Has the patient had a decrease in activity level because of a fear of falling?   No    Is the patient reluctant to leave their home because of a fear of falling?   No      Home Environment   Living Environment  Private residence    Living Arrangements  Children   Son   Available Help at Discharge  Family    Type of [place] to enter   1-4" partial steps    Home Layout  Two level;Able to live on main level with bedroom/bathroom      Prior Function   Level of Independence  Independent with  household mobility with device;Needs assistance with ADLs    Toileting  Moderate    Dressing  Moderate    Grooming  Moderate    Vocation  Retired    Biomedical scientist  n/a    Comments  largely for energy conservation      Transfers   Five time sit to stand comments   20sec, standard chair    UE assist ad lib, BUE pushing up from seat of chair      Ambulation/Gait   Ambulation Distance (Feet)  300 Feet   2MWT   Assistive device  Rolling walker    Gait Pattern  Step-through pattern    Gait velocity  0.50m/s    Gait Comments  2MWT:22ft   SOB at end of test, SpO2 WNL 94%, HR 80 BPM.      Balance   Balance Assessed  Yes      High Level Balance   High Level Balance Activities SLS: Rt: 3sec; Left ~1Sec   modified tandem  stance >30sc B; (step-through, strd width)    High Level Balance Comments  eyes closed, normal stance: 15+sec (author termintated)   Marching in place: 4 total steps, self limited by low confid     Standardized Balance Assessment   Standardized Balance Assessment  --   unable to perform due to DOE limitations     Timed Up and Go Test   Normal TUG (seconds)  --   defer      Orthostatic Vitals:  Seated: 146/69mmHg, 86bpm Standing: 156/50mmHg, 97bpm Standing at 2 minutes: 162/80mmHg, 92bpm   Post 2MWT vitals:  -3/4 dyspnea scale  -94% SpO2, HR: 80bpm   Objective measurements completed on examination: See above findings.    PT Education - 12/25/19 1747    Education Details  Educated Son on alterior causes of DOE that are unrelatd to hypoxia    Person(s) Educated  Child(ren)    Methods  Explanation    Comprehension  Need further instruction       PT Short Term Goals - 12/25/19 1957      PT SHORT TERM GOAL #1   Title  After 4 weeks pt will tolerate 4 minutes sustained AMB c gait speed > 0.25m/s to improve capacity to perform safe household distance AMB.    Baseline  At evaluation 12/25/19: 3 minute tolerance at 0.51m/s c RW    Time  4     Period  Weeks  Status  New    Target Date  01/22/20      PT SHORT TERM GOAL #2   Title  After 4 weeks pt to demonstrate improved 5xSTS from 19" surface, hands free in <16sec.    Baseline  At eval 12/25/19: 20sec from chair with hand push-off.    Time  4    Period  Weeks    Status  New    Target Date  01/22/20        PT Long Term Goals - 12/25/19 2000      PT LONG TERM GOAL #1   Title  After 8 weeks pt to demonstrate ability to perform 5 continuous minutes AMB @ >0.78m/s c RW.    Baseline  328ft; 05/22/2019: 667ft; 06/19/2019: 557ft; 08/01/2019: at eval: 3 minutes only or 351ft.    Time  8    Period  Weeks    Status  New    Target Date  02/19/20      PT LONG TERM GOAL #2   Title  After 8 weeks pt will demonstrate improve SLS >10sec BILAT at supervision level, no UE for recovery.    Baseline  At eval 12/25/19: RLE ~3sec; LLE ~1sec;    Time  8    Period  Weeks    Status  New    Target Date  02/19/20      PT LONG TERM GOAL #3   Title  At 12 weeks pt will tolerate 15 minutes moderate aerobic actiivty on recumbent stepper with supplemental O2 PRN to prepar for community based exercise compliance at DC.    Baseline  Not performing    Time  12    Period  Weeks    Status  New    Target Date  03/18/20      PT LONG TERM GOAL #4   Title  In 12 weeks patient will improve 5 x STS to under 12sec to decrease fall risk and improve LE functional strength.    Baseline  5xSTS: 27 sec; 05/22/2019: 20 sec; 06/19/2019: 20sec; 08/01/2019: Eval 12/25/19: 20sec    Time  12    Period  Weeks    Status  New    Target Date  03/18/20             Plan - 12/25/19 1750    Clinical Impression Statement Pt presenting to OPPT for evlauation of acute deconditioning s/p hospitalization 2 months ago, pt familiar to this clinic from PT services immediately prior to admission. Per pt, biggest functional lmitation in more recent weeks has been related to subacute limitiations in activity tolerance to basic  self care and household distance AMB. Pt has had fluctuating levels of DOE with actiivty, with exacerbations low likelihood for DOE exacerbation at present, and pt has no gross hypoxia with activity, hence COPD/CHF exacerbations not well suported. Pt may have some contributory DOE related to 3-valve regurgitation, however this was an active diagnosis in Feb, at which point pt was Home O2 use is new to this patient and additional education is warranted to best optimize use at home. In examination pt demonstrates AMB tolerance for around 341ft or 2 minutes 45 seconds, whereas pt was tolerating 400-549ft AMB distances. 5xSTS testing demonstrate slight improvement in time since initially tested 8 months ago, however, performance motor patterns are suggestive of impaired postural control in standing, low level falls anxiety, weakness of BLE in combined hip/knee extension, and limited UE capacity for supportive assistance. Pt has  long history of falls, this date balance screening indicated fair performance of eyes closed balance, significant limitations in right strategies in semitandem stance, and inability to maintain SLS >3sec (Right better than Left). Pt has some dizziness and increaed falls anxiety durign testing which results in her asking to sit briefly- subsequent assessment of orthostatic BP revealing of normostatic BP trend from sitting to standing at 0 and 2 minutes. Pt has no gross hypoxia in session, but does have limiting DOE which appears to recover within 2-3 minutes resting seated. Based on objective tests and measures in this visit, correlated with recent visits to HF clinic and PCP, likely current strongest contributor to DOE/SOB is persistent acute anemia in 8s down from 11s . Pts vitals are stable with activity, normal BP, HR, and SpO2 response which make CHF, COPD, AF/RVR poorly supported culprits for continued DOE complaint. Pt likely has some contribution to DOE from 3-valve regurgitation,  however this in not a new diagnosis, and pt was tolerating AMB activity to a greater degree back in April prior to acute anemia values. Pt will benefit from skilled PT intervention to improve AMB actiivty tolerance as well as ADL self-care activity tolerance as it is imposed by cardiorespiratory DOE to improve functional independence. Pt will also benefit from continued balance training to reduce falls risk and therapeutic exercise to address strength deficits to improve capacity for success of postural righting strategies during self correction of LOB.   Personal Factors and Comorbidities  Comorbidity 2;Past/Current Experience    Comorbidities  CHF, DVT, THA, CVA, "lung disorder"    Examination-Activity Limitations  Carry;Lift;Locomotion Level;Stand;Stairs;Bend;Bathing;Dressing    Examination-Participation Restrictions  Cleaning;Community Activity;Meal Prep    Stability/Clinical Decision Making  Evolving/Moderate complexity    Clinical Decision Making  Moderate    Clinical Presentation due to:  new CHF diagnosis    Rehab Potential  Fair    Clinical Impairments Affecting Rehab Potential  (+) highly motivated, family support (-) age, CHF, lack of opportunies for physical activity    PT Frequency  2x / week    PT Duration  12 weeks    PT Treatment/Interventions  Electrical Stimulation;Iontophoresis 4mg /ml Dexamethasone;Aquatic Therapy;Cryotherapy;Moist Heat;Therapeutic activities;Patient/family education;Manual techniques;Balance training;Stair training;Gait training;Neuromuscular re-education;Therapeutic exercise;Passive range of motion    PT Next Visit Plan  as goal is to progress recovery from acute anemia in the setting of CKD3, will commence moderate level cardiovascular interventions initially.    PT Home Exercise Plan  not yet established    Consulted and Agree with Plan of Care  Patient;Family member/caregiver    Family Member Consulted  Son       Patient will benefit from skilled  therapeutic intervention in order to improve the following deficits and impairments:  Abnormal gait, Pain, Decreased coordination, Decreased mobility, Increased muscle spasms, Decreased endurance, Decreased range of motion, Decreased strength, Decreased balance, Difficulty walking, Decreased activity tolerance, Cardiopulmonary status limiting activity, Dizziness  Visit Diagnosis: Difficulty in walking, not elsewhere classified  Muscle weakness (generalized)     Problem List Patient Active Problem List   Diagnosis Date Noted  . Malnutrition of moderate degree 11/08/2019  . COPD with acute exacerbation (HCC) 11/06/2019  . S/P laparoscopic cholecystectomy 10/30/19 11/06/2019  . CHF (congestive heart failure) (HCC) 11/06/2019  . CKD (chronic kidney disease) stage 3, GFR 30-59 ml/min 11/06/2019  . Acute on chronic systolic CHF (congestive heart failure) (HCC) 11/06/2019  . Diastolic CHF (HCC) 11/06/2019  . Acute respiratory failure with hypoxia (HCC)   . Acute kidney  injury superimposed on CKD (HCC)   . Acute cholecystitis 08/31/2019  . RUQ pain   . Chronic obstructive pulmonary disease (HCC)   . Acute DVT (deep venous thrombosis) (HCC) 04/29/2018  . 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 AF (paroxysmal atrial fibrillation) (HCC)   . Chronic systolic CHF (congestive heart failure) (HCC)   . Osteoporosis, post-menopausal   . Osteoarthritis, multiple sites    8:14 PM, 12/25/19 Rosamaria Lints, PT, DPT Physical Therapist - [place] [phone] (Office)  Harris Penton C 12/25/2019, 8:07 PM  [facility] PHYSICAL AND SPORTS MEDICINE [address], [place], [address] Phone: [phone]   Fax:  [phone]  Name: Diana Powell MRN: [id number] Date of Birth: 12-22-1926

## 2020-01-01 ENCOUNTER — Ambulatory Visit: Payer: Medicare Other

## 2020-01-01 ENCOUNTER — Other Ambulatory Visit: Payer: Self-pay

## 2020-01-01 DIAGNOSIS — M25551 Pain in right hip: Secondary | ICD-10-CM

## 2020-01-01 DIAGNOSIS — M25532 Pain in left wrist: Secondary | ICD-10-CM

## 2020-01-01 DIAGNOSIS — R262 Difficulty in walking, not elsewhere classified: Secondary | ICD-10-CM

## 2020-01-01 DIAGNOSIS — M25632 Stiffness of left wrist, not elsewhere classified: Secondary | ICD-10-CM

## 2020-01-01 DIAGNOSIS — M6281 Muscle weakness (generalized): Secondary | ICD-10-CM

## 2020-01-01 NOTE — Therapy (Signed)
[place] PHYSICAL AND SPORTS MEDICINE [address], [place], [address] Phone: [phone]   Fax:  [phone]  Physical Therapy Treatment  Patient Details  Name: Diana Powell MRN: [id number] Date of Birth: 29-Nov-1926 Referring Provider (PT): Darylene Price, FNP   Encounter Date: 01/01/2020   PT End of Session - 01/01/20 1446    Visit Number 2    Number of Visits 24    Date for PT Re-Evaluation 03/18/20    Authorization Type MCR    Authorization Time Period 12/25/19-03/18/20    Authorization - Visit Number 2    Authorization - Number of Visits 16    PT Start Time 1435    PT Stop Time 1515    PT Time Calculation (min) 40 min    Equipment Utilized During Treatment Gait belt    Activity Tolerance Patient limited by fatigue;Patient tolerated treatment well;Treatment limited secondary to medical complications (Comment)    Behavior During Therapy [facility] for tasks assessed/performed           Past Medical History:  Diagnosis Date  . Atrial fibrillation ([place])   . CHF (congestive heart failure) ([place])   . Chronic systolic heart failure ([place])   . Complication of anesthesia    CONFUSION   . COPD (chronic obstructive pulmonary disease) ([place])   . Osteoarthritis, multiple sites   . Osteoporosis, post-menopausal   . Presence of permanent cardiac pacemaker     Past Surgical History:  Procedure Laterality Date  . CARDIOVERSION  ~2009  . HIP FRACTURE SURGERY Bilateral [phone]   each hip fractured at separate times  . INSERT / REPLACE / REMOVE PACEMAKER    . IR EXCHANGE BILIARY DRAIN  10/05/2019  . TOTAL HIP ARTHROPLASTY Right 04/20/2018   Procedure: TOTAL HIP CONVERSION;  Surgeon: Hessie Knows, MD;  Location: [facility] ORS;  Service: Orthopedics;  Laterality: Right;    There were no vitals filed for this visit.   Subjective Assessment - 01/01/20 1442    Subjective Pt doing well this date, no major updates today, but has been getting outside  daily.    Pertinent History ECHO 09/02/19: Moderate MVR, moderate TVR, mild AVR, EF: 45-50%. Recent Adjustments to apixaban and furosemide doses based on age/weight. Most recent Hb: 8.8 on iron supplementation, per PCP note down from baseline of 11.6 (09/14/19). Pt recently had a sleep study completed showing nocturnal hypoxia now on O2 over-night.    Limitations Lifting;Standing;Walking    How long can you sit comfortably? unlimited    How long can you stand comfortably? 15-20 minutes    How long can you walk comfortably? unknown, has been mostly performing household mobility since DC    Patient Stated Goals improved functional tolerance to AMB regarding breathing limitations    Currently in Pain? No/denies   'typical aches and pain'          INTERVENTION THIS DATE: -Cardiovascular conditioning for ABLA; Nustep x 10 minutes, Seat 5, Arms 7, SPM cued >50, vitals taken q68min.  *SpO2 remains >93 %, HR <73bpm through -Seated LAQ 1x12@0lb , 1x12@2lb  -Stand straight knee hip extension 1x10 bilat (BUE support) 2lb AW -Standing 6" inch step 1x10 bilat      PT Short Term Goals - 12/25/19 1957      PT SHORT TERM GOAL #1   Title After 4 weeks pt will tolerate 4 minutes sustained AMB c gait speed > 0.44m/s to improve capacity to perform safe household distance AMB.    Baseline  At evaluation 12/25/19: 3 minute tolerance at 0.23m/s c RW    Time 4    Period Weeks    Status New    Target Date 01/22/20      PT SHORT TERM GOAL #2   Title After 4 weeks pt to demonstrate improved 5xSTS from 19" surface, hands free in <16sec.    Baseline At eval 12/25/19: 20sec from chair with hand push-off.    Time 4    Period Weeks    Status New    Target Date 01/22/20             PT Long Term Goals - 12/25/19 2000      PT LONG TERM GOAL #1   Title After 8 weeks pt to demonstrate ability to perform 5 continuous minutes AMB @ >0.68m/s c RW.    Baseline 375ft; 05/22/2019: 64ft; 06/19/2019: 528ft; 08/01/2019: at  eval: 3 minutes only or 362ft.    Time 8    Period Weeks    Status New    Target Date 02/19/20      PT LONG TERM GOAL #2   Title After 8 weeks pt will demonstrate improve SLS >10sec BILAT at supervision level, no UE for recovery.    Baseline At eval 12/25/19: RLE ~3sec; LLE ~1sec;    Time 8    Period Weeks    Status New    Target Date 02/19/20      PT LONG TERM GOAL #3   Title At 12 weeks pt will tolerate 15 minutes moderate aerobic actiivty on recumbent stepper with supplemental O2 PRN to prepar for community based exercise compliance at DC.    Baseline Not performing    Time 12    Period Weeks    Status New    Target Date 03/18/20      PT LONG TERM GOAL #4   Title In 12 weeks patient will improve 5 x STS to under 12sec to decrease fall risk and improve LE functional strength.    Baseline 5xSTS: 27 sec; 05/22/2019: 20 sec; 06/19/2019: 20sec; 08/01/2019: Eval 12/25/19: 20sec    Time 12    Period Weeks    Status New    Target Date 03/18/20                 Plan - 01/01/20 1450    Clinical Impression Statement Pt back to 2nd visit, tolerated evaluation visit well in general, no aggravation of symptoms or pain. Commenced with trial of cardiovascular training on Niustep to stimulate response to ABLA post DC from hospital. Proceeded with strength training of hips, 4-way hip. Pt does well in general, no exacerbation of SOB. Son educated on goal of targeting ABLA as primary assumptive SOB etiology.    Personal Factors and Comorbidities Comorbidity 2;Past/Current Experience    Comorbidities CHF, DVT, THA, CVA, "lung disorder"    Examination-Activity Limitations Carry;Lift;Locomotion Level;Stand;Stairs;Bend;Bathing;Dressing    Examination-Participation Restrictions Cleaning;Community Activity;Meal Prep    Stability/Clinical Decision Making Evolving/Moderate complexity    Clinical Decision Making Moderate    Clinical Presentation due to: new CHF diagnosis    Rehab Potential Fair     Clinical Impairments Affecting Rehab Potential (+) highly motivated, family support (-) age, CHF, lack of opportunies for physical activity    PT Frequency 2x / week    PT Duration 12 weeks    PT Treatment/Interventions Electrical Stimulation;Iontophoresis 4mg /ml Dexamethasone;Aquatic Therapy;Cryotherapy;Moist Heat;Therapeutic activities;Patient/family education;Manual techniques;Balance training;Stair training;Gait training;Neuromuscular re-education;Therapeutic exercise;Passive range of motion    PT  Next Visit Plan establish HEP    PT Home Exercise Plan not yet established    Consulted and Agree with Plan of Care Patient;Family member/caregiver    Family Member Consulted Son           Patient will benefit from skilled therapeutic intervention in order to improve the following deficits and impairments:  Abnormal gait, Pain, Decreased coordination, Decreased mobility, Increased muscle spasms, Decreased endurance, Decreased range of motion, Decreased strength, Decreased balance, Difficulty walking, Decreased activity tolerance, Cardiopulmonary status limiting activity, Dizziness  Visit Diagnosis: Difficulty in walking, not elsewhere classified  Muscle weakness (generalized)  Pain in right hip  Pain in left wrist  Stiffness of left wrist, not elsewhere classified     Problem List Patient Active Problem List   Diagnosis Date Noted  . Malnutrition of moderate degree 11/08/2019  . COPD with acute exacerbation (HCC) 11/06/2019  . S/P laparoscopic cholecystectomy 10/30/19 11/06/2019  . CHF (congestive heart failure) (HCC) 11/06/2019  . CKD (chronic kidney disease) stage 3, GFR 30-59 ml/min 11/06/2019  . Acute on chronic systolic CHF (congestive heart failure) (HCC) 11/06/2019  . Diastolic CHF (HCC) 11/06/2019  . Acute respiratory failure with hypoxia (HCC)   . Acute kidney injury superimposed on CKD (HCC)   . Acute cholecystitis 08/31/2019  . RUQ pain   . Chronic obstructive  pulmonary disease (HCC)   . Acute DVT (deep venous thrombosis) (HCC) 04/29/2018  . 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 AF (paroxysmal atrial fibrillation) (HCC)   . Chronic systolic CHF (congestive heart failure) (HCC)   . Osteoporosis, post-menopausal   . Osteoarthritis, multiple sites    3:16 PM, 01/01/20 Rosamaria Lints, PT, DPT Physical Therapist - [facility]  [phone] (ASCOM)    [place] C 01/01/2020, 2:55 PM  [facility] PHYSICAL AND SPORTS MEDICINE [address], [place], [address] Phone: [phone]   Fax:  [phone]  Name: Soniya Ashraf MRN: [id number] Date of Birth: Jun 20, 1927

## 2020-01-08 ENCOUNTER — Other Ambulatory Visit: Payer: Self-pay

## 2020-01-08 ENCOUNTER — Ambulatory Visit: Payer: Medicare Other

## 2020-01-08 DIAGNOSIS — M6281 Muscle weakness (generalized): Secondary | ICD-10-CM

## 2020-01-08 DIAGNOSIS — M25551 Pain in right hip: Secondary | ICD-10-CM

## 2020-01-08 DIAGNOSIS — R262 Difficulty in walking, not elsewhere classified: Secondary | ICD-10-CM | POA: Diagnosis not present

## 2020-01-08 NOTE — Therapy (Signed)
[place] [facility] [address] [place], [address] Phone: [phone]   Fax:  [phone]  Physical Therapy Treatment  Patient Details  Name: Diana Powell MRN: [id number] Date of Birth: 04-01-1927 Referring Provider (PT): Darylene Price, FNP   Encounter Date: 01/08/2020   PT End of Session - 01/08/20 1555    Visit Number 3    Number of Visits 24    Date for PT Re-Evaluation 03/18/20    Authorization Type MCR    Authorization Time Period 12/25/19-03/18/20    Authorization - Visit Number 3    Authorization - Number of Visits 16    PT Start Time 7793    PT Stop Time 1515    PT Time Calculation (min) 40 min    Equipment Utilized During Treatment Gait belt    Activity Tolerance Patient limited by fatigue;Patient tolerated treatment well;Treatment limited secondary to medical complications (Comment)    Behavior During Therapy [facility] for tasks assessed/performed           Past Medical History:  Diagnosis Date  . Atrial fibrillation ([place])   . CHF (congestive heart failure) ([place])   . Chronic systolic heart failure ([place])   . Complication of anesthesia    CONFUSION   . COPD (chronic obstructive pulmonary disease) ([place])   . Osteoarthritis, multiple sites   . Osteoporosis, post-menopausal   . Presence of permanent cardiac pacemaker     Past Surgical History:  Procedure Laterality Date  . CARDIOVERSION  ~2009  . HIP FRACTURE SURGERY Bilateral [phone]   each hip fractured at separate times  . INSERT / REPLACE / REMOVE PACEMAKER    . IR EXCHANGE BILIARY DRAIN  10/05/2019  . TOTAL HIP ARTHROPLASTY Right 04/20/2018   Procedure: TOTAL HIP CONVERSION;  Surgeon: Hessie Knows, MD;  Location: [facility] ORS;  Service: Orthopedics;  Laterality: Right;    There were no vitals filed for this visit.   Subjective Assessment - 01/08/20 1553    Subjective Patient's son reports increased fatigue and poor sleep over the past week.     Pertinent History ECHO 09/02/19: Moderate MVR, moderate TVR, mild AVR, EF: 45-50%. Recent Adjustments to apixaban and furosemide doses based on age/weight. Most recent Hb: 8.8 on iron supplementation, per PCP note down from baseline of 11.6 (09/14/19). Pt recently had a sleep study completed showing nocturnal hypoxia now on O2 over-night.    Limitations Lifting;Standing;Walking    How long can you sit comfortably? unlimited    How long can you stand comfortably? 15-20 minutes    How long can you walk comfortably? unknown, has been mostly performing household mobility since DC    Patient Stated Goals improved functional tolerance to AMB regarding breathing limitations    Currently in Pain? No/denies               TREATMENT  INTERVENTION THIS DATE: -Cardiovascular conditioning for ABLA; Nustep  2 x 4 minutes, Seat 6, Arms 6, SPM cued >50, vitals taken q60min.  *SpO2 remains >91 %, HR <81bpm through -Standing hip abduction with UE support - 2 x 10  -Standing hip extension with UE support - x 10 -Ambulation with use of FWW - x331ft, x 24ft with focus on performing average 43 sec average lap  Performed to improve functional capacity       PT Education - 01/08/20 1554    Education Details Form/tecnique with exercises; educated on reasons for DOE    [employer]) Educated Patient  Methods Explanation;Demonstration    Comprehension Verbalized understanding;Returned demonstration            PT Short Term Goals - 12/25/19 1957      PT SHORT TERM GOAL #1   Title After 4 weeks pt will tolerate 4 minutes sustained AMB c gait speed > 0.57m/s to improve capacity to perform safe household distance AMB.    Baseline At evaluation 12/25/19: 3 minute tolerance at 0.9m/s c RW    Time 4    Period Weeks    Status New    Target Date 01/22/20      PT SHORT TERM GOAL #2   Title After 4 weeks pt to demonstrate improved 5xSTS from 19" surface, hands free in <16sec.    Baseline At eval 12/25/19:  20sec from chair with hand push-off.    Time 4    Period Weeks    Status New    Target Date 01/22/20             PT Long Term Goals - 12/25/19 2000      PT LONG TERM GOAL #1   Title After 8 weeks pt to demonstrate ability to perform 5 continuous minutes AMB @ >0.38m/s c RW.    Baseline 327ft; 05/22/2019: 639ft; 06/19/2019: 561ft; 08/01/2019: at eval: 3 minutes only or 360ft.    Time 8    Period Weeks    Status New    Target Date 02/19/20      PT LONG TERM GOAL #2   Title After 8 weeks pt will demonstrate improve SLS >10sec BILAT at supervision level, no UE for recovery.    Baseline At eval 12/25/19: RLE ~3sec; LLE ~1sec;    Time 8    Period Weeks    Status New    Target Date 02/19/20      PT LONG TERM GOAL #3   Title At 12 weeks pt will tolerate 15 minutes moderate aerobic actiivty on recumbent stepper with supplemental O2 PRN to prepar for community based exercise compliance at DC.    Baseline Not performing    Time 12    Period Weeks    Status New    Target Date 03/18/20      PT LONG TERM GOAL #4   Title In 12 weeks patient will improve 5 x STS to under 12sec to decrease fall risk and improve LE functional strength.    Baseline 5xSTS: 27 sec; 05/22/2019: 20 sec; 06/19/2019: 20sec; 08/01/2019: Eval 12/25/19: 20sec    Time 12    Period Weeks    Status New    Target Date 03/18/20                 Plan - 01/08/20 1555    Clinical Impression Statement Patient responds well to therapy, however fatigues quickly secondary to DOE requiring frequent sitting rest breaks to regain strength. Will benefit from from further skilled therapy focused on improving these limitations to return to walking function and functional capacity.    Personal Factors and Comorbidities Comorbidity 2;Past/Current Experience    Comorbidities CHF, DVT, THA, CVA, "lung disorder"    Examination-Activity Limitations Carry;Lift;Locomotion Level;Stand;Stairs;Bend;Bathing;Dressing     Examination-Participation Restrictions Cleaning;Community Activity;Meal Prep    Stability/Clinical Decision Making Evolving/Moderate complexity    Rehab Potential Fair    Clinical Impairments Affecting Rehab Potential (+) highly motivated, family support (-) age, CHF, lack of opportunies for physical activity    PT Frequency 2x / week    PT Duration 12  weeks    PT Treatment/Interventions Electrical Stimulation;Iontophoresis 4mg /ml Dexamethasone;Aquatic Therapy;Cryotherapy;Moist Heat;Therapeutic activities;Patient/family education;Manual techniques;Balance training;Stair training;Gait training;Neuromuscular re-education;Therapeutic exercise;Passive range of motion    PT Next Visit Plan establish HEP    PT Home Exercise Plan not yet established    Consulted and Agree with Plan of Care Patient;Family member/caregiver    Family Member Consulted Son           Patient will benefit from skilled therapeutic intervention in order to improve the following deficits and impairments:  Abnormal gait, Pain, Decreased coordination, Decreased mobility, Increased muscle spasms, Decreased endurance, Decreased range of motion, Decreased strength, Decreased balance, Difficulty walking, Decreased activity tolerance, Cardiopulmonary status limiting activity, Dizziness  Visit Diagnosis: Difficulty in walking, not elsewhere classified  Muscle weakness (generalized)  Pain in right hip     Problem List Patient Active Problem List   Diagnosis Date Noted  . Malnutrition of moderate degree 11/08/2019  . COPD with acute exacerbation (HCC) 11/06/2019  . S/P laparoscopic cholecystectomy 10/30/19 11/06/2019  . CHF (congestive heart failure) (HCC) 11/06/2019  . CKD (chronic kidney disease) stage 3, GFR 30-59 ml/min 11/06/2019  . Acute on chronic systolic CHF (congestive heart failure) (HCC) 11/06/2019  . Diastolic CHF (HCC) 11/06/2019  . Acute respiratory failure with hypoxia (HCC)   . Acute kidney injury  superimposed on CKD (HCC)   . Acute cholecystitis 08/31/2019  . RUQ pain   . Chronic obstructive pulmonary disease (HCC)   . Acute DVT (deep venous thrombosis) (HCC) 04/29/2018  . 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 AF (paroxysmal atrial fibrillation) (HCC)   . Chronic systolic CHF (congestive heart failure) (HCC)   . Osteoporosis, post-menopausal   . Osteoarthritis, multiple sites     05/06/2013, PT DPT 01/08/2020, 4:01 PM  [facility] [address], [place] Phone: [phone]   Fax:  [phone]  Name: Embry Manrique MRN: Genia Hotter Date of Birth: [birth year]

## 2020-01-15 ENCOUNTER — Ambulatory Visit: Payer: Medicare Other

## 2020-01-15 ENCOUNTER — Other Ambulatory Visit: Payer: Self-pay

## 2020-01-15 DIAGNOSIS — R262 Difficulty in walking, not elsewhere classified: Secondary | ICD-10-CM | POA: Diagnosis not present

## 2020-01-15 DIAGNOSIS — M25551 Pain in right hip: Secondary | ICD-10-CM

## 2020-01-15 DIAGNOSIS — M6281 Muscle weakness (generalized): Secondary | ICD-10-CM

## 2020-01-15 NOTE — Therapy (Signed)
[place] [facility] [address] [place], [address] Phone: [phone]   Fax:  [phone]  Physical Therapy Treatment  Patient Details  Name: Diana Powell MRN: [id number] Date of Birth: [birth year] Referring Provider (PT): Clarisa Kindred, FNP   Encounter Date: 01/15/2020   PT End of Session - 01/15/20 1447    Visit Number 4    Number of Visits 24    Date for PT Re-Evaluation 03/18/20    Authorization Type MCR    Authorization Time Period 12/25/19-03/18/20    Authorization - Visit Number 4    Authorization - Number of Visits 16    PT Start Time 1430    PT Stop Time 1515    PT Time Calculation (min) 45 min    Equipment Utilized During Treatment Gait belt    Activity Tolerance Patient limited by fatigue;Patient tolerated treatment well;Treatment limited secondary to medical complications (Comment)    Behavior During Therapy [facility] for tasks assessed/performed           Past Medical History:  Diagnosis Date  . Atrial fibrillation (HCC)   . CHF (congestive heart failure) (HCC)   . Chronic systolic heart failure (HCC)   . Complication of anesthesia    CONFUSION   . COPD (chronic obstructive pulmonary disease) (HCC)   . Osteoarthritis, multiple sites   . Osteoporosis, post-menopausal   . Presence of permanent cardiac pacemaker     Past Surgical History:  Procedure Laterality Date  . CARDIOVERSION  ~2009  . HIP FRACTURE SURGERY Bilateral [phone]   each hip fractured at separate times  . INSERT / REPLACE / REMOVE PACEMAKER    . IR EXCHANGE BILIARY DRAIN  10/05/2019  . TOTAL HIP ARTHROPLASTY Right 04/20/2018   Procedure: TOTAL HIP CONVERSION;  Surgeon: Kennedy Bucker, MD;  Location: [facility] ORS;  Service: Orthopedics;  Laterality: Right;    There were no vitals filed for this visit.   Subjective Assessment - 01/15/20 1435    Subjective Patient's son reports increased fatigue today as she she has difficulty with  walking this morning and did not sleep well last night.    Pertinent History ECHO 09/02/19: Moderate MVR, moderate TVR, mild AVR, EF: 45-50%. Recent Adjustments to apixaban and furosemide doses based on age/weight. Most recent Hb: 8.8 on iron supplementation, per PCP note down from baseline of 11.6 (09/14/19). Pt recently had a sleep study completed showing nocturnal hypoxia now on O2 over-night.    Limitations Lifting;Standing;Walking    How long can you sit comfortably? unlimited    How long can you stand comfortably? 15-20 minutes    How long can you walk comfortably? unknown, has been mostly performing household mobility since DC    Patient Stated Goals improved functional tolerance to AMB regarding breathing limitations    Currently in Pain? No/denies              INTERVENTION THIS DATE:  -Standing hip abduction with UE support - 2 x 8 - Standing side stepping - 4 x 8 steps  -Ambulation with use of FWW - 2x334ft, x 266ft with focus on performing with speed -Standing hip extension with UE support - x 10 -Four square stepping - x 10    Performed to improve functional capacity   PT Education - 01/15/20 1446    Education Details form/technique with exercises    Person(s) Educated Patient    Methods Explanation;Demonstration    Comprehension Verbalized understanding;Returned demonstration  PT Short Term Goals - 12/25/19 1957      PT SHORT TERM GOAL #1   Title After 4 weeks pt will tolerate 4 minutes sustained AMB c gait speed > 0.20m/s to improve capacity to perform safe household distance AMB.    Baseline At evaluation 12/25/19: 3 minute tolerance at 0.14m/s c RW    Time 4    Period Weeks    Status New    Target Date 01/22/20      PT SHORT TERM GOAL #2   Title After 4 weeks pt to demonstrate improved 5xSTS from 19" surface, hands free in <16sec.    Baseline At eval 12/25/19: 20sec from chair with hand push-off.    Time 4    Period Weeks    Status New    Target  Date 01/22/20             PT Long Term Goals - 12/25/19 2000      PT LONG TERM GOAL #1   Title After 8 weeks pt to demonstrate ability to perform 5 continuous minutes AMB @ >0.23m/s c RW.    Baseline 345ft; 05/22/2019: 673ft; 06/19/2019: 572ft; 08/01/2019: at eval: 3 minutes only or 373ft.    Time 8    Period Weeks    Status New    Target Date 02/19/20      PT LONG TERM GOAL #2   Title After 8 weeks pt will demonstrate improve SLS >10sec BILAT at supervision level, no UE for recovery.    Baseline At eval 12/25/19: RLE ~3sec; LLE ~1sec;    Time 8    Period Weeks    Status New    Target Date 02/19/20      PT LONG TERM GOAL #3   Title At 12 weeks pt will tolerate 15 minutes moderate aerobic actiivty on recumbent stepper with supplemental O2 PRN to prepar for community based exercise compliance at DC.    Baseline Not performing    Time 12    Period Weeks    Status New    Target Date 03/18/20      PT LONG TERM GOAL #4   Title In 12 weeks patient will improve 5 x STS to under 12sec to decrease fall risk and improve LE functional strength.    Baseline 5xSTS: 27 sec; 05/22/2019: 20 sec; 06/19/2019: 20sec; 08/01/2019: Eval 12/25/19: 20sec    Time 12    Period Weeks    Status New    Target Date 03/18/20                 Plan - 01/15/20 1458    Clinical Impression Statement Patient continues to demonstrate early onset of fatigue with ambulation, continued to address DOE throughout therapy. Patient able to tolerate greater amount of standing exerises today compared to previous sessions. She continues to have increased difficulties and will continue to addtess these throughout therapy. Patient will benefit from further skilled therapy to return to prior level of function.    Personal Factors and Comorbidities Comorbidity 2;Past/Current Experience    Comorbidities CHF, DVT, THA, CVA, "lung disorder"    Examination-Activity Limitations Carry;Lift;Locomotion  Level;Stand;Stairs;Bend;Bathing;Dressing    Examination-Participation Restrictions Cleaning;Community Activity;Meal Prep    Stability/Clinical Decision Making Evolving/Moderate complexity    Rehab Potential Fair    Clinical Impairments Affecting Rehab Potential (+) highly motivated, family support (-) age, CHF, lack of opportunies for physical activity    PT Frequency 2x / week    PT Duration 12 weeks  PT Treatment/Interventions Electrical Stimulation;Iontophoresis 4mg /ml Dexamethasone;Aquatic Therapy;Cryotherapy;Moist Heat;Therapeutic activities;Patient/family education;Manual techniques;Balance training;Stair training;Gait training;Neuromuscular re-education;Therapeutic exercise;Passive range of motion    PT Next Visit Plan establish HEP    PT Home Exercise Plan not yet established    Consulted and Agree with Plan of Care Patient;Family member/caregiver    Family Member Consulted Son           Patient will benefit from skilled therapeutic intervention in order to improve the following deficits and impairments:  Abnormal gait, Pain, Decreased coordination, Decreased mobility, Increased muscle spasms, Decreased endurance, Decreased range of motion, Decreased strength, Decreased balance, Difficulty walking, Decreased activity tolerance, Cardiopulmonary status limiting activity, Dizziness  Visit Diagnosis: Difficulty in walking, not elsewhere classified  Muscle weakness (generalized)  Pain in right hip     Problem List Patient Active Problem List   Diagnosis Date Noted  . Malnutrition of moderate degree 11/08/2019  . COPD with acute exacerbation (HCC) 11/06/2019  . S/P laparoscopic cholecystectomy 10/30/19 11/06/2019  . CHF (congestive heart failure) (HCC) 11/06/2019  . CKD (chronic kidney disease) stage 3, GFR 30-59 ml/min 11/06/2019  . Acute on chronic systolic CHF (congestive heart failure) (HCC) 11/06/2019  . Diastolic CHF (HCC) 11/06/2019  . Acute respiratory failure with  hypoxia (HCC)   . Acute kidney injury superimposed on CKD (HCC)   . Acute cholecystitis 08/31/2019  . RUQ pain   . Chronic obstructive pulmonary disease (HCC)   . Acute DVT (deep venous thrombosis) (HCC) 04/29/2018  . 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 AF (paroxysmal atrial fibrillation) (HCC)   . Chronic systolic CHF (congestive heart failure) (HCC)   . Osteoporosis, post-menopausal   . Osteoarthritis, multiple sites     05/06/2013 01/15/2020, 3:09 PM  [place] [facility] PHYSICAL AND SPORTS MEDICINE [address], [place] Phone: [phone]   Fax:  [phone]  Name: Marla Pouliot MRN: Genia Hotter Date of Birth: [birth year]

## 2020-01-22 ENCOUNTER — Other Ambulatory Visit: Payer: Self-pay

## 2020-01-22 ENCOUNTER — Ambulatory Visit: Payer: Medicare Other | Attending: Geriatric Medicine

## 2020-01-22 DIAGNOSIS — M25551 Pain in right hip: Secondary | ICD-10-CM

## 2020-01-22 DIAGNOSIS — R262 Difficulty in walking, not elsewhere classified: Secondary | ICD-10-CM | POA: Diagnosis present

## 2020-01-22 DIAGNOSIS — M6281 Muscle weakness (generalized): Secondary | ICD-10-CM | POA: Diagnosis present

## 2020-01-22 NOTE — Therapy (Addendum)
[facility] [address] [place], [address] Phone: [phone]   Fax:  [phone]  Physical Therapy Treatment  Patient Details  Name: Diana Powell MRN: [id number] Date of Birth: 1926/10/19 Referring Provider (PT): Clarisa Kindred, FNP   Encounter Date: 01/22/2020   PT End of Session - 01/22/20 1437    Visit Number 5    Number of Visits 24    Date for PT Re-Evaluation 03/18/20    Authorization Type MCR    Authorization Time Period 12/25/19-03/18/20    Authorization - Visit Number 4    Authorization - Number of Visits 16    PT Start Time 1430    PT Stop Time 1515    PT Time Calculation (min) 45 min    Equipment Utilized During Treatment Gait belt    Activity Tolerance Patient limited by fatigue;Patient tolerated treatment well;Treatment limited secondary to medical complications (Comment)    Behavior During Therapy [facility] for tasks assessed/performed           Past Medical History:  Diagnosis Date  . Atrial fibrillation (HCC)   . CHF (congestive heart failure) (HCC)   . Chronic systolic heart failure (HCC)   . Complication of anesthesia    CONFUSION   . COPD (chronic obstructive pulmonary disease) (HCC)   . Osteoarthritis, multiple sites   . Osteoporosis, post-menopausal   . Presence of permanent cardiac pacemaker     Past Surgical History:  Procedure Laterality Date  . CARDIOVERSION  ~2009  . HIP FRACTURE SURGERY Bilateral [phone]   each hip fractured at separate times  . INSERT / REPLACE / REMOVE PACEMAKER    . IR EXCHANGE BILIARY DRAIN  10/05/2019  . TOTAL HIP ARTHROPLASTY Right 04/20/2018   Procedure: TOTAL HIP CONVERSION;  Surgeon: Kennedy Bucker, MD;  Location: [facility] ORS;  Service: Orthopedics;  Laterality: Right;    There were no vitals filed for this visit.   Subjective Assessment - 01/22/20 1435    Subjective Patients son reports an episode of back pain that occurred over the weekend but  has decreased since. 3 /10 pain    Patient is accompained by: Family member    Pertinent History ECHO 09/02/19: Moderate MVR, moderate TVR, mild AVR, EF: 45-50%. Recent Adjustments to apixaban and furosemide doses based on age/weight. Most recent Hb: 8.8 on iron supplementation, per PCP note down from baseline of 11.6 (09/14/19). Pt recently had a sleep study completed showing nocturnal hypoxia now on O2 over-night.    Limitations Lifting;Standing;Walking    How long can you sit comfortably? unlimited    How long can you stand comfortably? 15-20 minutes    How long can you walk comfortably? unknown, has been mostly performing household mobility since DC    Patient Stated Goals improved functional tolerance to AMB regarding breathing limitations    Currently in Pain? Yes    Pain Location Back    Pain Onset More than a month ago          INTERVENTION THIS DATE: -Ambulation using FWW 2x260ft (SpO2:96% HR:83 bpm; after ambulation)) -Glute Bridge 2x10  -LTR x30 -Standing Lateral Step with UE Support x10 bilat. (SpO2:93% HR:80 bpm) -Standing Hip Abduction with UE Support x10 bilat. (SpO2:96% HR:70 bpm) -Standing Hip Extension with UE Support x10 bilat. (SpO2:95% HR:74 bpm)  Performed Exercises to improve function capacity and decrease pain       PT Education - 01/22/20 1437    Education Details form/technique with  exercise    Person(s) Educated Patient    Methods Explanation;Demonstration    Comprehension Verbalized understanding;Returned demonstration            PT Short Term Goals - 12/25/19 1957      PT SHORT TERM GOAL #1   Title After 4 weeks pt will tolerate 4 minutes sustained AMB c gait speed > 0.42m/s to improve capacity to perform safe household distance AMB.    Baseline At evaluation 12/25/19: 3 minute tolerance at 0.84m/s c RW    Time 4    Period Weeks    Status New    Target Date 01/22/20      PT SHORT TERM GOAL #2   Title After 4 weeks pt to demonstrate improved  5xSTS from 19" surface, hands free in <16sec.    Baseline At eval 12/25/19: 20sec from chair with hand push-off.    Time 4    Period Weeks    Status New    Target Date 01/22/20             PT Long Term Goals - 12/25/19 2000      PT LONG TERM GOAL #1   Title After 8 weeks pt to demonstrate ability to perform 5 continuous minutes AMB @ >0.66m/s c RW.    Baseline 397ft; 05/22/2019: 673ft; 06/19/2019: 565ft; 08/01/2019: at eval: 3 minutes only or 363ft.    Time 8    Period Weeks    Status New    Target Date 02/19/20      PT LONG TERM GOAL #2   Title After 8 weeks pt will demonstrate improve SLS >10sec BILAT at supervision level, no UE for recovery.    Baseline At eval 12/25/19: RLE ~3sec; LLE ~1sec;    Time 8    Period Weeks    Status New    Target Date 02/19/20      PT LONG TERM GOAL #3   Title At 12 weeks pt will tolerate 15 minutes moderate aerobic actiivty on recumbent stepper with supplemental O2 PRN to prepar for community based exercise compliance at DC.    Baseline Not performing    Time 12    Period Weeks    Status New    Target Date 03/18/20      PT LONG TERM GOAL #4   Title In 12 weeks patient will improve 5 x STS to under 12sec to decrease fall risk and improve LE functional strength.    Baseline 5xSTS: 27 sec; 05/22/2019: 20 sec; 06/19/2019: 20sec; 08/01/2019: Eval 12/25/19: 20sec    Time 12    Period Weeks    Status New    Target Date 03/18/20            Plan - 01/22/20 1438    Clinical Impression Statement Patient continues to become quickly fatigued during ambulation and exercises; DOE monitored throughout therapy. Patient still requires many rest breaks throughout session. Patient continues to have deficits which will be addressed throughout therapy sessions. Patient will benefit from skilled PT to return to PLOF.    Personal Factors and Comorbidities Comorbidity 2;Past/Current Experience    Comorbidities CHF, DVT, THA, CVA, "lung disorder"     Examination-Activity Limitations Carry;Lift;Locomotion Level;Stand;Stairs;Bend;Bathing;Dressing    Examination-Participation Restrictions Cleaning;Community Activity;Meal Prep    Stability/Clinical Decision Making Evolving/Moderate complexity    Clinical Decision Making Moderate    Rehab Potential Fair    Clinical Impairments Affecting Rehab Potential (+) highly motivated, family support (-) age, CHF, lack of opportunies  for physical activity    PT Frequency 2x / week    PT Duration 12 weeks    PT Treatment/Interventions Electrical Stimulation;Iontophoresis 4mg /ml Dexamethasone;Aquatic Therapy;Cryotherapy;Moist Heat;Therapeutic activities;Patient/family education;Manual techniques;Balance training;Stair training;Gait training;Neuromuscular re-education;Therapeutic exercise;Passive range of motion    PT Next Visit Plan establish HEP    PT Home Exercise Plan not yet established    Consulted and Agree with Plan of Care Patient;Family member/caregiver    Family Member Consulted Son           Patient will benefit from skilled therapeutic intervention in order to improve the following deficits and impairments:  Abnormal gait, Pain, Decreased coordination, Decreased mobility, Increased muscle spasms, Decreased endurance, Decreased range of motion, Decreased strength, Decreased balance, Difficulty walking, Decreased activity tolerance, Cardiopulmonary status limiting activity, Dizziness  Visit Diagnosis: Difficulty in walking, not elsewhere classified  Muscle weakness (generalized)  Pain in right hip     Problem List Patient Active Problem List   Diagnosis Date Noted  . Malnutrition of moderate degree 11/08/2019  . COPD with acute exacerbation (HCC) 11/06/2019  . S/P laparoscopic cholecystectomy 10/30/19 11/06/2019  . CHF (congestive heart failure) (HCC) 11/06/2019  . CKD (chronic kidney disease) stage 3, GFR 30-59 ml/min 11/06/2019  . Acute on chronic systolic CHF (congestive heart  failure) (HCC) 11/06/2019  . Diastolic CHF (HCC) 11/06/2019  . Acute respiratory failure with hypoxia (HCC)   . Acute kidney injury superimposed on CKD (HCC)   . Acute cholecystitis 08/31/2019  . RUQ pain   . Chronic obstructive pulmonary disease (HCC)   . Acute DVT (deep venous thrombosis) (HCC) 04/29/2018  . 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 AF (paroxysmal atrial fibrillation) (HCC)   . Chronic systolic CHF (congestive heart failure) (HCC)   . Osteoporosis, post-menopausal   . Osteoarthritis, multiple sites    3:15 PM, 01/22/20 03/24/20, SPT Student Physical Therapist [place]  [phone]  [phone] 01/22/2020, 3:12 PM  [facility] PHYS[facility] SPORTS MEDICINE [address], [address], [place] Phone: [phone]   Fax:  [phone]  Name: Diana Powell MRN: Genia Hotter Date of Birth: 1927-01-10

## 2020-01-29 ENCOUNTER — Ambulatory Visit: Payer: Medicare Other

## 2020-01-29 ENCOUNTER — Other Ambulatory Visit: Payer: Self-pay

## 2020-01-29 DIAGNOSIS — R262 Difficulty in walking, not elsewhere classified: Secondary | ICD-10-CM | POA: Diagnosis not present

## 2020-01-29 DIAGNOSIS — M6281 Muscle weakness (generalized): Secondary | ICD-10-CM

## 2020-01-29 DIAGNOSIS — M25551 Pain in right hip: Secondary | ICD-10-CM

## 2020-01-29 NOTE — Therapy (Signed)
[place] [facility] [address] [place], [place], [address] Phone: [phone]   Fax:  [phone]  Physical Therapy Treatment  Patient Details  Name: Diana Powell MRN: [id number] Date of Birth: 10-Nov-1926 Referring Provider (PT): Clarisa Kindred, FNP   Encounter Date: 01/29/2020   PT End of Session - 01/29/20 1439    Visit Number 6    Number of Visits 24    Date for PT Re-Evaluation 03/18/20    Authorization Type MCR    Authorization Time Period 12/25/19-03/18/20    Authorization - Visit Number 6    Authorization - Number of Visits 16    PT Start Time 1430    PT Stop Time 1515    PT Time Calculation (min) 45 min    Equipment Utilized During Treatment Gait belt    Activity Tolerance Patient limited by fatigue;Patient tolerated treatment well;Treatment limited secondary to medical complications (Comment)    Behavior During Therapy WFL for tasks assessed/performed           Past Medical History:  Diagnosis Date   Atrial fibrillation (HCC)    CHF (congestive heart failure) (HCC)    Chronic systolic heart failure (HCC)    Complication of anesthesia    CONFUSION    COPD (chronic obstructive pulmonary disease) (HCC)    Osteoarthritis, multiple sites    Osteoporosis, post-menopausal    Presence of permanent cardiac pacemaker     Past Surgical History:  Procedure Laterality Date   CARDIOVERSION  ~2009   HIP FRACTURE SURGERY Bilateral [phone]   each hip fractured at separate times   INSERT / REPLACE / REMOVE PACEMAKER     IR EXCHANGE BILIARY DRAIN  10/05/2019   TOTAL HIP ARTHROPLASTY Right 04/20/2018   Procedure: TOTAL HIP CONVERSION;  Surgeon: Kennedy Bucker, MD;  Location: [facility] ORS;  Service: Orthopedics;  Laterality: Right;    There were no vitals filed for this visit.   Subjective Assessment - 01/29/20 1435    Subjective Patients son reports that patients back is painful today and she reported it  when getting into the car.    Patient is accompained by: Family member    Pertinent History ECHO 09/02/19: Moderate MVR, moderate TVR, mild AVR, EF: 45-50%. Recent Adjustments to apixaban and furosemide doses based on age/weight. Most recent Hb: 8.8 on iron supplementation, per PCP note down from baseline of 11.6 (09/14/19). Pt recently had a sleep study completed showing nocturnal hypoxia now on O2 over-night.    Limitations Lifting;Standing;Walking    How long can you sit comfortably? unlimited    How long can you stand comfortably? 15-20 minutes    How long can you walk comfortably? unknown, has been mostly performing household mobility since DC    Patient Stated Goals improved functional tolerance to AMB regarding breathing limitations    Pain Onset More than a month ago           INTERVENTION THIS DATE: -Cardiovascular conditioning for ABLA; Nustep  2 x 3 minutes LEVEL 2, Seat 6, Arms 6, SPM cued >50, vitals taken q36min.  -Ambulation using FWW 1x312ft (SpO2:96% HR:79 bpm; after ambulation)) -Glute Bridge 2x10  -STS 2x10 without UE Support (SpO2: 95% HR: 125bpm) -Ascending/Descending Stairs x3 Using Handrails with step through pattern -Standing Lateral Step with UE Support x10 bilat. (SpO2:93% HR:85 bpm) -Standing Hip Abduction with UE Support x10 bilat. (SpO2:96% HR:70 bpm) -Standing Hip Extension with UE Support x10 bilat. (SpO2:94% HR:84 bpm) -Standing Hip Flexion  with UE Support x10 bilat. (SpO2:95% HR:84 bpm)   Performed Exercises to improve function capacity and decrease pain     PT Education - 01/29/20 1436    Education Details form/technique with exercises    Person(s) Educated Patient    Methods Explanation;Demonstration    Comprehension Verbalized understanding;Returned demonstration            PT Short Term Goals - 12/25/19 1957      PT SHORT TERM GOAL #1   Title After 4 weeks pt will tolerate 4 minutes sustained AMB c gait speed > 0.69m/s to improve capacity to  perform safe household distance AMB.    Baseline At evaluation 12/25/19: 3 minute tolerance at 0.62m/s c RW    Time 4    Period Weeks    Status New    Target Date 01/22/20      PT SHORT TERM GOAL #2   Title After 4 weeks pt to demonstrate improved 5xSTS from 19" surface, hands free in <16sec.    Baseline At eval 12/25/19: 20sec from chair with hand push-off.    Time 4    Period Weeks    Status New    Target Date 01/22/20             PT Long Term Goals - 12/25/19 2000      PT LONG TERM GOAL #1   Title After 8 weeks pt to demonstrate ability to perform 5 continuous minutes AMB @ >0.33m/s c RW.    Baseline 331ft; 05/22/2019: 6101ft; 06/19/2019: 51ft; 08/01/2019: at eval: 3 minutes only or 358ft.    Time 8    Period Weeks    Status New    Target Date 02/19/20      PT LONG TERM GOAL #2   Title After 8 weeks pt will demonstrate improve SLS >10sec BILAT at supervision level, no UE for recovery.    Baseline At eval 12/25/19: RLE ~3sec; LLE ~1sec;    Time 8    Period Weeks    Status New    Target Date 02/19/20      PT LONG TERM GOAL #3   Title At 12 weeks pt will tolerate 15 minutes moderate aerobic actiivty on recumbent stepper with supplemental O2 PRN to prepar for community based exercise compliance at DC.    Baseline Not performing    Time 12    Period Weeks    Status New    Target Date 03/18/20      PT LONG TERM GOAL #4   Title In 12 weeks patient will improve 5 x STS to under 12sec to decrease fall risk and improve LE functional strength.    Baseline 5xSTS: 27 sec; 05/22/2019: 20 sec; 06/19/2019: 20sec; 08/01/2019: Eval 12/25/19: 20sec    Time 12    Period Weeks    Status New    Target Date 03/18/20                 Plan - 01/29/20 1439    Clinical Impression Statement Patients endurance and strength continues to limit distance while ambulating and during exercises.  Endurance and strength will continue to be addressed to help improve patients tolerance to exercise  during sessions and limit the amount of rest breaks required due to fatigue. Patient will benefit from skilled therapy to progress towards goals and return to prior level of function.    Personal Factors and Comorbidities Comorbidity 2;Past/Current Experience    Comorbidities CHF, DVT, THA, CVA, "lung disorder"  Examination-Activity Limitations Carry;Lift;Locomotion Level;Stand;Stairs;Bend;Bathing;Dressing    Examination-Participation Restrictions Cleaning;Community Activity;Meal Prep    Stability/Clinical Decision Making Evolving/Moderate complexity    Clinical Decision Making Moderate    Rehab Potential Fair    Clinical Impairments Affecting Rehab Potential (+) highly motivated, family support (-) age, CHF, lack of opportunies for physical activity    PT Frequency 2x / week    PT Duration 12 weeks    PT Treatment/Interventions Electrical Stimulation;Iontophoresis 4mg /ml Dexamethasone;Aquatic Therapy;Cryotherapy;Moist Heat;Therapeutic activities;Patient/family education;Manual techniques;Balance training;Stair training;Gait training;Neuromuscular re-education;Therapeutic exercise;Passive range of motion    PT Next Visit Plan establish HEP    PT Home Exercise Plan not yet established    Consulted and Agree with Plan of Care Patient;Family member/caregiver    Family Member Consulted Son           Patient will benefit from skilled therapeutic intervention in order to improve the following deficits and impairments:  Abnormal gait, Pain, Decreased coordination, Decreased mobility, Increased muscle spasms, Decreased endurance, Decreased range of motion, Decreased strength, Decreased balance, Difficulty walking, Decreased activity tolerance, Cardiopulmonary status limiting activity, Dizziness  Visit Diagnosis: Muscle weakness (generalized)  Pain in right hip  Difficulty in walking, not elsewhere classified     Problem List Patient Active Problem List   Diagnosis Date Noted    Malnutrition of moderate degree 11/08/2019   COPD with acute exacerbation (HCC) 11/06/2019   S/P laparoscopic cholecystectomy 10/30/19 11/06/2019   CHF (congestive heart failure) (HCC) 11/06/2019   CKD (chronic kidney disease) stage 3, GFR 30-59 ml/min 11/06/2019   Acute on chronic systolic CHF (congestive heart failure) (HCC) 11/06/2019   Diastolic CHF (HCC) 11/06/2019   Acute respiratory failure with hypoxia (HCC)    Acute kidney injury superimposed on CKD (HCC)    Acute cholecystitis 08/31/2019   RUQ pain    Chronic obstructive pulmonary disease (HCC)    Acute DVT (deep venous thrombosis) (HCC) 04/29/2018   Status post total hip replacement, right 04/20/2018   Dementia due to medical condition without behavioral disturbance (HCC) 05/04/2013   AF (paroxysmal atrial fibrillation) (HCC)    Chronic systolic CHF (congestive heart failure) (HCC)    Osteoporosis, post-menopausal    Osteoarthritis, multiple sites    3:31 PM, 01/29/20 01/31/20, SPT Student Physical Therapist [place]  [phone]  [phone] 01/29/2020, 3:29 PM  [facility] [address], [place] Phone: [phone]   Fax:  [phone]  Name: Aroura Vasudevan MRN: Genia Hotter Date of Birth: [birth year]

## 2020-02-05 ENCOUNTER — Ambulatory Visit: Payer: Medicare Other

## 2020-02-05 ENCOUNTER — Other Ambulatory Visit: Payer: Self-pay

## 2020-02-05 DIAGNOSIS — M6281 Muscle weakness (generalized): Secondary | ICD-10-CM

## 2020-02-05 DIAGNOSIS — R262 Difficulty in walking, not elsewhere classified: Secondary | ICD-10-CM | POA: Diagnosis not present

## 2020-02-05 DIAGNOSIS — M25551 Pain in right hip: Secondary | ICD-10-CM

## 2020-02-05 NOTE — Therapy (Signed)
[facility] PHYS[facility] SPORTS MEDICINE [address] [place], [address] Phone: [phone]   Fax:  [phone]  Physical Therapy Treatment  Patient Details  Name: Diana Powell MRN: [id number] Date of Birth: April 15, 1927 Referring Provider (PT): Clarisa Kindred, FNP   Encounter Date: 02/05/2020   PT End of Session - 02/05/20 1435    Visit Number 7    Number of Visits 24    Date for PT Re-Evaluation 03/18/20    Authorization Type MCR    Authorization Time Period 12/25/19-03/18/20    Authorization - Visit Number 7    Authorization - Number of Visits 16    PT Start Time 1430    PT Stop Time 1515    PT Time Calculation (min) 45 min    Equipment Utilized During Treatment Gait belt    Activity Tolerance Patient limited by fatigue;Patient tolerated treatment well;Treatment limited secondary to medical complications (Comment)    Behavior During Therapy [facility] for tasks assessed/performed           Past Medical History:  Diagnosis Date  . Atrial fibrillation (HCC)   . CHF (congestive heart failure) (HCC)   . Chronic systolic heart failure (HCC)   . Complication of anesthesia    CONFUSION   . COPD (chronic obstructive pulmonary disease) (HCC)   . Osteoarthritis, multiple sites   . Osteoporosis, post-menopausal   . Presence of permanent cardiac pacemaker     Past Surgical History:  Procedure Laterality Date  . CARDIOVERSION  ~2009  . HIP FRACTURE SURGERY Bilateral [phone]   each hip fractured at separate times  . INSERT / REPLACE / REMOVE PACEMAKER    . IR EXCHANGE BILIARY DRAIN  10/05/2019  . TOTAL HIP ARTHROPLASTY Right 04/20/2018   Procedure: TOTAL HIP CONVERSION;  Surgeon: Kennedy Bucker, MD;  Location: [facility] ORS;  Service: Orthopedics;  Laterality: Right;    There were no vitals filed for this visit.   Subjective Assessment - 02/05/20 1432    Subjective Patients son reports that patients back continues to give her issues and the  patient feels the pain "wrap around"    Patient is accompained by: Family member    Pertinent History ECHO 09/02/19: Moderate MVR, moderate TVR, mild AVR, EF: 45-50%. Recent Adjustments to apixaban and furosemide doses based on age/weight. Most recent Hb: 8.8 on iron supplementation, per PCP note down from baseline of 11.6 (09/14/19). Pt recently had a sleep study completed showing nocturnal hypoxia now on O2 over-night.    Limitations Lifting;Standing;Walking    How long can you sit comfortably? unlimited    How long can you stand comfortably? 15-20 minutes    How long can you walk comfortably? unknown, has been mostly performing household mobility since DC    Patient Stated Goals improved functional tolerance to AMB regarding breathing limitations    Currently in Pain? Yes    Pain Score 3     Pain Location Back    Pain Onset More than a month ago            INTERVENTION THIS DATE: -Cardiovascular conditioning for ABLA; Nustep 2 x 4 minutes LEVEL 2, Seat 7, Arms 6, SPM cued >50, vitals taken q31min.  -Ambulation using FWW 2x380ft (SpO2:95% HR:77 bpm; after ambulation)) -STS 2x10 without UE Support (SpO2: 97% HR: 123bpm) -Ascending/Descending Stairs x3 Using Handrails with step through pattern (SpO2:96% HR:74 bpm) -Standing Lateral Step with UE Support x10 bilat. (SpO2:96% HR:80 bpm) -Standing Hip Extension with UE  Support x10 bilat. (SpO2:94% HR:84 bpm) -Standing Hip Flexion with UE Support x10 bilat. (SpO2:95% HR:84 bpm) - ALL EXERCISES ABOVE PERFORMED IN A 4 STEP PATTERN    Performed Exercises to improve function capacity and decrease pain     PT Education - 02/05/20 1435    Education Details form/technique with exercises    Person(s) Educated Patient    Methods Demonstration;Explanation    Comprehension Verbalized understanding;Returned demonstration            PT Short Term Goals - 12/25/19 1957      PT SHORT TERM GOAL #1   Title After 4 weeks pt will tolerate 4 minutes  sustained AMB c gait speed > 0.44m/s to improve capacity to perform safe household distance AMB.    Baseline At evaluation 12/25/19: 3 minute tolerance at 0.69m/s c RW    Time 4    Period Weeks    Status New    Target Date 01/22/20      PT SHORT TERM GOAL #2   Title After 4 weeks pt to demonstrate improved 5xSTS from 19" surface, hands free in <16sec.    Baseline At eval 12/25/19: 20sec from chair with hand push-off.    Time 4    Period Weeks    Status New    Target Date 01/22/20             PT Long Term Goals - 12/25/19 2000      PT LONG TERM GOAL #1   Title After 8 weeks pt to demonstrate ability to perform 5 continuous minutes AMB @ >0.16m/s c RW.    Baseline 39ft; 05/22/2019: 651ft; 06/19/2019: 587ft; 08/01/2019: at eval: 3 minutes only or 365ft.    Time 8    Period Weeks    Status New    Target Date 02/19/20      PT LONG TERM GOAL #2   Title After 8 weeks pt will demonstrate improve SLS >10sec BILAT at supervision level, no UE for recovery.    Baseline At eval 12/25/19: RLE ~3sec; LLE ~1sec;    Time 8    Period Weeks    Status New    Target Date 02/19/20      PT LONG TERM GOAL #3   Title At 12 weeks pt will tolerate 15 minutes moderate aerobic actiivty on recumbent stepper with supplemental O2 PRN to prepar for community based exercise compliance at DC.    Baseline Not performing    Time 12    Period Weeks    Status New    Target Date 03/18/20      PT LONG TERM GOAL #4   Title In 12 weeks patient will improve 5 x STS to under 12sec to decrease fall risk and improve LE functional strength.    Baseline 5xSTS: 27 sec; 05/22/2019: 20 sec; 06/19/2019: 20sec; 08/01/2019: Eval 12/25/19: 20sec    Time 12    Period Weeks    Status New    Target Date 03/18/20                 Plan - 02/05/20 1435    Clinical Impression Statement Patient was able to increase ambulation distance, but she continues to have deficits in endurance and strength which is demonstrated by  repeated rest breaks after short bouts of exercise and ambulation. Will continue to address these deficits so patient can continue to progress towards goals and return to prior level of function through the benefits of skilled therapy.  Personal Factors and Comorbidities Comorbidity 2;Past/Current Experience    Comorbidities CHF, DVT, THA, CVA, "lung disorder"    Examination-Activity Limitations Carry;Lift;Locomotion Level;Stand;Stairs;Bend;Bathing;Dressing    Examination-Participation Restrictions Cleaning;Community Activity;Meal Prep    Stability/Clinical Decision Making Evolving/Moderate complexity    Clinical Decision Making Moderate    Rehab Potential Fair    Clinical Impairments Affecting Rehab Potential (+) highly motivated, family support (-) age, CHF, lack of opportunies for physical activity    PT Frequency 2x / week    PT Duration 12 weeks    PT Treatment/Interventions Electrical Stimulation;Iontophoresis 4mg /ml Dexamethasone;Aquatic Therapy;Cryotherapy;Moist Heat;Therapeutic activities;Patient/family education;Manual techniques;Balance training;Stair training;Gait training;Neuromuscular re-education;Therapeutic exercise;Passive range of motion    PT Next Visit Plan establish HEP    PT Home Exercise Plan not yet established    Consulted and Agree with Plan of Care Patient;Family member/caregiver    Family Member Consulted Son           Patient will benefit from skilled therapeutic intervention in order to improve the following deficits and impairments:  Abnormal gait, Pain, Decreased coordination, Decreased mobility, Increased muscle spasms, Decreased endurance, Decreased range of motion, Decreased strength, Decreased balance, Difficulty walking, Decreased activity tolerance, Cardiopulmonary status limiting activity, Dizziness  Visit Diagnosis: Muscle weakness (generalized)  Pain in right hip  Difficulty in walking, not elsewhere classified     Problem List Patient  Active Problem List   Diagnosis Date Noted  . Malnutrition of moderate degree 11/08/2019  . COPD with acute exacerbation (HCC) 11/06/2019  . S/P laparoscopic cholecystectomy 10/30/19 11/06/2019  . CHF (congestive heart failure) (HCC) 11/06/2019  . CKD (chronic kidney disease) stage 3, GFR 30-59 ml/min 11/06/2019  . Acute on chronic systolic CHF (congestive heart failure) (HCC) 11/06/2019  . Diastolic CHF (HCC) 11/06/2019  . Acute respiratory failure with hypoxia (HCC)   . Acute kidney injury superimposed on CKD (HCC)   . Acute cholecystitis 08/31/2019  . RUQ pain   . Chronic obstructive pulmonary disease (HCC)   . Acute DVT (deep venous thrombosis) (HCC) 04/29/2018  . 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 AF (paroxysmal atrial fibrillation) (HCC)   . Chronic systolic CHF (congestive heart failure) (HCC)   . Osteoporosis, post-menopausal   . Osteoarthritis, multiple sites    3:17 PM, 02/05/20 02/07/20, [facility] Student Physical Therapist [facility]  [phone]  [phone] 02/05/2020, 3:16 PM  [place] [facility] PHYSICAL AND SPORTS MEDICINE [address], [address], [place] Phone: [phone]   Fax:  [phone]  Name: Erica Richwine MRN: Genia Hotter Date of Birth: Apr 28, 1927

## 2020-02-06 IMAGING — US US EXTREM LOW VENOUS*R*
1 series · 13 of 24 positions shown · non-contrast
Comparison: None.

CLINICAL DATA: Right leg swelling after hip surgery.



[Series 1: us extrem low venous*right* · 13 of 38 slices shown]
[im 1/38]
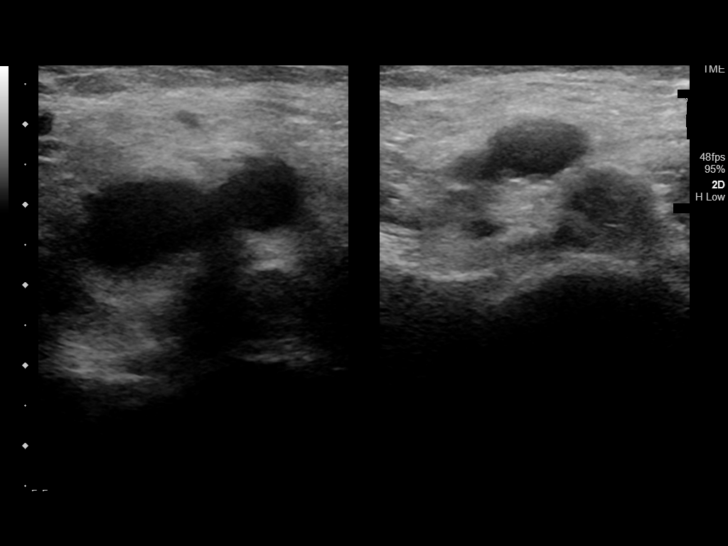
[im 4/38]
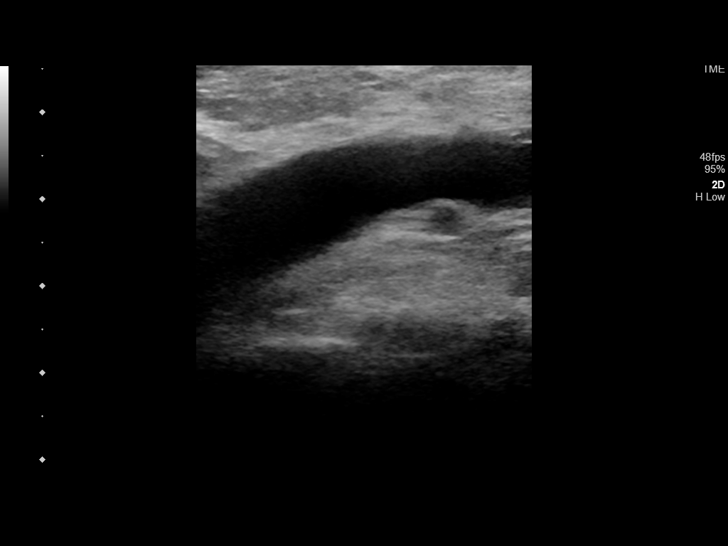
[im 7/38]
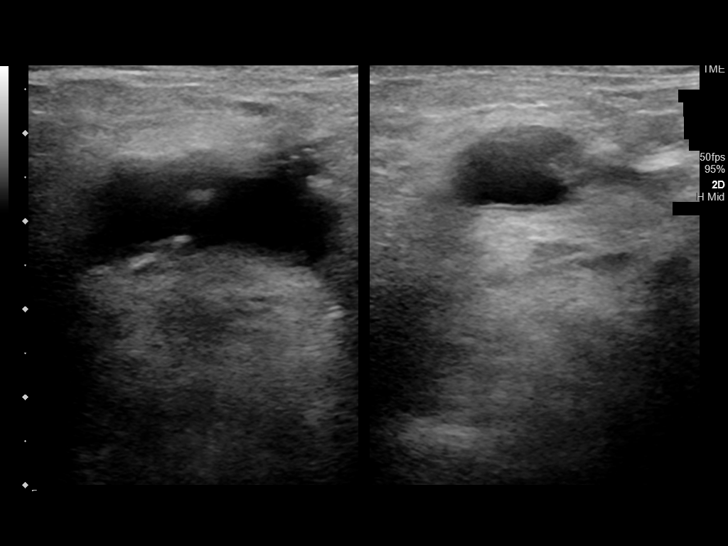
[im 10/38]
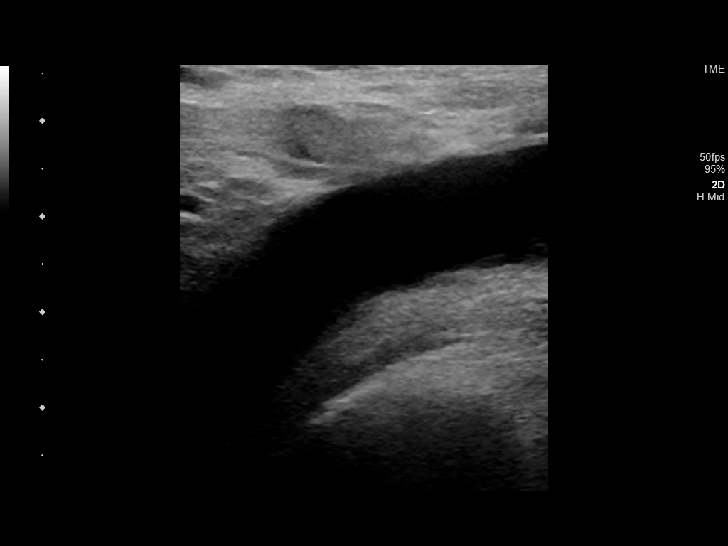
[im 13/38]
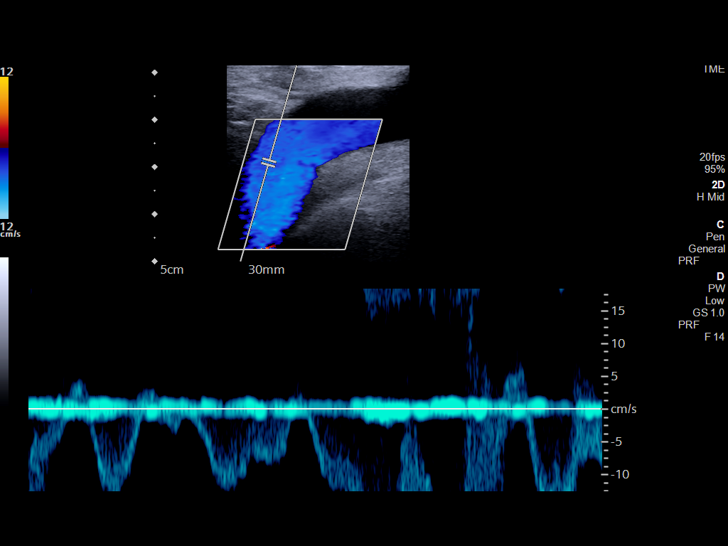
[im 17/38]
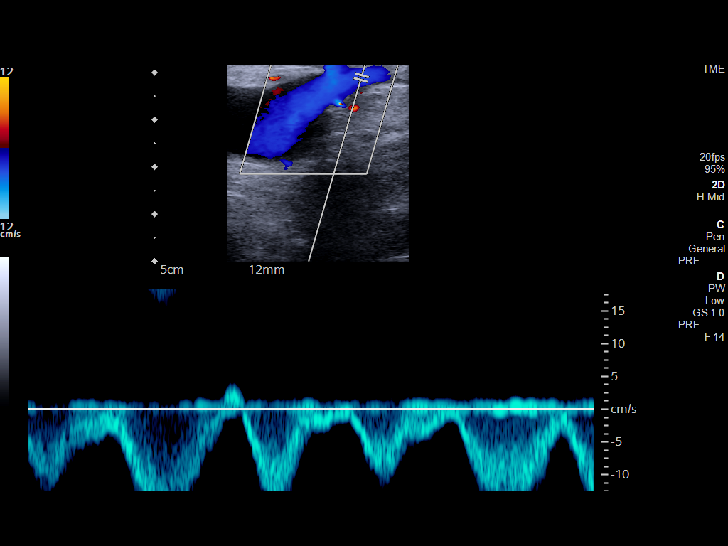
[im 20/38]
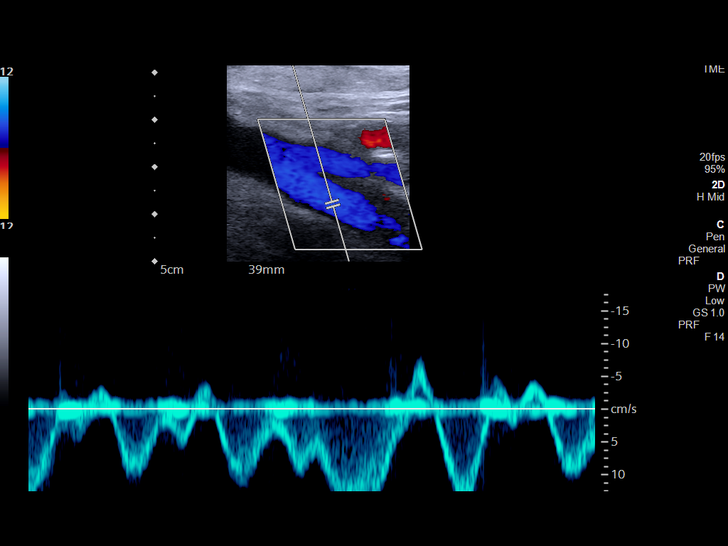
[im 21/38]
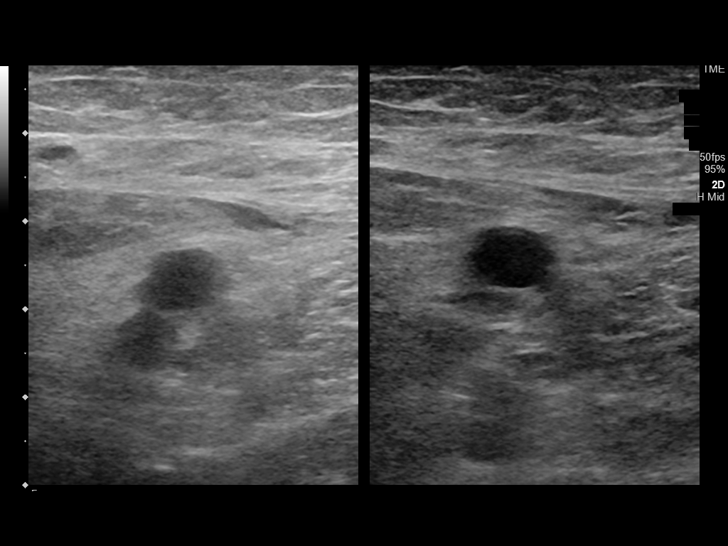
[im 25/38]
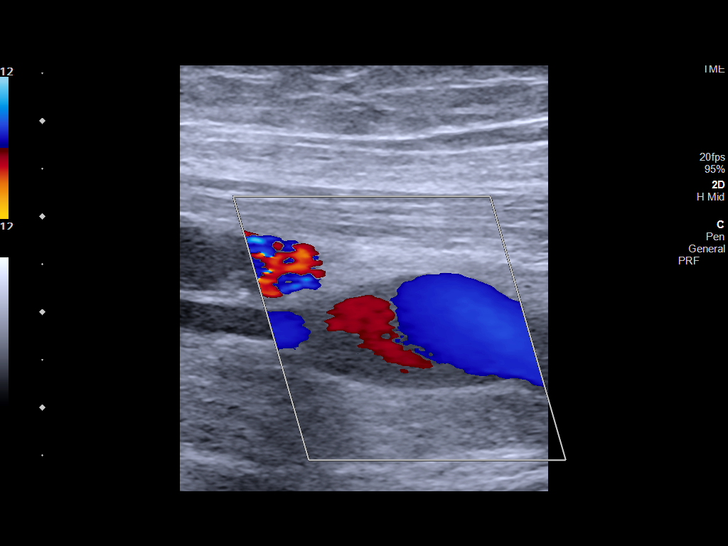
[im 28/38]
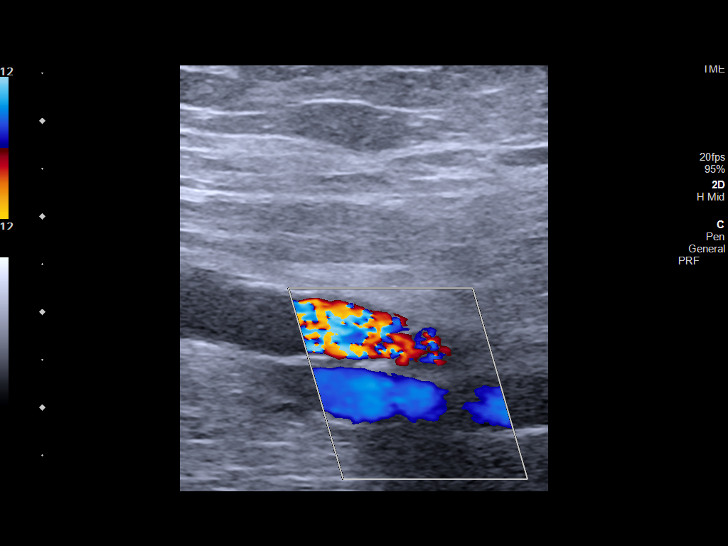
[im 31/38]
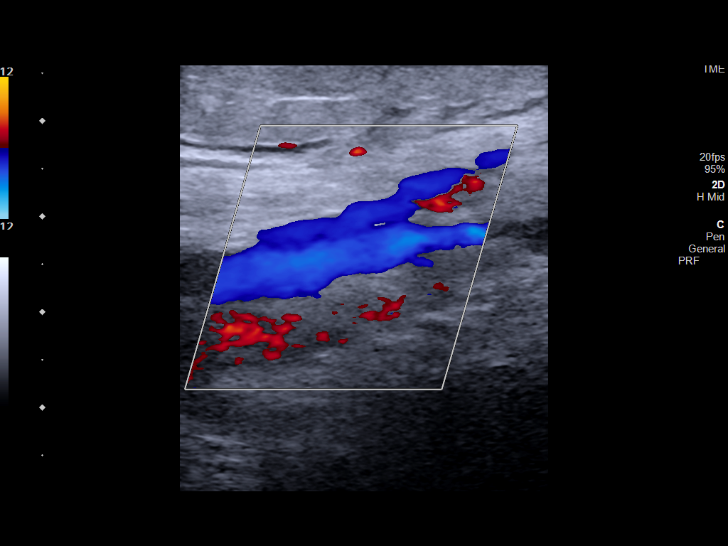
[im 34/38]
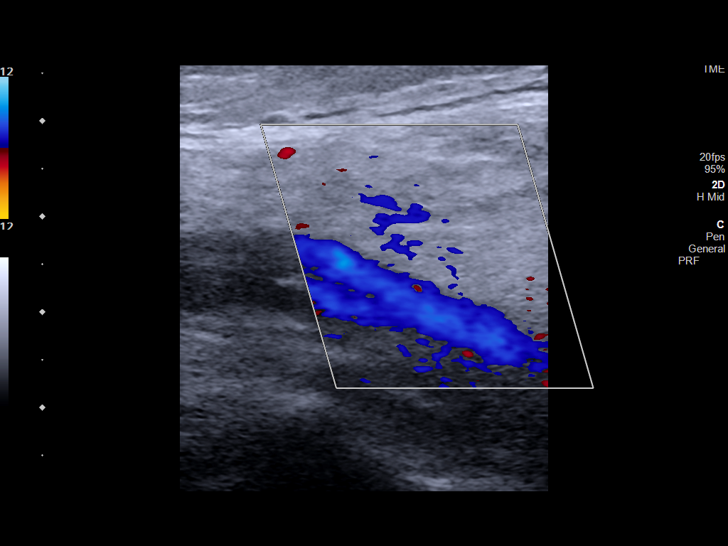
[im 38/38]
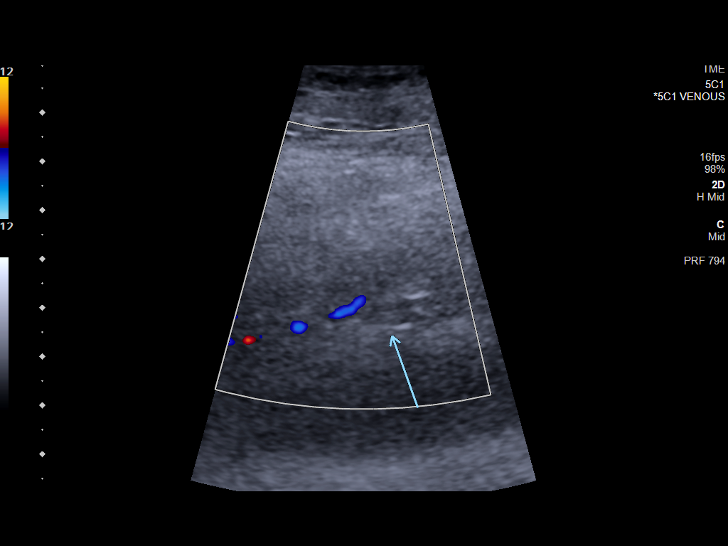

[13 of 24 positions shown; findings below may reference images not displayed]

FINDINGS: Contralateral Common Femoral Vein: Respiratory phasicity is normal
and symmetric with the symptomatic side. No evidence of thrombus.
Normal compressibility.

Common Femoral Vein: No evidence of thrombus. Normal
compressibility, respiratory phasicity and response to augmentation.

Saphenofemoral Junction: No evidence of thrombus. Normal
compressibility and flow on color Doppler imaging.

Profunda Femoral Vein: No evidence of thrombus. Normal
compressibility and flow on color Doppler imaging.

Femoral Vein: Nonocclusive partial filling defect with incomplete
compressibility of the distal right superficial femoral vein.

Popliteal Vein: No evidence of thrombus. Normal compressibility,
respiratory phasicity and response to augmentation.

Calf Veins: Nonocclusive partial filling defect with incomplete
compressibility of the calf veins.

Superficial Great Saphenous Vein: No evidence of thrombus. Normal
compressibility.

Venous Reflux:  None.

Other Findings:  None.
IMPRESSION: Positive for deep venous thrombosis. Nonocclusive thrombus
demonstrated in the distal superficial femoral vein and in the calf
veins.

These results were called by telephone at the time of interpretation
on 04/28/2018 at [DATE] to Dr. ONOMA CONCEPTS , who verbally
acknowledged these results.

## 2020-02-12 ENCOUNTER — Ambulatory Visit: Payer: Medicare Other

## 2020-02-12 ENCOUNTER — Other Ambulatory Visit: Payer: Self-pay

## 2020-02-12 DIAGNOSIS — R262 Difficulty in walking, not elsewhere classified: Secondary | ICD-10-CM | POA: Diagnosis not present

## 2020-02-12 DIAGNOSIS — M6281 Muscle weakness (generalized): Secondary | ICD-10-CM

## 2020-02-12 DIAGNOSIS — M25551 Pain in right hip: Secondary | ICD-10-CM

## 2020-02-12 NOTE — Therapy (Signed)
[facility]TS MEDICINE [address], [place], [address] Phone: [phone]   Fax:  [phone]  Physical Therapy Treatment  Patient Details  Name: Diana Powell MRN: [id number] Date of Birth: Jun 25, 1927 Referring Provider (PT): Clarisa Kindred, FNP   Encounter Date: 02/12/2020   PT End of Session - 02/12/20 1434    Visit Number 8    Number of Visits 24    Date for PT Re-Evaluation 03/18/20    Authorization Type MCR    Authorization Time Period 12/25/19-03/18/20    Authorization - Visit Number 8    Authorization - Number of Visits 16    PT Start Time 1430    PT Stop Time 1515    PT Time Calculation (min) 45 min    Equipment Utilized During Treatment Gait belt    Activity Tolerance Patient limited by fatigue;Patient tolerated treatment well;Treatment limited secondary to medical complications (Comment)    Behavior During Therapy [facility] for tasks assessed/performed           Past Medical History:  Diagnosis Date  . Atrial fibrillation (HCC)   . CHF (congestive heart failure) (HCC)   . Chronic systolic heart failure (HCC)   . Complication of anesthesia    CONFUSION   . COPD (chronic obstructive pulmonary disease) (HCC)   . Osteoarthritis, multiple sites   . Osteoporosis, post-menopausal   . Presence of permanent cardiac pacemaker     Past Surgical History:  Procedure Laterality Date  . CARDIOVERSION  ~2009  . HIP FRACTURE SURGERY Bilateral [phone]   each hip fractured at separate times  . INSERT / REPLACE / REMOVE PACEMAKER    . IR EXCHANGE BILIARY DRAIN  10/05/2019  . TOTAL HIP ARTHROPLASTY Right 04/20/2018   Procedure: TOTAL HIP CONVERSION;  Surgeon: Kennedy Bucker, MD;  Location: [facility] ORS;  Service: Orthopedics;  Laterality: Right;    There were no vitals filed for this visit.   Subjective Assessment - 02/12/20 1432    Subjective Patients son reported that patient recently went to doctors and has no other  issues to report at todays session.    Patient is accompained by: Family member    Pertinent History ECHO 09/02/19: Moderate MVR, moderate TVR, mild AVR, EF: 45-50%. Recent Adjustments to apixaban and furosemide doses based on age/weight. Most recent Hb: 8.8 on iron supplementation, per PCP note down from baseline of 11.6 (09/14/19). Pt recently had a sleep study completed showing nocturnal hypoxia now on O2 over-night.    Limitations Lifting;Standing;Walking    How long can you sit comfortably? unlimited    How long can you stand comfortably? 15-20 minutes    How long can you walk comfortably? unknown, has been mostly performing household mobility since DC    Patient Stated Goals improved functional tolerance to AMB regarding breathing limitations    Currently in Pain? Yes    Pain Score 2     Pain Onset More than a month ago           INTERVENTION THIS DATE: -Cardiovascular conditioning for ABLA; Nustep 2 x 4 minutes LEVEL 2, Seat 7, Arms 6, SPM cued >50, vitals taken q54min.  -Ambulation using FWW 2x473ft (SpO2:93% HR:91 bpm; after ambulation)) -STS 2x10 without UE Support with towel in seat (SpO2: 98% HR: 121bpm) -Lateral Step Over 4.5in x4 bilat. -Step Over and backwards 4.5in x4 bilat -Lateral walk x10 bilat -Forward and back steps x10 bilat.    Performed Exercises to improve function  capacity    PT Education - 02/12/20 1433    Education Details form/technique with exercises    Person(s) Educated Patient    Methods Demonstration;Explanation    Comprehension Verbalized understanding;Returned demonstration            PT Short Term Goals - 12/25/19 1957      PT SHORT TERM GOAL #1   Title After 4 weeks pt will tolerate 4 minutes sustained AMB c gait speed > 0.18m/s to improve capacity to perform safe household distance AMB.    Baseline At evaluation 12/25/19: 3 minute tolerance at 0.54m/s c RW    Time 4    Period Weeks    Status New    Target Date 01/22/20      PT SHORT  TERM GOAL #2   Title After 4 weeks pt to demonstrate improved 5xSTS from 19" surface, hands free in <16sec.    Baseline At eval 12/25/19: 20sec from chair with hand push-off.    Time 4    Period Weeks    Status New    Target Date 01/22/20             PT Long Term Goals - 12/25/19 2000      PT LONG TERM GOAL #1   Title After 8 weeks pt to demonstrate ability to perform 5 continuous minutes AMB @ >0.31m/s c RW.    Baseline 390ft; 05/22/2019: 686ft; 06/19/2019: 536ft; 08/01/2019: at eval: 3 minutes only or 352ft.    Time 8    Period Weeks    Status New    Target Date 02/19/20      PT LONG TERM GOAL #2   Title After 8 weeks pt will demonstrate improve SLS >10sec BILAT at supervision level, no UE for recovery.    Baseline At eval 12/25/19: RLE ~3sec; LLE ~1sec;    Time 8    Period Weeks    Status New    Target Date 02/19/20      PT LONG TERM GOAL #3   Title At 12 weeks pt will tolerate 15 minutes moderate aerobic actiivty on recumbent stepper with supplemental O2 PRN to prepar for community based exercise compliance at DC.    Baseline Not performing    Time 12    Period Weeks    Status New    Target Date 03/18/20      PT LONG TERM GOAL #4   Title In 12 weeks patient will improve 5 x STS to under 12sec to decrease fall risk and improve LE functional strength.    Baseline 5xSTS: 27 sec; 05/22/2019: 20 sec; 06/19/2019: 20sec; 08/01/2019: Eval 12/25/19: 20sec    Time 12    Period Weeks    Status New    Target Date 03/18/20                 Plan - 02/12/20 1434    Clinical Impression Statement Continued to focus on patient's strength and endurance at today's session at which the patient tolerated very well.  Patient's strength and endurance are improving which is demonstrated by ability to perform step-ups, larger steps, and more reps before needing to take a rest break but continues to have difficulty with SOB.  Patient is making progress towards goals and will continue to  benefit from skilled therapy to return to prior level of function.    Personal Factors and Comorbidities Comorbidity 2;Past/Current Experience    Comorbidities CHF, DVT, THA, CVA, "lung disorder"    Examination-Activity  Limitations Carry;Lift;Locomotion Level;Stand;Stairs;Bend;Bathing;Dressing    Examination-Participation Restrictions Cleaning;Community Activity;Meal Prep    Stability/Clinical Decision Making Evolving/Moderate complexity    Clinical Decision Making Moderate    Rehab Potential Fair    Clinical Impairments Affecting Rehab Potential (+) highly motivated, family support (-) age, CHF, lack of opportunies for physical activity    PT Frequency 2x / week    PT Duration 12 weeks    PT Treatment/Interventions Electrical Stimulation;Iontophoresis 4mg /ml Dexamethasone;Aquatic Therapy;Cryotherapy;Moist Heat;Therapeutic activities;Patient/family education;Manual techniques;Balance training;Stair training;Gait training;Neuromuscular re-education;Therapeutic exercise;Passive range of motion    PT Next Visit Plan establish HEP    PT Home Exercise Plan not yet established    Consulted and Agree with Plan of Care Patient;Family member/caregiver    Family Member Consulted Son           Patient will benefit from skilled therapeutic intervention in order to improve the following deficits and impairments:  Abnormal gait, Pain, Decreased coordination, Decreased mobility, Increased muscle spasms, Decreased endurance, Decreased range of motion, Decreased strength, Decreased balance, Difficulty walking, Decreased activity tolerance, Cardiopulmonary status limiting activity, Dizziness  Visit Diagnosis: Muscle weakness (generalized)  Pain in right hip     Problem List Patient Active Problem List   Diagnosis Date Noted  . Malnutrition of moderate degree 11/08/2019  . COPD with acute exacerbation (HCC) 11/06/2019  . S/P laparoscopic cholecystectomy 10/30/19 11/06/2019  . CHF (congestive heart  failure) (HCC) 11/06/2019  . CKD (chronic kidney disease) stage 3, GFR 30-59 ml/min 11/06/2019  . Acute on chronic systolic CHF (congestive heart failure) (HCC) 11/06/2019  . Diastolic CHF (HCC) 11/06/2019  . Acute respiratory failure with hypoxia (HCC)   . Acute kidney injury superimposed on CKD (HCC)   . Acute cholecystitis 08/31/2019  . RUQ pain   . Chronic obstructive pulmonary disease (HCC)   . Acute DVT (deep venous thrombosis) (HCC) 04/29/2018  . 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 AF (paroxysmal atrial fibrillation) (HCC)   . Chronic systolic CHF (congestive heart failure) (HCC)   . Osteoporosis, post-menopausal   . Osteoarthritis, multiple sites    3:31 PM, 02/12/20 02/14/20, SPT Student Physical Therapist [place]  [phone]  [phone] 02/12/2020, 3:28 PM  [place] [facility] PHYSICAL AND SPORTS MEDICINE [address], [address], [place] Phone: [phone]   Fax:  [phone]  Name: Jonella Redditt MRN: Genia Hotter Date of Birth: Sep 06, 1926

## 2020-02-19 ENCOUNTER — Ambulatory Visit: Payer: Medicare Other | Attending: Geriatric Medicine

## 2020-02-19 ENCOUNTER — Other Ambulatory Visit: Payer: Self-pay

## 2020-02-19 DIAGNOSIS — M6281 Muscle weakness (generalized): Secondary | ICD-10-CM | POA: Diagnosis present

## 2020-02-19 DIAGNOSIS — R262 Difficulty in walking, not elsewhere classified: Secondary | ICD-10-CM | POA: Insufficient documentation

## 2020-02-19 DIAGNOSIS — M25551 Pain in right hip: Secondary | ICD-10-CM | POA: Diagnosis present

## 2020-02-19 NOTE — Therapy (Signed)
[facility]ICINE [address] [place], [address] Phone: [phone]   Fax:  [phone]  Physical Therapy Treatment  Patient Details  Name: Diana Powell MRN: [id number] Date of Birth: [birth year] Referring Provider (PT): Clarisa Kindred, FNP   Encounter Date: 02/19/2020   PT End of Session - 02/19/20 1535    Visit Number 9    Number of Visits 24    Date for PT Re-Evaluation 03/18/20    Authorization Type MCR    Authorization Time Period 12/25/19-03/18/20    Authorization - Visit Number 8    Authorization - Number of Visits 16    PT Start Time 1530    PT Stop Time 1615    PT Time Calculation (min) 45 min    Equipment Utilized During Treatment Gait belt    Activity Tolerance Patient limited by fatigue;Patient tolerated treatment well;Treatment limited secondary to medical complications (Comment)    Behavior During Therapy [facility] for tasks assessed/performed           Past Medical History:  Diagnosis Date  . Atrial fibrillation (HCC)   . CHF (congestive heart failure) (HCC)   . Chronic systolic heart failure (HCC)   . Complication of anesthesia    CONFUSION   . COPD (chronic obstructive pulmonary disease) (HCC)   . Osteoarthritis, multiple sites   . Osteoporosis, post-menopausal   . Presence of permanent cardiac pacemaker     Past Surgical History:  Procedure Laterality Date  . CARDIOVERSION  ~2009  . HIP FRACTURE SURGERY Bilateral [id number]   each hip fractured at separate times  . INSERT / REPLACE / REMOVE PACEMAKER    . IR EXCHANGE BILIARY DRAIN  10/05/2019  . TOTAL HIP ARTHROPLASTY Right 04/20/2018   Procedure: TOTAL HIP CONVERSION;  Surgeon: Kennedy Bucker, MD;  Location: [facility] ORS;  Service: Orthopedics;  Laterality: Right;    There were no vitals filed for this visit.   Subjective Assessment - 02/19/20 1534    Subjective Patients son reported that patient has been doiing well and has not had any  issues recently.    Patient is accompained by: Family member    Pertinent History ECHO 09/02/19: Moderate MVR, moderate TVR, mild AVR, EF: 45-50%. Recent Adjustments to apixaban and furosemide doses based on age/weight. Most recent Hb: 8.8 on iron supplementation, per PCP note down from baseline of 11.6 (09/14/19). Pt recently had a sleep study completed showing nocturnal hypoxia now on O2 over-night.    Limitations Lifting;Standing;Walking    How long can you sit comfortably? unlimited    How long can you stand comfortably? 15-20 minutes    How long can you walk comfortably? unknown, has been mostly performing household mobility since DC    Patient Stated Goals improved functional tolerance to AMB regarding breathing limitations    Currently in Pain? No/denies    Pain Onset More than a month ago            INTERVENTION THIS DATE: -Cardiovascular conditioning for ABLA; Nustep 2 x 4 minutes LEVEL 2, Seat 7, Arms 6, SPM cued >50, vitals taken q3min. (SpO2:96% HR:70 bpm, (SpO2: 97% HR: 72 bpm) -Ambulation using FWW 2x4107ft (SpO2:95% HR:82 bpm; after ambulation)) -STS 2x10 without UE Support with towel in seat (SpO2: 98% HR: 121bpm) -Lateral Step Over hurdle x8 bilat. -Step Over and backwards over hurdle x4 bilat -Forward and back steps x10 bilat. -Cone Taps (3 cones in each stack) x8 (SpO2:96% HR:85 bpm) -Snake through  Cones (6 cones) x1 (SpO2:96% HR:80 bpm)    Performed Exercises to improve function capacity     PT Education - 02/19/20 1535    Education Details form/technique with exercise    Person(s) Educated Patient    Methods Explanation;Demonstration    Comprehension Verbalized understanding;Returned demonstration            PT Short Term Goals - 12/25/19 1957      PT SHORT TERM GOAL #1   Title After 4 weeks pt will tolerate 4 minutes sustained AMB c gait speed > 0.52m/s to improve capacity to perform safe household distance AMB.    Baseline At evaluation 12/25/19: 3 minute  tolerance at 0.46m/s c RW    Time 4    Period Weeks    Status New    Target Date 01/22/20      PT SHORT TERM GOAL #2   Title After 4 weeks pt to demonstrate improved 5xSTS from 19" surface, hands free in <16sec.    Baseline At eval 12/25/19: 20sec from chair with hand push-off.    Time 4    Period Weeks    Status New    Target Date 01/22/20             PT Long Term Goals - 12/25/19 2000      PT LONG TERM GOAL #1   Title After 8 weeks pt to demonstrate ability to perform 5 continuous minutes AMB @ >0.33m/s c RW.    Baseline 338ft; 05/22/2019: 6108ft; 06/19/2019: 540ft; 08/01/2019: at eval: 3 minutes only or 320ft.    Time 8    Period Weeks    Status New    Target Date 02/19/20      PT LONG TERM GOAL #2   Title After 8 weeks pt will demonstrate improve SLS >10sec BILAT at supervision level, no UE for recovery.    Baseline At eval 12/25/19: RLE ~3sec; LLE ~1sec;    Time 8    Period Weeks    Status New    Target Date 02/19/20      PT LONG TERM GOAL #3   Title At 12 weeks pt will tolerate 15 minutes moderate aerobic actiivty on recumbent stepper with supplemental O2 PRN to prepar for community based exercise compliance at DC.    Baseline Not performing    Time 12    Period Weeks    Status New    Target Date 03/18/20      PT LONG TERM GOAL #4   Title In 12 weeks patient will improve 5 x STS to under 12sec to decrease fall risk and improve LE functional strength.    Baseline 5xSTS: 27 sec; 05/22/2019: 20 sec; 06/19/2019: 20sec; 08/01/2019: Eval 12/25/19: 20sec    Time 12    Period Weeks    Status New    Target Date 03/18/20                 Plan - 02/19/20 1536    Clinical Impression Statement Worked on patient's strength, endurance, and ability to negotiate obstacles during ambulation at today's session.  Patient is improving but continues to have difficulty with SLS demonstrated by slight LOB during cone taps.  Will continue to challenge this task in future sessions.   Patient will benefit from skilled therapy to return to prior level of function.    Personal Factors and Comorbidities Comorbidity 2;Past/Current Experience    Comorbidities CHF, DVT, THA, CVA, "lung disorder"    Examination-Activity Limitations  Carry;Lift;Locomotion Level;Stand;Stairs;Bend;Bathing;Dressing    Examination-Participation Restrictions Cleaning;Community Activity;Meal Prep    Stability/Clinical Decision Making Evolving/Moderate complexity    Clinical Decision Making Moderate    Rehab Potential Fair    Clinical Impairments Affecting Rehab Potential (+) highly motivated, family support (-) age, CHF, lack of opportunies for physical activity    PT Frequency 2x / week    PT Duration 12 weeks    PT Treatment/Interventions Electrical Stimulation;Iontophoresis 4mg /ml Dexamethasone;Aquatic Therapy;Cryotherapy;Moist Heat;Therapeutic activities;Patient/family education;Manual techniques;Balance training;Stair training;Gait training;Neuromuscular re-education;Therapeutic exercise;Passive range of motion    PT Next Visit Plan establish HEP    PT Home Exercise Plan not yet established    Consulted and Agree with Plan of Care Patient;Family member/caregiver    Family Member Consulted Son           Patient will benefit from skilled therapeutic intervention in order to improve the following deficits and impairments:  Abnormal gait, Pain, Decreased coordination, Decreased mobility, Increased muscle spasms, Decreased endurance, Decreased range of motion, Decreased strength, Decreased balance, Difficulty walking, Decreased activity tolerance, Cardiopulmonary status limiting activity, Dizziness  Visit Diagnosis: Muscle weakness (generalized)  Pain in right hip  Difficulty in walking, not elsewhere classified     Problem List Patient Active Problem List   Diagnosis Date Noted  . Malnutrition of moderate degree 11/08/2019  . COPD with acute exacerbation (HCC) 11/06/2019  . S/P  laparoscopic cholecystectomy 10/30/19 11/06/2019  . CHF (congestive heart failure) (HCC) 11/06/2019  . CKD (chronic kidney disease) stage 3, GFR 30-59 ml/min 11/06/2019  . Acute on chronic systolic CHF (congestive heart failure) (HCC) 11/06/2019  . Diastolic CHF (HCC) 11/06/2019  . Acute respiratory failure with hypoxia (HCC)   . Acute kidney injury superimposed on CKD (HCC)   . Acute cholecystitis 08/31/2019  . RUQ pain   . Chronic obstructive pulmonary disease (HCC)   . Acute DVT (deep venous thrombosis) (HCC) 04/29/2018  . 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 AF (paroxysmal atrial fibrillation) (HCC)   . Chronic systolic CHF (congestive heart failure) (HCC)   . Osteoporosis, post-menopausal   . Osteoarthritis, multiple sites    4:17 PM, 02/19/20 04/20/20, SPT Student Physical Therapist [place]  [phone]  [phone] 02/19/2020, 4:16 PM  [facility] PHYSICAL AND SPORTS MEDICINE [address], [place] Phone: [phone]   Fax:  [phone]  Name: Donyelle Enyeart MRN: Genia Hotter Date of Birth: 27-Sep-1926

## 2020-02-26 ENCOUNTER — Other Ambulatory Visit: Payer: Self-pay

## 2020-02-26 ENCOUNTER — Ambulatory Visit: Payer: Medicare Other

## 2020-02-26 DIAGNOSIS — R262 Difficulty in walking, not elsewhere classified: Secondary | ICD-10-CM

## 2020-02-26 DIAGNOSIS — M6281 Muscle weakness (generalized): Secondary | ICD-10-CM

## 2020-02-26 DIAGNOSIS — M25551 Pain in right hip: Secondary | ICD-10-CM

## 2020-02-26 NOTE — Therapy (Signed)
[facility] PH[facility]TS MEDICINE [address] [place], [address] Phone: [phone]   Fax:  [phone]  Physical Therapy Treatment  Patient Details  Name: Diana Powell MRN: [id number] Date of Birth: 10-08-1926 Referring Provider (PT): Clarisa Kindred, FNP   Encounter Date: 02/26/2020   PT End of Session - 02/26/20 1522    Visit Number 10    Number of Visits 24    Date for PT Re-Evaluation 03/18/20    Authorization Type MCR    Authorization Time Period 12/25/19-03/18/20    Authorization - Visit Number 10    Authorization - Number of Visits 16    PT Start Time 1515    PT Stop Time 1600    PT Time Calculation (min) 45 min    Equipment Utilized During Treatment Gait belt    Activity Tolerance Patient limited by fatigue;Patient tolerated treatment well;Treatment limited secondary to medical complications (Comment)    Behavior During Therapy [facility] for tasks assessed/performed           Past Medical History:  Diagnosis Date  . Atrial fibrillation (HCC)   . CHF (congestive heart failure) (HCC)   . Chronic systolic heart failure (HCC)   . Complication of anesthesia    CONFUSION   . COPD (chronic obstructive pulmonary disease) (HCC)   . Osteoarthritis, multiple sites   . Osteoporosis, post-menopausal   . Presence of permanent cardiac pacemaker     Past Surgical History:  Procedure Laterality Date  . CARDIOVERSION  ~2009  . HIP FRACTURE SURGERY Bilateral [phone]   each hip fractured at separate times  . INSERT / REPLACE / REMOVE PACEMAKER    . IR EXCHANGE BILIARY DRAIN  10/05/2019  . TOTAL HIP ARTHROPLASTY Right 04/20/2018   Procedure: TOTAL HIP CONVERSION;  Surgeon: Kennedy Bucker, MD;  Location: [facility] ORS;  Service: Orthopedics;  Laterality: Right;    There were no vitals filed for this visit.   Subjective Assessment - 02/26/20 1521    Subjective Patient's son reports no major changes since the previous session. Patient's  son states his mom is having a "good" day today.    Patient is accompained by: Family member    Pertinent History ECHO 09/02/19: Moderate MVR, moderate TVR, mild AVR, EF: 45-50%. Recent Adjustments to apixaban and furosemide doses based on age/weight. Most recent Hb: 8.8 on iron supplementation, per PCP note down from baseline of 11.6 (09/14/19). Pt recently had a sleep study completed showing nocturnal hypoxia now on O2 over-night.    Limitations Lifting;Standing;Walking    How long can you sit comfortably? unlimited    How long can you stand comfortably? 15-20 minutes    How long can you walk comfortably? unknown, has been mostly performing household mobility since DC    Patient Stated Goals improved functional tolerance to AMB regarding breathing limitations    Currently in Pain? No/denies    Pain Onset More than a month ago                 INTERVENTION THIS DATE: Therapeutic Exercise -Cardiovascular conditioning for ABLA; Nustep 2 x 5 minutes LEVEL 2, Seat 7, Arms 6, SPM cued >55, vitals taken q20min. (SpO2:96% HR:70 bpm, (SpO2: 97% HR: 72 bpm) -Ambulation using FWW x475ft, x411ft (SpO2:95% HR:80 bpm; after ambulation)) -STS 2x13 without UE Support with towel in seat (SpO2: 95% HR: 127bpm) -Randomally stepping sideways/forward/background with patient reacting to PT's motion - ~15 steps - Step ups onto the stairs - 3  x 4 stairs   Performed Exercises to improve function capacity     PT Education - 02/26/20 1522    Education Details form/technique with exercise    Person(s) Educated Patient    Methods Explanation;Demonstration    Comprehension Verbalized understanding;Returned demonstration            PT Short Term Goals - 12/25/19 1957      PT SHORT TERM GOAL #1   Title After 4 weeks pt will tolerate 4 minutes sustained AMB c gait speed > 0.46m/s to improve capacity to perform safe household distance AMB.    Baseline At evaluation 12/25/19: 3 minute tolerance at 0.60m/s c RW     Time 4    Period Weeks    Status New    Target Date 01/22/20      PT SHORT TERM GOAL #2   Title After 4 weeks pt to demonstrate improved 5xSTS from 19" surface, hands free in <16sec.    Baseline At eval 12/25/19: 20sec from chair with hand push-off.    Time 4    Period Weeks    Status New    Target Date 01/22/20             PT Long Term Goals - 12/25/19 2000      PT LONG TERM GOAL #1   Title After 8 weeks pt to demonstrate ability to perform 5 continuous minutes AMB @ >0.65m/s c RW.    Baseline 366ft; 05/22/2019: 65ft; 06/19/2019: 541ft; 08/01/2019: at eval: 3 minutes only or 360ft.    Time 8    Period Weeks    Status New    Target Date 02/19/20      PT LONG TERM GOAL #2   Title After 8 weeks pt will demonstrate improve SLS >10sec BILAT at supervision level, no UE for recovery.    Baseline At eval 12/25/19: RLE ~3sec; LLE ~1sec;    Time 8    Period Weeks    Status New    Target Date 02/19/20      PT LONG TERM GOAL #3   Title At 12 weeks pt will tolerate 15 minutes moderate aerobic actiivty on recumbent stepper with supplemental O2 PRN to prepar for community based exercise compliance at DC.    Baseline Not performing    Time 12    Period Weeks    Status New    Target Date 03/18/20      PT LONG TERM GOAL #4   Title In 12 weeks patient will improve 5 x STS to under 12sec to decrease fall risk and improve LE functional strength.    Baseline 5xSTS: 27 sec; 05/22/2019: 20 sec; 06/19/2019: 20sec; 08/01/2019: Eval 12/25/19: 20sec    Time 12    Period Weeks    Status New    Target Date 03/18/20                 Plan - 02/26/20 1529    Clinical Impression Statement Continued to address limitations in endurance and balance during today's session. Patient demonstrates improvements, most noteably with performing endurance based exercises with ability to perform Nustep two minutes longer than the previous session. Although patient is improving, she continues to have  increased cardiovascular and balance limitations. Patient will benefit from further skilled therapy to return to prior level of function.    Personal Factors and Comorbidities Comorbidity 2;Past/Current Experience;Other    Comorbidities CHF, DVT, THA, CVA, "lung disorder"    Examination-Activity Limitations Carry;Lift;Locomotion Level;Stand;Stairs;Bend;Bathing;Dressing  Examination-Participation Restrictions Cleaning;Community Activity;Meal Prep    Stability/Clinical Decision Making Evolving/Moderate complexity    Rehab Potential Fair    Clinical Impairments Affecting Rehab Potential (+) highly motivated, family support (-) age, CHF, lack of opportunies for physical activity    PT Frequency 2x / week    PT Duration 12 weeks    PT Treatment/Interventions Electrical Stimulation;Iontophoresis 4mg /ml Dexamethasone;Aquatic Therapy;Cryotherapy;Moist Heat;Therapeutic activities;Patient/family education;Manual techniques;Balance training;Stair training;Gait training;Neuromuscular re-education;Therapeutic exercise;Passive range of motion    PT Next Visit Plan establish HEP    PT Home Exercise Plan not yet established    Consulted and Agree with Plan of Care Patient;Family member/caregiver    Family Member Consulted Son           Patient will benefit from skilled therapeutic intervention in order to improve the following deficits and impairments:  Abnormal gait, Pain, Decreased coordination, Decreased mobility, Increased muscle spasms, Decreased endurance, Decreased range of motion, Decreased strength, Decreased balance, Difficulty walking, Decreased activity tolerance, Cardiopulmonary status limiting activity, Dizziness  Visit Diagnosis: Muscle weakness (generalized)  Pain in right hip  Difficulty in walking, not elsewhere classified     Problem List Patient Active Problem List   Diagnosis Date Noted  . Malnutrition of moderate degree 11/08/2019  . COPD with acute exacerbation (HCC)  11/06/2019  . S/P laparoscopic cholecystectomy 10/30/19 11/06/2019  . CHF (congestive heart failure) (HCC) 11/06/2019  . CKD (chronic kidney disease) stage 3, GFR 30-59 ml/min 11/06/2019  . Acute on chronic systolic CHF (congestive heart failure) (HCC) 11/06/2019  . Diastolic CHF (HCC) 11/06/2019  . Acute respiratory failure with hypoxia (HCC)   . Acute kidney injury superimposed on CKD (HCC)   . Acute cholecystitis 08/31/2019  . RUQ pain   . Chronic obstructive pulmonary disease (HCC)   . Acute DVT (deep venous thrombosis) (HCC) 04/29/2018  . 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 AF (paroxysmal atrial fibrillation) (HCC)   . Chronic systolic CHF (congestive heart failure) (HCC)   . Osteoporosis, post-menopausal   . Osteoarthritis, multiple sites     05/06/2013, PT DPT 02/26/2020, 4:00 PM  [place] [facility] PH[facility]TS [facility]ICINE [address], [place] Phone: [phone]   Fax:  [phone]  Name: Diana Powell MRN: Genia Hotter Date of Birth: 1926/10/06

## 2020-03-04 ENCOUNTER — Other Ambulatory Visit: Payer: Self-pay

## 2020-03-04 ENCOUNTER — Ambulatory Visit: Payer: Medicare Other

## 2020-03-04 DIAGNOSIS — R262 Difficulty in walking, not elsewhere classified: Secondary | ICD-10-CM

## 2020-03-04 DIAGNOSIS — M6281 Muscle weakness (generalized): Secondary | ICD-10-CM

## 2020-03-04 DIAGNOSIS — M25551 Pain in right hip: Secondary | ICD-10-CM

## 2020-03-05 NOTE — Therapy (Signed)
[facility] [address] [place], [address] Phone: [phone]   Fax:  [phone]  Physical Therapy Treatment  Patient Details  Name: Diana Powell MRN: [id number] Date of Birth: 09/25/1926 Referring Provider (PT): Clarisa Kindred, FNP   Encounter Date: 03/04/2020   PT End of Session - 03/04/20 1540    Visit Number 11    Number of Visits 24    Date for PT Re-Evaluation 03/18/20    Authorization Type MCR    Authorization Time Period 12/25/19-03/18/20    Authorization - Visit Number 11    Authorization - Number of Visits 16    PT Start Time 1515    PT Stop Time 1600    PT Time Calculation (min) 45 min    Equipment Utilized During Treatment Gait belt    Activity Tolerance Patient limited by fatigue;Patient tolerated treatment well;Treatment limited secondary to medical complications (Comment)    Behavior During Therapy [facility] for tasks assessed/performed           Past Medical History:  Diagnosis Date  . Atrial fibrillation (HCC)   . CHF (congestive heart failure) (HCC)   . Chronic systolic heart failure (HCC)   . Complication of anesthesia    CONFUSION   . COPD (chronic obstructive pulmonary disease) (HCC)   . Osteoarthritis, multiple sites   . Osteoporosis, post-menopausal   . Presence of permanent cardiac pacemaker     Past Surgical History:  Procedure Laterality Date  . CARDIOVERSION  ~2009  . HIP FRACTURE SURGERY Bilateral [phone]   each hip fractured at separate times  . INSERT / REPLACE / REMOVE PACEMAKER    . IR EXCHANGE BILIARY DRAIN  10/05/2019  . TOTAL HIP ARTHROPLASTY Right 04/20/2018   Procedure: TOTAL HIP CONVERSION;  Surgeon: Kennedy Bucker, MD;  Location: [facility] ORS;  Service: Orthopedics;  Laterality: Right;    There were no vitals filed for this visit.   Subjective Assessment - 03/04/20 1531    Subjective Patient reports she is feeling a little tired through her son translation.  States increased breathlessness today.    Patient is accompained by: Family member    Pertinent History ECHO 09/02/19: Moderate MVR, moderate TVR, mild AVR, EF: 45-50%. Recent Adjustments to apixaban and furosemide doses based on age/weight. Most recent Hb: 8.8 on iron supplementation, per PCP note down from baseline of 11.6 (09/14/19). Pt recently had a sleep study completed showing nocturnal hypoxia now on O2 over-night.    Limitations Lifting;Standing;Walking    How long can you sit comfortably? unlimited    How long can you stand comfortably? 15-20 minutes    How long can you walk comfortably? unknown, has been mostly performing household mobility since DC    Patient Stated Goals improved functional tolerance to AMB regarding breathing limitations    Currently in Pain? No/denies    Pain Onset More than a month ago               INTERVENTION THIS DATE: Therapeutic Exercise -Cardiovascular conditioning for ABLA; Nustep x 6, x4 min minutes LEVEL 3, Seat 7, Arms 6, SPM cued >55, vitals taken q86min. (SpO2:96% HR:74 bpm, (SpO2: 97% HR: 72 bpm) -Ambulation using FWW x454ft, x49ft (SpO2:95% HR:80 bpm; after ambulation)) -STS 2x11 without UE Support with towel in seat (SpO2: 95% HR: 129bpm) -Randomly stepping sideways/forward/background with patient reacting to PT's motion - ~15 steps  Performed Exercises to improve function capacity     PT Education -  03/04/20 1539    Education Details form/technique with exercise    Person(s) Educated Patient    Methods Explanation;Demonstration    Comprehension Verbalized understanding;Returned demonstration            PT Short Term Goals - 12/25/19 1957      PT SHORT TERM GOAL #1   Title After 4 weeks pt will tolerate 4 minutes sustained AMB c gait speed > 0.20m/s to improve capacity to perform safe household distance AMB.    Baseline At evaluation 12/25/19: 3 minute tolerance at 0.41m/s c RW    Time 4    Period Weeks    Status New     Target Date 01/22/20      PT SHORT TERM GOAL #2   Title After 4 weeks pt to demonstrate improved 5xSTS from 19" surface, hands free in <16sec.    Baseline At eval 12/25/19: 20sec from chair with hand push-off.    Time 4    Period Weeks    Status New    Target Date 01/22/20             PT Long Term Goals - 12/25/19 2000      PT LONG TERM GOAL #1   Title After 8 weeks pt to demonstrate ability to perform 5 continuous minutes AMB @ >0.80m/s c RW.    Baseline 350ft; 05/22/2019: 665ft; 06/19/2019: 550ft; 08/01/2019: at eval: 3 minutes only or 335ft.    Time 8    Period Weeks    Status New    Target Date 02/19/20      PT LONG TERM GOAL #2   Title After 8 weeks pt will demonstrate improve SLS >10sec BILAT at supervision level, no UE for recovery.    Baseline At eval 12/25/19: RLE ~3sec; LLE ~1sec;    Time 8    Period Weeks    Status New    Target Date 02/19/20      PT LONG TERM GOAL #3   Title At 12 weeks pt will tolerate 15 minutes moderate aerobic actiivty on recumbent stepper with supplemental O2 PRN to prepar for community based exercise compliance at DC.    Baseline Not performing    Time 12    Period Weeks    Status New    Target Date 03/18/20      PT LONG TERM GOAL #4   Title In 12 weeks patient will improve 5 x STS to under 12sec to decrease fall risk and improve LE functional strength.    Baseline 5xSTS: 27 sec; 05/22/2019: 20 sec; 06/19/2019: 20sec; 08/01/2019: Eval 12/25/19: 20sec    Time 12    Period Weeks    Status New    Target Date 03/18/20                 Plan - 03/04/20 1720    Clinical Impression Statement SpO2 WNL throughout today's session, continued to focus on improving cardiovascular endurance, power and strength throughout the session. Continued to focus on improving these limitations to improvement ambulation ability, although this is improving. Patient will beenfit from further skilled therapy to return to prior level of function.    Personal  Factors and Comorbidities Comorbidity 2;Past/Current Experience;Other    Comorbidities CHF, DVT, THA, CVA, "lung disorder"    Examination-Activity Limitations Carry;Lift;Locomotion Level;Stand;Stairs;Bend;Bathing;Dressing    Examination-Participation Restrictions Cleaning;Community Activity;Meal Prep    Stability/Clinical Decision Making Evolving/Moderate complexity    Rehab Potential Fair    Clinical Impairments Affecting Rehab Potential (+)  highly motivated, family support (-) age, CHF, lack of opportunies for physical activity    PT Frequency 2x / week    PT Duration 12 weeks    PT Treatment/Interventions Electrical Stimulation;Iontophoresis 4mg /ml Dexamethasone;Aquatic Therapy;Cryotherapy;Moist Heat;Therapeutic activities;Patient/family education;Manual techniques;Balance training;Stair training;Gait training;Neuromuscular re-education;Therapeutic exercise;Passive range of motion    PT Next Visit Plan establish HEP    PT Home Exercise Plan not yet established    Consulted and Agree with Plan of Care Patient;Family member/caregiver    Family Member Consulted Son           Patient will benefit from skilled therapeutic intervention in order to improve the following deficits and impairments:  Abnormal gait, Pain, Decreased coordination, Decreased mobility, Increased muscle spasms, Decreased endurance, Decreased range of motion, Decreased strength, Decreased balance, Difficulty walking, Decreased activity tolerance, Cardiopulmonary status limiting activity, Dizziness  Visit Diagnosis: Muscle weakness (generalized)  Pain in right hip  Difficulty in walking, not elsewhere classified     Problem List Patient Active Problem List   Diagnosis Date Noted  . Malnutrition of moderate degree 11/08/2019  . COPD with acute exacerbation (HCC) 11/06/2019  . S/P laparoscopic cholecystectomy 10/30/19 11/06/2019  . CHF (congestive heart failure) (HCC) 11/06/2019  . CKD (chronic kidney disease)  stage 3, GFR 30-59 ml/min 11/06/2019  . Acute on chronic systolic CHF (congestive heart failure) (HCC) 11/06/2019  . Diastolic CHF (HCC) 11/06/2019  . Acute respiratory failure with hypoxia (HCC)   . Acute kidney injury superimposed on CKD (HCC)   . Acute cholecystitis 08/31/2019  . RUQ pain   . Chronic obstructive pulmonary disease (HCC)   . Acute DVT (deep venous thrombosis) (HCC) 04/29/2018  . 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 AF (paroxysmal atrial fibrillation) (HCC)   . Chronic systolic CHF (congestive heart failure) (HCC)   . Osteoporosis, post-menopausal   . Osteoarthritis, multiple sites     05/06/2013, PT DPT 03/05/2020, 3:23 PM  [facility] [address], [place] Phone: [phone]   Fax:  [phone]  Name: Diana Powell MRN: Genia Hotter Date of Birth: [birth year]

## 2020-03-11 ENCOUNTER — Other Ambulatory Visit: Payer: Self-pay

## 2020-03-11 ENCOUNTER — Ambulatory Visit: Payer: Medicare Other

## 2020-03-11 DIAGNOSIS — R262 Difficulty in walking, not elsewhere classified: Secondary | ICD-10-CM

## 2020-03-11 DIAGNOSIS — M6281 Muscle weakness (generalized): Secondary | ICD-10-CM

## 2020-03-11 DIAGNOSIS — M25551 Pain in right hip: Secondary | ICD-10-CM

## 2020-03-11 NOTE — Therapy (Signed)
[facility] [address] [place], [address] Phone: [phone]   Fax:  [phone]  Physical Therapy Treatment  Patient Details  Name: Diana Powell MRN: [id number] Date of Birth: [birth year] Referring Provider (PT): Clarisa Kindred, FNP   Encounter Date: 03/11/2020   PT End of Session - 03/11/20 1521    Visit Number 12    Number of Visits 24    Date for PT Re-Evaluation 03/18/20    Authorization Type MCR    Authorization Time Period 12/25/19-03/18/20    Authorization - Visit Number 12    Authorization - Number of Visits 16    PT Start Time 1515    PT Stop Time 1600    PT Time Calculation (min) 45 min    Equipment Utilized During Treatment Gait belt    Activity Tolerance Patient limited by fatigue;Patient tolerated treatment well;Treatment limited secondary to medical complications (Comment)    Behavior During Therapy [facility] for tasks assessed/performed           Past Medical History:  Diagnosis Date  . Atrial fibrillation (HCC)   . CHF (congestive heart failure) (HCC)   . Chronic systolic heart failure (HCC)   . Complication of anesthesia    CONFUSION   . COPD (chronic obstructive pulmonary disease) (HCC)   . Osteoarthritis, multiple sites   . Osteoporosis, post-menopausal   . Presence of permanent cardiac pacemaker     Past Surgical History:  Procedure Laterality Date  . CARDIOVERSION  ~2009  . HIP FRACTURE SURGERY Bilateral [phone]   each hip fractured at separate times  . INSERT / REPLACE / REMOVE PACEMAKER    . IR EXCHANGE BILIARY DRAIN  10/05/2019  . TOTAL HIP ARTHROPLASTY Right 04/20/2018   Procedure: TOTAL HIP CONVERSION;  Surgeon: Kennedy Bucker, MD;  Location: [facility] ORS;  Service: Orthopedics;  Laterality: Right;    There were no vitals filed for this visit.   Subjective Assessment - 03/11/20 1520    Subjective Patient reports shes "feeling ok" through translation from her son.     Patient is accompained by: Family member    Pertinent History ECHO 09/02/19: Moderate MVR, moderate TVR, mild AVR, EF: 45-50%. Recent Adjustments to apixaban and furosemide doses based on age/weight. Most recent Hb: 8.8 on iron supplementation, per PCP note down from baseline of 11.6 (09/14/19). Pt recently had a sleep study completed showing nocturnal hypoxia now on O2 over-night.    Limitations Lifting;Standing;Walking    How long can you sit comfortably? unlimited    How long can you stand comfortably? 15-20 minutes    How long can you walk comfortably? unknown, has been mostly performing household mobility since DC    Patient Stated Goals improved functional tolerance to AMB regarding breathing limitations    Currently in Pain? No/denies    Pain Onset More than a month ago           INTERVENTION Therapeutic Exercise -Cardiovascular conditioning for ABLA; Nustep 2x5 min minutes LEVEL 3, Seat 7, Arms 6, SPM cued >55, vitals taken q16min. (SpO2:94% HR:72 bpm, (SpO2: 95% HR: 71 bpm) -Ambulation using FWW x468ft, x556ft (SpO2:94% HR:75 bpm; after ambulation)) -STS 2x11 without UE Support with towel in seat (SpO2: 95% HR: 123bpm) -Randomly stepping sideways/forward/back with patient reacting to PT's motion - ~15 steps (SpO2:94% HR:84 bpm)   Performed Exercises to improve function capacity    PT Education - 03/11/20 1520    Education Details form/technique with exercise  Person(s) Educated Patient    Methods Explanation;Demonstration    Comprehension Verbalized understanding;Returned demonstration            PT Short Term Goals - 12/25/19 1957      PT SHORT TERM GOAL #1   Title After 4 weeks pt will tolerate 4 minutes sustained AMB c gait speed > 0.29m/s to improve capacity to perform safe household distance AMB.    Baseline At evaluation 12/25/19: 3 minute tolerance at 0.108m/s c RW    Time 4    Period Weeks    Status New    Target Date 01/22/20      PT SHORT TERM GOAL #2    Title After 4 weeks pt to demonstrate improved 5xSTS from 19" surface, hands free in <16sec.    Baseline At eval 12/25/19: 20sec from chair with hand push-off.    Time 4    Period Weeks    Status New    Target Date 01/22/20             PT Long Term Goals - 12/25/19 2000      PT LONG TERM GOAL #1   Title After 8 weeks pt to demonstrate ability to perform 5 continuous minutes AMB @ >0.43m/s c RW.    Baseline 337ft; 05/22/2019: 642ft; 06/19/2019: 525ft; 08/01/2019: at eval: 3 minutes only or 362ft.    Time 8    Period Weeks    Status New    Target Date 02/19/20      PT LONG TERM GOAL #2   Title After 8 weeks pt will demonstrate improve SLS >10sec BILAT at supervision level, no UE for recovery.    Baseline At eval 12/25/19: RLE ~3sec; LLE ~1sec;    Time 8    Period Weeks    Status New    Target Date 02/19/20      PT LONG TERM GOAL #3   Title At 12 weeks pt will tolerate 15 minutes moderate aerobic actiivty on recumbent stepper with supplemental O2 PRN to prepar for community based exercise compliance at DC.    Baseline Not performing    Time 12    Period Weeks    Status New    Target Date 03/18/20      PT LONG TERM GOAL #4   Title In 12 weeks patient will improve 5 x STS to under 12sec to decrease fall risk and improve LE functional strength.    Baseline 5xSTS: 27 sec; 05/22/2019: 20 sec; 06/19/2019: 20sec; 08/01/2019: Eval 12/25/19: 20sec    Time 12    Period Weeks    Status New    Target Date 03/18/20                 Plan - 03/11/20 1528    Clinical Impression Statement Continued to focus on improving cardiovascular and muscular endurance along with strength during today's session.  Patient is improving but is still limited in independent community ambulation along with leisurely activities due to fatigue and weakness. Patient will continue to benefit from skilled therapeutic to progress towards goals and return to prior level of function.    Personal Factors and  Comorbidities Comorbidity 2;Past/Current Experience;Other    Comorbidities CHF, DVT, THA, CVA, "lung disorder"    Examination-Activity Limitations Carry;Lift;Locomotion Level;Stand;Stairs;Bend;Bathing;Dressing    Examination-Participation Restrictions Cleaning;Community Activity;Meal Prep    Stability/Clinical Decision Making Evolving/Moderate complexity    Rehab Potential Fair    Clinical Impairments Affecting Rehab Potential (+) highly motivated, family support (-)  age, CHF, lack of opportunies for physical activity    PT Frequency 2x / week    PT Duration 12 weeks    PT Treatment/Interventions Electrical Stimulation;Iontophoresis 4mg /ml Dexamethasone;Aquatic Therapy;Cryotherapy;Moist Heat;Therapeutic activities;Patient/family education;Manual techniques;Balance training;Stair training;Gait training;Neuromuscular re-education;Therapeutic exercise;Passive range of motion    PT Next Visit Plan establish HEP    PT Home Exercise Plan not yet established    Consulted and Agree with Plan of Care Patient;Family member/caregiver    Family Member Consulted Son           Patient will benefit from skilled therapeutic intervention in order to improve the following deficits and impairments:  Abnormal gait, Pain, Decreased coordination, Decreased mobility, Increased muscle spasms, Decreased endurance, Decreased range of motion, Decreased strength, Decreased balance, Difficulty walking, Decreased activity tolerance, Cardiopulmonary status limiting activity, Dizziness  Visit Diagnosis: Muscle weakness (generalized)  Pain in right hip  Difficulty in walking, not elsewhere classified     Problem List Patient Active Problem List   Diagnosis Date Noted  . Malnutrition of moderate degree 11/08/2019  . COPD with acute exacerbation (HCC) 11/06/2019  . S/P laparoscopic cholecystectomy 10/30/19 11/06/2019  . CHF (congestive heart failure) (HCC) 11/06/2019  . CKD (chronic kidney disease) stage 3, GFR  30-59 ml/min 11/06/2019  . Acute on chronic systolic CHF (congestive heart failure) (HCC) 11/06/2019  . Diastolic CHF (HCC) 11/06/2019  . Acute respiratory failure with hypoxia (HCC)   . Acute kidney injury superimposed on CKD (HCC)   . Acute cholecystitis 08/31/2019  . RUQ pain   . Chronic obstructive pulmonary disease (HCC)   . Acute DVT (deep venous thrombosis) (HCC) 04/29/2018  . 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 AF (paroxysmal atrial fibrillation) (HCC)   . Chronic systolic CHF (congestive heart failure) (HCC)   . Osteoporosis, post-menopausal   . Osteoarthritis, multiple sites    4:00 PM, 03/11/20 03/13/20, [facility] Student Physical Therapist [place]  [phone]  [phone] 03/11/2020, 3:52 PM  [facility] SPORTS MEDICINE [address], [address], [place] Phone: [phone]   Fax:  [phone]  Name: Diana Powell MRN: Genia Hotter Date of Birth: 06/29/1927

## 2020-03-18 ENCOUNTER — Other Ambulatory Visit: Payer: Self-pay

## 2020-03-18 ENCOUNTER — Ambulatory Visit: Payer: Medicare Other

## 2020-03-18 DIAGNOSIS — M6281 Muscle weakness (generalized): Secondary | ICD-10-CM | POA: Diagnosis not present

## 2020-03-18 DIAGNOSIS — R262 Difficulty in walking, not elsewhere classified: Secondary | ICD-10-CM

## 2020-03-18 DIAGNOSIS — M25551 Pain in right hip: Secondary | ICD-10-CM

## 2020-03-18 NOTE — Therapy (Signed)
[place] [facility] PHYS[facility] AND SPORTS MEDICINE [address] [place], [address] Phone: [phone]   Fax:  [phone]  Physical Therapy Treatment  Patient Details  Name: Diana Powell MRN: [id number] Date of Birth: January 26, 1927 Referring Provider (PT): Clarisa Kindred, FNP   Encounter Date: 03/18/2020   PT End of Session - 03/18/20 1518    Visit Number 13    Number of Visits 24    Date for PT Re-Evaluation 03/18/20    Authorization Type MCR    Authorization Time Period 12/25/19-03/18/20    Authorization - Visit Number 13    Authorization - Number of Visits 16    PT Start Time 1515    PT Stop Time 1600    PT Time Calculation (min) 45 min    Equipment Utilized During Treatment Gait belt    Activity Tolerance Patient limited by fatigue;Patient tolerated treatment well;Treatment limited secondary to medical complications (Comment)    Behavior During Therapy [facility] for tasks assessed/performed           Past Medical History:  Diagnosis Date  . Atrial fibrillation (HCC)   . CHF (congestive heart failure) (HCC)   . Chronic systolic heart failure (HCC)   . Complication of anesthesia    CONFUSION   . COPD (chronic obstructive pulmonary disease) (HCC)   . Osteoarthritis, multiple sites   . Osteoporosis, post-menopausal   . Presence of permanent cardiac pacemaker     Past Surgical History:  Procedure Laterality Date  . CARDIOVERSION  ~2009  . HIP FRACTURE SURGERY Bilateral [phone]   each hip fractured at separate times  . INSERT / REPLACE / REMOVE PACEMAKER    . IR EXCHANGE BILIARY DRAIN  10/05/2019  . TOTAL HIP ARTHROPLASTY Right 04/20/2018   Procedure: TOTAL HIP CONVERSION;  Surgeon: Kennedy Bucker, MD;  Location: [facility] ORS;  Service: Orthopedics;  Laterality: Right;    There were no vitals filed for this visit.   Subjective Assessment - 03/18/20 1515    Subjective Patients son reports she is doing ok at today's session.    Patient is  accompained by: Family member    Pertinent History ECHO 09/02/19: Moderate MVR, moderate TVR, mild AVR, EF: 45-50%. Recent Adjustments to apixaban and furosemide doses based on age/weight. Most recent Hb: 8.8 on iron supplementation, per PCP note down from baseline of 11.6 (09/14/19). Pt recently had a sleep study completed showing nocturnal hypoxia now on O2 over-night.    Limitations Lifting;Standing;Walking    How long can you sit comfortably? unlimited    How long can you stand comfortably? 15-20 minutes    How long can you walk comfortably? unknown, has been mostly performing household mobility since DC    Patient Stated Goals improved functional tolerance to AMB regarding breathing limitations    Currently in Pain? No/denies    Pain Onset More than a month ago           INTERVENTION  Therapeutic Exercise -Cardiovascular conditioning for ABLA; Nustep 2x5 min minutes LEVEL 3, Seat 7, Arms 6, SPM cued >55, vitals taken q79min. (SpO2:94% HR:70 bpm, (SpO2: 95% HR: 71 bpm) -Ambulation using FWW x466ft, x569ft (SpO2:93% HR:84 bpm; after ambulation)) -STS 2x10 without UE Support with towel in seat (SpO2: 96% HR: 121bpm) -Stepping over 4inch obstacle forward/backward/laterally - x5 each  (SpO2:95% HR:87 bpm) -Ascending/descending stairs x5 with step-through pattern    Performed Exercises to improve function capacity     PT Education - 03/18/20 1518  Education Details form/technique with exercise    Person(s) Educated Patient    Methods Explanation;Demonstration    Comprehension Verbalized understanding;Returned demonstration            PT Short Term Goals - 12/25/19 1957      PT SHORT TERM GOAL #1   Title After 4 weeks pt will tolerate 4 minutes sustained AMB c gait speed > 0.1m/s to improve capacity to perform safe household distance AMB.    Baseline At evaluation 12/25/19: 3 minute tolerance at 0.13m/s c RW    Time 4    Period Weeks    Status New    Target Date 01/22/20       PT SHORT TERM GOAL #2   Title After 4 weeks pt to demonstrate improved 5xSTS from 19" surface, hands free in <16sec.    Baseline At eval 12/25/19: 20sec from chair with hand push-off.    Time 4    Period Weeks    Status New    Target Date 01/22/20             PT Long Term Goals - 12/25/19 2000      PT LONG TERM GOAL #1   Title After 8 weeks pt to demonstrate ability to perform 5 continuous minutes AMB @ >0.29m/s c RW.    Baseline 377ft; 05/22/2019: 673ft; 06/19/2019: 581ft; 08/01/2019: at eval: 3 minutes only or 371ft.    Time 8    Period Weeks    Status New    Target Date 02/19/20      PT LONG TERM GOAL #2   Title After 8 weeks pt will demonstrate improve SLS >10sec BILAT at supervision level, no UE for recovery.    Baseline At eval 12/25/19: RLE ~3sec; LLE ~1sec;    Time 8    Period Weeks    Status New    Target Date 02/19/20      PT LONG TERM GOAL #3   Title At 12 weeks pt will tolerate 15 minutes moderate aerobic actiivty on recumbent stepper with supplemental O2 PRN to prepar for community based exercise compliance at DC.    Baseline Not performing    Time 12    Period Weeks    Status New    Target Date 03/18/20      PT LONG TERM GOAL #4   Title In 12 weeks patient will improve 5 x STS to under 12sec to decrease fall risk and improve LE functional strength.    Baseline 5xSTS: 27 sec; 05/22/2019: 20 sec; 06/19/2019: 20sec; 08/01/2019: Eval 12/25/19: 20sec    Time 12    Period Weeks    Status New    Target Date 03/18/20                 Plan - 03/18/20 1545    Clinical Impression Statement Focused on improving patient's muscular endurance along with cardiovascular endurance during the session.  Patient tolerated exercises well with moderate muscular fatigue and SOB, however, SpO2 levels remained appropriate for exercise.  Will continue to focus on improving these limitations in future sessions. Patient will benefit from skilled therapy to progress towards goals  and return to prior level of function.    Personal Factors and Comorbidities Comorbidity 2;Past/Current Experience;Other    Comorbidities CHF, DVT, THA, CVA, "lung disorder"    Examination-Activity Limitations Carry;Lift;Locomotion Level;Stand;Stairs;Bend;Bathing;Dressing    Examination-Participation Restrictions Cleaning;Community Activity;Meal Prep    Stability/Clinical Decision Making Evolving/Moderate complexity    Clinical Decision Making Moderate  Rehab Potential Fair    Clinical Impairments Affecting Rehab Potential (+) highly motivated, family support (-) age, CHF, lack of opportunies for physical activity    PT Frequency 2x / week    PT Duration 12 weeks    PT Treatment/Interventions Electrical Stimulation;Iontophoresis 4mg /ml Dexamethasone;Aquatic Therapy;Cryotherapy;Moist Heat;Therapeutic activities;Patient/family education;Manual techniques;Balance training;Stair training;Gait training;Neuromuscular re-education;Therapeutic exercise;Passive range of motion    PT Next Visit Plan establish HEP    PT Home Exercise Plan not yet established    Consulted and Agree with Plan of Care Patient;Family member/caregiver    Family Member Consulted Son           Patient will benefit from skilled therapeutic intervention in order to improve the following deficits and impairments:  Abnormal gait, Pain, Decreased coordination, Decreased mobility, Increased muscle spasms, Decreased endurance, Decreased range of motion, Decreased strength, Decreased balance, Difficulty walking, Decreased activity tolerance, Cardiopulmonary status limiting activity, Dizziness  Visit Diagnosis: Muscle weakness (generalized)  Pain in right hip  Difficulty in walking, not elsewhere classified     Problem List Patient Active Problem List   Diagnosis Date Noted  . Malnutrition of moderate degree 11/08/2019  . COPD with acute exacerbation (HCC) 11/06/2019  . S/P laparoscopic cholecystectomy 10/30/19  11/06/2019  . CHF (congestive heart failure) (HCC) 11/06/2019  . CKD (chronic kidney disease) stage 3, GFR 30-59 ml/min 11/06/2019  . Acute on chronic systolic CHF (congestive heart failure) (HCC) 11/06/2019  . Diastolic CHF (HCC) 11/06/2019  . Acute respiratory failure with hypoxia (HCC)   . Acute kidney injury superimposed on CKD (HCC)   . Acute cholecystitis 08/31/2019  . RUQ pain   . Chronic obstructive pulmonary disease (HCC)   . Acute DVT (deep venous thrombosis) (HCC) 04/29/2018  . 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 AF (paroxysmal atrial fibrillation) (HCC)   . Chronic systolic CHF (congestive heart failure) (HCC)   . Osteoporosis, post-menopausal   . Osteoarthritis, multiple sites    4:00 PM, 03/18/20 03/20/20, [facility] Student Physical Therapist [facility]  [phone]  [phone] 03/18/2020, 3:45 PM  [facility] PHYSICAL AND SPORTS MEDICINE [address], [place] Phone: [phone]   Fax:  [phone]  Name: Diana Powell MRN: [id number] Date of Birth: 07-May-1927

## 2020-03-25 ENCOUNTER — Ambulatory Visit: Payer: Medicare Other | Attending: Geriatric Medicine

## 2020-03-25 ENCOUNTER — Other Ambulatory Visit: Payer: Self-pay

## 2020-03-25 DIAGNOSIS — M25551 Pain in right hip: Secondary | ICD-10-CM | POA: Insufficient documentation

## 2020-03-25 DIAGNOSIS — R262 Difficulty in walking, not elsewhere classified: Secondary | ICD-10-CM | POA: Insufficient documentation

## 2020-03-25 DIAGNOSIS — M6281 Muscle weakness (generalized): Secondary | ICD-10-CM | POA: Diagnosis present

## 2020-03-25 NOTE — Therapy (Addendum)
[place] [facility]TS [facility]IC[facility] [address] [place], [place], [address] Phone: [phone]   Fax:  [phone]  Physical Therapy Treatment/Progress Note   Patient Details  Name: Diana Powell MRN: [id number] Date of Birth: July 03, 1927 Referring Provider (PT): Darylene Price, FNP  Reporting Period: 12/25/2019 - 03/25/2020 Encounter Date: 03/25/2020   PT End of Session - 03/25/20 1615    Visit Number 14    Number of Visits 24    Date for PT Re-Evaluation 03/18/20    Authorization Type MCR    Authorization Time Period 12/25/19-03/18/20    Authorization - Visit Number 14    Authorization - Number of Visits 16    PT Start Time 2947    PT Stop Time 1700    PT Time Calculation (min) 45 min    Equipment Utilized During Treatment Gait belt    Activity Tolerance Patient limited by fatigue;Patient tolerated treatment well;Treatment limited secondary to medical complications (Comment)    Behavior During Therapy WFL for tasks assessed/performed           Past Medical History:  Diagnosis Date   Atrial fibrillation (HCC)    CHF (congestive heart failure) (HCC)    Chronic systolic heart failure (HCC)    Complication of anesthesia    CONFUSION    COPD (chronic obstructive pulmonary disease) ([place])    Osteoarthritis, multiple sites    Osteoporosis, post-menopausal    Presence of permanent cardiac pacemaker     Past Surgical History:  Procedure Laterality Date   CARDIOVERSION  ~2009   HIP FRACTURE SURGERY Bilateral [phone]   each hip fractured at separate times   INSERT / REPLACE / REMOVE PACEMAKER     IR [place]  10/05/2019   TOTAL HIP ARTHROPLASTY Right 04/20/2018   Procedure: TOTAL HIP CONVERSION;  Surgeon: Hessie Knows, MD;  Location: [facility] ORS;  Service: Orthopedics;  Laterality: Right;    There were no vitals filed for this visit.   Subjective Assessment - 03/25/20 1614    Subjective Patients son reports  she is doing great today and no other issues to report.    Patient is accompained by: Family member    Pertinent History ECHO 09/02/19: Moderate MVR, moderate TVR, mild AVR, EF: 45-50%. Recent Adjustments to apixaban and furosemide doses based on age/weight. Most recent Hb: 8.8 on iron supplementation, per PCP note down from baseline of 11.6 (09/14/19). Pt recently had a sleep study completed showing nocturnal hypoxia now on O2 over-night.    Limitations Lifting;Standing;Walking    How long can you sit comfortably? unlimited    How long can you stand comfortably? 15-20 minutes    How long can you walk comfortably? unknown, has been mostly performing household mobility since DC    Patient Stated Goals improved functional tolerance to AMB regarding breathing limitations    Currently in Pain? No/denies    Pain Onset More than a month ago          INTERVENTION   Therapeutic Exercise -Cardiovascular conditioning for ABLA; Nustep 2x5 min minutes LEVEL 3, Seat 7, Arms 6, SPM cued >55, vitals taken q45mn. (SpO2:94% HR:70 bpm, (SpO2: 95% HR: 71 bpm) -5xSTS 13.5 sec  -SLS B LE ~5sec -5 min ambulation 4122f -Ambulation using FWW [id number]:93% HR:84 bpm; after ambulation)) -Lateral Step 2x10 UE support by therapist (SpO2: 94% HR: 86bpm) -Forward/back 2x10 stepping UE Support by therapist (SpO2: 93% HR: 87bpm) -Ascending/descending stairs x5 with step-through pattern  Performed Exercises to improve function capacity          PT Education - 03/25/20 1614    Education Details form/technique with exercises    Person(s) Educated Patient    Methods Explanation;Demonstration    Comprehension Verbalized understanding;Returned demonstration            PT Short Term Goals - 12/25/19 1957      PT SHORT TERM GOAL #1   Title After 4 weeks pt will tolerate 4 minutes sustained AMB c gait speed > 0.72ms to improve capacity to perform safe household distance AMB.    Baseline At  evaluation 12/25/19: 3 minute tolerance at 0.680m c RW    Time 4    Period Weeks    Status New    Target Date 01/22/20      PT SHORT TERM GOAL #2   Title After 4 weeks pt to demonstrate improved 5xSTS from 19" surface, hands free in <16sec.    Baseline At eval 12/25/19: 20sec from chair with hand push-off.    Time 4    Period Weeks    Status New    Target Date 01/22/20             PT Long Term Goals - 03/25/20 1616      PT LONG TERM GOAL #1   Title After 8 weeks pt to demonstrate ability to perform 5 continuous minutes AMB @ >0.6061mc RW. (590f77f 5min58ms)    Baseline 375ft;5f3/2020: 600ft; 30f1/2020: 500ft; 019f/2021: at eval: 3 minutes only or 300ft.; 030f/21: 416 in 5 minutes    Time 8    Period Weeks    Status Partially Met      PT LONG TERM GOAL #2   Title After 8 weeks pt will demonstrate improve SLS >10sec BILAT at supervision level, no UE for recovery.    Baseline At eval 12/25/19: RLE ~3sec; LLE ~1sec; 03/25/20: RLE ~5sec  LLE ~5sec    Time 8    Period Weeks    Status New      PT LONG TERM GOAL #3   Title At 12 weeks pt will tolerate 15 minutes moderate aerobic actiivty on recumbent stepper with supplemental O2 PRN to prepar for community based exercise compliance at DC.    Baseline Not performing; 03/25/20: 10 minutes    Time 12    Period Weeks    Status On-going      PT LONG TERM GOAL #4   Title In 12 weeks patient will improve 5 x STS to under 12sec to decrease fall risk and improve LE functional strength.    Baseline 5xSTS: 27 sec; 05/22/2019: 20 sec; 06/19/2019: 20sec; 08/01/2019: Eval 12/25/19: 20sec; 03/25/20: 13.5 sec    Time 12    Period Weeks    Status New                 Plan - 03/25/20 1653    Clinical Impression Statement Patient is progressing towards goals which is demonstrated by patient improving her ability to tolerate an increased time on recumbent bike by 5 minutes, being able to walk 500ft befo30feeding to rest, increased SLS  time, and an improved STS time.  These all show that patient has decreased her falls risk and improved her aerobic capacity and strength/power. Continued to work on improving patients strength and aerobic capacity during todays session. Patients lateral and forward stepping are improving, however, backwards stepping/walking are limited.  Patient will continue to benefit  from skilled therapy to return to prior level of function.    Personal Factors and Comorbidities Comorbidity 2;Past/Current Experience;Other    Comorbidities CHF, DVT, THA, CVA, "lung disorder"    Examination-Activity Limitations Carry;Lift;Locomotion Level;Stand;Stairs;Bend;Bathing;Dressing    Examination-Participation Restrictions Cleaning;Community Activity;Meal Prep    Stability/Clinical Decision Making Evolving/Moderate complexity    Clinical Decision Making Moderate    Rehab Potential Fair    Clinical Impairments Affecting Rehab Potential (+) highly motivated, family support (-) age, CHF, lack of opportunies for physical activity    PT Frequency 2x / week    PT Duration 12 weeks    PT Treatment/Interventions Electrical Stimulation;Iontophoresis 74m/ml Dexamethasone;Aquatic Therapy;Cryotherapy;Moist Heat;Therapeutic activities;Patient/family education;Manual techniques;Balance training;Stair training;Gait training;Neuromuscular re-education;Therapeutic exercise;Passive range of motion    PT Next Visit Plan establish HEP    PT Home Exercise Plan not yet established    Consulted and Agree with Plan of Care Patient;Family member/caregiver    Family Member Consulted Son           Patient will benefit from skilled therapeutic intervention in order to improve the following deficits and impairments:  Abnormal gait, Pain, Decreased coordination, Decreased mobility, Increased muscle spasms, Decreased endurance, Decreased range of motion, Decreased strength, Decreased balance, Difficulty walking, Decreased activity tolerance,  Cardiopulmonary status limiting activity, Dizziness  Visit Diagnosis: Muscle weakness (generalized)  Pain in right hip  Difficulty in walking, not elsewhere classified     Problem List Patient Active Problem List   Diagnosis Date Noted   Malnutrition of moderate degree 11/08/2019   COPD with acute exacerbation ([place] 11/06/2019   S/P laparoscopic cholecystectomy 10/30/19 11/06/2019   CHF (congestive heart failure) ([place] 11/06/2019   CKD (chronic kidney disease) stage 3, GFR 30-59 ml/min 11/06/2019   Acute on chronic systolic CHF (congestive heart failure) ([facility] 050/38/8828  Diastolic CHF ([facility] 000/34/9179  Acute respiratory failure with hypoxia (HCC)    Acute kidney injury superimposed on CKD ([facility]    Acute cholecystitis 08/31/2019   RUQ pain    Chronic obstructive pulmonary disease ([facility] [place]    Acute DVT (deep venous thrombosis) ([facility] 04/29/2018   Status post total hip replacement, right 04/20/2018   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 [place] [address]Phone: [phone]  Fax:  [phone] Name: GAasha DinaMRN: [id number] of Birth: 804-19-1928

## 2020-04-02 ENCOUNTER — Ambulatory Visit: Payer: Medicare Other

## 2020-04-02 ENCOUNTER — Other Ambulatory Visit: Payer: Self-pay

## 2020-04-02 DIAGNOSIS — M25551 Pain in right hip: Secondary | ICD-10-CM

## 2020-04-02 DIAGNOSIS — M6281 Muscle weakness (generalized): Secondary | ICD-10-CM

## 2020-04-02 DIAGNOSIS — R262 Difficulty in walking, not elsewhere classified: Secondary | ICD-10-CM

## 2020-04-02 NOTE — Therapy (Signed)
[facility] [address] [place], [address] Phone: [phone]   Fax:  [phone]  Physical Therapy Treatment  Patient Details  Name: Diana Powell MRN: [id number] Date of Birth: [birth year] Referring Provider (PT): Darylene Price, FNP   Encounter Date: 04/02/2020   PT End of Session - 04/02/20 1517    Visit Number 15    Number of Visits 24    Date for PT Re-Evaluation 03/18/20    Authorization Type MCR    Authorization Time Period 12/25/19-03/18/20    Authorization - Visit Number 14    Authorization - Number of Visits 16    PT Start Time 6962    PT Stop Time 1600    PT Time Calculation (min) 45 min    Equipment Utilized During Treatment Gait belt    Activity Tolerance Patient limited by fatigue;Patient tolerated treatment well;Treatment limited secondary to medical complications (Comment)    Behavior During Therapy [facility] for tasks assessed/performed           Past Medical History:  Diagnosis Date  . Atrial fibrillation ([place])   . CHF (congestive heart failure) ([place])   . Chronic systolic heart failure ([place])   . Complication of anesthesia    CONFUSION   . COPD (chronic obstructive pulmonary disease) ([place])   . Osteoarthritis, multiple sites   . Osteoporosis, post-menopausal   . Presence of permanent cardiac pacemaker     Past Surgical History:  Procedure Laterality Date  . CARDIOVERSION  ~2009  . HIP FRACTURE SURGERY Bilateral [phone]   each hip fractured at separate times  . INSERT / REPLACE / REMOVE PACEMAKER    . IR EXCHANGE BILIARY DRAIN  10/05/2019  . TOTAL HIP ARTHROPLASTY Right 04/20/2018   Procedure: TOTAL HIP CONVERSION;  Surgeon: Hessie Knows, MD;  Location: [facility] ORS;  Service: Orthopedics;  Laterality: Right;    There were no vitals filed for this visit.   Subjective Assessment - 04/02/20 1514    Subjective Patients son reports shes doing good today with it being her best day of the  week.    Patient is accompained by: Family member    Pertinent History ECHO 09/02/19: Moderate MVR, moderate TVR, mild AVR, EF: 45-50%. Recent Adjustments to apixaban and furosemide doses based on age/weight. Most recent Hb: 8.8 on iron supplementation, per PCP note down from baseline of 11.6 (09/14/19). Pt recently had a sleep study completed showing nocturnal hypoxia now on O2 over-night.    Limitations Lifting;Standing;Walking    How long can you sit comfortably? unlimited    How long can you stand comfortably? 15-20 minutes    How long can you walk comfortably? unknown, has been mostly performing household mobility since DC    Patient Stated Goals improved functional tolerance to AMB regarding breathing limitations    Currently in Pain? No/denies    Pain Onset More than a month ago          INTERVENTION Therapeutic Exercise -Cardiovascular conditioning for ABLA; Nustep 2x5 min minutes LEVEL 3, Seat 7, Arms 6, SPM cued >55, vitals taken q14mn. (SpO2:95% HR:70 bpm, (SpO2: 94% HR: 70 bpm) -STS 2x10 from green chair (SpO2:  -Ambulation using FWW 2x5014f(SpO2:93% HR:84 bpm; after ambulation)) -Lateral Step 2x10 UE support by therapist using ladder for visual cue for step length(SpO2: 94% HR: 75bpm)  -Forward/back 2x10 stepping UE Support by therapist using ladder for visual cue for step length (SpO2: 93% HR: 78 bpm)   Performed  Exercises to improve function capacity    PT Education - 04/02/20 1517    Education Details form/technique with exercises    Person(s) Educated Patient    Methods Explanation;Demonstration    Comprehension Verbalized understanding;Returned demonstration            PT Short Term Goals - 12/25/19 1957      PT SHORT TERM GOAL #1   Title After 4 weeks pt will tolerate 4 minutes sustained AMB c gait speed > 0.75ms to improve capacity to perform safe household distance AMB.    Baseline At evaluation 12/25/19: 3 minute tolerance at 0.665m c RW    Time 4     Period Weeks    Status New    Target Date 01/22/20      PT SHORT TERM GOAL #2   Title After 4 weeks pt to demonstrate improved 5xSTS from 19" surface, hands free in <16sec.    Baseline At eval 12/25/19: 20sec from chair with hand push-off.    Time 4    Period Weeks    Status New    Target Date 01/22/20             PT Long Term Goals - 03/25/20 1616      PT LONG TERM GOAL #1   Title After 8 weeks pt to demonstrate ability to perform 5 continuous minutes AMB @ >0.6010mc RW. (590f82f 5min10ms)    Baseline 375ft;66f3/2020: 600ft; 67f1/2020: 500ft; 066f/2021: at eval: 3 minutes only or 300ft.; 051f/21: 416 in 5 minutes    Time 8    Period Weeks    Status Partially Met      PT LONG TERM GOAL #2   Title After 8 weeks pt will demonstrate improve SLS >10sec BILAT at supervision level, no UE for recovery.    Baseline At eval 12/25/19: RLE ~3sec; LLE ~1sec; 03/25/20: RLE ~5sec  LLE ~5sec    Time 8    Period Weeks    Status New      PT LONG TERM GOAL #3   Title At 12 weeks pt will tolerate 15 minutes moderate aerobic actiivty on recumbent stepper with supplemental O2 PRN to prepar for community based exercise compliance at DC.    Baseline Not performing; 03/25/20: 10 minutes    Time 12    Period Weeks    Status On-going      PT LONG TERM GOAL #4   Title In 12 weeks patient will improve 5 x STS to under 12sec to decrease fall risk and improve LE functional strength.    Baseline 5xSTS: 27 sec; 05/22/2019: 20 sec; 06/19/2019: 20sec; 08/01/2019: Eval 12/25/19: 20sec; 03/25/20: 13.5 sec    Time 12    Period Weeks    Status New                 Plan - 04/02/20 1518    Clinical Impression Statement Remained focusing on improving patients overall function al capacity and LE strength during today's session.  Patients SpO2 remained normal throughout session and tolerated exercises well. Patient continues to become quickly fatigued during standing exercises and requires many rest  breaks throughout session to decrease SOB. Patient has been able to increase ambulation by 100ft in r63ft sessions while maintaining appropriate vitals during and after. Patient will continue to benefit from skilled therapy to address limitations remaining.    Personal Factors and Comorbidities Comorbidity 2;Past/Current Experience;Other    Comorbidities CHF, DVT, THA, CVA, "lung disorder"  Examination-Activity Limitations Carry;Lift;Locomotion Level;Stand;Stairs;Bend;Bathing;Dressing    Examination-Participation Restrictions Cleaning;Community Activity;Meal Prep    Stability/Clinical Decision Making Evolving/Moderate complexity    Clinical Decision Making Moderate    Rehab Potential Fair    Clinical Impairments Affecting Rehab Potential (+) highly motivated, family support (-) age, CHF, lack of opportunies for physical activity    PT Frequency 2x / week    PT Duration 12 weeks    PT Treatment/Interventions Electrical Stimulation;Iontophoresis 67m/ml Dexamethasone;Aquatic Therapy;Cryotherapy;Moist Heat;Therapeutic activities;Patient/family education;Manual techniques;Balance training;Stair training;Gait training;Neuromuscular re-education;Therapeutic exercise;Passive range of motion    PT Next Visit Plan Continue LE Strengthening    PT Home Exercise Plan not yet established    Consulted and Agree with Plan of Care Patient;Family member/caregiver    Family Member Consulted Son           Patient will benefit from skilled therapeutic intervention in order to improve the following deficits and impairments:  Abnormal gait, Pain, Decreased coordination, Decreased mobility, Increased muscle spasms, Decreased endurance, Decreased range of motion, Decreased strength, Decreased balance, Difficulty walking, Decreased activity tolerance, Cardiopulmonary status limiting activity, Dizziness  Visit Diagnosis: Muscle weakness (generalized)  Pain in right hip  Difficulty in walking, not elsewhere  classified     Problem List Patient Active Problem List   Diagnosis Date Noted  . Malnutrition of moderate degree 11/08/2019  . COPD with acute exacerbation ([place] 11/06/2019  . S/P laparoscopic cholecystectomy 10/30/19 11/06/2019  . CHF (congestive heart failure) ([place] 11/06/2019  . CKD (chronic kidney disease) stage 3, GFR 30-59 ml/min 11/06/2019  . Acute on chronic systolic CHF (congestive heart failure) ([place] 11/06/2019  . Diastolic CHF ([place]own[place] 003/00/9233 . Acute respiratory failure with hypoxia ([place]   . Acute kidney injury superimposed on CKD ([place]   . Acute cholecystitis 08/31/2019  . RUQ pain   . Chronic obstructive pulmonary disease ([place]   . Acute DVT (deep venous thrombosis) ([place] 04/29/2018  . 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 Dementia due to medical condition without behavioral disturbance ([place] 05/04/2013  . AF (paroxysmal atrial fibrillation) ([place]   . Chronic systolic CHF (congestive heart failure) ([place]   . Osteoporosis, post-menopausal   . Osteoarthritis, multiple sites    4:03 PM, 04/02/20 BMargarito Liner SPT Student Physical Therapist [place] BMargarito Liner9/15/2021, 4:03 PM  [facility] [address] [place] [address]Phone: [phone]  Fax:  [phone] Name: GHema LanzaMRN: [id number] of Birth: 807-Sep-1928

## 2020-04-08 ENCOUNTER — Ambulatory Visit: Payer: Medicare Other

## 2020-04-08 ENCOUNTER — Other Ambulatory Visit: Payer: Self-pay

## 2020-04-08 DIAGNOSIS — M6281 Muscle weakness (generalized): Secondary | ICD-10-CM

## 2020-04-08 DIAGNOSIS — M25551 Pain in right hip: Secondary | ICD-10-CM

## 2020-04-08 NOTE — Therapy (Signed)
[facility] [address], [place], [address] Phone: [phone]   Fax:  [phone]  Physical Therapy Treatment  Patient Details  Name: Diana Powell MRN: [id number] Date of Birth: 1927-04-06 Referring Provider (PT): Darylene Price, FNP   Encounter Date: 04/08/2020   PT End of Session - 04/08/20 1446    Visit Number 16    Number of Visits 24    Date for PT Re-Evaluation 05/20/20    Authorization Type MCR    Authorization Time Period 12/25/19-03/18/20    Authorization - Visit Number 15    Authorization - Number of Visits 16    PT Start Time 3295    PT Stop Time 1530    PT Time Calculation (min) 45 min    Equipment Utilized During Treatment Gait belt    Activity Tolerance Patient limited by fatigue;Patient tolerated treatment well;Treatment limited secondary to medical complications (Comment)    Behavior During Therapy [facility] for tasks assessed/performed           Past Medical History:  Diagnosis Date  . Atrial fibrillation ([place])   . CHF (congestive heart failure) ([place])   . Chronic systolic heart failure ([place])   . Complication of anesthesia    CONFUSION   . COPD (chronic obstructive pulmonary disease) ([place])   . Osteoarthritis, multiple sites   . Osteoporosis, post-menopausal   . Presence of permanent cardiac pacemaker     Past Surgical History:  Procedure Laterality Date  . CARDIOVERSION  ~2009  . HIP FRACTURE SURGERY Bilateral [phone]   each hip fractured at separate times  . INSERT / REPLACE / REMOVE PACEMAKER    . IR EXCHANGE BILIARY DRAIN  10/05/2019  . TOTAL HIP ARTHROPLASTY Right 04/20/2018   Procedure: TOTAL HIP CONVERSION;  Surgeon: Hessie Knows, MD;  Location: [facility] ORS;  Service: Orthopedics;  Laterality: Right;    There were no vitals filed for this visit.   Subjective Assessment - 04/08/20 1444    Subjective Patient's son reports she's been tired today but otherwise no issues.     Patient is accompained by: Family member    Pertinent History ECHO 09/02/19: Moderate MVR, moderate TVR, mild AVR, EF: 45-50%. Recent Adjustments to apixaban and furosemide doses based on age/weight. Most recent Hb: 8.8 on iron supplementation, per PCP note down from baseline of 11.6 (09/14/19). Pt recently had a sleep study completed showing nocturnal hypoxia now on O2 over-night.    Limitations Lifting;Standing;Walking    How long can you sit comfortably? unlimited    How long can you stand comfortably? 15-20 minutes    How long can you walk comfortably? unknown, has been mostly performing household mobility since DC    Patient Stated Goals improved functional tolerance to AMB regarding breathing limitations    Currently in Pain? No/denies    Pain Onset More than a month ago           INTERVENTION  Therapeutic Exercise -Cardiovascular conditioning for ABLA; Nustep 2x5 min minutes LEVEL 3, Seat 7, Arms 6, SPM cued >55, vitals taken q59mn. (SpO2:96% HR:73 bpm), (SpO2:95% HR:69 bpm) -STS 2x10 from green chair (SpO2: 95% HR: 126 bpm) -Ambulation using FWW 2x5062f(SpO2:96% HR:82 bpm; after ambulation)) -Forward/backward/lateral step-over with QC on side x8 Bilat each (SpO2: 94% HR: 86bpm)  -Forward/lateral steps x10 stepping UE Support by therapist (SpO2: 96% HR: 84 bpm) -Cone Taps (2 cones each stack) x5 B    Performed Exercises to improve function capacity  PT Education - 04/08/20 1446    Education Details form/technique with exercises    Person(s) Educated Patient    Methods Explanation;Demonstration    Comprehension Verbalized understanding;Returned demonstration            PT Short Term Goals - 12/25/19 1957      PT SHORT TERM GOAL #1   Title After 4 weeks pt will tolerate 4 minutes sustained AMB c gait speed > 0.2ms to improve capacity to perform safe household distance AMB.    Baseline At evaluation 12/25/19: 3 minute tolerance at 0.643m c RW    Time 4    Period Weeks     Status New    Target Date 01/22/20      PT SHORT TERM GOAL #2   Title After 4 weeks pt to demonstrate improved 5xSTS from 19" surface, hands free in <16sec.    Baseline At eval 12/25/19: 20sec from chair with hand push-off.    Time 4    Period Weeks    Status New    Target Date 01/22/20             PT Long Term Goals - 03/25/20 1616      PT LONG TERM GOAL #1   Title After 8 weeks pt to demonstrate ability to perform 5 continuous minutes AMB @ >0.6037mc RW. (590f32f 5min48ms)    Baseline 375ft;49f3/2020: 600ft; 8f1/2020: 500ft; 059f/2021: at eval: 3 minutes only or 300ft.; 0110f/21: 416 in 5 minutes    Time 8    Period Weeks    Status Partially Met      PT LONG TERM GOAL #2   Title After 8 weeks pt will demonstrate improve SLS >10sec BILAT at supervision level, no UE for recovery.    Baseline At eval 12/25/19: RLE ~3sec; LLE ~1sec; 03/25/20: RLE ~5sec  LLE ~5sec    Time 8    Period Weeks    Status New      PT LONG TERM GOAL #3   Title At 12 weeks pt will tolerate 15 minutes moderate aerobic actiivty on recumbent stepper with supplemental O2 PRN to prepar for community based exercise compliance at DC.    Baseline Not performing; 03/25/20: 10 minutes    Time 12    Period Weeks    Status On-going      PT LONG TERM GOAL #4   Title In 12 weeks patient will improve 5 x STS to under 12sec to decrease fall risk and improve LE functional strength.    Baseline 5xSTS: 27 sec; 05/22/2019: 20 sec; 06/19/2019: 20sec; 08/01/2019: Eval 12/25/19: 20sec; 03/25/20: 13.5 sec    Time 12    Period Weeks    Status New                 Plan - 04/08/20 1520    Clinical Impression Statement Progressed patients exercises as tolerated by increasing repetitions of exercises.  Patient tolerated exercises well with episodes of SOB along with moderate fatigue that required longer rest breaks.  Patient continues to improve with stepping by being able to perform step through pattern and her SLS  is also improved which was demonstrated while performing cone taps. Patient will continue to benefit from skilled therapy to return to prior level of function.    Personal Factors and Comorbidities Comorbidity 2;Past/Current Experience;Other    Comorbidities CHF, DVT, THA, CVA, "lung disorder"    Examination-Activity Limitations Carry;Lift;Locomotion Level;Stand;Stairs;Bend;Bathing;Dressing    Examination-Participation Restrictions Cleaning;Community Activity;Meal  Prep    Stability/Clinical Decision Making Evolving/Moderate complexity    Clinical Decision Making Moderate    Rehab Potential Fair    Clinical Impairments Affecting Rehab Potential (+) highly motivated, family support (-) age, CHF, lack of opportunies for physical activity    PT Frequency 2x / week    PT Duration 12 weeks    PT Treatment/Interventions Electrical Stimulation;Iontophoresis 10m/ml Dexamethasone;Aquatic Therapy;Cryotherapy;Moist Heat;Therapeutic activities;Patient/family education;Manual techniques;Balance training;Stair training;Gait training;Neuromuscular re-education;Therapeutic exercise;Passive range of motion    PT Next Visit Plan Continue LE Strengthening    PT Home Exercise Plan not yet established    Consulted and Agree with Plan of Care Patient;Family member/caregiver    Family Member Consulted Son           Patient will benefit from skilled therapeutic intervention in order to improve the following deficits and impairments:  Abnormal gait, Pain, Decreased coordination, Decreased mobility, Increased muscle spasms, Decreased endurance, Decreased range of motion, Decreased strength, Decreased balance, Difficulty walking, Decreased activity tolerance, Cardiopulmonary status limiting activity, Dizziness  Visit Diagnosis: Muscle weakness (generalized)  Pain in right hip     Problem List Patient Active Problem List   Diagnosis Date Noted  . Malnutrition of moderate degree 11/08/2019  . COPD with acute  exacerbation ([place] 11/06/2019  . S/P laparoscopic cholecystectomy 10/30/19 11/06/2019  . CHF (congestive heart failure) ([place] 11/06/2019  . CKD (chronic kidney disease) stage 3, GFR 30-59 ml/min 11/06/2019  . Acute on chronic systolic CHF (congestive heart failure) ([place] 11/06/2019  . Diastolic CHF ([place] 078/46/9629 . Acute respiratory failure with hypoxia (HD[place]   . Acute kidney injury superimposed on CKD (HM[place]   . Acute cholecystitis 08/31/2019  . RUQ pain   . Chronic obstructive pulmonary disease ([place]   . Acute DVT (deep venous thrombosis) (HC[place] 04/29/2018  . 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 Dementia due to medical condition without behavioral disturbance ([place] 05/04/2013  . AF (paroxysmal atrial fibrillation) ([place]   . Chronic systolic CHF (congestive heart failure) ([place]   . Osteoporosis, post-menopausal   . Osteoarthritis, multiple sites    3:28 PM, 04/08/20 BMargarito Liner STourney Plaza Surgical CenterStudent Physical Therapist [place] [phone] BMargarito Liner9/21/2021, 3:26 PM  [facility] [address] [place] [address]Phone: [phone]  Fax:  [phone] Name: Diana MihalicMRN: [id number] of Birth: 8Aug 23, 1928

## 2020-04-17 ENCOUNTER — Other Ambulatory Visit: Payer: Self-pay

## 2020-04-17 ENCOUNTER — Ambulatory Visit: Payer: Medicare Other

## 2020-04-17 DIAGNOSIS — M25551 Pain in right hip: Secondary | ICD-10-CM

## 2020-04-17 DIAGNOSIS — R262 Difficulty in walking, not elsewhere classified: Secondary | ICD-10-CM

## 2020-04-17 DIAGNOSIS — M6281 Muscle weakness (generalized): Secondary | ICD-10-CM | POA: Diagnosis not present

## 2020-04-17 NOTE — Therapy (Signed)
[facility] [address] [place], [address] Phone: [phone]   Fax:  [phone]  Physical Therapy Treatment  Patient Details  Name: Diana Powell MRN: [id number] Date of Birth: 06/16/1927 Referring Provider (PT): Darylene Price, FNP   Encounter Date: 04/17/2020   PT End of Session - 04/17/20 1525    Visit Number 17    Number of Visits 24    Date for PT Re-Evaluation 05/20/20    Authorization Type MCR    Authorization Time Period 12/25/19-03/18/20    Authorization - Visit Number 15    Authorization - Number of Visits 16    PT Start Time 1520    PT Stop Time 1600    PT Time Calculation (min) 40 min    Equipment Utilized During Treatment Gait belt    Activity Tolerance Patient limited by fatigue;Patient tolerated treatment well;Treatment limited secondary to medical complications (Comment)    Behavior During Therapy [facility] for tasks assessed/performed           Past Medical History:  Diagnosis Date  . Atrial fibrillation ([place])   . CHF (congestive heart failure) ([place])   . Chronic systolic heart failure ([place])   . Complication of anesthesia    CONFUSION   . COPD (chronic obstructive pulmonary disease) ([place])   . Osteoarthritis, multiple sites   . Osteoporosis, post-menopausal   . Presence of permanent cardiac pacemaker     Past Surgical History:  Procedure Laterality Date  . CARDIOVERSION  ~2009  . HIP FRACTURE SURGERY Bilateral [phone]   each hip fractured at separate times  . INSERT / REPLACE / REMOVE PACEMAKER    . IR EXCHANGE BILIARY DRAIN  10/05/2019  . TOTAL HIP ARTHROPLASTY Right 04/20/2018   Procedure: TOTAL HIP CONVERSION;  Surgeon: Hessie Knows, MD;  Location: [facility] ORS;  Service: Orthopedics;  Laterality: Right;    There were no vitals filed for this visit.   Subjective Assessment - 04/17/20 1524    Subjective Patients son reports shes doing well    Patient is accompained by: Family  member    Pertinent History ECHO 09/02/19: Moderate MVR, moderate TVR, mild AVR, EF: 45-50%. Recent Adjustments to apixaban and furosemide doses based on age/weight. Most recent Hb: 8.8 on iron supplementation, per PCP note down from baseline of 11.6 (09/14/19). Pt recently had a sleep study completed showing nocturnal hypoxia now on O2 over-night.    Limitations Lifting;Standing;Walking    How long can you sit comfortably? unlimited    How long can you stand comfortably? 15-20 minutes    How long can you walk comfortably? unknown, has been mostly performing household mobility since DC    Patient Stated Goals improved functional tolerance to AMB regarding breathing limitations    Currently in Pain? No/denies    Pain Onset More than a month ago          INTERVENTION   Therapeutic Exercise -Cardiovascular conditioning for ABLA; Nustep 2x5 min minutes LEVEL 4, Seat 7, Arms 6, SPM cued >55, vitals taken q92mn. (SpO2:96% HR:73 bpm), (SpO2:95% HR:69 bpm) -STS 2x10 from green chair (SpO2: 97% HR: 119bpm) -Ambulation using FWW 2x504f(SpO2:96% HR:75 bpm; after ambulation)) -Forward/backward/lateral using ladder x2 (SpO2: 94% HR: 86bpm)  -Forward/lateral using ladder x2 (SpO2: 96% HR: 96 bpm) -Backward Walking x2516fTandem Stance x25f34f  Performed Exercises to improve function capacity       PT Education - 04/17/20 1525    Education Details  form/technique with exercises    Person(s) Educated Patient    Methods Explanation;Demonstration    Comprehension Verbalized understanding;Returned demonstration            PT Short Term Goals - 12/25/19 1957      PT SHORT TERM GOAL #1   Title After 4 weeks pt will tolerate 4 minutes sustained AMB c gait speed > 0.62ms to improve capacity to perform safe household distance AMB.    Baseline At evaluation 12/25/19: 3 minute tolerance at 0.6107m c RW    Time 4    Period Weeks    Status New    Target Date 01/22/20      PT SHORT TERM GOAL #2    Title After 4 weeks pt to demonstrate improved 5xSTS from 19" surface, hands free in <16sec.    Baseline At eval 12/25/19: 20sec from chair with hand push-off.    Time 4    Period Weeks    Status New    Target Date 01/22/20             PT Long Term Goals - 03/25/20 1616      PT LONG TERM GOAL #1   Title After 8 weeks pt to demonstrate ability to perform 5 continuous minutes AMB @ >0.6012mc RW. (590f51f 5min62ms)    Baseline 375ft;16f3/2020: 600ft; 24f1/2020: 500ft; 072f/2021: at eval: 3 minutes only or 300ft.; 067f/21: 416 in 5 minutes    Time 8    Period Weeks    Status Partially Met      PT LONG TERM GOAL #2   Title After 8 weeks pt will demonstrate improve SLS >10sec BILAT at supervision level, no UE for recovery.    Baseline At eval 12/25/19: RLE ~3sec; LLE ~1sec; 03/25/20: RLE ~5sec  LLE ~5sec    Time 8    Period Weeks    Status New      PT LONG TERM GOAL #3   Title At 12 weeks pt will tolerate 15 minutes moderate aerobic actiivty on recumbent stepper with supplemental O2 PRN to prepar for community based exercise compliance at DC.    Baseline Not performing; 03/25/20: 10 minutes    Time 12    Period Weeks    Status On-going      PT LONG TERM GOAL #4   Title In 12 weeks patient will improve 5 x STS to under 12sec to decrease fall risk and improve LE functional strength.    Baseline 5xSTS: 27 sec; 05/22/2019: 20 sec; 06/19/2019: 20sec; 08/01/2019: Eval 12/25/19: 20sec; 03/25/20: 13.5 sec    Time 12    Period Weeks    Status New                 Plan - 04/17/20 1602    Clinical Impression Statement Continued to challenge patient's endurance along with strength during today's session.  Patient tolerated session well with mild SOB after exercises that required seated breaks to recover.  Patient continues to improve; however, strength and cardiovascular endurance remain limited that limits patient's ability for leisure and community activities.  Patient will  benefit from further skilled therapy to improve deficits and limitations.    Personal Factors and Comorbidities Comorbidity 2;Past/Current Experience;Other    Comorbidities CHF, DVT, THA, CVA, "lung disorder"    Examination-Activity Limitations Carry;Lift;Locomotion Level;Stand;Stairs;Bend;Bathing;Dressing    Examination-Participation Restrictions Cleaning;Community Activity;Meal Prep    Stability/Clinical Decision Making Evolving/Moderate complexity    Clinical Decision Making Moderate    Rehab  Potential Fair    Clinical Impairments Affecting Rehab Potential (+) highly motivated, family support (-) age, CHF, lack of opportunies for physical activity    PT Frequency 2x / week    PT Duration 12 weeks    PT Treatment/Interventions Electrical Stimulation;Iontophoresis 70m/ml Dexamethasone;Aquatic Therapy;Cryotherapy;Moist Heat;Therapeutic activities;Patient/family education;Manual techniques;Balance training;Stair training;Gait training;Neuromuscular re-education;Therapeutic exercise;Passive range of motion    PT Next Visit Plan Continue LE Strengthening    PT Home Exercise Plan not yet established    Consulted and Agree with Plan of Care Patient;Family member/caregiver    Family Member Consulted Son           Patient will benefit from skilled therapeutic intervention in order to improve the following deficits and impairments:  Abnormal gait, Pain, Decreased coordination, Decreased mobility, Increased muscle spasms, Decreased endurance, Decreased range of motion, Decreased strength, Decreased balance, Difficulty walking, Decreased activity tolerance, Cardiopulmonary status limiting activity, Dizziness  Visit Diagnosis: Muscle weakness (generalized)  Pain in right hip  Difficulty in walking, not elsewhere classified     Problem List Patient Active Problem List   Diagnosis Date Noted  . Malnutrition of moderate degree 11/08/2019  . COPD with acute exacerbation ([place] 11/06/2019  . S/P  laparoscopic cholecystectomy 10/30/19 11/06/2019  . CHF (congestive heart failure) ([place] 11/06/2019  . CKD (chronic kidney disease) stage 3, GFR 30-59 ml/min 11/06/2019  . Acute on chronic systolic CHF (congestive heart failure) ([place] 11/06/2019  . Diastolic CHF ([facility] 006/77/0340 . Acute respiratory failure with hypoxia ([facility]   . Acute kidney injury superimposed on CKD ([facility]   . Acute cholecystitis 08/31/2019  . RUQ pain   . Chronic obstructive pulmonary disease ([facility]   . Acute DVT (deep venous thrombosis) ([facility] 04/29/2018  . 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 Dementia due to medical condition without behavioral disturbance ([place] 05/04/2013  . AF (paroxysmal atrial fibrillation) ([place]   . Chronic systolic CHF (congestive heart failure) ([place]   . Osteoporosis, post-menopausal   . Osteoarthritis, multiple sites    4:03 PM, 04/17/20 BMargarito Liner SPT Student Physical Therapist [place] BMargarito Liner9/30/2021, 4:02 PM  [facility] [address] [place] [address]Phone: [phone]  Fax:  [phone] Name: GToshika ParrowMRN: [id number] of Birth: 811/12/1926

## 2020-04-21 ENCOUNTER — Other Ambulatory Visit: Payer: Self-pay

## 2020-04-21 ENCOUNTER — Ambulatory Visit: Payer: Medicare Other | Attending: Geriatric Medicine

## 2020-04-21 DIAGNOSIS — M25551 Pain in right hip: Secondary | ICD-10-CM | POA: Diagnosis present

## 2020-04-21 DIAGNOSIS — R262 Difficulty in walking, not elsewhere classified: Secondary | ICD-10-CM | POA: Diagnosis present

## 2020-04-21 DIAGNOSIS — M6281 Muscle weakness (generalized): Secondary | ICD-10-CM | POA: Diagnosis present

## 2020-04-21 NOTE — Therapy (Signed)
[facility] [address] [place], [address] Phone: [phone]   Fax:  [phone]  Physical Therapy Treatment  Patient Details  Name: Diana Powell MRN: [id number] Date of Birth: [birth year] Referring Provider (PT): Darylene Price, FNP   Encounter Date: 04/21/2020   PT End of Session - 04/21/20 1519    Visit Number 18    Number of Visits 24    Date for PT Re-Evaluation 05/20/20    Authorization Type MCR    Authorization Time Period 12/25/19-03/18/20    Authorization - Visit Number 16    Authorization - Number of Visits 16    PT Start Time 6222    PT Stop Time 1600    PT Time Calculation (min) 45 min    Equipment Utilized During Treatment Gait belt    Activity Tolerance Patient limited by fatigue;Patient tolerated treatment well;Treatment limited secondary to medical complications (Comment)    Behavior During Therapy [facility] for tasks assessed/performed           Past Medical History:  Diagnosis Date  . Atrial fibrillation ([place])   . CHF (congestive heart failure) ([place])   . Chronic systolic heart failure ([place])   . Complication of anesthesia    CONFUSION   . COPD (chronic obstructive pulmonary disease) ([place])   . Osteoarthritis, multiple sites   . Osteoporosis, post-menopausal   . Presence of permanent cardiac pacemaker     Past Surgical History:  Procedure Laterality Date  . CARDIOVERSION  ~2009  . HIP FRACTURE SURGERY Bilateral [phone]   each hip fractured at separate times  . INSERT / REPLACE / REMOVE PACEMAKER    . IR EXCHANGE BILIARY DRAIN  10/05/2019  . TOTAL HIP ARTHROPLASTY Right 04/20/2018   Procedure: TOTAL HIP CONVERSION;  Surgeon: Hessie Knows, MD;  Location: [facility] ORS;  Service: Orthopedics;  Laterality: Right;    There were no vitals filed for this visit.   Subjective Assessment - 04/21/20 1518    Subjective Patients friends reports she is doing well just tired today.    Patient is  accompained by: Family member    Pertinent History ECHO 09/02/19: Moderate MVR, moderate TVR, mild AVR, EF: 45-50%. Recent Adjustments to apixaban and furosemide doses based on age/weight. Most recent Hb: 8.8 on iron supplementation, per PCP note down from baseline of 11.6 (09/14/19). Pt recently had a sleep study completed showing nocturnal hypoxia now on O2 over-night.    Limitations Lifting;Standing;Walking    How long can you sit comfortably? unlimited    How long can you stand comfortably? 15-20 minutes    How long can you walk comfortably? unknown, has been mostly performing household mobility since DC    Patient Stated Goals improved functional tolerance to AMB regarding breathing limitations    Currently in Pain? No/denies    Pain Onset More than a month ago           INTERVENTION   Therapeutic Exercise -Cardiovascular conditioning for ABLA; Nustep 2x5 min minutes LEVEL 4, Seat 7, Arms 6, SPM cued >55, vitals taken q24mn. (SpO2:94% HR:73 bpm), (SpO2:95% HR:69 bpm) -STS 2x10 from green chair (SpO2: 96% HR: 116bpm) -Ambulation using FWW 2x5024f(SpO2:96% HR:75 bpm; after ambulation)) -Half Foam Roller Step overs/back: x7 (SpO2: 96% HR: 78bpm) -Half Foam Roller Lateral Step Overs: x7 (SpO2: 94% HR: 86bpm) -Backward Walking x2582fLateral Step x8 B x25f48fTandem Stance x25ft78f Performed Exercises to improve function capacity  PT Education - 04/21/20 1519    Education Details Form/technique with exercises    Person(s) Educated Patient    Methods Explanation;Demonstration    Comprehension Returned demonstration;Verbalized understanding            PT Short Term Goals - 12/25/19 1957      PT SHORT TERM GOAL #1   Title After 4 weeks pt will tolerate 4 minutes sustained AMB c gait speed > 0.24ms to improve capacity to perform safe household distance AMB.    Baseline At evaluation 12/25/19: 3 minute tolerance at 0.692m c RW    Time 4    Period Weeks    Status New     Target Date 01/22/20      PT SHORT TERM GOAL #2   Title After 4 weeks pt to demonstrate improved 5xSTS from 19" surface, hands free in <16sec.    Baseline At eval 12/25/19: 20sec from chair with hand push-off.    Time 4    Period Weeks    Status New    Target Date 01/22/20             PT Long Term Goals - 03/25/20 1616      PT LONG TERM GOAL #1   Title After 8 weeks pt to demonstrate ability to perform 5 continuous minutes AMB @ >0.6015mc RW. (590f62f 5min24ms)    Baseline 375ft;27f3/2020: 600ft; 40f1/2020: 500ft; 04f/2021: at eval: 3 minutes only or 300ft.; 013f/21: 416 in 5 minutes    Time 8    Period Weeks    Status Partially Met      PT LONG TERM GOAL #2   Title After 8 weeks pt will demonstrate improve SLS >10sec BILAT at supervision level, no UE for recovery.    Baseline At eval 12/25/19: RLE ~3sec; LLE ~1sec; 03/25/20: RLE ~5sec  LLE ~5sec    Time 8    Period Weeks    Status New      PT LONG TERM GOAL #3   Title At 12 weeks pt will tolerate 15 minutes moderate aerobic actiivty on recumbent stepper with supplemental O2 PRN to prepar for community based exercise compliance at DC.    Baseline Not performing; 03/25/20: 10 minutes    Time 12    Period Weeks    Status On-going      PT LONG TERM GOAL #4   Title In 12 weeks patient will improve 5 x STS to under 12sec to decrease fall risk and improve LE functional strength.    Baseline 5xSTS: 27 sec; 05/22/2019: 20 sec; 06/19/2019: 20sec; 08/01/2019: Eval 12/25/19: 20sec; 03/25/20: 13.5 sec    Time 12    Period Weeks    Status New                 Plan - 04/21/20 1520    Clinical Impression Statement Patient has made improvements in LE strength and endurance; demonstrated by her ability to walk a longer distance at a faster speed before needing a rest break.  However, she fatigues quickly when performing exercises that require a period of SLS.  Overall, patient is improving, and she will continue to benefit from  skilled therapy to address remaining deficits and return to prior level of function.    Personal Factors and Comorbidities Comorbidity 2;Past/Current Experience;Other    Comorbidities CHF, DVT, THA, CVA, "lung disorder"    Examination-Activity Limitations Carry;Lift;Locomotion Level;Stand;Stairs;Bend;Bathing;Dressing    Examination-Participation Restrictions Cleaning;Community Activity;Meal Prep    Stability/Clinical  Decision Making Evolving/Moderate complexity    Clinical Decision Making Moderate    Rehab Potential Fair    Clinical Impairments Affecting Rehab Potential (+) highly motivated, family support (-) age, CHF, lack of opportunies for physical activity    PT Frequency 2x / week    PT Duration 12 weeks    PT Treatment/Interventions Electrical Stimulation;Iontophoresis 38m/ml Dexamethasone;Aquatic Therapy;Cryotherapy;Moist Heat;Therapeutic activities;Patient/family education;Manual techniques;Balance training;Stair training;Gait training;Neuromuscular re-education;Therapeutic exercise;Passive range of motion    PT Next Visit Plan Continue LE Strengthening    PT Home Exercise Plan not yet established    Consulted and Agree with Plan of Care Patient;Family member/caregiver    Family Member Consulted Friend           Patient will benefit from skilled therapeutic intervention in order to improve the following deficits and impairments:  Abnormal gait, Pain, Decreased coordination, Decreased mobility, Increased muscle spasms, Decreased endurance, Decreased range of motion, Decreased strength, Decreased balance, Difficulty walking, Decreased activity tolerance, Cardiopulmonary status limiting activity, Dizziness  Visit Diagnosis: Muscle weakness (generalized)  Pain in right hip  Difficulty in walking, not elsewhere classified     Problem List Patient Active Problem List   Diagnosis Date Noted  . Malnutrition of moderate degree 11/08/2019  . COPD with acute exacerbation ([place]  11/06/2019  . S/P laparoscopic cholecystectomy 10/30/19 11/06/2019  . CHF (congestive heart failure) ([facility] 11/06/2019  . CKD (chronic kidney disease) stage 3, GFR 30-59 ml/min (HCC) 11/06/2019  . Acute on chronic systolic CHF (congestive heart failure) (HL[place] 11/06/2019  . Diastolic CHF (HM[place] 092/07/69 . Acute respiratory failure with hypoxia (HWhite [place]   . Acute kidney injury superimposed on CKD ([place]   . Acute cholecystitis 08/31/2019  . RUQ pain   . Chronic obstructive pulmonary disease (HCam[place]   . Acute DVT (deep venous thrombosis) ([place] 04/29/2018  . 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 Dementia due to medical condition without behavioral disturbance ([place] 05/04/2013  . AF (paroxysmal atrial fibrillation) ([facility]   . Chronic systolic CHF (congestive heart failure) (HLync[place]   . Osteoporosis, post-menopausal   . Osteoarthritis, multiple sites    3:59 PM, 04/21/20 BMargarito Liner SPassavant Area HospitalStudent Physical Therapist CCastle Point BMargarito Liner10/10/2019, 3:59 PM  [facility] [address] [place] [address]Phone: [phone]  Fax:  [phone] Name: GDacie MandelMRN: [id number] of Birth: [birth year]

## 2020-04-28 ENCOUNTER — Other Ambulatory Visit: Payer: Self-pay

## 2020-04-28 ENCOUNTER — Ambulatory Visit: Payer: Medicare Other

## 2020-04-28 DIAGNOSIS — M6281 Muscle weakness (generalized): Secondary | ICD-10-CM | POA: Diagnosis not present

## 2020-04-28 DIAGNOSIS — M25551 Pain in right hip: Secondary | ICD-10-CM

## 2020-04-28 DIAGNOSIS — R262 Difficulty in walking, not elsewhere classified: Secondary | ICD-10-CM

## 2020-04-28 NOTE — Therapy (Signed)
[facility] [address], [place], [address] Phone: [phone]   Fax:  [phone]  Physical Therapy Treatment  Patient Details  Name: Diana Powell MRN: [id number] Date of Birth: 1926-11-03 Referring Provider (PT): Darylene Price, FNP   Encounter Date: 04/28/2020   PT End of Session - 04/28/20 1521    Visit Number 19    Number of Visits 24    Date for PT Re-Evaluation 05/20/20    Authorization Type MCR    Authorization Time Period 12/25/19-03/18/20    Authorization - Visit Number 16    Authorization - Number of Visits 16    PT Start Time 8938    PT Stop Time 1600    PT Time Calculation (min) 45 min    Equipment Utilized During Treatment Gait belt    Activity Tolerance Patient limited by fatigue;Patient tolerated treatment well;Treatment limited secondary to medical complications (Comment)    Behavior During Therapy [facility] for tasks assessed/performed           Past Medical History:  Diagnosis Date  . Atrial fibrillation ([place])   . CHF (congestive heart failure) ([place])   . Chronic systolic heart failure ([place])   . Complication of anesthesia    CONFUSION   . COPD (chronic obstructive pulmonary disease) ([place])   . Osteoarthritis, multiple sites   . Osteoporosis, post-menopausal   . Presence of permanent cardiac pacemaker     Past Surgical History:  Procedure Laterality Date  . CARDIOVERSION  ~2009  . HIP FRACTURE SURGERY Bilateral [phone]   each hip fractured at separate times  . INSERT / REPLACE / REMOVE PACEMAKER    . IR EXCHANGE BILIARY DRAIN  10/05/2019  . TOTAL HIP ARTHROPLASTY Right 04/20/2018   Procedure: TOTAL HIP CONVERSION;  Surgeon: Hessie Knows, MD;  Location: [facility] ORS;  Service: Orthopedics;  Laterality: Right;    There were no vitals filed for this visit.   Subjective Assessment - 04/28/20 1519    Subjective Patients son reports that shes been not doing as good the last few days.     Patient is accompained by: Family member    Pertinent History ECHO 09/02/19: Moderate MVR, moderate TVR, mild AVR, EF: 45-50%. Recent Adjustments to apixaban and furosemide doses based on age/weight. Most recent Hb: 8.8 on iron supplementation, per PCP note down from baseline of 11.6 (09/14/19). Pt recently had a sleep study completed showing nocturnal hypoxia now on O2 over-night.    Limitations Lifting;Standing;Walking    How long can you sit comfortably? unlimited    How long can you stand comfortably? 15-20 minutes    How long can you walk comfortably? unknown, has been mostly performing household mobility since DC    Patient Stated Goals improved functional tolerance to AMB regarding breathing limitations    Currently in Pain? No/denies    Pain Onset More than a month ago            INTERVENTION   Therapeutic Exercise -Cardiovascular conditioning for ABLA; Nustep 2x5 min minutes LEVEL 4, Seat 7, Arms 6, SPM cued >55, vitals taken q70mn. (SpO2:97% HR:74 bpm), (SpO2:97% HR:75 bpm) -STS 2x10 from green chair (SpO2: 97% HR: 127bpm) -Ambulation using FWW 2x5039f(SpO2:96% HR:75 bpm; after ambulation)) -Forward Walking w/o AD: x2554fSpO2: 96% HR: 86bpm) -Stairs x4 (SpO2: 96% HR: 83bpm) -Backward Walking x25f25fpO2: 95% HR: 83bpm) -Lateral Step x8 B x25ft24fpO2: 97% HR: 85bpm)   Performed Exercises to improve function capacity  PT Education - 04/28/20 1521    Education Details Form/technique with exercises    Person(s) Educated Patient    Methods Explanation;Demonstration    Comprehension Verbalized understanding;Returned demonstration            PT Short Term Goals - 12/25/19 1957      PT SHORT TERM GOAL #1   Title After 4 weeks pt will tolerate 4 minutes sustained AMB c gait speed > 0.64ms to improve capacity to perform safe household distance AMB.    Baseline At evaluation 12/25/19: 3 minute tolerance at 0.668m c RW    Time 4    Period Weeks    Status New    Target  Date 01/22/20      PT SHORT TERM GOAL #2   Title After 4 weeks pt to demonstrate improved 5xSTS from 19" surface, hands free in <16sec.    Baseline At eval 12/25/19: 20sec from chair with hand push-off.    Time 4    Period Weeks    Status New    Target Date 01/22/20             PT Long Term Goals - 03/25/20 1616      PT LONG TERM GOAL #1   Title After 8 weeks pt to demonstrate ability to perform 5 continuous minutes AMB @ >0.6074mc RW. (590f28f 5min38ms)    Baseline 375ft;62f3/2020: 600ft; 55f1/2020: 500ft; 04f/2021: at eval: 3 minutes only or 300ft.; 042f/21: 416 in 5 minutes    Time 8    Period Weeks    Status Partially Met      PT LONG TERM GOAL #2   Title After 8 weeks pt will demonstrate improve SLS >10sec BILAT at supervision level, no UE for recovery.    Baseline At eval 12/25/19: RLE ~3sec; LLE ~1sec; 03/25/20: RLE ~5sec  LLE ~5sec    Time 8    Period Weeks    Status New      PT LONG TERM GOAL #3   Title At 12 weeks pt will tolerate 15 minutes moderate aerobic actiivty on recumbent stepper with supplemental O2 PRN to prepar for community based exercise compliance at DC.    Baseline Not performing; 03/25/20: 10 minutes    Time 12    Period Weeks    Status On-going      PT LONG TERM GOAL #4   Title In 12 weeks patient will improve 5 x STS to under 12sec to decrease fall risk and improve LE functional strength.    Baseline 5xSTS: 27 sec; 05/22/2019: 20 sec; 06/19/2019: 20sec; 08/01/2019: Eval 12/25/19: 20sec; 03/25/20: 13.5 sec    Time 12    Period Weeks    Status New                 Plan - 04/28/20 1522    Clinical Impression Statement Continued to address patient's limitations in strength, endurance, and balance during today's session. Patient demonstrates improvements with ambulation and ability to perform STS exercise from a standard chair height. Although patient is improving, she continues to have increased cardiovascular and balance limitations.  Patient will benefit from further skilled therapy to return to prior level of function and progress towards goals.    Personal Factors and Comorbidities Comorbidity 2;Past/Current Experience;Other    Comorbidities CHF, DVT, THA, CVA, "lung disorder"    Examination-Activity Limitations Carry;Lift;Locomotion Level;Stand;Stairs;Bend;Bathing;Dressing    Examination-Participation Restrictions Cleaning;Community Activity;Meal Prep    Stability/Clinical Decision Making Evolving/Moderate complexity  Clinical Decision Making Moderate    Rehab Potential Fair    Clinical Impairments Affecting Rehab Potential (+) highly motivated, family support (-) age, CHF, lack of opportunies for physical activity    PT Frequency 2x / week    PT Duration 12 weeks    PT Treatment/Interventions Electrical Stimulation;Iontophoresis 49m/ml Dexamethasone;Aquatic Therapy;Cryotherapy;Moist Heat;Therapeutic activities;Patient/family education;Manual techniques;Balance training;Stair training;Gait training;Neuromuscular re-education;Therapeutic exercise;Passive range of motion    PT Next Visit Plan Continue LE Strengthening    PT Home Exercise Plan not yet established    Consulted and Agree with Plan of Care Patient;Family member/caregiver    Family Member Consulted Friend           Patient will benefit from skilled therapeutic intervention in order to improve the following deficits and impairments:  Abnormal gait, Pain, Decreased coordination, Decreased mobility, Increased muscle spasms, Decreased endurance, Decreased range of motion, Decreased strength, Decreased balance, Difficulty walking, Decreased activity tolerance, Cardiopulmonary status limiting activity, Dizziness  Visit Diagnosis: Muscle weakness (generalized)  Pain in right hip  Difficulty in walking, not elsewhere classified     Problem List Patient Active Problem List   Diagnosis Date Noted  . Malnutrition of moderate degree 11/08/2019  . COPD with  acute exacerbation ([place] 11/06/2019  . S/P laparoscopic cholecystectomy 10/30/19 11/06/2019  . CHF (congestive heart failure) ([place] 11/06/2019  . CKD (chronic kidney disease) stage 3, GFR 30-59 ml/min (HCC) 11/06/2019  . Acute on chronic systolic CHF (congestive heart failure) ([place] 11/06/2019  . Diastolic CHF ([place] 023/55/7322 . Acute respiratory failure with hypoxia ([place]   . Acute kidney injury superimposed on CKD (HL[place]   . Acute cholecystitis 08/31/2019  . RUQ pain   . Chronic obstructive pulmonary disease ([place]   . Acute DVT (deep venous thrombosis) ([place] 04/29/2018  . 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 Dementia due to medical condition without behavioral disturbance ([place] 05/04/2013  . AF (paroxysmal atrial fibrillation) ([place]   . Chronic systolic CHF (congestive heart failure) ([place]   . Osteoporosis, post-menopausal   . Osteoarthritis, multiple sites    3:58 PM, 04/28/20 BMargarito Liner SHosp Dr. [facility]et[facility] Coll Y TosteStudent Physical Therapist CAstoria BMargarito Liner10/05/2020, 3:49 PM  [facility] [facility] [address] [place] [address]Phone: [phone]  Fax:  [phone] Name: Diana BeechamMRN: [id number] of Birth: [birth year]

## 2020-05-05 ENCOUNTER — Other Ambulatory Visit: Payer: Self-pay

## 2020-05-05 ENCOUNTER — Ambulatory Visit: Payer: Medicare Other

## 2020-05-05 DIAGNOSIS — M6281 Muscle weakness (generalized): Secondary | ICD-10-CM | POA: Diagnosis not present

## 2020-05-05 DIAGNOSIS — M25551 Pain in right hip: Secondary | ICD-10-CM

## 2020-05-05 DIAGNOSIS — R262 Difficulty in walking, not elsewhere classified: Secondary | ICD-10-CM

## 2020-05-05 NOTE — Therapy (Signed)
[facility] [address], [place], [address] Phone: [phone]   Fax:  [phone]  Physical Therapy Treatment  Patient Details  Name: Diana Powell MRN: [id number] Date of Birth: 02-12-1927 Referring Provider (PT): Darylene Price, FNP   Encounter Date: 05/05/2020   PT End of Session - 05/05/20 1522    Visit Number 20    Number of Visits 24    Date for PT Re-Evaluation 05/20/20    Authorization Type MCR    Authorization Time Period 12/25/19-03/18/20    Authorization - Visit Number 16    Authorization - Number of Visits 16    PT Start Time 1520    PT Stop Time 1600    PT Time Calculation (min) 40 min    Equipment Utilized During Treatment Gait belt    Activity Tolerance Patient limited by fatigue;Patient tolerated treatment well;Treatment limited secondary to medical complications (Comment)    Behavior During Therapy [facility] for tasks assessed/performed           Past Medical History:  Diagnosis Date  . Atrial fibrillation ([place])   . CHF (congestive heart failure) ([place])   . Chronic systolic heart failure ([place])   . Complication of anesthesia    CONFUSION   . COPD (chronic obstructive pulmonary disease) ([place])   . Osteoarthritis, multiple sites   . Osteoporosis, post-menopausal   . Presence of permanent cardiac pacemaker     Past Surgical History:  Procedure Laterality Date  . CARDIOVERSION  ~2009  . HIP FRACTURE SURGERY Bilateral [phone]   each hip fractured at separate times  . INSERT / REPLACE / REMOVE PACEMAKER    . IR EXCHANGE BILIARY DRAIN  10/05/2019  . TOTAL HIP ARTHROPLASTY Right 04/20/2018   Procedure: TOTAL HIP CONVERSION;  Surgeon: Hessie Knows, MD;  Location: [facility] ORS;  Service: Orthopedics;  Laterality: Right;    There were no vitals filed for this visit.   Subjective Assessment - 05/05/20 1521    Subjective Patients son reports that shes doing good but has been tired today.     Patient is accompained by: Family member    Pertinent History ECHO 09/02/19: Moderate MVR, moderate TVR, mild AVR, EF: 45-50%. Recent Adjustments to apixaban and furosemide doses based on age/weight. Most recent Hb: 8.8 on iron supplementation, per PCP note down from baseline of 11.6 (09/14/19). Pt recently had a sleep study completed showing nocturnal hypoxia now on O2 over-night.    Limitations Lifting;Standing;Walking    How long can you sit comfortably? unlimited    How long can you stand comfortably? 15-20 minutes    How long can you walk comfortably? unknown, has been mostly performing household mobility since DC    Patient Stated Goals improved functional tolerance to AMB regarding breathing limitations    Currently in Pain? No/denies    Pain Onset More than a month ago           INTERVENTION   Therapeutic Exercise -Cardiovascular conditioning for ABLA; Nustep 2x5 min minutes LEVEL 4, Seat 7, Arms 6, SPM cued >55, vitals taken q29mn. (SpO2:94% HR:81 bpm), (SpO2:94% HR:71 bpm) -STS 2x10 from green chair (SpO2: 99% HR: 126bpm) -Ambulation using FWW 1x5052f(SpO2:96% HR:75 bpm; after ambulation)) -Forward Walking w/o AD: x10049fSpO2: 96% HR: 79bpm) -Stairs x4 (SpO2: 96% HR: 83bpm) -Forward/Backward Steps x8 B (SpO2: 97% HR: 75bpm) -Lateral Step x10 B x25f66fSpO2: 97% HR: 85bpm)   Performed Exercises to improve function capacity  PT Education - 05/05/20 1522    Education Details Form/Technique with Exercises    Person(s) Educated Patient    Methods Explanation;Demonstration    Comprehension Verbalized understanding;Returned demonstration            PT Short Term Goals - 12/25/19 1957      PT SHORT TERM GOAL #1   Title After 4 weeks pt will tolerate 4 minutes sustained AMB c gait speed > 0.15ms to improve capacity to perform safe household distance AMB.    Baseline At evaluation 12/25/19: 3 minute tolerance at 0.664m c RW    Time 4    Period Weeks    Status New     Target Date 01/22/20      PT SHORT TERM GOAL #2   Title After 4 weeks pt to demonstrate improved 5xSTS from 19" surface, hands free in <16sec.    Baseline At eval 12/25/19: 20sec from chair with hand push-off.    Time 4    Period Weeks    Status New    Target Date 01/22/20             PT Long Term Goals - 03/25/20 1616      PT LONG TERM GOAL #1   Title After 8 weeks pt to demonstrate ability to perform 5 continuous minutes AMB @ >0.6043mc RW. (590f24f 5min5ms)    Baseline 375ft;39f3/2020: 600ft; 21f1/2020: 500ft; 095f/2021: at eval: 3 minutes only or 300ft.; 080f/21: 416 in 5 minutes    Time 8    Period Weeks    Status Partially Met      PT LONG TERM GOAL #2   Title After 8 weeks pt will demonstrate improve SLS >10sec BILAT at supervision level, no UE for recovery.    Baseline At eval 12/25/19: RLE ~3sec; LLE ~1sec; 03/25/20: RLE ~5sec  LLE ~5sec    Time 8    Period Weeks    Status New      PT LONG TERM GOAL #3   Title At 12 weeks pt will tolerate 15 minutes moderate aerobic actiivty on recumbent stepper with supplemental O2 PRN to prepar for community based exercise compliance at DC.    Baseline Not performing; 03/25/20: 10 minutes    Time 12    Period Weeks    Status On-going      PT LONG TERM GOAL #4   Title In 12 weeks patient will improve 5 x STS to under 12sec to decrease fall risk and improve LE functional strength.    Baseline 5xSTS: 27 sec; 05/22/2019: 20 sec; 06/19/2019: 20sec; 08/01/2019: Eval 12/25/19: 20sec; 03/25/20: 13.5 sec    Time 12    Period Weeks    Status New                 Plan - 05/05/20 1524    Clinical Impression Statement Patient's endurance is showing progress demonstrated by patient's ambulation distance before resting.  Patient's LE strength remains limited and was addressed during session. Patient was not able to perform same amount of exercise volume during today's session due to tiredness.  Patient will continue to benefit from  skilled therapy to return to prior level of function and progress towards goals.    Personal Factors and Comorbidities Comorbidity 2;Past/Current Experience;Other    Comorbidities CHF, DVT, THA, CVA, "lung disorder"    Examination-Activity Limitations Carry;Lift;Locomotion Level;Stand;Stairs;Bend;Bathing;Dressing    Examination-Participation Restrictions Cleaning;Community Activity;Meal Prep    Stability/Clinical Decision Making Evolving/Moderate complexity  Clinical Decision Making Moderate    Rehab Potential Fair    Clinical Impairments Affecting Rehab Potential (+) highly motivated, family support (-) age, CHF, lack of opportunies for physical activity    PT Frequency 2x / week    PT Duration 12 weeks    PT Treatment/Interventions Electrical Stimulation;Iontophoresis 84m/ml Dexamethasone;Aquatic Therapy;Cryotherapy;Moist Heat;Therapeutic activities;Patient/family education;Manual techniques;Balance training;Stair training;Gait training;Neuromuscular re-education;Therapeutic exercise;Passive range of motion    PT Next Visit Plan Continue LE Strengthening    PT Home Exercise Plan not yet established    Consulted and Agree with Plan of Care Patient;Family member/caregiver    Family Member Consulted Friend           Patient will benefit from skilled therapeutic intervention in order to improve the following deficits and impairments:  Abnormal gait, Pain, Decreased coordination, Decreased mobility, Increased muscle spasms, Decreased endurance, Decreased range of motion, Decreased strength, Decreased balance, Difficulty walking, Decreased activity tolerance, Cardiopulmonary status limiting activity, Dizziness  Visit Diagnosis: Muscle weakness (generalized)  Pain in right hip  Difficulty in walking, not elsewhere classified     Problem List Patient Active Problem List   Diagnosis Date Noted  . Malnutrition of moderate degree 11/08/2019  . COPD with acute exacerbation (HPleasant Hill  11/06/2019  . S/P laparoscopic cholecystectomy 10/30/19 11/06/2019  . CHF (congestive heart failure) (HFin[place] 11/06/2019  . CKD (chronic kidney disease) stage 3, GFR 30-59 ml/min (HCC) 11/06/2019  . Acute on chronic systolic CHF (congestive heart failure) (HValle[place] 11/06/2019  . Diastolic CHF (HRadersburg 023/76/2831 . Acute respiratory failure with hypoxia (HFairview   . Acute kidney injury superimposed on CKD (HArmstrong   . Acute cholecystitis 08/31/2019  . RUQ pain   . Chronic obstructive pulmonary disease (HPort Clarence   . Acute DVT (deep venous thrombosis) (HMes[place] 04/29/2018  . 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 Dementia due to medical condition without behavioral disturbance (HMor[place] 05/04/2013  . AF (paroxysmal atrial fibrillation) (H[place]   . Chronic systolic CHF (congestive heart failure) (HWest[place]   . Osteoporosis, post-menopausal   . Osteoarthritis, multiple sites    3:58 PM, 05/05/20 BMargarito Liner SAdventist Health And Rideout Memorial HospitalStudent Physical Therapist CGonzalez BMargarito Liner10/18/2021, 3:50 PM  [facility] [address] [place] [address]Phone: [phone]  Fax:  [phone] Name: Diana PollettMRN: [id number] of Birth: 814-Jun-1928

## 2020-05-12 ENCOUNTER — Ambulatory Visit: Payer: Medicare Other

## 2020-05-12 ENCOUNTER — Other Ambulatory Visit: Payer: Self-pay

## 2020-05-12 DIAGNOSIS — M25551 Pain in right hip: Secondary | ICD-10-CM

## 2020-05-12 DIAGNOSIS — R262 Difficulty in walking, not elsewhere classified: Secondary | ICD-10-CM

## 2020-05-12 DIAGNOSIS — M6281 Muscle weakness (generalized): Secondary | ICD-10-CM | POA: Diagnosis not present

## 2020-05-12 NOTE — Therapy (Cosign Needed)
[facility] [address], [place], [address] Phone: [phone]   Fax:  [phone]  Physical Therapy Treatment  Patient Details  Name: Diana Powell MRN: [id number] Date of Birth: Apr 11, 1927 Referring Provider (PT): Darylene Price, FNP   Encounter Date: 05/12/2020   PT End of Session - 05/12/20 1429    Visit Number 21    Number of Visits 24    Date for PT Re-Evaluation 05/20/20    Authorization Type MCR    Authorization Time Period 12/25/19-03/18/20    Authorization - Visit Number 81    Authorization - Number of Visits 16    PT Start Time 1427    PT Stop Time 1512    PT Time Calculation (min) 45 min    Equipment Utilized During Treatment Gait belt    Activity Tolerance Patient limited by fatigue;Patient tolerated treatment well;Treatment limited secondary to medical complications (Comment)    Behavior During Therapy [facility] for tasks assessed/performed           Past Medical History:  Diagnosis Date  . Atrial fibrillation ([place])   . CHF (congestive heart failure) ([place])   . Chronic systolic heart failure ([place])   . Complication of anesthesia    CONFUSION   . COPD (chronic obstructive pulmonary disease) ([place])   . Osteoarthritis, multiple sites   . Osteoporosis, post-menopausal   . Presence of permanent cardiac pacemaker     Past Surgical History:  Procedure Laterality Date  . CARDIOVERSION  ~2009  . HIP FRACTURE SURGERY Bilateral [phone]   each hip fractured at separate times  . INSERT / REPLACE / REMOVE PACEMAKER    . IR EXCHANGE BILIARY DRAIN  10/05/2019  . TOTAL HIP ARTHROPLASTY Right 04/20/2018   Procedure: TOTAL HIP CONVERSION;  Surgeon: Hessie Knows, MD;  Location: [facility] ORS;  Service: Orthopedics;  Laterality: Right;    There were no vitals filed for this visit.   Subjective Assessment - 05/12/20 1427    Subjective Patients son reports that shes doing good but has been needing to use oxygen  at home.    Patient is accompained by: Family member    Pertinent History ECHO 09/02/19: Moderate MVR, moderate TVR, mild AVR, EF: 45-50%. Recent Adjustments to apixaban and furosemide doses based on age/weight. Most recent Hb: 8.8 on iron supplementation, per PCP note down from baseline of 11.6 (09/14/19). Pt recently had a sleep study completed showing nocturnal hypoxia now on O2 over-night.    Limitations Lifting;Standing;Walking    How long can you sit comfortably? unlimited    How long can you stand comfortably? 15-20 minutes    How long can you walk comfortably? unknown, has been mostly performing household mobility since DC    Patient Stated Goals improved functional tolerance to AMB regarding breathing limitations    Currently in Pain? No/denies    Pain Onset More than a month ago          INTERVENTION   Therapeutic Exercise -Cardiovascular conditioning for ABLA; Nustep 2x5 min minutes LEVEL 4, Seat 7, Arms 6, SPM cued >55, vitals taken q41mn. (SpO2:95% HR:75 bpm), (SpO2:95% HR:70 bpm) -STS 2x10 from green chair (SpO2: 95% HR: 129bpm) (SpO2: 96% HR: 128bpm) -Ambulation using FWW x5027f x60050fSpO2:98% HR:75 bpm; after ambulation)) -Stepping using 4 Square with UE Support by Therapist (forward, back, lateral) x5 CW and CCW -Stairs x4 using bilat handrail with step through pattern  (SpO2: 95% HR: 81bpm)   Performed Exercises to  improve function capacity   Improved exercise technique, movement at target joints, use of target muscles after min to mod verbal, visual, tactile cues.      PT Education - 05/12/20 1429    Education Details Form/Technique with Exercises    Person(s) Educated Patient    Methods Explanation;Demonstration    Comprehension Verbalized understanding;Returned demonstration            PT Short Term Goals - 12/25/19 1957      PT SHORT TERM GOAL #1   Title After 4 weeks pt will tolerate 4 minutes sustained AMB c gait speed > 0.18ms to improve capacity  to perform safe household distance AMB.    Baseline At evaluation 12/25/19: 3 minute tolerance at 0.642m c RW    Time 4    Period Weeks    Status New    Target Date 01/22/20      PT SHORT TERM GOAL #2   Title After 4 weeks pt to demonstrate improved 5xSTS from 19" surface, hands free in <16sec.    Baseline At eval 12/25/19: 20sec from chair with hand push-off.    Time 4    Period Weeks    Status New    Target Date 01/22/20             PT Long Term Goals - 05/12/20 1443      PT LONG TERM GOAL #1   Title After 8 weeks pt to demonstrate ability to perform 5 continuous minutes AMB @ >0.604mc RW. (590f64f 5min57ms)    Baseline 375ft;88f3/2020: 600ft; 30f1/2020: 500ft; 02f/2021: at eval: 3 minutes only or 300ft.; 062f/21: 416 in 5 minutes    Time 8    Period Weeks    Status Partially Met      PT LONG TERM GOAL #2   Title After 8 weeks pt will demonstrate improve SLS >10sec BILAT at supervision level, no UE for recovery.    Baseline At eval 12/25/19: RLE ~3sec; LLE ~1sec; 03/25/20: RLE ~5sec  LLE ~5sec    Time 8    Period Weeks    Status New      PT LONG TERM GOAL #3   Title At 12 weeks pt will tolerate 15 minutes moderate aerobic actiivty on recumbent stepper with supplemental O2 PRN to prepar for community based exercise compliance at DC.    Baseline Not performing; 03/25/20: 10 minutes    Time 12    Period Weeks    Status On-going      PT LONG TERM GOAL #4   Title In 12 weeks patient will improve 5 x STS to under 12sec to decrease fall risk and improve LE functional strength.    Baseline 5xSTS: 27 sec; 05/22/2019: 20 sec; 06/19/2019: 20sec; 08/01/2019: Eval 12/25/19: 20sec; 03/25/20: 13.5 sec    Time 12    Period Weeks    Status New                 Plan - 05/12/20 1446    Clinical Impression Statement Continued to address patients LE strength, endurance, and balance during session. Patient was more fatigued than usual after performing NuStep and required more  rest breaks throughout session. Decreased exercise volume during today's session due to fatigue.  Deferred assessing goals until next session due to patient fatigue and not doing well at home the past couple days per son report.  Patient will benefit from skilled therapy to return to prior level of function and progress  towards remaining goals.    Personal Factors and Comorbidities Comorbidity 2;Past/Current Experience;Other    Comorbidities CHF, DVT, THA, CVA, "lung disorder"    Examination-Activity Limitations Carry;Lift;Locomotion Level;Stand;Stairs;Bend;Bathing;Dressing    Examination-Participation Restrictions Cleaning;Community Activity;Meal Prep    Stability/Clinical Decision Making Evolving/Moderate complexity    Rehab Potential Fair    Clinical Impairments Affecting Rehab Potential (+) highly motivated, family support (-) age, CHF, lack of opportunies for physical activity    PT Frequency 2x / week    PT Duration 12 weeks    PT Treatment/Interventions Electrical Stimulation;Iontophoresis 73m/ml Dexamethasone;Aquatic Therapy;Cryotherapy;Moist Heat;Therapeutic activities;Patient/family education;Manual techniques;Balance training;Stair training;Gait training;Neuromuscular re-education;Therapeutic exercise;Passive range of motion    PT Next Visit Plan Continue LE Strengthening    PT Home Exercise Plan not yet established    Consulted and Agree with Plan of Care Patient;Family member/caregiver    Family Member Consulted Friend           Patient will benefit from skilled therapeutic intervention in order to improve the following deficits and impairments:  Abnormal gait, Pain, Decreased coordination, Decreased mobility, Increased muscle spasms, Decreased endurance, Decreased range of motion, Decreased strength, Decreased balance, Difficulty walking, Decreased activity tolerance, Cardiopulmonary status limiting activity, Dizziness  Visit Diagnosis: Muscle weakness (generalized)  Pain in  right hip  Difficulty in walking, not elsewhere classified     Problem List Patient Active Problem List   Diagnosis Date Noted  . Malnutrition of moderate degree 11/08/2019  . COPD with acute exacerbation ([place] 11/06/2019  . S/P laparoscopic cholecystectomy 10/30/19 11/06/2019  . CHF (congestive heart failure) ([place] 11/06/2019  . CKD (chronic kidney disease) stage 3, GFR 30-59 ml/min (HCC) 11/06/2019  . Acute on chronic systolic CHF (congestive heart failure) ([place] 11/06/2019  . Diastolic CHF ([place] 036/68/1594 . Acute respiratory failure with hypoxia ([place]   . Acute kidney injury superimposed on CKD ([place]   . Acute cholecystitis 08/31/2019  . RUQ pain   . Chronic obstructive pulmonary disease ([place]   . Acute DVT (deep venous thrombosis) ([place] 04/29/2018  . 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 Dementia due to medical condition without behavioral disturbance ([place] 05/04/2013  . AF (paroxysmal atrial fibrillation) ([place]   . Chronic systolic CHF (congestive heart failure) ([place]   . Osteoporosis, post-menopausal   . Osteoarthritis, multiple sites    4:34 PM, 05/12/20 BMargarito Liner SPT Student Physical Therapist [place] [phone] Student physical therapist under direct supervision of licensed physical therapist during the entirety of the session.   MJoneen BoersPT, DPT   05/12/2020, 4:34 PM  [facility] [address] [place] [address]Phone: [phone]  Fax:  [phone] Name: Diana EcklundMRN: [id number] of Birth: [birth year]

## 2020-05-14 NOTE — Addendum Note (Signed)
Addended by: Bethanie Dicker on: 05/14/2020 02:02 PM   Modules accepted: Orders

## 2020-05-19 ENCOUNTER — Other Ambulatory Visit: Payer: Self-pay

## 2020-05-19 ENCOUNTER — Ambulatory Visit: Payer: Medicare Other | Attending: Geriatric Medicine

## 2020-05-19 DIAGNOSIS — R262 Difficulty in walking, not elsewhere classified: Secondary | ICD-10-CM | POA: Diagnosis present

## 2020-05-19 DIAGNOSIS — M25551 Pain in right hip: Secondary | ICD-10-CM

## 2020-05-19 DIAGNOSIS — M6281 Muscle weakness (generalized): Secondary | ICD-10-CM | POA: Diagnosis not present

## 2020-05-19 NOTE — Therapy (Signed)
[facility] [address] [place], [address] Phone: [phone]   Fax:  [phone]  Physical Therapy Treatment/Progress Note  Patient Details  Name: Diana Powell MRN: [id number] Date of Birth: 1926-12-12 Referring Provider (PT): Clarisa Kindred, FNP   Encounter Date: 05/19/2020   PT End of Session - 05/19/20 1431    Visit Number 22    Number of Visits 24    Date for PT Re-Evaluation 05/20/20    Authorization Type MCR    Authorization Time Period 12/25/19-03/18/20    Authorization - Visit Number 17    Authorization - Number of Visits 16    PT Start Time 1430    PT Stop Time 1515    PT Time Calculation (min) 45 min    Equipment Utilized During Treatment Gait belt    Activity Tolerance Patient limited by fatigue;Patient tolerated treatment well;Treatment limited secondary to medical complications (Comment)    Behavior During Therapy [facility] for tasks assessed/performed           Past Medical History:  Diagnosis Date  . Atrial fibrillation (HCC)   . CHF (congestive heart failure) (HCC)   . Chronic systolic heart failure (HCC)   . Complication of anesthesia    CONFUSION   . COPD (chronic obstructive pulmonary disease) (HCC)   . Osteoarthritis, multiple sites   . Osteoporosis, post-menopausal   . Presence of permanent cardiac pacemaker     Past Surgical History:  Procedure Laterality Date  . CARDIOVERSION  ~2009  . HIP FRACTURE SURGERY Bilateral [phone]   each hip fractured at separate times  . INSERT / REPLACE / REMOVE PACEMAKER    . IR EXCHANGE BILIARY DRAIN  10/05/2019  . TOTAL HIP ARTHROPLASTY Right 04/20/2018   Procedure: TOTAL HIP CONVERSION;  Surgeon: Kennedy Bucker, MD;  Location: [facility] ORS;  Service: Orthopedics;  Laterality: Right;    There were no vitals filed for this visit.   Subjective Assessment - 05/19/20 1430    Subjective Patient reports shes been doing ok.    Patient is accompained  by: Family member    Pertinent History ECHO 09/02/19: Moderate MVR, moderate TVR, mild AVR, EF: 45-50%. Recent Adjustments to apixaban and furosemide doses based on age/weight. Most recent Hb: 8.8 on iron supplementation, per PCP note down from baseline of 11.6 (09/14/19). Pt recently had a sleep study completed showing nocturnal hypoxia now on O2 over-night.    Limitations Lifting;Standing;Walking    How long can you sit comfortably? unlimited    How long can you stand comfortably? 15-20 minutes    How long can you walk comfortably? unknown, has been mostly performing household mobility since DC    Patient Stated Goals improved functional tolerance to AMB regarding breathing limitations    Currently in Pain? No/denies    Pain Onset More than a month ago           Therapeutic Exercise -NuStep 10 minutes (Level 6 Seat and Handle)  -5xSTS 11.85sec -677ft 4:35 minutes -SLS RLE: 8sec/LLE 9sec -Stairs x4 (ascending/descending)  -Lateral Steps x15 B -Forward/Backward Step x10  -Ambulation with FWW x670ft     PT Education - 05/19/20 1431    Education Details Form/Technique with Exercise    Person(s) Educated Patient    Methods Explanation;Demonstration    Comprehension Verbalized understanding;Returned demonstration            PT Short Term Goals - 05/19/20 1458      PT SHORT TERM  GOAL #1   Title After 4 weeks pt will tolerate 4 minutes sustained AMB c gait speed > 0.69m/s to improve capacity to perform safe household distance AMB.    Baseline At evaluation 12/25/19: 3 minute tolerance at 0.35m/s c RW; 05/19/20: 616ft 4:35 min    Time 4    Period Weeks    Status Achieved    Target Date 01/22/20      PT SHORT TERM GOAL #2   Title After 4 weeks pt to demonstrate improved 5xSTS from 19" surface, hands free in <16sec.    Baseline At eval 12/25/19: 20sec from chair with hand push-off.: 05/19/20: 17.5 in 11.85 sec    Time 4    Period Weeks    Status Achieved    Target Date 01/22/20              PT Long Term Goals - 05/19/20 1432      PT LONG TERM GOAL #1   Title After 8 weeks pt to demonstrate ability to perform 5 continuous minutes AMB @ >0.41m/s c RW. (585ft in )    Baseline 387ft; 05/22/2019: 626ft; 06/19/2019: 528ft; 08/01/2019: at eval: 3 minutes only or 350ft.; 03/25/20: 416 in 5 minutes; 05/19/20: 671ft in 4:35 min    Time 8    Period Weeks    Status Achieved      PT LONG TERM GOAL #2   Title After 8 weeks pt will demonstrate improve SLS >10sec BILAT at supervision level, no UE for recovery.    Baseline At eval 12/25/19: RLE ~3sec; LLE ~1sec; 03/25/20: RLE ~5sec  LLE ~5sec; 05/19/20: RLE 8sec  LLE 9sec    Time 8    Period Weeks    Status On-going      PT LONG TERM GOAL #3   Title At 12 weeks pt will tolerate 15 minutes moderate aerobic actiivty on recumbent stepper with supplemental O2 PRN to prepar for community based exercise compliance at DC.    Baseline Not performing; 03/25/20: 10 minutes    Time 12    Period Weeks    Status Achieved      PT LONG TERM GOAL #4   Title In 12 weeks patient will improve 5 x STS to under 12sec to decrease fall risk and improve LE functional strength.    Baseline 5xSTS: 27 sec; 05/22/2019: 20 sec; 06/19/2019: 20sec; 08/01/2019: Eval 12/25/19: 20sec; 03/25/20: 13.5 sec; 05/19/20: 11.85sec    Time 12    Period Weeks    Status Achieved                 Plan - 05/19/20 1500    Clinical Impression Statement Patient is improving demonstrated by improving her 5xSTS meeting her goal of 12sec or less, walking at a speed of 0.64m/s for 5 minutes showing an improvement in her cardiopulmonary system and muscular endurance. SLS remains difficult for patient and will continue to be addressed in further sessions along with cardiovascular endurance to improve community ambulation.  Patient will benefit from skilled therapy to return to prior level of function.    Personal Factors and Comorbidities Comorbidity 2;Past/Current  Experience;Other    Comorbidities CHF, DVT, THA, CVA, "lung disorder"    Examination-Activity Limitations Carry;Lift;Locomotion Level;Stand;Stairs;Bend;Bathing;Dressing    Examination-Participation Restrictions Cleaning;Community Activity;Meal Prep    Stability/Clinical Decision Making Evolving/Moderate complexity    Clinical Decision Making Moderate    Rehab Potential Fair    Clinical Impairments Affecting Rehab Potential (+) highly motivated, family support (-) age,  CHF, lack of opportunies for physical activity    PT Frequency 2x / week    PT Duration 12 weeks    PT Treatment/Interventions Electrical Stimulation;Iontophoresis 4mg /ml Dexamethasone;Aquatic Therapy;Cryotherapy;Moist Heat;Therapeutic activities;Patient/family education;Manual techniques;Balance training;Stair training;Gait training;Neuromuscular re-education;Therapeutic exercise;Passive range of motion    PT Next Visit Plan Continue LE Strengthening    PT Home Exercise Plan not yet established    Consulted and Agree with Plan of Care Patient;Family member/caregiver    Family Member Consulted Son           Patient will benefit from skilled therapeutic intervention in order to improve the following deficits and impairments:  Abnormal gait, Pain, Decreased coordination, Decreased mobility, Increased muscle spasms, Decreased endurance, Decreased range of motion, Decreased strength, Decreased balance, Difficulty walking, Decreased activity tolerance, Cardiopulmonary status limiting activity, Dizziness  Visit Diagnosis: Muscle weakness (generalized)  Pain in right hip  Difficulty in walking, not elsewhere classified     Problem List Patient Active Problem List   Diagnosis Date Noted  . Malnutrition of moderate degree 11/08/2019  . COPD with acute exacerbation (HCC) 11/06/2019  . S/P laparoscopic cholecystectomy 10/30/19 11/06/2019  . CHF (congestive heart failure) (HCC) 11/06/2019  . CKD (chronic kidney disease) stage  3, GFR 30-59 ml/min (HCC) 11/06/2019  . Acute on chronic systolic CHF (congestive heart failure) (HCC) 11/06/2019  . Diastolic CHF (HCC) 11/06/2019  . Acute respiratory failure with hypoxia (HCC)   . Acute kidney injury superimposed on CKD (HCC)   . Acute cholecystitis 08/31/2019  . RUQ pain   . Chronic obstructive pulmonary disease (HCC)   . Acute DVT (deep venous thrombosis) (HCC) 04/29/2018  . 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 AF (paroxysmal atrial fibrillation) (HCC)   . Chronic systolic CHF (congestive heart failure) (HCC)   . Osteoporosis, post-menopausal   . Osteoarthritis, multiple sites     05/06/2013 05/19/2020, 3:01 PM  [facility] [address], [address], [place] Phone: [phone]   Fax:  [phone]  Name: Diana Powell MRN: [id number] Date of Birth: [birth year]

## 2020-05-28 ENCOUNTER — Other Ambulatory Visit: Payer: Self-pay

## 2020-05-28 ENCOUNTER — Ambulatory Visit: Payer: Medicare Other

## 2020-05-28 DIAGNOSIS — M25551 Pain in right hip: Secondary | ICD-10-CM

## 2020-05-28 DIAGNOSIS — M6281 Muscle weakness (generalized): Secondary | ICD-10-CM

## 2020-05-28 DIAGNOSIS — R262 Difficulty in walking, not elsewhere classified: Secondary | ICD-10-CM

## 2020-05-28 NOTE — Therapy (Signed)
[facility]ICINE [address] [place], [address] Phone: [phone]   Fax:  [phone]  Physical Therapy Treatment  Patient Details  Name: Diana Powell MRN: [id number] Date of Birth: 1926/11/22 Referring Provider (PT): Clarisa Kindred, FNP   Encounter Date: 05/28/2020   PT End of Session - 05/28/20 1305    Visit Number 23    Number of Visits 24    Date for PT Re-Evaluation 05/20/20    Authorization Type MCR    Authorization Time Period 12/25/19-03/18/20    Authorization - Visit Number 17    Authorization - Number of Visits 16    PT Start Time 1300    PT Stop Time 1345    PT Time Calculation (min) 45 min    Equipment Utilized During Treatment Gait belt    Activity Tolerance Patient limited by fatigue;Patient tolerated treatment well;Treatment limited secondary to medical complications (Comment)    Behavior During Therapy [facility] for tasks assessed/performed           Past Medical History:  Diagnosis Date  . Atrial fibrillation (HCC)   . CHF (congestive heart failure) (HCC)   . Chronic systolic heart failure (HCC)   . Complication of anesthesia    CONFUSION   . COPD (chronic obstructive pulmonary disease) (HCC)   . Osteoarthritis, multiple sites   . Osteoporosis, post-menopausal   . Presence of permanent cardiac pacemaker     Past Surgical History:  Procedure Laterality Date  . CARDIOVERSION  ~2009  . HIP FRACTURE SURGERY Bilateral [phone]   each hip fractured at separate times  . INSERT / REPLACE / REMOVE PACEMAKER    . IR EXCHANGE BILIARY DRAIN  10/05/2019  . TOTAL HIP ARTHROPLASTY Right 04/20/2018   Procedure: TOTAL HIP CONVERSION;  Surgeon: Kennedy Bucker, MD;  Location: [facility] ORS;  Service: Orthopedics;  Laterality: Right;    There were no vitals filed for this visit.   Subjective Assessment - 05/28/20 1303    Subjective Patient son reports shes doing ok today and has some mild knee pain  bilaterally.    Patient is accompained by: Family member    Pertinent History ECHO 09/02/19: Moderate MVR, moderate TVR, mild AVR, EF: 45-50%. Recent Adjustments to apixaban and furosemide doses based on age/weight. Most recent Hb: 8.8 on iron supplementation, per PCP note down from baseline of 11.6 (09/14/19). Pt recently had a sleep study completed showing nocturnal hypoxia now on O2 over-night.    Limitations Lifting;Standing;Walking    How long can you sit comfortably? unlimited    How long can you stand comfortably? 15-20 minutes    How long can you walk comfortably? unknown, has been mostly performing household mobility since DC    Patient Stated Goals improved functional tolerance to AMB regarding breathing limitations    Currently in Pain? No/denies    Pain Onset More than a month ago             PT Education - 05/28/20 1304    Education Details Form/Technique with Exercise    Person(s) Educated Patient    Methods Explanation;Demonstration    Comprehension Verbalized understanding;Returned demonstration          INTERVENTION   Therapeutic Exercise -Cardiovascular conditioning for ABLA; Nustep 2x5 min minutes LEVEL 4, Seat 6, Arms 6, SPM cued >55, vitals taken q75min. (SpO2:95% HR:70 bpm), (SpO2:94% HR:69 bpm) -STS 2x10 from green chair (SpO2: 95% HR: 123bpm) (SpO2: 97% HR: 128bpm) -Standing Marching with UE  Support 2x20 (SpO2: 96% HR: 75bpm) -Ambulation using FWW x517ft (SpO2:95% HR:88 bpm; after ambulation)) -Standing Hip Abduction with UE 2x6 Bilat (SpO2: 96% HR: 76bpm)   Performed Exercises to improve function capacity      PT Short Term Goals - 05/19/20 1458      PT SHORT TERM GOAL #1   Title After 4 weeks pt will tolerate 4 minutes sustained AMB c gait speed > 0.50m/s to improve capacity to perform safe household distance AMB.    Baseline At evaluation 12/25/19: 3 minute tolerance at 0.76m/s c RW; 05/19/20: 643ft 4:35 min    Time 4    Period Weeks    Status  Achieved    Target Date 01/22/20      PT SHORT TERM GOAL #2   Title After 4 weeks pt to demonstrate improved 5xSTS from 19" surface, hands free in <16sec.    Baseline At eval 12/25/19: 20sec from chair with hand push-off.: 05/19/20: 17.5 in 11.85 sec    Time 4    Period Weeks    Status Achieved    Target Date 01/22/20             PT Long Term Goals - 05/19/20 1432      PT LONG TERM GOAL #1   Title After 8 weeks pt to demonstrate ability to perform 5 continuous minutes AMB @ >0.68m/s c RW. (569ft in )    Baseline 37ft; 05/22/2019: 617ft; 06/19/2019: 561ft; 08/01/2019: at eval: 3 minutes only or 347ft.; 03/25/20: 416 in 5 minutes; 05/19/20: 646ft in 4:35 min    Time 8    Period Weeks    Status Achieved      PT LONG TERM GOAL #2   Title After 8 weeks pt will demonstrate improve SLS >10sec BILAT at supervision level, no UE for recovery.    Baseline At eval 12/25/19: RLE ~3sec; LLE ~1sec; 03/25/20: RLE ~5sec  LLE ~5sec; 05/19/20: RLE 8sec  LLE 9sec    Time 8    Period Weeks    Status On-going      PT LONG TERM GOAL #3   Title At 12 weeks pt will tolerate 15 minutes moderate aerobic actiivty on recumbent stepper with supplemental O2 PRN to prepar for community based exercise compliance at DC.    Baseline Not performing; 03/25/20: 10 minutes    Time 12    Period Weeks    Status Achieved      PT LONG TERM GOAL #4   Title In 12 weeks patient will improve 5 x STS to under 12sec to decrease fall risk and improve LE functional strength.    Baseline 5xSTS: 27 sec; 05/22/2019: 20 sec; 06/19/2019: 20sec; 08/01/2019: Eval 12/25/19: 20sec; 03/25/20: 13.5 sec; 05/19/20: 11.85sec    Time 12    Period Weeks    Status Achieved                 Plan - 05/28/20 1349    Clinical Impression Statement Continued to monitor patient SpO2 throughout session at which it stayed above appropriate range; however, patient continues to have mild episodes of SOB and muscular fatigue throughout session.   Patient ambulated shorter distance compared to previous sessions due to mild pain in bilat. Knees.  Patient tolerated addition of exercises will brief episodes of SLS well.  Patient will continue to benefit from skilled therapy to return to prior level of function.    Personal Factors and Comorbidities Comorbidity 2;Past/Current Experience;Other    Comorbidities CHF,  DVT, THA, CVA, "lung disorder"    Examination-Activity Limitations Carry;Lift;Locomotion Level;Stand;Stairs;Bend;Bathing;Dressing    Examination-Participation Restrictions Cleaning;Community Activity;Meal Prep    Stability/Clinical Decision Making Evolving/Moderate complexity    Clinical Decision Making Moderate    Rehab Potential Fair    Clinical Impairments Affecting Rehab Potential (+) highly motivated, family support (-) age, CHF, lack of opportunies for physical activity    PT Frequency 2x / week    PT Duration 12 weeks    PT Treatment/Interventions Electrical Stimulation;Iontophoresis 4mg /ml Dexamethasone;Aquatic Therapy;Cryotherapy;Moist Heat;Therapeutic activities;Patient/family education;Manual techniques;Balance training;Stair training;Gait training;Neuromuscular re-education;Therapeutic exercise;Passive range of motion    PT Next Visit Plan Continue LE Strengthening/endurance    PT Home Exercise Plan not yet established    Consulted and Agree with Plan of Care Patient;Family member/caregiver    Family Member Consulted Son           Patient will benefit from skilled therapeutic intervention in order to improve the following deficits and impairments:  Abnormal gait, Pain, Decreased coordination, Decreased mobility, Increased muscle spasms, Decreased endurance, Decreased range of motion, Decreased strength, Decreased balance, Difficulty walking, Decreased activity tolerance, Cardiopulmonary status limiting activity, Dizziness  Visit Diagnosis: Muscle weakness (generalized)  Pain in right hip  Difficulty in walking,  not elsewhere classified     Problem List Patient Active Problem List   Diagnosis Date Noted  . Malnutrition of moderate degree 11/08/2019  . COPD with acute exacerbation (HCC) 11/06/2019  . S/P laparoscopic cholecystectomy 10/30/19 11/06/2019  . CHF (congestive heart failure) (HCC) 11/06/2019  . CKD (chronic kidney disease) stage 3, GFR 30-59 ml/min (HCC) 11/06/2019  . Acute on chronic systolic CHF (congestive heart failure) (HCC) 11/06/2019  . Diastolic CHF (HCC) 11/06/2019  . Acute respiratory failure with hypoxia (HCC)   . Acute kidney injury superimposed on CKD (HCC)   . Acute cholecystitis 08/31/2019  . RUQ pain   . Chronic obstructive pulmonary disease (HCC)   . Acute DVT (deep venous thrombosis) (HCC) 04/29/2018  . 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 AF (paroxysmal atrial fibrillation) (HCC)   . Chronic systolic CHF (congestive heart failure) (HCC)   . Osteoporosis, post-menopausal   . Osteoarthritis, multiple sites    1:51 PM, 05/28/20 13/10/21, [facility] Student Physical Therapist [place]  [phone]  [phone] 05/28/2020, 1:50 PM  [facility] SPORTS MEDICINE [address], [place] Phone: [phone]   Fax:  [phone]  Name: Diana Powell MRN: Genia Hotter Date of Birth: [birth year]

## 2020-06-03 ENCOUNTER — Other Ambulatory Visit: Payer: Self-pay

## 2020-06-03 ENCOUNTER — Ambulatory Visit: Payer: Medicare Other

## 2020-06-03 DIAGNOSIS — R262 Difficulty in walking, not elsewhere classified: Secondary | ICD-10-CM

## 2020-06-03 DIAGNOSIS — M6281 Muscle weakness (generalized): Secondary | ICD-10-CM | POA: Diagnosis not present

## 2020-06-03 DIAGNOSIS — M25551 Pain in right hip: Secondary | ICD-10-CM

## 2020-06-03 NOTE — Therapy (Signed)
[place] [facility]ORTS MEDICINE [address] [place], [address] Phone: [phone]   Fax:  [phone]  Physical Therapy Treatment  Patient Details  Name: Diana Powell MRN: [id number] Date of Birth: 24-Jun-1927 Referring Provider (PT): Clarisa Kindred, FNP   Encounter Date: 06/03/2020   PT End of Session - 06/03/20 1432    Visit Number 24    Number of Visits 24    Date for PT Re-Evaluation 07/14/20    Authorization Type MCR    Authorization Time Period 12/25/19-03/18/20    Authorization - Visit Number 17    Authorization - Number of Visits 16    PT Start Time 1430    PT Stop Time 1512    PT Time Calculation (min) 42 min    Equipment Utilized During Treatment Gait belt    Activity Tolerance Patient limited by fatigue;Patient tolerated treatment well;Treatment limited secondary to medical complications (Comment)    Behavior During Therapy [facility] for tasks assessed/performed           Past Medical History:  Diagnosis Date  . Atrial fibrillation (HCC)   . CHF (congestive heart failure) (HCC)   . Chronic systolic heart failure (HCC)   . Complication of anesthesia    CONFUSION   . COPD (chronic obstructive pulmonary disease) (HCC)   . Osteoarthritis, multiple sites   . Osteoporosis, post-menopausal   . Presence of permanent cardiac pacemaker     Past Surgical History:  Procedure Laterality Date  . CARDIOVERSION  ~2009  . HIP FRACTURE SURGERY Bilateral [phone]   each hip fractured at separate times  . INSERT / REPLACE / REMOVE PACEMAKER    . IR EXCHANGE BILIARY DRAIN  10/05/2019  . TOTAL HIP ARTHROPLASTY Right 04/20/2018   Procedure: TOTAL HIP CONVERSION;  Surgeon: Kennedy Bucker, MD;  Location: [facility] ORS;  Service: Orthopedics;  Laterality: Right;    There were no vitals filed for this visit.   Subjective Assessment - 06/03/20 1431    Subjective Son reports patient is not doing well today and has been feeling weak.     Patient is accompained by: Family member    Pertinent History ECHO 09/02/19: Moderate MVR, moderate TVR, mild AVR, EF: 45-50%. Recent Adjustments to apixaban and furosemide doses based on age/weight. Most recent Hb: 8.8 on iron supplementation, per PCP note down from baseline of 11.6 (09/14/19). Pt recently had a sleep study completed showing nocturnal hypoxia now on O2 over-night.    Limitations Lifting;Standing;Walking    How long can you sit comfortably? unlimited    How long can you stand comfortably? 15-20 minutes    How long can you walk comfortably? unknown, has been mostly performing household mobility since DC    Patient Stated Goals improved functional tolerance to AMB regarding breathing limitations    Currently in Pain? No/denies    Pain Onset More than a month ago           INTERVENTION   Therapeutic Exercise -Cardiovascular conditioning for ABLA;Nustep 2x5 min minutes LEVEL 3, Seat 6, Arms 6, SPM cued >55, vitals taken q20min. (SpO2:95% HR:70 bpm), (SpO2:94% HR:70 bpm) -STS 2x10 from green chair (SpO2: 95% HR: 122bpm) (SpO2: 96% HR: 125bpm) -Standing Marching with UE Support 2x20 (SpO2: 95% HR: 71bpm) -Ambulation using FWW x462ft (SpO2:94% HR:78 bpm; after ambulation))  - 91ft w/o AD with therapist guarding  -Stairs x3    Performed Exercises to improve function capacity        PT Education -  06/03/20 1432    Education Details Form/Technique with Exercise    Person(s) Educated Patient    Methods Explanation;Demonstration    Comprehension Verbalized understanding;Returned demonstration            PT Short Term Goals - 05/19/20 1458      PT SHORT TERM GOAL #1   Title After 4 weeks pt will tolerate 4 minutes sustained AMB c gait speed > 0.70m/s to improve capacity to perform safe household distance AMB.    Baseline At evaluation 12/25/19: 3 minute tolerance at 0.108m/s c RW; 05/19/20: 679ft 4:35 min    Time 4    Period Weeks    Status Achieved    Target Date  01/22/20      PT SHORT TERM GOAL #2   Title After 4 weeks pt to demonstrate improved 5xSTS from 19" surface, hands free in <16sec.    Baseline At eval 12/25/19: 20sec from chair with hand push-off.: 05/19/20: 17.5 in 11.85 sec    Time 4    Period Weeks    Status Achieved    Target Date 01/22/20             PT Long Term Goals - 05/19/20 1432      PT LONG TERM GOAL #1   Title After 8 weeks pt to demonstrate ability to perform 5 continuous minutes AMB @ >0.56m/s c RW. (533ft in )    Baseline 369ft; 05/22/2019: 669ft; 06/19/2019: 542ft; 08/01/2019: at eval: 3 minutes only or 339ft.; 03/25/20: 416 in 5 minutes; 05/19/20: 639ft in 4:35 min    Time 8    Period Weeks    Status Achieved      PT LONG TERM GOAL #2   Title After 8 weeks pt will demonstrate improve SLS >10sec BILAT at supervision level, no UE for recovery.    Baseline At eval 12/25/19: RLE ~3sec; LLE ~1sec; 03/25/20: RLE ~5sec  LLE ~5sec; 05/19/20: RLE 8sec  LLE 9sec    Time 8    Period Weeks    Status On-going      PT LONG TERM GOAL #3   Title At 12 weeks pt will tolerate 15 minutes moderate aerobic actiivty on recumbent stepper with supplemental O2 PRN to prepar for community based exercise compliance at DC.    Baseline Not performing; 03/25/20: 10 minutes    Time 12    Period Weeks    Status Achieved      PT LONG TERM GOAL #4   Title In 12 weeks patient will improve 5 x STS to under 12sec to decrease fall risk and improve LE functional strength.    Baseline 5xSTS: 27 sec; 05/22/2019: 20 sec; 06/19/2019: 20sec; 08/01/2019: Eval 12/25/19: 20sec; 03/25/20: 13.5 sec; 05/19/20: 11.85sec    Time 12    Period Weeks    Status Achieved                 Plan - 06/03/20 1503    Clinical Impression Statement Continued with current POC with keeping exercise intensity lower during this session due to report of patient feeling weak/tired. Patient able to complete session with many rest breaks due to SOB and fatigue.  Patient  continues to have more fatigue with SL exercises demonstrating a lack of endurance in glutes.  Patient will benefit from skilled therapy to return to prior level of function.    Personal Factors and Comorbidities Comorbidity 2;Past/Current Experience;Other    Comorbidities CHF, DVT, THA, CVA, "lung disorder"  Examination-Activity Limitations Carry;Lift;Locomotion Level;Stand;Stairs;Bend;Bathing;Dressing    Examination-Participation Restrictions Cleaning;Community Activity;Meal Prep    Stability/Clinical Decision Making Evolving/Moderate complexity    Clinical Decision Making Moderate    Rehab Potential Fair    Clinical Impairments Affecting Rehab Potential (+) highly motivated, family support (-) age, CHF, lack of opportunies for physical activity    PT Frequency 2x / week    PT Duration 12 weeks    PT Treatment/Interventions Electrical Stimulation;Iontophoresis 4mg /ml Dexamethasone;Aquatic Therapy;Cryotherapy;Moist Heat;Therapeutic activities;Patient/family education;Manual techniques;Balance training;Stair training;Gait training;Neuromuscular re-education;Therapeutic exercise;Passive range of motion    PT Next Visit Plan Continue LE Strengthening/endurance    PT Home Exercise Plan not yet established    Consulted and Agree with Plan of Care Patient;Family member/caregiver    Family Member Consulted Son           Patient will benefit from skilled therapeutic intervention in order to improve the following deficits and impairments:  Abnormal gait, Pain, Decreased coordination, Decreased mobility, Increased muscle spasms, Decreased endurance, Decreased range of motion, Decreased strength, Decreased balance, Difficulty walking, Decreased activity tolerance, Cardiopulmonary status limiting activity, Dizziness  Visit Diagnosis: Muscle weakness (generalized)  Difficulty in walking, not elsewhere classified  Pain in right hip     Problem List Patient Active Problem List   Diagnosis Date  Noted  . Malnutrition of moderate degree 11/08/2019  . COPD with acute exacerbation (HCC) 11/06/2019  . S/P laparoscopic cholecystectomy 10/30/19 11/06/2019  . CHF (congestive heart failure) (HCC) 11/06/2019  . CKD (chronic kidney disease) stage 3, GFR 30-59 ml/min (HCC) 11/06/2019  . Acute on chronic systolic CHF (congestive heart failure) (HCC) 11/06/2019  . Diastolic CHF (HCC) 11/06/2019  . Acute respiratory failure with hypoxia (HCC)   . Acute kidney injury superimposed on CKD (HCC)   . Acute cholecystitis 08/31/2019  . RUQ pain   . Chronic obstructive pulmonary disease (HCC)   . Acute DVT (deep venous thrombosis) (HCC) 04/29/2018  . 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 AF (paroxysmal atrial fibrillation) (HCC)   . Chronic systolic CHF (congestive heart failure) (HCC)   . Osteoporosis, post-menopausal   . Osteoarthritis, multiple sites    3:12 PM, 06/03/20 06/05/20, [facility] Student Physical Therapist [facility]  [phone]  [phone] 06/03/2020, 3:11 PM  [facility] PHYSICAL AND SPORTS MEDICINE [address], [address], [place] Phone: [phone]   Fax:  [phone]  Name: Diana Powell MRN: Genia Hotter Date of Birth: [birth year]

## 2020-06-11 ENCOUNTER — Other Ambulatory Visit: Payer: Self-pay

## 2020-06-11 ENCOUNTER — Ambulatory Visit: Payer: Medicare Other

## 2020-06-11 DIAGNOSIS — M6281 Muscle weakness (generalized): Secondary | ICD-10-CM

## 2020-06-11 DIAGNOSIS — R262 Difficulty in walking, not elsewhere classified: Secondary | ICD-10-CM

## 2020-06-11 NOTE — Therapy (Signed)
[place] [facility] [address], [place], [address] Phone: [phone]   Fax:  [phone]  Physical Therapy Treatment  Patient Details  Name: Diana Powell MRN: [id number] Date of Birth: 1926/09/01 Referring Provider (PT): Clarisa Kindred, FNP   Encounter Date: 06/11/2020   PT End of Session - 06/11/20 1303    Visit Number 25    Number of Visits 32    Date for PT Re-Evaluation 07/14/20    Authorization Type 3/10    PT Start Time 1300    PT Stop Time 1345    PT Time Calculation (min) 45 min    Equipment Utilized During Treatment Gait belt    Activity Tolerance Patient limited by fatigue;Patient tolerated treatment well;Treatment limited secondary to medical complications (Comment)    Behavior During Therapy [facility] for tasks assessed/performed           Past Medical History:  Diagnosis Date  . Atrial fibrillation (HCC)   . CHF (congestive heart failure) (HCC)   . Chronic systolic heart failure (HCC)   . Complication of anesthesia    CONFUSION   . COPD (chronic obstructive pulmonary disease) (HCC)   . Osteoarthritis, multiple sites   . Osteoporosis, post-menopausal   . Presence of permanent cardiac pacemaker     Past Surgical History:  Procedure Laterality Date  . CARDIOVERSION  ~2009  . HIP FRACTURE SURGERY Bilateral [phone]   each hip fractured at separate times  . INSERT / REPLACE / REMOVE PACEMAKER    . IR EXCHANGE BILIARY DRAIN  10/05/2019  . TOTAL HIP ARTHROPLASTY Right 04/20/2018   Procedure: TOTAL HIP CONVERSION;  Surgeon: Kennedy Bucker, MD;  Location: [facility] ORS;  Service: Orthopedics;  Laterality: Right;    There were no vitals filed for this visit.   Subjective Assessment - 06/11/20 1301    Subjective Patient reports that she is feeling good today. No major changes otherwise.    Patient is accompained by: Family member    Pertinent History ECHO 09/02/19: Moderate MVR, moderate TVR, mild AVR,  EF: 45-50%. Recent Adjustments to apixaban and furosemide doses based on age/weight. Most recent Hb: 8.8 on iron supplementation, per PCP note down from baseline of 11.6 (09/14/19). Pt recently had a sleep study completed showing nocturnal hypoxia now on O2 over-night.    Limitations Lifting;Standing;Walking    How long can you sit comfortably? unlimited    How long can you stand comfortably? 15-20 minutes    How long can you walk comfortably? unknown, has been mostly performing household mobility since DC    Patient Stated Goals improved functional tolerance to AMB regarding breathing limitations    Currently in Pain? No/denies    Pain Onset More than a month ago              INTERVENTION   Therapeutic Exercise -Cardiovascular conditioning for ABLA;Nustep 2x5 min minutes LEVEL 3, Seat 6, Arms 6, SPM cued >55, vitals taken q38min. (SpO2:95% HR:70 bpm), (SpO2:95% HR:90 bpm) -Ambulation using FWW x544ft (SpO2:94% HR:80 bpm; after ambulation)) -Side stepping with UE support in standing - 1 min (SpO2:96% HR:91 bpm) with changing directions Forward/backward stepping with guessing directions - 1 min (SpO2:96% HR:90 bpm) -STS 2x10 from green chair (SpO2: 95% HR: 126bpm) (SpO2: 96% HR: 131bpm)    Performed Exercises to improve function capacity      PT Education - 06/11/20 1302    Education Details form/technique with exercise    Person(s) Educated Patient  Methods Explanation;Demonstration    Comprehension Verbalized understanding;Returned demonstration            PT Short Term Goals - 05/19/20 1458      PT SHORT TERM GOAL #1   Title After 4 weeks pt will tolerate 4 minutes sustained AMB c gait speed > 0.74m/s to improve capacity to perform safe household distance AMB.    Baseline At evaluation 12/25/19: 3 minute tolerance at 0.67m/s c RW; 05/19/20: 626ft 4:35 min    Time 4    Period Weeks    Status Achieved    Target Date 01/22/20      PT SHORT TERM GOAL #2   Title After 4  weeks pt to demonstrate improved 5xSTS from 19" surface, hands free in <16sec.    Baseline At eval 12/25/19: 20sec from chair with hand push-off.: 05/19/20: 17.5 in 11.85 sec    Time 4    Period Weeks    Status Achieved    Target Date 01/22/20             PT Long Term Goals - 05/19/20 1432      PT LONG TERM GOAL #1   Title After 8 weeks pt to demonstrate ability to perform 5 continuous minutes AMB @ >0.66m/s c RW. (567ft in )    Baseline 341ft; 05/22/2019: 647ft; 06/19/2019: 563ft; 08/01/2019: at eval: 3 minutes only or 360ft.; 03/25/20: 416 in 5 minutes; 05/19/20: 630ft in 4:35 min    Time 8    Period Weeks    Status Achieved      PT LONG TERM GOAL #2   Title After 8 weeks pt will demonstrate improve SLS >10sec BILAT at supervision level, no UE for recovery.    Baseline At eval 12/25/19: RLE ~3sec; LLE ~1sec; 03/25/20: RLE ~5sec  LLE ~5sec; 05/19/20: RLE 8sec  LLE 9sec    Time 8    Period Weeks    Status On-going      PT LONG TERM GOAL #3   Title At 12 weeks pt will tolerate 15 minutes moderate aerobic actiivty on recumbent stepper with supplemental O2 PRN to prepar for community based exercise compliance at DC.    Baseline Not performing; 03/25/20: 10 minutes    Time 12    Period Weeks    Status Achieved      PT LONG TERM GOAL #4   Title In 12 weeks patient will improve 5 x STS to under 12sec to decrease fall risk and improve LE functional strength.    Baseline 5xSTS: 27 sec; 05/22/2019: 20 sec; 06/19/2019: 20sec; 08/01/2019: Eval 12/25/19: 20sec; 03/25/20: 13.5 sec; 05/19/20: 11.85sec    Time 12    Period Weeks    Status Achieved                 Plan - 06/11/20 1305    Clinical Impression Statement Performed exercises to address functional LE strength during today's session. Continued to focus on improving prolonged walking and ambulation quality to maximize distance walked. Patient requires frequent sitting rest breaks throuhgout the session. Patient will benefit  from further skilled therapy to return to prior level of function.    Personal Factors and Comorbidities Comorbidity 2;Past/Current Experience;Other    Comorbidities CHF, DVT, THA, CVA, "lung disorder"    Examination-Activity Limitations Carry;Lift;Locomotion Level;Stand;Stairs;Bend;Bathing;Dressing    Examination-Participation Restrictions Cleaning;Community Activity;Meal Prep    Stability/Clinical Decision Making Evolving/Moderate complexity    Rehab Potential Fair    Clinical Impairments Affecting Rehab Potential (+) highly  motivated, family support (-) age, CHF, lack of opportunies for physical activity    PT Frequency 2x / week    PT Duration 12 weeks    PT Treatment/Interventions Electrical Stimulation;Iontophoresis 4mg /ml Dexamethasone;Aquatic Therapy;Cryotherapy;Moist Heat;Therapeutic activities;Patient/family education;Manual techniques;Balance training;Stair training;Gait training;Neuromuscular re-education;Therapeutic exercise;Passive range of motion    PT Next Visit Plan Continue LE Strengthening/endurance    PT Home Exercise Plan not yet established    Consulted and Agree with Plan of Care Patient;Family member/caregiver    Family Member Consulted Son           Patient will benefit from skilled therapeutic intervention in order to improve the following deficits and impairments:  Abnormal gait, Pain, Decreased coordination, Decreased mobility, Increased muscle spasms, Decreased endurance, Decreased range of motion, Decreased strength, Decreased balance, Difficulty walking, Decreased activity tolerance, Cardiopulmonary status limiting activity, Dizziness  Visit Diagnosis: Muscle weakness (generalized)  Difficulty in walking, not elsewhere classified     Problem List Patient Active Problem List   Diagnosis Date Noted  . Malnutrition of moderate degree 11/08/2019  . COPD with acute exacerbation (HCC) 11/06/2019  . S/P laparoscopic cholecystectomy 10/30/19 11/06/2019  . CHF  (congestive heart failure) (HCC) 11/06/2019  . CKD (chronic kidney disease) stage 3, GFR 30-59 ml/min (HCC) 11/06/2019  . Acute on chronic systolic CHF (congestive heart failure) (HCC) 11/06/2019  . Diastolic CHF (HCC) 11/06/2019  . Acute respiratory failure with hypoxia (HCC)   . Acute kidney injury superimposed on CKD (HCC)   . Acute cholecystitis 08/31/2019  . RUQ pain   . Chronic obstructive pulmonary disease (HCC)   . Acute DVT (deep venous thrombosis) (HCC) 04/29/2018  . 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 AF (paroxysmal atrial fibrillation) (HCC)   . Chronic systolic CHF (congestive heart failure) (HCC)   . Osteoporosis, post-menopausal   . Osteoarthritis, multiple sites     05/06/2013, PT DPT 06/11/2020, 1:30 PM  [place] [facility] SPORTS MEDICINE [address], [address], [place] Phone: [phone]   Fax:  [phone]  Name: Diana Powell MRN: Genia Hotter Date of Birth: 1927/07/19

## 2020-06-17 ENCOUNTER — Ambulatory Visit: Payer: Medicare Other

## 2020-06-17 ENCOUNTER — Other Ambulatory Visit: Payer: Self-pay

## 2020-06-17 DIAGNOSIS — M6281 Muscle weakness (generalized): Secondary | ICD-10-CM

## 2020-06-17 DIAGNOSIS — R262 Difficulty in walking, not elsewhere classified: Secondary | ICD-10-CM

## 2020-06-18 NOTE — Therapy (Signed)
[place] [facility] [address] [place], [place], [address] Phone: [phone]   Fax:  [phone]  Physical Therapy Treatment  Patient Details  Name: Diana Powell MRN: [id number] Date of Birth: May 04, 1927 Referring Provider (PT): Clarisa Kindred, FNP   Encounter Date: 06/17/2020   PT End of Session - 06/17/20 1544    Visit Number 26    Number of Visits 32    Date for PT Re-Evaluation 07/14/20    Authorization Type 4/10    PT Start Time 1515    PT Stop Time 1600    PT Time Calculation (min) 45 min    Equipment Utilized During Treatment Gait belt    Activity Tolerance Patient limited by fatigue;Patient tolerated treatment well;Treatment limited secondary to medical complications (Comment)    Behavior During Therapy [facility] for tasks assessed/performed           Past Medical History:  Diagnosis Date   Atrial fibrillation (HCC)    CHF (congestive heart failure) (HCC)    Chronic systolic heart failure (HCC)    Complication of anesthesia    CONFUSION    COPD (chronic obstructive pulmonary disease) (HCC)    Osteoarthritis, multiple sites    Osteoporosis, post-menopausal    Presence of permanent cardiac pacemaker     Past Surgical History:  Procedure Laterality Date   CARDIOVERSION  ~2009   HIP FRACTURE SURGERY Bilateral [phone]   each hip fractured at separate times   INSERT / REPLACE / REMOVE PACEMAKER     IR EXCHANGE BILIARY DRAIN  10/05/2019   TOTAL HIP ARTHROPLASTY Right 04/20/2018   Procedure: TOTAL HIP CONVERSION;  Surgeon: Kennedy Bucker, MD;  Location: [facility] ORS;  Service: Orthopedics;  Laterality: Right;    There were no vitals filed for this visit.   Subjective Assessment - 06/17/20 1526    Subjective Patient's son states she continues to have good days and bad days. Patient reports she has difficult rising in the morning.    Patient is accompained by: Family member    Pertinent History ECHO  09/02/19: Moderate MVR, moderate TVR, mild AVR, EF: 45-50%. Recent Adjustments to apixaban and furosemide doses based on age/weight. Most recent Hb: 8.8 on iron supplementation, per PCP note down from baseline of 11.6 (09/14/19). Pt recently had a sleep study completed showing nocturnal hypoxia now on O2 over-night.    Limitations Lifting;Standing;Walking    How long can you sit comfortably? unlimited    How long can you stand comfortably? 15-20 minutes    How long can you walk comfortably? unknown, has been mostly performing household mobility since DC    Patient Stated Goals improved functional tolerance to AMB regarding breathing limitations    Currently in Pain? No/denies    Pain Onset More than a month ago              INTERVENTION   Therapeutic Exercise -Cardiovascular conditioning for ABLA;Nustep 2x5 min minutes LEVEL 3, Seat 6, Arms 6, SPM cued >55, vitals taken q69min. (SpO2:97% HR:71 bpm), (SpO2:95% HR:90 bpm) -Ambulation using FWW x632ft (SpO2:94% HR:82 bpm; after ambulation)); FWW  -Side stepping with UE support in standing - 1.5 min (SpO2:96% HR:88 bpm) with changing directions Forward/backward stepping with guessing directions - 1 min (SpO2:96% HR:90 bpm) Marches in standing with hip abduction with UE support - x 15 B (SpO2: 98% HR: 90bpm) -STS 2x10 from green chair (SpO2: 95% HR: 129bpm) (SpO2: 96% HR: 131bpm)     Performed Exercises  to improve function capacity       PT Education - 06/17/20 1544    Education Details form/technique with exercise    Person(s) Educated Patient    Methods Explanation;Demonstration    Comprehension Verbalized understanding;Returned demonstration            PT Short Term Goals - 05/19/20 1458      PT SHORT TERM GOAL #1   Title After 4 weeks pt will tolerate 4 minutes sustained AMB c gait speed > 0.9m/s to improve capacity to perform safe household distance AMB.    Baseline At evaluation 12/25/19: 3 minute tolerance at 0.28m/s c RW;  05/19/20: 652ft 4:35 min    Time 4    Period Weeks    Status Achieved    Target Date 01/22/20      PT SHORT TERM GOAL #2   Title After 4 weeks pt to demonstrate improved 5xSTS from 19" surface, hands free in <16sec.    Baseline At eval 12/25/19: 20sec from chair with hand push-off.: 05/19/20: 17.5 in 11.85 sec    Time 4    Period Weeks    Status Achieved    Target Date 01/22/20             PT Long Term Goals - 05/19/20 1432      PT LONG TERM GOAL #1   Title After 8 weeks pt to demonstrate ability to perform 5 continuous minutes AMB @ >0.24m/s c RW. (534ft in )    Baseline 371ft; 05/22/2019: 620ft; 06/19/2019: 568ft; 08/01/2019: at eval: 3 minutes only or 325ft.; 03/25/20: 416 in 5 minutes; 05/19/20: 697ft in 4:35 min    Time 8    Period Weeks    Status Achieved      PT LONG TERM GOAL #2   Title After 8 weeks pt will demonstrate improve SLS >10sec BILAT at supervision level, no UE for recovery.    Baseline At eval 12/25/19: RLE ~3sec; LLE ~1sec; 03/25/20: RLE ~5sec  LLE ~5sec; 05/19/20: RLE 8sec  LLE 9sec    Time 8    Period Weeks    Status On-going      PT LONG TERM GOAL #3   Title At 12 weeks pt will tolerate 15 minutes moderate aerobic actiivty on recumbent stepper with supplemental O2 PRN to prepar for community based exercise compliance at DC.    Baseline Not performing; 03/25/20: 10 minutes    Time 12    Period Weeks    Status Achieved      PT LONG TERM GOAL #4   Title In 12 weeks patient will improve 5 x STS to under 12sec to decrease fall risk and improve LE functional strength.    Baseline 5xSTS: 27 sec; 05/22/2019: 20 sec; 06/19/2019: 20sec; 08/01/2019: Eval 12/25/19: 20sec; 03/25/20: 13.5 sec; 05/19/20: 11.85sec    Time 12    Period Weeks    Status Achieved                 Plan - 06/17/20 1550    Clinical Impression Statement Continued to focus on improving cardiac load with exercises focused on greater performance of activities in standing. Improvement  in walking exercises as she was able to amb 647ft x 2 ; 363ft greater than the previous session. Patient still demonstrates poor LE strength. Patient will benefit from further skilled therapy to return to prior level of function.    Personal Factors and Comorbidities Comorbidity 2;Past/Current Experience;Other    Comorbidities CHF, DVT, THA,  CVA, "lung disorder"    Examination-Activity Limitations Carry;Lift;Locomotion Level;Stand;Stairs;Bend;Bathing;Dressing    Examination-Participation Restrictions Cleaning;Community Activity;Meal Prep    Stability/Clinical Decision Making Evolving/Moderate complexity    Rehab Potential Fair    Clinical Impairments Affecting Rehab Potential (+) highly motivated, family support (-) age, CHF, lack of opportunies for physical activity    PT Frequency 2x / week    PT Duration 12 weeks    PT Treatment/Interventions Electrical Stimulation;Iontophoresis 4mg /ml Dexamethasone;Aquatic Therapy;Cryotherapy;Moist Heat;Therapeutic activities;Patient/family education;Manual techniques;Balance training;Stair training;Gait training;Neuromuscular re-education;Therapeutic exercise;Passive range of motion    PT Next Visit Plan Continue LE Strengthening/endurance    PT Home Exercise Plan not yet established    Consulted and Agree with Plan of Care Patient;Family member/caregiver    Family Member Consulted Son           Patient will benefit from skilled therapeutic intervention in order to improve the following deficits and impairments:  Abnormal gait, Pain, Decreased coordination, Decreased mobility, Increased muscle spasms, Decreased endurance, Decreased range of motion, Decreased strength, Decreased balance, Difficulty walking, Decreased activity tolerance, Cardiopulmonary status limiting activity, Dizziness  Visit Diagnosis: Muscle weakness (generalized)  Difficulty in walking, not elsewhere classified     Problem List Patient Active Problem List   Diagnosis Date  Noted   Malnutrition of moderate degree 11/08/2019   COPD with acute exacerbation (HCC) 11/06/2019   S/P laparoscopic cholecystectomy 10/30/19 11/06/2019   CHF (congestive heart failure) (HCC) 11/06/2019   CKD (chronic kidney disease) stage 3, GFR 30-59 ml/min (HCC) 11/06/2019   Acute on chronic systolic CHF (congestive heart failure) (HCC) 11/06/2019   Diastolic CHF (HCC) 11/06/2019   Acute respiratory failure with hypoxia (HCC)    Acute kidney injury superimposed on CKD (HCC)    Acute cholecystitis 08/31/2019   RUQ pain    Chronic obstructive pulmonary disease (HCC)    Acute DVT (deep venous thrombosis) (HCC) 04/29/2018   Status post total hip replacement, right 04/20/2018   Dementia due to medical condition without behavioral disturbance (HCC) 05/04/2013   AF (paroxysmal atrial fibrillation) (HCC)    Chronic systolic CHF (congestive heart failure) (HCC)    Osteoporosis, post-menopausal    Osteoarthritis, multiple sites     05/06/2013, PT DPT 06/18/2020, 10:11 AM  [place] [facility] [address], [address], [place] Phone: [phone]   Fax:  [phone]  Name: Diana Powell MRN: Genia Hotter Date of Birth: [birth year]

## 2020-06-24 ENCOUNTER — Ambulatory Visit: Payer: Medicare Other | Attending: Geriatric Medicine

## 2020-06-24 ENCOUNTER — Other Ambulatory Visit: Payer: Self-pay

## 2020-06-24 DIAGNOSIS — R262 Difficulty in walking, not elsewhere classified: Secondary | ICD-10-CM | POA: Insufficient documentation

## 2020-06-24 DIAGNOSIS — M25532 Pain in left wrist: Secondary | ICD-10-CM | POA: Insufficient documentation

## 2020-06-24 DIAGNOSIS — M6281 Muscle weakness (generalized): Secondary | ICD-10-CM | POA: Diagnosis present

## 2020-06-24 DIAGNOSIS — M25551 Pain in right hip: Secondary | ICD-10-CM | POA: Insufficient documentation

## 2020-06-24 DIAGNOSIS — M25632 Stiffness of left wrist, not elsewhere classified: Secondary | ICD-10-CM | POA: Insufficient documentation

## 2020-06-24 NOTE — Therapy (Signed)
[place] [facility] PH[facility] SPORTS MEDICINE [address] [place], [address] Phone: [phone]   Fax:  [phone]  Physical Therapy Treatment  Patient Details  Name: Diana Powell MRN: [id number] Date of Birth: [birth year] Referring Provider (PT): Clarisa Kindred, FNP   Encounter Date: 06/24/2020   PT End of Session - 06/24/20 1521    Visit Number 27    Number of Visits 32    Date for PT Re-Evaluation 07/14/20    PT Start Time 1517    PT Stop Time 1557    PT Time Calculation (min) 40 min    Equipment Utilized During Treatment Gait belt    Activity Tolerance Patient limited by fatigue;Patient tolerated treatment well;Treatment limited secondary to medical complications (Comment)    Behavior During Therapy [facility] for tasks assessed/performed           Past Medical History:  Diagnosis Date  . Atrial fibrillation (HCC)   . CHF (congestive heart failure) (HCC)   . Chronic systolic heart failure (HCC)   . Complication of anesthesia    CONFUSION   . COPD (chronic obstructive pulmonary disease) (HCC)   . Osteoarthritis, multiple sites   . Osteoporosis, post-menopausal   . Presence of permanent cardiac pacemaker     Past Surgical History:  Procedure Laterality Date  . CARDIOVERSION  ~2009  . HIP FRACTURE SURGERY Bilateral [phone]   each hip fractured at separate times  . INSERT / REPLACE / REMOVE PACEMAKER    . IR EXCHANGE BILIARY DRAIN  10/05/2019  . TOTAL HIP ARTHROPLASTY Right 04/20/2018   Procedure: TOTAL HIP CONVERSION;  Surgeon: Kennedy Bucker, MD;  Location: [facility] ORS;  Service: Orthopedics;  Laterality: Right;    There were no vitals filed for this visit.   Subjective Assessment - 06/24/20 1521    Subjective Pt doing well today, no pain. Pt supposed to see her ortho on Thursday for knee injection.    Pertinent History ECHO 09/02/19: Moderate MVR, moderate TVR, mild AVR, EF: 45-50%. Recent Adjustments to apixaban and furosemide  doses based on age/weight. Most recent Hb: 8.8 on iron supplementation, per PCP note down from baseline of 11.6 (09/14/19). Pt recently had a sleep study completed showing nocturnal hypoxia now on O2 over-night.          INTERVENTION THIS DATE:  -Cardiovascular conditioning for ABLA;Nustep 2x5 min minutes LEVEL 3, Seat 6, Arms 6, SPM cued >55, vitals taken q52min. (SpO2:94% HR:71 bpm), (SpO2:96% HR: 70bpm) Dyspnea: 2/4 -Ambulation using FWW 1x571ft (SpO2:96% HR:96 bpm; after ambulation)); FWW 3 minutes seated break Post AMB Dyspnea 3/4, pain in bilat.   -STS 2x10 from green chair (SpO2: 95% HR: 128bpm) (SpO2: 97% HR: 127bpm) -stair climbing 1x12 reciprocal step-over step, 2 rails, supervision level;  -Ambulation without device 1x189ft, supervision level assistance     PT Short Term Goals - 05/19/20 1458      PT SHORT TERM GOAL #1   Title After 4 weeks pt will tolerate 4 minutes sustained AMB c gait speed > 0.12m/s to improve capacity to perform safe household distance AMB.    Baseline At evaluation 12/25/19: 3 minute tolerance at 0.14m/s c RW; 05/19/20: 646ft 4:35 min    Time 4    Period Weeks    Status Achieved    Target Date 01/22/20      PT SHORT TERM GOAL #2   Title After 4 weeks pt to demonstrate improved 5xSTS from 19" surface, hands free in <16sec.  Baseline At eval 12/25/19: 20sec from chair with hand push-off.: 05/19/20: 17.5 in 11.85 sec    Time 4    Period Weeks    Status Achieved    Target Date 01/22/20             PT Long Term Goals - 05/19/20 1432      PT LONG TERM GOAL #1   Title After 8 weeks pt to demonstrate ability to perform 5 continuous minutes AMB @ >0.49m/s c RW. (560ft in )    Baseline 39ft; 05/22/2019: 643ft; 06/19/2019: 525ft; 08/01/2019: at eval: 3 minutes only or 349ft.; 03/25/20: 416 in 5 minutes; 05/19/20: 610ft in 4:35 min    Time 8    Period Weeks    Status Achieved      PT LONG TERM GOAL #2   Title After 8 weeks pt will demonstrate  improve SLS >10sec BILAT at supervision level, no UE for recovery.    Baseline At eval 12/25/19: RLE ~3sec; LLE ~1sec; 03/25/20: RLE ~5sec  LLE ~5sec; 05/19/20: RLE 8sec  LLE 9sec    Time 8    Period Weeks    Status On-going      PT LONG TERM GOAL #3   Title At 12 weeks pt will tolerate 15 minutes moderate aerobic actiivty on recumbent stepper with supplemental O2 PRN to prepar for community based exercise compliance at DC.    Baseline Not performing; 03/25/20: 10 minutes    Time 12    Period Weeks    Status Achieved      PT LONG TERM GOAL #4   Title In 12 weeks patient will improve 5 x STS to under 12sec to decrease fall risk and improve LE functional strength.    Baseline 5xSTS: 27 sec; 05/22/2019: 20 sec; 06/19/2019: 20sec; 08/01/2019: Eval 12/25/19: 20sec; 03/25/20: 13.5 sec; 05/19/20: 11.85sec    Time 12    Period Weeks    Status Achieved                 Plan - 06/24/20 1523    Clinical Impression Statement Continued with cardiovascular adaptive training. Pt reports feeling a bit more tired today. Appears that patient is trending lower on HR this date, low 70s c Nustep. Pt's vitals trend remain consistent with prior sessions, AMB the most intense cardiovascular activity so far. Pt continues to grow in confidence and in motivated to maximize her independence as much as possible.    Personal Factors and Comorbidities Comorbidity 2;Past/Current Experience;Other    Comorbidities CHF, DVT, THA, CVA, "lung disorder"    Examination-Activity Limitations Carry;Lift;Locomotion Level;Stand;Stairs;Bend;Bathing;Dressing    Examination-Participation Restrictions Cleaning;Community Activity;Meal Prep    Stability/Clinical Decision Making Evolving/Moderate complexity    Clinical Decision Making Moderate    Rehab Potential Fair    Clinical Impairments Affecting Rehab Potential (+) highly motivated, family support (-) age, CHF, lack of opportunies for physical activity    PT Frequency 2x / week     PT Duration 12 weeks    PT Treatment/Interventions Electrical Stimulation;Iontophoresis 4mg /ml Dexamethasone;Aquatic Therapy;Cryotherapy;Moist Heat;Therapeutic activities;Patient/family education;Manual techniques;Balance training;Stair training;Gait training;Neuromuscular re-education;Therapeutic exercise;Passive range of motion    PT Next Visit Plan Continue LE Strengthening/endurance    PT Home Exercise Plan not yet established    Consulted and Agree with Plan of Care Patient;Family member/caregiver    Family Member Consulted Son           Patient will benefit from skilled therapeutic intervention in order to improve the following deficits and impairments:  Abnormal gait, Pain, Decreased coordination, Decreased mobility, Increased muscle spasms, Decreased endurance, Decreased range of motion, Decreased strength, Decreased balance, Difficulty walking, Decreased activity tolerance, Cardiopulmonary status limiting activity, Dizziness  Visit Diagnosis: Muscle weakness (generalized)  Difficulty in walking, not elsewhere classified  Pain in right hip  Pain in left wrist  Stiffness of left wrist, not elsewhere classified     Problem List Patient Active Problem List   Diagnosis Date Noted  . Malnutrition of moderate degree 11/08/2019  . COPD with acute exacerbation (HCC) 11/06/2019  . S/P laparoscopic cholecystectomy 10/30/19 11/06/2019  . CHF (congestive heart failure) (HCC) 11/06/2019  . CKD (chronic kidney disease) stage 3, GFR 30-59 ml/min (HCC) 11/06/2019  . Acute on chronic systolic CHF (congestive heart failure) (HCC) 11/06/2019  . Diastolic CHF (HCC) 11/06/2019  . Acute respiratory failure with hypoxia (HCC)   . Acute kidney injury superimposed on CKD (HCC)   . Acute cholecystitis 08/31/2019  . RUQ pain   . Chronic obstructive pulmonary disease (HCC)   . Acute DVT (deep venous thrombosis) (HCC) 04/29/2018  . 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 Dementia  due to medical condition without behavioral disturbance (HCC) 05/04/2013  . AF (paroxysmal atrial fibrillation) (HCC)   . Chronic systolic CHF (congestive heart failure) (HCC)   . Osteoporosis, post-menopausal   . Osteoarthritis, multiple sites    4:03 PM, 06/24/20 Rosamaria Lints, PT, DPT Physical Therapist - [place] [phone] (Office)    Theadora Noyes C 06/24/2020, 3:28 PM  [place] [facility] PHYSICAL AND SPORTS MEDICINE [address], [place], [address] Phone: [phone]   Fax:  [phone]  Name: Diana Powell MRN: [id number] Date of Birth: April 23, 1927

## 2020-06-30 ENCOUNTER — Other Ambulatory Visit: Payer: Self-pay

## 2020-06-30 ENCOUNTER — Ambulatory Visit: Payer: Medicare Other

## 2020-06-30 DIAGNOSIS — R262 Difficulty in walking, not elsewhere classified: Secondary | ICD-10-CM

## 2020-06-30 DIAGNOSIS — M6281 Muscle weakness (generalized): Secondary | ICD-10-CM | POA: Diagnosis not present

## 2020-06-30 NOTE — Therapy (Signed)
[place] [facility] PHYS[facility] SPORTS MEDICINE [address], [place], [address] Phone: [phone]   Fax:  [phone]  Physical Therapy Treatment  Patient Details  Name: Diana Powell MRN: [id number] Date of Birth: 30-May-1927 Referring Provider (PT): Clarisa Kindred, FNP   Encounter Date: 06/30/2020   PT End of Session - 06/30/20 1528    Visit Number 28    Number of Visits 32    Date for PT Re-Evaluation 07/14/20    Authorization Type 6/10    PT Start Time 1515    PT Stop Time 1600    PT Time Calculation (min) 45 min    Equipment Utilized During Treatment Gait belt    Activity Tolerance Patient limited by fatigue;Patient tolerated treatment well;Treatment limited secondary to medical complications (Comment)    Behavior During Therapy [facility] for tasks assessed/performed           Past Medical History:  Diagnosis Date  . Atrial fibrillation (HCC)   . CHF (congestive heart failure) (HCC)   . Chronic systolic heart failure (HCC)   . Complication of anesthesia    CONFUSION   . COPD (chronic obstructive pulmonary disease) (HCC)   . Osteoarthritis, multiple sites   . Osteoporosis, post-menopausal   . Presence of permanent cardiac pacemaker     Past Surgical History:  Procedure Laterality Date  . CARDIOVERSION  ~2009  . HIP FRACTURE SURGERY Bilateral [phone]   each hip fractured at separate times  . INSERT / REPLACE / REMOVE PACEMAKER    . IR EXCHANGE BILIARY DRAIN  10/05/2019  . TOTAL HIP ARTHROPLASTY Right 04/20/2018   Procedure: TOTAL HIP CONVERSION;  Surgeon: Kennedy Bucker, MD;  Location: [facility] ORS;  Service: Orthopedics;  Laterality: Right;    There were no vitals filed for this visit.   Subjective Assessment - 06/30/20 1527    Subjective Patient's son report she's been tired and states she is "not good" today.    Pertinent History ECHO 09/02/19: Moderate MVR, moderate TVR, mild AVR, EF: 45-50%. Recent Adjustments to apixaban and  furosemide doses based on age/weight. Most recent Hb: 8.8 on iron supplementation, per PCP note down from baseline of 11.6 (09/14/19). Pt recently had a sleep study completed showing nocturnal hypoxia now on O2 over-night.    Patient Stated Goals improved functional tolerance to AMB regarding breathing limitations    Currently in Pain? No/denies                INTERVENTION   Therapeutic Exercise -Cardiovascular conditioning for ABLA;Nustep 2x5 min minutes LEVEL 3, Seat 6, Arms 6, SPM cued >55, vitals taken q41min. (SpO2:97% HR:71 bpm), (SpO2:95% HR:70 bpm) -Ambulation using FWW x690ft (SpO2:94% HR:86 bpm; after ambulation)); FWW x49ft -STS 2x10 from green chair (SpO2: 96% HR: 124bpm) (SpO2: 96% HR: 131bpm) -Side stepping with UE support in standing - 1 min (SpO2:94% HR:84 bpm) with changing directions -Stairs ascending/descending - x 4 (4stairs)     Performed Exercises to improve function capacity     PT Education - 06/30/20 1528    Education Details form/technique with exercise    Person(s) Educated Patient    Methods Explanation;Demonstration    Comprehension Verbalized understanding;Returned demonstration            PT Short Term Goals - 05/19/20 1458      PT SHORT TERM GOAL #1   Title After 4 weeks pt will tolerate 4 minutes sustained AMB c gait speed > 0.48m/s to improve capacity to perform safe  household distance AMB.    Baseline At evaluation 12/25/19: 3 minute tolerance at 0.2m/s c RW; 05/19/20: 628ft 4:35 min    Time 4    Period Weeks    Status Achieved    Target Date 01/22/20      PT SHORT TERM GOAL #2   Title After 4 weeks pt to demonstrate improved 5xSTS from 19" surface, hands free in <16sec.    Baseline At eval 12/25/19: 20sec from chair with hand push-off.: 05/19/20: 17.5 in 11.85 sec    Time 4    Period Weeks    Status Achieved    Target Date 01/22/20             PT Long Term Goals - 05/19/20 1432      PT LONG TERM GOAL #1   Title After 8 weeks pt  to demonstrate ability to perform 5 continuous minutes AMB @ >0.81m/s c RW. (531ft in )    Baseline 38ft; 05/22/2019: 638ft; 06/19/2019: 526ft; 08/01/2019: at eval: 3 minutes only or 349ft.; 03/25/20: 416 in 5 minutes; 05/19/20: 654ft in 4:35 min    Time 8    Period Weeks    Status Achieved      PT LONG TERM GOAL #2   Title After 8 weeks pt will demonstrate improve SLS >10sec BILAT at supervision level, no UE for recovery.    Baseline At eval 12/25/19: RLE ~3sec; LLE ~1sec; 03/25/20: RLE ~5sec  LLE ~5sec; 05/19/20: RLE 8sec  LLE 9sec    Time 8    Period Weeks    Status On-going      PT LONG TERM GOAL #3   Title At 12 weeks pt will tolerate 15 minutes moderate aerobic actiivty on recumbent stepper with supplemental O2 PRN to prepar for community based exercise compliance at DC.    Baseline Not performing; 03/25/20: 10 minutes    Time 12    Period Weeks    Status Achieved      PT LONG TERM GOAL #4   Title In 12 weeks patient will improve 5 x STS to under 12sec to decrease fall risk and improve LE functional strength.    Baseline 5xSTS: 27 sec; 05/22/2019: 20 sec; 06/19/2019: 20sec; 08/01/2019: Eval 12/25/19: 20sec; 03/25/20: 13.5 sec; 05/19/20: 11.85sec    Time 12    Period Weeks    Status Achieved                 Plan - 06/30/20 1542    Clinical Impression Statement Vitals stay within normal limits during the entirity of the session. Patient with increased lethargy today limiting exercises load. Will continue to progress functional exercises throughout future sessions to decrease risk of falls.    Personal Factors and Comorbidities Comorbidity 2;Past/Current Experience;Other    Comorbidities CHF, DVT, THA, CVA, "lung disorder"    Examination-Activity Limitations Carry;Lift;Locomotion Level;Stand;Stairs;Bend;Bathing;Dressing    Examination-Participation Restrictions Cleaning;Community Activity;Meal Prep    Stability/Clinical Decision Making Evolving/Moderate complexity     Rehab Potential Fair    Clinical Impairments Affecting Rehab Potential (+) highly motivated, family support (-) age, CHF, lack of opportunies for physical activity    PT Frequency 2x / week    PT Duration 12 weeks    PT Treatment/Interventions Electrical Stimulation;Iontophoresis 4mg /ml Dexamethasone;Aquatic Therapy;Cryotherapy;Moist Heat;Therapeutic activities;Patient/family education;Manual techniques;Balance training;Stair training;Gait training;Neuromuscular re-education;Therapeutic exercise;Passive range of motion    PT Next Visit Plan Continue LE Strengthening/endurance    PT Home Exercise Plan not yet established    Consulted and Agree  with Plan of Care Patient;Family member/caregiver    Family Member Consulted Son           Patient will benefit from skilled therapeutic intervention in order to improve the following deficits and impairments:  Abnormal gait,Pain,Decreased coordination,Decreased mobility,Increased muscle spasms,Decreased endurance,Decreased range of motion,Decreased strength,Decreased balance,Difficulty walking,Decreased activity tolerance,Cardiopulmonary status limiting activity,Dizziness  Visit Diagnosis: Muscle weakness (generalized)  Difficulty in walking, not elsewhere classified     Problem List Patient Active Problem List   Diagnosis Date Noted  . Malnutrition of moderate degree 11/08/2019  . COPD with acute exacerbation (HCC) 11/06/2019  . S/P laparoscopic cholecystectomy 10/30/19 11/06/2019  . CHF (congestive heart failure) (HCC) 11/06/2019  . CKD (chronic kidney disease) stage 3, GFR 30-59 ml/min (HCC) 11/06/2019  . Acute on chronic systolic CHF (congestive heart failure) (HCC) 11/06/2019  . Diastolic CHF (HCC) 11/06/2019  . Acute respiratory failure with hypoxia (HCC)   . Acute kidney injury superimposed on CKD (HCC)   . Acute cholecystitis 08/31/2019  . RUQ pain   . Chronic obstructive pulmonary disease (HCC)   . Acute DVT (deep venous  thrombosis) (HCC) 04/29/2018  . 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 AF (paroxysmal atrial fibrillation) (HCC)   . Chronic systolic CHF (congestive heart failure) (HCC)   . Osteoporosis, post-menopausal   . Osteoarthritis, multiple sites     [name], PT DPT 06/30/2020, 3:56 PM  [place] [facility] PHYSICAL AND SPORTS MEDICINE [address], [place], [address] Phone: [phone]   Fax:  [phone]  Name: Ricci Paff MRN: [id number] Date of Birth: 03-Jul-1927

## 2020-07-07 ENCOUNTER — Ambulatory Visit: Payer: Medicare Other

## 2020-07-07 ENCOUNTER — Other Ambulatory Visit: Payer: Self-pay

## 2020-07-07 DIAGNOSIS — M6281 Muscle weakness (generalized): Secondary | ICD-10-CM

## 2020-07-07 DIAGNOSIS — R262 Difficulty in walking, not elsewhere classified: Secondary | ICD-10-CM

## 2020-07-07 DIAGNOSIS — M25551 Pain in right hip: Secondary | ICD-10-CM

## 2020-07-07 NOTE — Therapy (Signed)
[facility] [address] [place], [address] Phone: [phone]   Fax:  [phone]  Physical Therapy Treatment  Patient Details  Name: Diana Powell MRN: [id number] Date of Birth: 01-May-1927 Referring Provider (PT): Clarisa Kindred, FNP   Encounter Date: 07/07/2020   PT End of Session - 07/07/20 1533    Visit Number 29    Number of Visits 32    Date for PT Re-Evaluation 07/14/20    Authorization Type 7/10    PT Start Time 1515    PT Stop Time 1600    PT Time Calculation (min) 45 min    Equipment Utilized During Treatment Gait belt    Activity Tolerance Patient limited by fatigue;Patient tolerated treatment well;Treatment limited secondary to medical complications (Comment)    Behavior During Therapy [facility] for tasks assessed/performed           Past Medical History:  Diagnosis Date  . Atrial fibrillation (HCC)   . CHF (congestive heart failure) (HCC)   . Chronic systolic heart failure (HCC)   . Complication of anesthesia    CONFUSION   . COPD (chronic obstructive pulmonary disease) (HCC)   . Osteoarthritis, multiple sites   . Osteoporosis, post-menopausal   . Presence of permanent cardiac pacemaker     Past Surgical History:  Procedure Laterality Date  . CARDIOVERSION  ~2009  . HIP FRACTURE SURGERY Bilateral [phone]   each hip fractured at separate times  . INSERT / REPLACE / REMOVE PACEMAKER    . IR EXCHANGE BILIARY DRAIN  10/05/2019  . TOTAL HIP ARTHROPLASTY Right 04/20/2018   Procedure: TOTAL HIP CONVERSION;  Surgeon: Kennedy Bucker, MD;  Location: [facility] ORS;  Service: Orthopedics;  Laterality: Right;    There were no vitals filed for this visit.   Subjective Assessment - 07/07/20 1527    Subjective Patient's reports the patient has been tired today secondary to not sleeping last night.    Pertinent History ECHO 09/02/19: Moderate MVR, moderate TVR, mild AVR, EF: 45-50%. Recent Adjustments to  apixaban and furosemide doses based on age/weight. Most recent Hb: 8.8 on iron supplementation, per PCP note down from baseline of 11.6 (09/14/19). Pt recently had a sleep study completed showing nocturnal hypoxia now on O2 over-night.    Patient Stated Goals improved functional tolerance to AMB regarding breathing limitations    Currently in Pain? No/denies                INTERVENTION   Therapeutic Exercise -Cardiovascular conditioning for ABLA;Nustep 2x5 min minutes LEVEL 3, Seat 6, Arms 6, SPM cued >55, vitals taken q31min. (SpO2:97% HR:71 bpm), (SpO2:95% HR:70 bpm) -Ambulation using FWW x540ft (SpO2:95% HR:84 bpm; after ambulation)); No AD x220ft -STS 2x10 from green chair (SpO2: 94% HR: 124bpm) (SpO2: 94% HR: 126bpm) Hip abduction with UE support - x 10 B -Stairs ascending/descending - x 5 (4stairs)     Performed Exercises to improve function capacity     PT Education - 07/07/20 1531    Education Details form/technique with exercise    Person(s) Educated Patient    Methods Explanation;Demonstration    Comprehension Verbalized understanding;Returned demonstration            PT Short Term Goals - 05/19/20 1458      PT SHORT TERM GOAL #1   Title After 4 weeks pt will tolerate 4 minutes sustained AMB c gait speed > 0.25m/s to improve capacity to perform safe household distance AMB.  Baseline At evaluation 12/25/19: 3 minute tolerance at 0.37m/s c RW; 05/19/20: 687ft 4:35 min    Time 4    Period Weeks    Status Achieved    Target Date 01/22/20      PT SHORT TERM GOAL #2   Title After 4 weeks pt to demonstrate improved 5xSTS from 19" surface, hands free in <16sec.    Baseline At eval 12/25/19: 20sec from chair with hand push-off.: 05/19/20: 17.5 in 11.85 sec    Time 4    Period Weeks    Status Achieved    Target Date 01/22/20             PT Long Term Goals - 05/19/20 1432      PT LONG TERM GOAL #1   Title After 8 weeks pt to demonstrate ability to perform 5  continuous minutes AMB @ >0.41m/s c RW. (521ft in )    Baseline 369ft; 05/22/2019: 623ft; 06/19/2019: 570ft; 08/01/2019: at eval: 3 minutes only or 372ft.; 03/25/20: 416 in 5 minutes; 05/19/20: 69ft in 4:35 min    Time 8    Period Weeks    Status Achieved      PT LONG TERM GOAL #2   Title After 8 weeks pt will demonstrate improve SLS >10sec BILAT at supervision level, no UE for recovery.    Baseline At eval 12/25/19: RLE ~3sec; LLE ~1sec; 03/25/20: RLE ~5sec  LLE ~5sec; 05/19/20: RLE 8sec  LLE 9sec    Time 8    Period Weeks    Status On-going      PT LONG TERM GOAL #3   Title At 12 weeks pt will tolerate 15 minutes moderate aerobic actiivty on recumbent stepper with supplemental O2 PRN to prepar for community based exercise compliance at DC.    Baseline Not performing; 03/25/20: 10 minutes    Time 12    Period Weeks    Status Achieved      PT LONG TERM GOAL #4   Title In 12 weeks patient will improve 5 x STS to under 12sec to decrease fall risk and improve LE functional Powell.    Baseline 5xSTS: 27 sec; 05/22/2019: 20 sec; 06/19/2019: 20sec; 08/01/2019: Eval 12/25/19: 20sec; 03/25/20: 13.5 sec; 05/19/20: 11.85sec    Time 12    Period Weeks    Status Achieved                 Plan - 07/07/20 1540    Clinical Impression Statement Performed 200 ft of ambulation without use of AD today, patient performs this well, however has increased difficulties with performing ambulation past 124ft with increased postural sway and shortened steps taken. Patient only slept for 2 hours the previous night, required increased sitting rest break secondary to this. Patient will benefit from further skilled therapy to return to prior level of function.    Personal Factors and Comorbidities Comorbidity 2;Past/Current Experience;Other    Comorbidities CHF, DVT, THA, CVA, "lung disorder"    Examination-Activity Limitations Carry;Lift;Locomotion Level;Stand;Stairs;Bend;Bathing;Dressing     Examination-Participation Restrictions Cleaning;Community Activity;Meal Prep    Stability/Clinical Decision Making Evolving/Moderate complexity    Rehab Potential Fair    Clinical Impairments Affecting Rehab Potential (+) highly motivated, family support (-) age, CHF, lack of opportunies for physical activity    PT Frequency 2x / week    PT Duration 12 weeks    PT Treatment/Interventions Electrical Stimulation;Iontophoresis 4mg /ml Dexamethasone;Aquatic Therapy;Cryotherapy;Moist Heat;Therapeutic activities;Patient/family education;Manual techniques;Balance training;Stair training;Gait training;Neuromuscular re-education;Therapeutic exercise;Passive range of motion  PT Next Visit Plan Continue LE Strengthening/endurance    PT Home Exercise Plan not yet established    Consulted and Agree with Plan of Care Patient;Family member/caregiver    Family Member Consulted Son           Patient will benefit from skilled therapeutic intervention in order to improve the following deficits and impairments:  Abnormal gait,Pain,Decreased coordination,Decreased mobility,Increased muscle spasms,Decreased endurance,Decreased range of motion,Decreased Powell,Decreased balance,Difficulty walking,Decreased activity tolerance,Cardiopulmonary status limiting activity,Dizziness  Visit Diagnosis: Muscle weakness (generalized)  Difficulty in walking, not elsewhere classified  Pain in right hip     Problem List Patient Active Problem List   Diagnosis Date Noted  . Malnutrition of moderate degree 11/08/2019  . COPD with acute exacerbation (HCC) 11/06/2019  . S/P laparoscopic cholecystectomy 10/30/19 11/06/2019  . CHF (congestive heart failure) (HCC) 11/06/2019  . CKD (chronic kidney disease) stage 3, GFR 30-59 ml/min (HCC) 11/06/2019  . Acute on chronic systolic CHF (congestive heart failure) (HCC) 11/06/2019  . Diastolic CHF (HCC) 11/06/2019  . Acute respiratory failure with hypoxia (HCC)   . Acute  kidney injury superimposed on CKD (HCC)   . Acute cholecystitis 08/31/2019  . RUQ pain   . Chronic obstructive pulmonary disease (HCC)   . Acute DVT (deep venous thrombosis) (HCC) 04/29/2018  . 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 AF (paroxysmal atrial fibrillation) (HCC)   . Chronic systolic CHF (congestive heart failure) (HCC)   . Osteoporosis, post-menopausal   . Osteoarthritis, multiple sites     [name], PT DPT 07/07/2020, 4:15 PM  [facility] [address] [place], [address] Phone: [phone]   Fax:  [phone]  Name: Diana Powell MRN: [id number] Date of Birth: 1926-08-09

## 2020-07-23 ENCOUNTER — Other Ambulatory Visit: Payer: Self-pay

## 2020-07-23 ENCOUNTER — Ambulatory Visit: Payer: Medicare Other | Attending: Geriatric Medicine

## 2020-07-23 DIAGNOSIS — R262 Difficulty in walking, not elsewhere classified: Secondary | ICD-10-CM | POA: Insufficient documentation

## 2020-07-23 DIAGNOSIS — M25551 Pain in right hip: Secondary | ICD-10-CM | POA: Diagnosis present

## 2020-07-23 DIAGNOSIS — M6281 Muscle weakness (generalized): Secondary | ICD-10-CM | POA: Diagnosis present

## 2020-07-23 NOTE — Therapy (Signed)
[facility] [address] [place], [address] Phone: [phone]   Fax:  [phone]  Physical Therapy Treatment  Patient Details  Name: Diana Powell MRN: [id number] Date of Birth: 30-Nov-1926 Referring Provider (PT): Clarisa Kindred, FNP   Encounter Date: 07/23/2020   PT End of Session - 07/23/20 1658    Visit Number 30    Number of Visits 32    Date for PT Re-Evaluation 07/14/20    Authorization Type 7/10    PT Start Time 1630    PT Stop Time 1715    PT Time Calculation (min) 45 min    Equipment Utilized During Treatment Gait belt    Activity Tolerance Patient limited by fatigue;Patient tolerated treatment well;Treatment limited secondary to medical complications (Comment)    Behavior During Therapy [facility] for tasks assessed/performed           Past Medical History:  Diagnosis Date  . Atrial fibrillation (HCC)   . CHF (congestive heart failure) (HCC)   . Chronic systolic heart failure (HCC)   . Complication of anesthesia    CONFUSION   . COPD (chronic obstructive pulmonary disease) (HCC)   . Osteoarthritis, multiple sites   . Osteoporosis, post-menopausal   . Presence of permanent cardiac pacemaker     Past Surgical History:  Procedure Laterality Date  . CARDIOVERSION  ~2009  . HIP FRACTURE SURGERY Bilateral [phone]   each hip fractured at separate times  . INSERT / REPLACE / REMOVE PACEMAKER    . IR EXCHANGE BILIARY DRAIN  10/05/2019  . TOTAL HIP ARTHROPLASTY Right 04/20/2018   Procedure: TOTAL HIP CONVERSION;  Surgeon: Kennedy Bucker, MD;  Location: [facility] ORS;  Service: Orthopedics;  Laterality: Right;    There were no vitals filed for this visit.   Subjective Assessment - 07/23/20 1654    Subjective Patient's son reports him and his mother (the patient) were both diagnosised with COVID >10 days ago. Patient's son states the patient has been feeling "sluggish" at home.    Pertinent History ECHO  09/02/19: Moderate MVR, moderate TVR, mild AVR, EF: 45-50%. Recent Adjustments to apixaban and furosemide doses based on age/weight. Most recent Hb: 8.8 on iron supplementation, per PCP note down from baseline of 11.6 (09/14/19). Pt recently had a sleep study completed showing nocturnal hypoxia now on O2 over-night.    Patient Stated Goals improved functional tolerance to AMB regarding breathing limitations    Currently in Pain? No/denies               TREATMENT  Therapeutic Exercise -Cardiovascular conditioning for ABLA;Nustep 2x5 min minutes LEVEL 3, Seat 6, Arms 6, SPM cued >55, vitals taken q58min. (SpO2:97% HR:71 bpm), (SpO2:95% HR:70 bpm) -Ambulation using FWW x555ft (SpO2:88% HR:85 bpm; after ambulation)); No AD x121ft -STS 2x10 from green chair (SpO2: 94% HR: 124bpm) (SpO2: 94% HR: 126bpm) -Hip abduction with UE support - x 10 B  - Soccer kicks in standing - 2 min total time  Performed Exercises to improve function capacity     PT Education - 07/23/20 1658    Education Details form/technique with exercise    Person(s) Educated Patient    Methods Explanation;Demonstration    Comprehension Verbalized understanding;Returned demonstration            PT Short Term Goals - 05/19/20 1458      PT SHORT TERM GOAL #1   Title After 4 weeks pt will tolerate 4 minutes sustained AMB c gait  speed > 0.34m/s to improve capacity to perform safe household distance AMB.    Baseline At evaluation 12/25/19: 3 minute tolerance at 0.52m/s c RW; 05/19/20: 66ft 4:35 min    Time 4    Period Weeks    Status Achieved    Target Date 01/22/20      PT SHORT TERM GOAL #2   Title After 4 weeks pt to demonstrate improved 5xSTS from 19" surface, hands free in <16sec.    Baseline At eval 12/25/19: 20sec from chair with hand push-off.: 05/19/20: 17.5 in 11.85 sec    Time 4    Period Weeks    Status Achieved    Target Date 01/22/20             PT Long Term Goals - 05/19/20 1432      PT LONG TERM  GOAL #1   Title After 8 weeks pt to demonstrate ability to perform 5 continuous minutes AMB @ >0.80m/s c RW. (54ft in )    Baseline 329ft; 05/22/2019: 648ft; 06/19/2019: 540ft; 08/01/2019: at eval: 3 minutes only or 361ft.; 03/25/20: 416 in 5 minutes; 05/19/20: 616ft in 4:35 min    Time 8    Period Weeks    Status Achieved      PT LONG TERM GOAL #2   Title After 8 weeks pt will demonstrate improve SLS >10sec BILAT at supervision level, no UE for recovery.    Baseline At eval 12/25/19: RLE ~3sec; LLE ~1sec; 03/25/20: RLE ~5sec  LLE ~5sec; 05/19/20: RLE 8sec  LLE 9sec    Time 8    Period Weeks    Status On-going      PT LONG TERM GOAL #3   Title At 12 weeks pt will tolerate 15 minutes moderate aerobic actiivty on recumbent stepper with supplemental O2 PRN to prepar for community based exercise compliance at DC.    Baseline Not performing; 03/25/20: 10 minutes    Time 12    Period Weeks    Status Achieved      PT LONG TERM GOAL #4   Title In 12 weeks patient will improve 5 x STS to under 12sec to decrease fall risk and improve LE functional strength.    Baseline 5xSTS: 27 sec; 05/22/2019: 20 sec; 06/19/2019: 20sec; 08/01/2019: Eval 12/25/19: 20sec; 03/25/20: 13.5 sec; 05/19/20: 11.85sec    Time 12    Period Weeks    Status Achieved                 Plan - 07/23/20 1701    Clinical Impression Statement Decreased O2 saturation after performing ambulation with FWW during today's session down to 88% saturdation. Rebounds quickly after cueing for pursed lip breathing. Reassessment  not performed during today's session secondary to to recent COVID diagnosis and increased fatigue, will assess further next visit. Patient will benefit from fruther skilled therapy to return to prior level of function.    Personal Factors and Comorbidities Comorbidity 2;Past/Current Experience;Other    Comorbidities CHF, DVT, THA, CVA, "lung disorder"    Examination-Activity Limitations Carry;Lift;Locomotion  Level;Stand;Stairs;Bend;Bathing;Dressing    Examination-Participation Restrictions Cleaning;Community Activity;Meal Prep    Stability/Clinical Decision Making Evolving/Moderate complexity    Rehab Potential Fair    Clinical Impairments Affecting Rehab Potential (+) highly motivated, family support (-) age, CHF, lack of opportunies for physical activity    PT Frequency 2x / week    PT Duration 12 weeks    PT Treatment/Interventions Electrical Stimulation;Iontophoresis 4mg /ml Dexamethasone;Aquatic Therapy;Cryotherapy;Moist Heat;Therapeutic activities;Patient/family education;Manual techniques;Balance  training;Stair training;Gait training;Neuromuscular re-education;Therapeutic exercise;Passive range of motion    PT Next Visit Plan Continue LE Strengthening/endurance    PT Home Exercise Plan not yet established    Consulted and Agree with Plan of Care Patient;Family member/caregiver    Family Member Consulted Son           Patient will benefit from skilled therapeutic intervention in order to improve the following deficits and impairments:  Abnormal gait,Pain,Decreased coordination,Decreased mobility,Increased muscle spasms,Decreased endurance,Decreased range of motion,Decreased strength,Decreased balance,Difficulty walking,Decreased activity tolerance,Cardiopulmonary status limiting activity,Dizziness  Visit Diagnosis: Muscle weakness (generalized)  Difficulty in walking, not elsewhere classified     Problem List Patient Active Problem List   Diagnosis Date Noted  . Malnutrition of moderate degree 11/08/2019  . COPD with acute exacerbation ([place]) 11/06/2019  . S/P laparoscopic cholecystectomy 10/30/19 11/06/2019  . CHF (congestive heart failure) ([place]) 11/06/2019  . CKD (chronic kidney disease) stage 3, GFR 30-59 ml/min (HCC) 11/06/2019  . Acute on chronic systolic CHF (congestive heart failure) ([place]) 11/06/2019  . Diastolic CHF ([place]) 70/35/0093  . Acute respiratory failure with  hypoxia ([place])   . Acute kidney injury superimposed on CKD ([place])   . Acute cholecystitis 08/31/2019  . RUQ pain   . Chronic obstructive pulmonary disease ([place])   . Acute DVT (deep venous thrombosis) ([place]) 04/29/2018  . 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 Dementia due to medical condition without behavioral disturbance ([place]) 05/04/2013  . AF (paroxysmal atrial fibrillation) ([place])   . Chronic systolic CHF (congestive heart failure) ([place])   . Osteoporosis, post-menopausal   . Osteoarthritis, multiple sites     Blythe Stanford, PT DPT 07/23/2020, 5:12 PM  [facility] [address] [place], [address] Phone: [phone]   Fax:  [phone]  Name: Diana Powell MRN: [id number] Date of Birth: 06/21/1927

## 2020-08-05 ENCOUNTER — Other Ambulatory Visit: Payer: Self-pay

## 2020-08-05 ENCOUNTER — Ambulatory Visit: Payer: Medicare Other

## 2020-08-05 DIAGNOSIS — R262 Difficulty in walking, not elsewhere classified: Secondary | ICD-10-CM

## 2020-08-05 DIAGNOSIS — M25551 Pain in right hip: Secondary | ICD-10-CM

## 2020-08-05 DIAGNOSIS — M6281 Muscle weakness (generalized): Secondary | ICD-10-CM | POA: Diagnosis not present

## 2020-08-05 NOTE — Therapy (Signed)
[facility] [address] [place], [address] Phone: [phone]   Fax:  [phone]  Physical Therapy Treatment  Patient Details  Name: Diana Powell MRN: [id number] Date of Birth: 09/28/1926 Referring Provider (PT): Clarisa Kindred, FNP   Encounter Date: 08/05/2020   PT End of Session - 08/05/20 1522    Visit Number 31    Number of Visits 32    Date for PT Re-Evaluation 07/14/20    Authorization Type 7/10    PT Start Time 1430    PT Stop Time 1515    PT Time Calculation (min) 45 min    Equipment Utilized During Treatment Gait belt    Activity Tolerance Patient limited by fatigue;Patient tolerated treatment well;Treatment limited secondary to medical complications (Comment)    Behavior During Therapy [facility] for tasks assessed/performed           Past Medical History:  Diagnosis Date  . Atrial fibrillation (HCC)   . CHF (congestive heart failure) (HCC)   . Chronic systolic heart failure (HCC)   . Complication of anesthesia    CONFUSION   . COPD (chronic obstructive pulmonary disease) (HCC)   . Osteoarthritis, multiple sites   . Osteoporosis, post-menopausal   . Presence of permanent cardiac pacemaker     Past Surgical History:  Procedure Laterality Date  . CARDIOVERSION  ~2009  . HIP FRACTURE SURGERY Bilateral [phone]   each hip fractured at separate times  . INSERT / REPLACE / REMOVE PACEMAKER    . IR EXCHANGE BILIARY DRAIN  10/05/2019  . TOTAL HIP ARTHROPLASTY Right 04/20/2018   Procedure: TOTAL HIP CONVERSION;  Surgeon: Kennedy Bucker, MD;  Location: [facility] ORS;  Service: Orthopedics;  Laterality: Right;    There were no vitals filed for this visit.   Subjective Assessment - 08/05/20 1514    Subjective --    Patient is accompained by: --    Pertinent History --    Limitations --    How long can you sit comfortably? --    How long can you stand comfortably? --    How long can you walk  comfortably? --    Patient Stated Goals --    Pain Onset --               TREATMENT  Therapeutic Exercise -Cardiovascular conditioning for ABLA;Nustep 2x5 min minutes LEVEL 3, Seat 6, Arms 6, SPM cued >55, vitals taken q92min.(SpO2:96% HR:70 bpm), (SpO2:96% HR:71bpm) -Ambulation using FWWx545ft (SpO2:95% HR:81 bpm; after ambulation)); No ADx133ft (SpO2 96% HR 85; after ambulation) -STS 2x10 from green chair (SpO2: 94% HR: 123bpm) (SpO2: 96% HR: 125bpm) Stair walking 3x (SpO2:93% HR 92bpm) Performed Exercises to improve function capacity                         PT Education - 08/05/20 1531    Education Details Form/technique with exercises    Person(s) Educated Patient    Methods Explanation;Demonstration    Comprehension Verbalized understanding;Returned demonstration            PT Short Term Goals - 05/19/20 1458      PT SHORT TERM GOAL #1   Title After 4 weeks pt will tolerate 4 minutes sustained AMB c gait speed > 0.26m/s to improve capacity to perform safe household distance AMB.    Baseline At evaluation 12/25/19: 3 minute tolerance at 0.22m/s c RW; 05/19/20: 659ft 4:35 min    Time  4    Period Weeks    Status Achieved    Target Date 01/22/20      PT SHORT TERM GOAL #2   Title After 4 weeks pt to demonstrate improved 5xSTS from 19" surface, hands free in <16sec.    Baseline At eval 12/25/19: 20sec from chair with hand push-off.: 05/19/20: 17.5 in 11.85 sec    Time 4    Period Weeks    Status Achieved    Target Date 01/22/20             PT Long Term Goals - 05/19/20 1432      PT LONG TERM GOAL #1   Title After 8 weeks pt to demonstrate ability to perform 5 continuous minutes AMB @ >0.52m/s c RW. (553ft in )    Baseline 367ft; 05/22/2019: 667ft; 06/19/2019: 549ft; 08/01/2019: at eval: 3 minutes only or 353ft.; 03/25/20: 416 in 5 minutes; 05/19/20: 637ft in 4:35 min    Time 8    Period Weeks    Status Achieved      PT LONG TERM  GOAL #2   Title After 8 weeks pt will demonstrate improve SLS >10sec BILAT at supervision level, no UE for recovery.    Baseline At eval 12/25/19: RLE ~3sec; LLE ~1sec; 03/25/20: RLE ~5sec  LLE ~5sec; 05/19/20: RLE 8sec  LLE 9sec    Time 8    Period Weeks    Status On-going      PT LONG TERM GOAL #3   Title At 12 weeks pt will tolerate 15 minutes moderate aerobic actiivty on recumbent stepper with supplemental O2 PRN to prepar for community based exercise compliance at DC.    Baseline Not performing; 03/25/20: 10 minutes    Time 12    Period Weeks    Status Achieved      PT LONG TERM GOAL #4   Title In 12 weeks patient will improve 5 x STS to under 12sec to decrease fall risk and improve LE functional strength.    Baseline 5xSTS: 27 sec; 05/22/2019: 20 sec; 06/19/2019: 20sec; 08/01/2019: Eval 12/25/19: 20sec; 03/25/20: 13.5 sec; 05/19/20: 11.85sec    Time 12    Period Weeks    Status Achieved                 Plan - 08/05/20 1515    Clinical Impression Statement O2 Saturation was more consistent today down to 95% with FWW ambulation. Used pursed lip breathing during recovery. Patient became fatigued more than normal with walking 522ft. Patient remains highly motivated to participaite in therapy. Patient will benefit from further skilled therapy to return to prior level of function.    Personal Factors and Comorbidities Comorbidity 2;Past/Current Experience;Other    Comorbidities CHF, DVT, THA, CVA, "lung disorder"    Examination-Activity Limitations Carry;Lift;Locomotion Level;Stand;Stairs;Bend;Bathing;Dressing    Examination-Participation Restrictions Cleaning;Community Activity;Meal Prep    Stability/Clinical Decision Making Evolving/Moderate complexity    Rehab Potential Fair    Clinical Impairments Affecting Rehab Potential (+) highly motivated, family support (-) age, CHF, lack of opportunies for physical activity    PT Frequency 2x / week    PT Duration 12 weeks    PT  Treatment/Interventions Electrical Stimulation;Iontophoresis 4mg /ml Dexamethasone;Aquatic Therapy;Cryotherapy;Moist Heat;Therapeutic activities;Patient/family education;Manual techniques;Balance training;Stair training;Gait training;Neuromuscular re-education;Therapeutic exercise;Passive range of motion    PT Next Visit Plan Continue LE Strengthening/endurance/ Recert including visit from 08/05/20    PT Home Exercise Plan not yet established    Consulted and Agree with Plan of Care  Patient;Family member/caregiver    Family Member Consulted Son           Patient will benefit from skilled therapeutic intervention in order to improve the following deficits and impairments:  Abnormal gait,Pain,Decreased coordination,Decreased mobility,Increased muscle spasms,Decreased endurance,Decreased range of motion,Decreased strength,Decreased balance,Difficulty walking,Decreased activity tolerance,Cardiopulmonary status limiting activity,Dizziness  Visit Diagnosis: Muscle weakness (generalized)  Difficulty in walking, not elsewhere classified  Pain in right hip     Problem List Patient Active Problem List   Diagnosis Date Noted  . Malnutrition of moderate degree 11/08/2019  . COPD with acute exacerbation (HCC) 11/06/2019  . S/P laparoscopic cholecystectomy 10/30/19 11/06/2019  . CHF (congestive heart failure) (HCC) 11/06/2019  . CKD (chronic kidney disease) stage 3, GFR 30-59 ml/min (HCC) 11/06/2019  . Acute on chronic systolic CHF (congestive heart failure) (HCC) 11/06/2019  . Diastolic CHF (HCC) 11/06/2019  . Acute respiratory failure with hypoxia (HCC)   . Acute kidney injury superimposed on CKD (HCC)   . Acute cholecystitis 08/31/2019  . RUQ pain   . Chronic obstructive pulmonary disease (HCC)   . Acute DVT (deep venous thrombosis) (HCC) 04/29/2018  . 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  AF (paroxysmal atrial fibrillation) (HCC)   . Chronic systolic CHF (congestive heart failure) (HCC)   . Osteoporosis, post-menopausal   . Osteoarthritis, multiple sites     Silvano Rusk 08/05/2020, 3:32 PM  [facility] [address] [place], [address] Phone: [phone]   Fax:  [phone]  Name: Diana Powell MRN: [id number] Date of Birth: 07-Jul-1927

## 2020-08-13 ENCOUNTER — Ambulatory Visit: Payer: Medicare Other

## 2020-08-13 ENCOUNTER — Other Ambulatory Visit: Payer: Self-pay

## 2020-08-13 DIAGNOSIS — M25551 Pain in right hip: Secondary | ICD-10-CM

## 2020-08-13 DIAGNOSIS — R262 Difficulty in walking, not elsewhere classified: Secondary | ICD-10-CM

## 2020-08-13 DIAGNOSIS — M6281 Muscle weakness (generalized): Secondary | ICD-10-CM | POA: Diagnosis not present

## 2020-08-13 DIAGNOSIS — M25632 Stiffness of left wrist, not elsewhere classified: Secondary | ICD-10-CM

## 2020-08-13 DIAGNOSIS — M25532 Pain in left wrist: Secondary | ICD-10-CM

## 2020-08-13 NOTE — Therapy (Signed)
[facility] [address], [place], [address] Phone: [phone]   Fax:  [phone]  Physical Therapy Treatment  Patient Details  Name: Diana Powell MRN: [id number] Date of Birth: 1927-05-12 Referring Provider (PT): Clarisa Kindred, FNP   Encounter Date: 08/13/2020   PT End of Session - 08/13/20 1608    Visit Number 32    Number of Visits 32    Date for PT Re-Evaluation 07/14/20    Authorization Type 7/10    PT Start Time 1515    PT Stop Time 1600    PT Time Calculation (min) 45 min    Equipment Utilized During Treatment Gait belt    Activity Tolerance Patient limited by fatigue;Patient tolerated treatment well;Treatment limited secondary to medical complications (Comment)    Behavior During Therapy [facility] for tasks assessed/performed           Past Medical History:  Diagnosis Date  . Atrial fibrillation (HCC)   . CHF (congestive heart failure) (HCC)   . Chronic systolic heart failure (HCC)   . Complication of anesthesia    CONFUSION   . COPD (chronic obstructive pulmonary disease) (HCC)   . Osteoarthritis, multiple sites   . Osteoporosis, post-menopausal   . Presence of permanent cardiac pacemaker     Past Surgical History:  Procedure Laterality Date  . CARDIOVERSION  ~2009  . HIP FRACTURE SURGERY Bilateral [phone]   each hip fractured at separate times  . INSERT / REPLACE / REMOVE PACEMAKER    . IR EXCHANGE BILIARY DRAIN  10/05/2019  . TOTAL HIP ARTHROPLASTY Right 04/20/2018   Procedure: TOTAL HIP CONVERSION;  Surgeon: Kennedy Bucker, MD;  Location: [facility] ORS;  Service: Orthopedics;  Laterality: Right;    There were no vitals filed for this visit.   Subjective Assessment - 08/13/20 1601    Subjective Patient's son reports she has been feeling good this week. Has more energy then last week and came today with much less fatigue.    Patient is accompained by: Family member    Pertinent History  ECHO 09/02/19: Moderate MVR, moderate TVR, mild AVR, EF: 45-50%. Recent Adjustments to apixaban and furosemide doses based on age/weight. Most recent Hb: 8.8 on iron supplementation, per PCP note down from baseline of 11.6 (09/14/19). Pt recently had a sleep study completed showing nocturnal hypoxia now on O2 over-night.    Limitations Lifting;Standing;Walking    How long can you sit comfortably? unlimited    How long can you stand comfortably? 15-20 minutes    How long can you walk comfortably? unknown, has been mostly performing household mobility since DC    Patient Stated Goals improved functional tolerance to AMB regarding breathing limitations    Currently in Pain? No/denies    Pain Onset More than a month ago           Therapeutic Exercise   Cardiovascular conditioning for ABLA;Nustep 2x5 min minutes LEVEL 3, Seat 6, Arms 6, SPM cued >55, vitals taken q29min. (SpO2:94% HR:9 0 bpm), (SpO2:96% HR:71 bpm) -Ambulation using FWW x548ft (SpO2:96% HR:86 bpm; after ambulation)); No AD x162ft (SpO2 95% HR 95; after ambulation) Side stepping 3x 10 (SpO2:98%, 82BPM)(96%, SpO2: 82%) -STS 2x10 from green chair (SpO2: 96% HR: 127 bpm) (SpO2: 98% HR: 128bpm) Stair walking 3x (SpO2:94% HR 84bpm)  Performed Exercises to improve function capacity         PT Education - 08/13/20 1603    Education Details Form/technique with exercises  Person(s) Educated Patient;Child(ren)    Methods Explanation;Demonstration    Comprehension Returned demonstration;Verbalized understanding            PT Short Term Goals - 05/19/20 1458      PT SHORT TERM GOAL #1   Title After 4 weeks pt will tolerate 4 minutes sustained AMB c gait speed > 0.15m/s to improve capacity to perform safe household distance AMB.    Baseline At evaluation 12/25/19: 3 minute tolerance at 0.21m/s c RW; 05/19/20: 633ft 4:35 min    Time 4    Period Weeks    Status Achieved    Target Date 01/22/20      PT SHORT TERM GOAL #2   Title  After 4 weeks pt to demonstrate improved 5xSTS from 19" surface, hands free in <16sec.    Baseline At eval 12/25/19: 20sec from chair with hand push-off.: 05/19/20: 17.5 in 11.85 sec    Time 4    Period Weeks    Status Achieved    Target Date 01/22/20             PT Long Term Goals - 05/19/20 1432      PT LONG TERM GOAL #1   Title After 8 weeks pt to demonstrate ability to perform 5 continuous minutes AMB @ >0.5m/s c RW. (565ft in )    Baseline 389ft; 05/22/2019: 676ft; 06/19/2019: 572ft; 08/01/2019: at eval: 3 minutes only or 381ft.; 03/25/20: 416 in 5 minutes; 05/19/20: 631ft in 4:35 min    Time 8    Period Weeks    Status Achieved      PT LONG TERM GOAL #2   Title After 8 weeks pt will demonstrate improve SLS >10sec BILAT at supervision level, no UE for recovery.    Baseline At eval 12/25/19: RLE ~3sec; LLE ~1sec; 03/25/20: RLE ~5sec  LLE ~5sec; 05/19/20: RLE 8sec  LLE 9sec    Time 8    Period Weeks    Status On-going      PT LONG TERM GOAL #3   Title At 12 weeks pt will tolerate 15 minutes moderate aerobic actiivty on recumbent stepper with supplemental O2 PRN to prepar for community based exercise compliance at DC.    Baseline Not performing; 03/25/20: 10 minutes    Time 12    Period Weeks    Status Achieved      PT LONG TERM GOAL #4   Title In 12 weeks patient will improve 5 x STS to under 12sec to decrease fall risk and improve LE functional strength.    Baseline 5xSTS: 27 sec; 05/22/2019: 20 sec; 06/19/2019: 20sec; 08/01/2019: Eval 12/25/19: 20sec; 03/25/20: 13.5 sec; 05/19/20: 11.85sec    Time 12    Period Weeks    Status Achieved                 Plan - 08/13/20 1604    Clinical Impression Statement Performed 150 ft of ambulation without an assitive device, and 20 ft of side stepping without an assistive device or assitacne from PT. Patient showed less fatigue then last ession and is highly motivated to participate in therapy. Patient will benefit from furhter  skilled therapy to maintain level of funciton and independence.    Personal Factors and Comorbidities Comorbidity 2;Past/Current Experience;Other    Comorbidities CHF, DVT, THA, CVA, "lung disorder"    Examination-Activity Limitations Carry;Lift;Locomotion Level;Stand;Stairs;Bend;Bathing;Dressing    Examination-Participation Restrictions Cleaning;Community Activity;Meal Prep    Stability/Clinical Decision Making Evolving/Moderate complexity    Rehab Potential  Fair    Clinical Impairments Affecting Rehab Potential (+) highly motivated, family support (-) age, CHF, lack of opportunies for physical activity    PT Frequency 2x / week    PT Duration 12 weeks    PT Treatment/Interventions Electrical Stimulation;Iontophoresis 4mg /ml Dexamethasone;Aquatic Therapy;Cryotherapy;Moist Heat;Therapeutic activities;Patient/family education;Manual techniques;Balance training;Stair training;Gait training;Neuromuscular re-education;Therapeutic exercise;Passive range of motion    PT Next Visit Plan Continue LE Strengthening/endurance/ Recert including visit from 08/05/20    PT Home Exercise Plan not yet established    Consulted and Agree with Plan of Care Patient;Family member/caregiver    Family Member Consulted Son           Patient will benefit from skilled therapeutic intervention in order to improve the following deficits and impairments:  Abnormal gait,Pain,Decreased coordination,Decreased mobility,Increased muscle spasms,Decreased endurance,Decreased range of motion,Decreased strength,Decreased balance,Difficulty walking,Decreased activity tolerance,Cardiopulmonary status limiting activity,Dizziness  Visit Diagnosis: Muscle weakness (generalized)  Difficulty in walking, not elsewhere classified  Pain in right hip  Pain in left wrist  Stiffness of left wrist, not elsewhere classified     Problem List Patient Active Problem List   Diagnosis Date Noted  . Malnutrition of moderate degree  11/08/2019  . COPD with acute exacerbation (HCC) 11/06/2019  . S/P laparoscopic cholecystectomy 10/30/19 11/06/2019  . CHF (congestive heart failure) (HCC) 11/06/2019  . CKD (chronic kidney disease) stage 3, GFR 30-59 ml/min (HCC) 11/06/2019  . Acute on chronic systolic CHF (congestive heart failure) (HCC) 11/06/2019  . Diastolic CHF (HCC) 11/06/2019  . Acute respiratory failure with hypoxia (HCC)   . Acute kidney injury superimposed on CKD (HCC)   . Acute cholecystitis 08/31/2019  . RUQ pain   . Chronic obstructive pulmonary disease (HCC)   . Acute DVT (deep venous thrombosis) (HCC) 04/29/2018  . 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 AF (paroxysmal atrial fibrillation) (HCC)   . Chronic systolic CHF (congestive heart failure) (HCC)   . Osteoporosis, post-menopausal   . Osteoarthritis, multiple sites     05/06/2013 SPT 08/13/2020, 4:10 PM  [facility] [address], [place] Phone: [phone]   Fax:  [phone]  Name: Diana Powell MRN: [id number] Date of Birth: July 21, 1926

## 2020-08-18 ENCOUNTER — Ambulatory Visit: Payer: Medicare Other

## 2020-08-20 ENCOUNTER — Ambulatory Visit: Payer: Medicare Other | Attending: Geriatric Medicine

## 2020-08-20 ENCOUNTER — Other Ambulatory Visit: Payer: Self-pay

## 2020-08-20 DIAGNOSIS — M25551 Pain in right hip: Secondary | ICD-10-CM | POA: Diagnosis present

## 2020-08-20 DIAGNOSIS — M6281 Muscle weakness (generalized): Secondary | ICD-10-CM | POA: Diagnosis not present

## 2020-08-20 DIAGNOSIS — R262 Difficulty in walking, not elsewhere classified: Secondary | ICD-10-CM | POA: Diagnosis present

## 2020-08-20 NOTE — Therapy (Signed)
[facility] PHYSICAL AND SPORTS MEDICINE [address], [place], [address] Phone: [phone]   Fax:  [phone]  Physical Therapy Treatment  Patient Details  Name: Diana Powell MRN: [id number] Date of Birth: [birth year] Referring Provider (PT): Clarisa Kindred, FNP   Encounter Date: 08/20/2020   PT End of Session - 08/20/20 1758    Visit Number 33    Number of Visits 33    Date for PT Re-Evaluation 07/14/20    Authorization Type 7/10    PT Start Time 1606    PT Stop Time 1645    PT Time Calculation (min) 39 min    Equipment Utilized During Treatment Gait belt    Activity Tolerance Patient limited by fatigue;Patient tolerated treatment well;Treatment limited secondary to medical complications (Comment)    Behavior During Therapy [facility] for tasks assessed/performed           Past Medical History:  Diagnosis Date  . Atrial fibrillation (HCC)   . CHF (congestive heart failure) (HCC)   . Chronic systolic heart failure (HCC)   . Complication of anesthesia    CONFUSION   . COPD (chronic obstructive pulmonary disease) (HCC)   . Osteoarthritis, multiple sites   . Osteoporosis, post-menopausal   . Presence of permanent cardiac pacemaker     Past Surgical History:  Procedure Laterality Date  . CARDIOVERSION  ~2009  . HIP FRACTURE SURGERY Bilateral [phone]   each hip fractured at separate times  . INSERT / REPLACE / REMOVE PACEMAKER    . IR EXCHANGE BILIARY DRAIN  10/05/2019  . TOTAL HIP ARTHROPLASTY Right 04/20/2018   Procedure: TOTAL HIP CONVERSION;  Surgeon: Kennedy Bucker, MD;  Location: [facility] ORS;  Service: Orthopedics;  Laterality: Right;    There were no vitals filed for this visit.   Subjective Assessment - 08/20/20 1611    Subjective Patient's son reports she has been feeling better then last week. She has been doing exercises in the house and walking around outside of the house. Has more energy this session than last.     Patient is accompained by: Family member    Pertinent History ECHO 09/02/19: Moderate MVR, moderate TVR, mild AVR, EF: 45-50%. Recent Adjustments to apixaban and furosemide doses based on age/weight. Most recent Hb: 8.8 on iron supplementation, per PCP note down from baseline of 11.6 (09/14/19). Pt recently had a sleep study completed showing nocturnal hypoxia now on O2 over-night.    Limitations Lifting;Standing;Walking    How long can you sit comfortably? unlimited    How long can you stand comfortably? 15-20 minutes    How long can you walk comfortably? unknown, has been mostly performing household mobility since DC    Patient Stated Goals improved functional tolerance to AMB regarding breathing limitations    Currently in Pain? No/denies    Pain Onset More than a month ago           Therapeutic Exercise    Cardiovascular conditioning for ABLA;Nustep 2x5 min minutes LEVEL 3, Seat 6, Arms 6, SPM cued >55, vitals taken q29min. (SpO2:96% HR:70 bpm), (SpO2:96% HR:70  bpm) -Ambulation using FWW x534ft (SpO2:97% HR:72 bpm; after ambulation)); No AD x167ft (SpO2 94% HR 85; after ambulation) Side stepping 1x 10 (SpO2:97%, 79BPM)(96%, SpO2: 82%) -STS 2x10 from green chair (SpO2: 98% HR: 127 bpm) (SpO2: 98% HR: 126bpm) Performed Exercises to improve function capacity        PT Education - 08/20/20 1612    Education Details Form/technique  with exercises    Person(s) Educated Patient;Child(ren)    Methods Explanation;Demonstration    Comprehension Verbalized understanding;Returned demonstration            PT Short Term Goals - 05/19/20 1458      PT SHORT TERM GOAL #1   Title After 4 weeks pt will tolerate 4 minutes sustained AMB c gait speed > 0.31m/s to improve capacity to perform safe household distance AMB.    Baseline At evaluation 12/25/19: 3 minute tolerance at 0.45m/s c RW; 05/19/20: 682ft 4:35 min    Time 4    Period Weeks    Status Achieved    Target Date 01/22/20      PT  SHORT TERM GOAL #2   Title After 4 weeks pt to demonstrate improved 5xSTS from 19" surface, hands free in <16sec.    Baseline At eval 12/25/19: 20sec from chair with hand push-off.: 05/19/20: 17.5 in 11.85 sec    Time 4    Period Weeks    Status Achieved    Target Date 01/22/20             PT Long Term Goals - 05/19/20 1432      PT LONG TERM GOAL #1   Title After 8 weeks pt to demonstrate ability to perform 5 continuous minutes AMB @ >0.34m/s c RW. (538ft in )    Baseline 383ft; 05/22/2019: 613ft; 06/19/2019: 513ft; 08/01/2019: at eval: 3 minutes only or 348ft.; 03/25/20: 416 in 5 minutes; 05/19/20: 644ft in 4:35 min    Time 8    Period Weeks    Status Achieved      PT LONG TERM GOAL #2   Title After 8 weeks pt will demonstrate improve SLS >10sec BILAT at supervision level, no UE for recovery.    Baseline At eval 12/25/19: RLE ~3sec; LLE ~1sec; 03/25/20: RLE ~5sec  LLE ~5sec; 05/19/20: RLE 8sec  LLE 9sec    Time 8    Period Weeks    Status On-going      PT LONG TERM GOAL #3   Title At 12 weeks pt will tolerate 15 minutes moderate aerobic actiivty on recumbent stepper with supplemental O2 PRN to prepar for community based exercise compliance at DC.    Baseline Not performing; 03/25/20: 10 minutes    Time 12    Period Weeks    Status Achieved      PT LONG TERM GOAL #4   Title In 12 weeks patient will improve 5 x STS to under 12sec to decrease fall risk and improve LE functional strength.    Baseline 5xSTS: 27 sec; 05/22/2019: 20 sec; 06/19/2019: 20sec; 08/01/2019: Eval 12/25/19: 20sec; 03/25/20: 13.5 sec; 05/19/20: 11.85sec    Time 12    Period Weeks    Status Achieved                 Plan - 08/20/20 1752    Clinical Impression Statement Patient was able to walk with increased gate speed today and completed 200 ft of ambulation without assistive device. Patient showed less fatigue then last session and needed less time for recovery throughout the session. Patient will  benefit from further skilled therapy to maintain level of function and independence.    Personal Factors and Comorbidities Comorbidity 2;Past/Current Experience;Other    Comorbidities CHF, DVT, THA, CVA, "lung disorder"    Examination-Activity Limitations Carry;Lift;Locomotion Level;Stand;Stairs;Bend;Bathing;Dressing    Examination-Participation Restrictions Cleaning;Community Activity;Meal Prep    Stability/Clinical Decision Making Evolving/Moderate complexity  Rehab Potential Fair    Clinical Impairments Affecting Rehab Potential (+) highly motivated, family support (-) age, CHF, lack of opportunies for physical activity    PT Frequency 2x / week    PT Duration 12 weeks    PT Treatment/Interventions Electrical Stimulation;Iontophoresis 4mg /ml Dexamethasone;Aquatic Therapy;Cryotherapy;Moist Heat;Therapeutic activities;Patient/family education;Manual techniques;Balance training;Stair training;Gait training;Neuromuscular re-education;Therapeutic exercise;Passive range of motion    PT Next Visit Plan Continue LE Strengthening/endurance/ Recert including visit from 08/05/20    PT Home Exercise Plan not yet established    Consulted and Agree with Plan of Care Patient;Family member/caregiver    Family Member Consulted Son           Patient will benefit from skilled therapeutic intervention in order to improve the following deficits and impairments:  Abnormal gait,Pain,Decreased coordination,Decreased mobility,Increased muscle spasms,Decreased endurance,Decreased range of motion,Decreased strength,Decreased balance,Difficulty walking,Decreased activity tolerance,Cardiopulmonary status limiting activity,Dizziness  Visit Diagnosis: Muscle weakness (generalized)  Difficulty in walking, not elsewhere classified  Pain in right hip     Problem List Patient Active Problem List   Diagnosis Date Noted  . Malnutrition of moderate degree 11/08/2019  . COPD with acute exacerbation (HCC) 11/06/2019   . S/P laparoscopic cholecystectomy 10/30/19 11/06/2019  . CHF (congestive heart failure) (HCC) 11/06/2019  . CKD (chronic kidney disease) stage 3, GFR 30-59 ml/min (HCC) 11/06/2019  . Acute on chronic systolic CHF (congestive heart failure) (HCC) 11/06/2019  . Diastolic CHF (HCC) 11/06/2019  . Acute respiratory failure with hypoxia (HCC)   . Acute kidney injury superimposed on CKD (HCC)   . Acute cholecystitis 08/31/2019  . RUQ pain   . Chronic obstructive pulmonary disease (HCC)   . Acute DVT (deep venous thrombosis) (HCC) 04/29/2018  . 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 AF (paroxysmal atrial fibrillation) (HCC)   . Chronic systolic CHF (congestive heart failure) (HCC)   . Osteoporosis, post-menopausal   . Osteoarthritis, multiple sites     05/06/2013 SPT 08/20/2020, 6:00 PM  [facility] PHYSICAL AND SPORTS MEDICINE [address], [place] Phone: [phone]   Fax:  [phone]  Name: Diana Powell MRN: Genia Hotter Date of Birth: 1927/01/22

## 2020-08-25 ENCOUNTER — Other Ambulatory Visit: Payer: Self-pay

## 2020-08-25 ENCOUNTER — Ambulatory Visit: Payer: Medicare Other

## 2020-08-25 DIAGNOSIS — M6281 Muscle weakness (generalized): Secondary | ICD-10-CM | POA: Diagnosis not present

## 2020-08-25 DIAGNOSIS — M25551 Pain in right hip: Secondary | ICD-10-CM

## 2020-08-25 DIAGNOSIS — R262 Difficulty in walking, not elsewhere classified: Secondary | ICD-10-CM

## 2020-08-25 NOTE — Therapy (Signed)
[facility] PHYS[facility]ORTS MEDICINE [address], [place], [address] Phone: [phone]   Fax:  [phone]  Physical Therapy Treatment/Recertification/Progress Note  Recertification Period: 05/19/2020-10/20/2020   Patient Details  Name: Diana Powell MRN: [id number] Date of Birth: 1926/08/30 Referring Provider (PT): Clarisa Kindred, FNP   Encounter Date: 08/25/2020   PT End of Session - 08/25/20 1736    Visit Number 34    Number of Visits 33    Date for PT Re-Evaluation 07/14/20    Authorization Type 7/10    PT Start Time 1600    PT Stop Time 1645    PT Time Calculation (min) 45 min    Equipment Utilized During Treatment Gait belt    Activity Tolerance Patient limited by fatigue;Patient tolerated treatment well;Treatment limited secondary to medical complications (Comment)    Behavior During Therapy [facility] for tasks assessed/performed           Past Medical History:  Diagnosis Date  . Atrial fibrillation (HCC)   . CHF (congestive heart failure) (HCC)   . Chronic systolic heart failure (HCC)   . Complication of anesthesia    CONFUSION   . COPD (chronic obstructive pulmonary disease) (HCC)   . Osteoarthritis, multiple sites   . Osteoporosis, post-menopausal   . Presence of permanent cardiac pacemaker     Past Surgical History:  Procedure Laterality Date  . CARDIOVERSION  ~2009  . HIP FRACTURE SURGERY Bilateral [phone]   each hip fractured at separate times  . INSERT / REPLACE / REMOVE PACEMAKER    . IR EXCHANGE BILIARY DRAIN  10/05/2019  . TOTAL HIP ARTHROPLASTY Right 04/20/2018   Procedure: TOTAL HIP CONVERSION;  Surgeon: Kennedy Bucker, MD;  Location: [facility] ORS;  Service: Orthopedics;  Laterality: Right;    There were no vitals filed for this visit.   Subjective Assessment - 08/25/20 1726    Subjective Patients son reports she has been feeling fatigued today. She has continued use of oxygen as needed when at home and  feels short of breath easily.    Patient is accompained by: Family member    Pertinent History ECHO 09/02/19: Moderate MVR, moderate TVR, mild AVR, EF: 45-50%. Recent Adjustments to apixaban and furosemide doses based on age/weight. Most recent Hb: 8.8 on iron supplementation, per PCP note down from baseline of 11.6 (09/14/19). Pt recently had a sleep study completed showing nocturnal hypoxia now on O2 over-night.    Limitations Lifting;Standing;Walking    How long can you sit comfortably? unlimited    How long can you stand comfortably? 15-20 minutes    How long can you walk comfortably? unknown, has been mostly performing household mobility since DC    Patient Stated Goals improved functional tolerance to AMB regarding breathing limitations    Currently in Pain? No/denies    Pain Onset More than a month ago             Therapeutic Exercise   - 628ft (stopped at 5:47 due to fatigue), (SPO2: 95%, HR-79 BPM) Side stepping 2x10 (SpO2:96%, 75BPM)(79BPM, SpO2: 96%) STS 1x10 from green chair (SpO2: 98% HR: 126 bpm) (SpO2: 98% HR: 126bpm)  Single Leg balance- x5 each side for as long as possible (L 7 sec best, R 4 sec best) Steps- 2x5 up and down Performed Exercises to improve aerobic function capacity          PT Education - 08/25/20 1727    Education Details Form/technique with exercises  Person(s) Educated Patient;Child(ren)    Methods Explanation;Demonstration    Comprehension Verbalized understanding;Returned demonstration            PT Short Term Goals - 05/19/20 1458      PT SHORT TERM GOAL #1   Title After 4 weeks pt will tolerate 4 minutes sustained AMB c gait speed > 0.15m/s to improve capacity to perform safe household distance AMB.    Baseline At evaluation 12/25/19: 3 minute tolerance at 0.61m/s c RW; 05/19/20: 684ft 4:35 min    Time 4    Period Weeks    Status Achieved    Target Date 01/22/20      PT SHORT TERM GOAL #2   Title After 4 weeks pt to demonstrate  improved 5xSTS from 19" surface, hands free in <16sec.    Baseline At eval 12/25/19: 20sec from chair with hand push-off.: 05/19/20: 17.5 in 11.85 sec    Time 4    Period Weeks    Status Achieved    Target Date 01/22/20             PT Long Term Goals - 08/25/20 1732      PT LONG TERM GOAL #1   Title After 8 weeks pt to demonstrate ability to perform 5 continuous minutes AMB @ >0.41m/s c RW. (516ft in )    Baseline 338ft; 05/22/2019: 632ft; 06/19/2019: 537ft; 08/01/2019: at eval: 3 minutes only or 323ft.; 03/25/20: 416 in 5 minutes; 05/19/20: 665ft in 4:35 min    Time 8    Period Weeks    Status Achieved      PT LONG TERM GOAL #2   Title After 8 weeks pt will demonstrate improve SLS >10sec BILAT at supervision level, no UE for recovery.    Baseline At eval 12/25/19: RLE ~3sec; LLE ~1sec; 03/25/20: RLE ~5sec  LLE ~5sec; 05/19/20: RLE 8sec  LLE 9sec,   08/25/20: 7LLE, 4RLE    Time 8    Period Weeks    Status On-going    Target Date 10/20/20      PT LONG TERM GOAL #3   Title At 12 weeks pt will tolerate 15 minutes moderate aerobic actiivty on recumbent stepper with supplemental O2 PRN to prepar for community based exercise compliance at DC.    Baseline Not performing; 03/25/20: 10 minutes    Time 12    Period Weeks    Status Achieved      PT LONG TERM GOAL #4   Title In 12 weeks patient will improve 5 x STS to under 12sec to decrease fall risk and improve LE functional strength.    Baseline 5xSTS: 27 sec; 05/22/2019: 20 sec; 06/19/2019: 20sec; 08/01/2019: Eval 12/25/19: 20sec; 03/25/20: 13.5 sec; 05/19/20: 11.85sec    Time 12    Period Weeks    Status Achieved      PT LONG TERM GOAL #5   Title Patient will improve distance to 746ft to show improvment in cardiovascular endurance    Baseline 08/25/20- 659ft (5:47, needed rest)    Time 8    Period Weeks    Status New    Target Date 10/20/20                 Plan - 08/25/20 1728    Clinical Impression Statement  Reassessment of goals today. Patient was able to walk 626ft for the 6 min walk test, and SLS time was 7sec of Left LE and 4 sec on R lower extremity. Patient fatigued  quickly after those tests indicating decreased cardiovascular endurance and decreased balance. Patient was able to perform therapeutic exercise without an increase in pain. Patient will benefit from further skilled therapy to maintain level of independence.    Personal Factors and Comorbidities Comorbidity 2;Past/Current Experience;Other    Comorbidities CHF, DVT, THA, CVA, "lung disorder"    Examination-Activity Limitations Carry;Lift;Locomotion Level;Stand;Stairs;Bend;Bathing;Dressing    Examination-Participation Restrictions Cleaning;Community Activity;Meal Prep    Stability/Clinical Decision Making Evolving/Moderate complexity    Rehab Potential Fair    Clinical Impairments Affecting Rehab Potential (+) highly motivated, family support (-) age, CHF, lack of opportunies for physical activity    PT Frequency 2x / week    PT Duration 12 weeks    PT Treatment/Interventions Electrical Stimulation;Iontophoresis 4mg /ml Dexamethasone;Aquatic Therapy;Cryotherapy;Moist Heat;Therapeutic activities;Patient/family education;Manual techniques;Balance training;Stair training;Gait training;Neuromuscular re-education;Therapeutic exercise;Passive range of motion    PT Next Visit Plan Continue LE Strengthening/endurance/ Recert including visit from 08/05/20    PT Home Exercise Plan not yet established    Consulted and Agree with Plan of Care Patient;Family member/caregiver    Family Member Consulted Son           Patient will benefit from skilled therapeutic intervention in order to improve the following deficits and impairments:  Abnormal gait,Pain,Decreased coordination,Decreased mobility,Increased muscle spasms,Decreased endurance,Decreased range of motion,Decreased strength,Decreased balance,Difficulty walking,Decreased activity  tolerance,Cardiopulmonary status limiting activity,Dizziness  Visit Diagnosis: Muscle weakness (generalized)  Difficulty in walking, not elsewhere classified  Pain in right hip     Problem List Patient Active Problem List   Diagnosis Date Noted  . Malnutrition of moderate degree 11/08/2019  . COPD with acute exacerbation (HCC) 11/06/2019  . S/P laparoscopic cholecystectomy 10/30/19 11/06/2019  . CHF (congestive heart failure) (HCC) 11/06/2019  . CKD (chronic kidney disease) stage 3, GFR 30-59 ml/min (HCC) 11/06/2019  . Acute on chronic systolic CHF (congestive heart failure) (HCC) 11/06/2019  . Diastolic CHF (HCC) 11/06/2019  . Acute respiratory failure with hypoxia (HCC)   . Acute kidney injury superimposed on CKD (HCC)   . Acute cholecystitis 08/31/2019  . RUQ pain   . Chronic obstructive pulmonary disease (HCC)   . Acute DVT (deep venous thrombosis) (HCC) 04/29/2018  . 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 AF (paroxysmal atrial fibrillation) (HCC)   . Chronic systolic CHF (congestive heart failure) (HCC)   . Osteoporosis, post-menopausal   . Osteoarthritis, multiple sites     05/06/2013 SPT 08/25/2020, 5:38 PM 10/23/2020, PT, DPT   [facility] PHYSICAL AND SPORTS MEDICINE [address], [place] Phone: [phone]   Fax:  [phone]  Name: Diana Powell MRN: Genia Hotter Date of Birth: 24-Nov-1926

## 2020-09-02 ENCOUNTER — Other Ambulatory Visit: Payer: Self-pay

## 2020-09-02 ENCOUNTER — Ambulatory Visit: Payer: Medicare Other

## 2020-09-02 DIAGNOSIS — M6281 Muscle weakness (generalized): Secondary | ICD-10-CM

## 2020-09-02 DIAGNOSIS — R262 Difficulty in walking, not elsewhere classified: Secondary | ICD-10-CM

## 2020-09-02 NOTE — Therapy (Signed)
[facility] [address] [place], [address] Phone: [phone]   Fax:  [phone]  Physical Therapy Treatment  Patient Details  Name: Diana Powell MRN: [id number] Date of Birth: [birth year] Referring Provider (PT): Clarisa Kindred, FNP   Encounter Date: 09/02/2020   PT End of Session - 09/02/20 1522    Visit Number 35    Number of Visits 33    PT Start Time 1515    PT Stop Time 1600    PT Time Calculation (min) 45 min    Equipment Utilized During Treatment Gait belt    Activity Tolerance Patient limited by fatigue;Patient tolerated treatment well;Treatment limited secondary to medical complications (Comment)    Behavior During Therapy [facility] for tasks assessed/performed           Past Medical History:  Diagnosis Date  . Atrial fibrillation (HCC)   . CHF (congestive heart failure) (HCC)   . Chronic systolic heart failure (HCC)   . Complication of anesthesia    CONFUSION   . COPD (chronic obstructive pulmonary disease) (HCC)   . Osteoarthritis, multiple sites   . Osteoporosis, post-menopausal   . Presence of permanent cardiac pacemaker     Past Surgical History:  Procedure Laterality Date  . CARDIOVERSION  ~2009  . HIP FRACTURE SURGERY Bilateral [phone]   each hip fractured at separate times  . INSERT / REPLACE / REMOVE PACEMAKER    . IR EXCHANGE BILIARY DRAIN  10/05/2019  . TOTAL HIP ARTHROPLASTY Right 04/20/2018   Procedure: TOTAL HIP CONVERSION;  Surgeon: Kennedy Bucker, MD;  Location: [facility] ORS;  Service: Orthopedics;  Laterality: Right;    There were no vitals filed for this visit.   Subjective Assessment - 09/02/20 1517    Subjective Pt's son reports pt has been using a little oxygen at home this morning.  She saw her PCP last week for a regular check up.  There are no new updates to report after that appointment.  He states that she did not try the new exercises at home yet.    Patient is  accompained by: Family member    Pertinent History ECHO 09/02/19: Moderate MVR, moderate TVR, mild AVR, EF: 45-50%. Recent Adjustments to apixaban and furosemide doses based on age/weight. Most recent Hb: 8.8 on iron supplementation, per PCP note down from baseline of 11.6 (09/14/19). Pt recently had a sleep study completed showing nocturnal hypoxia now on O2 over-night.    Limitations Lifting;Standing;Walking    How long can you sit comfortably? unlimited    How long can you stand comfortably? 15-20 minutes    How long can you walk comfortably? unknown, has been mostly performing household mobility since DC    Patient Stated Goals improved functional tolerance to AMB regarding breathing limitations    Pain Onset More than a month ago                                     PT Education - 09/02/20 1521    Education Details form/technique with exercises    Person(s) Educated Patient;Child(ren)    Methods Explanation;Demonstration    Comprehension Verbalized understanding;Returned demonstration            PT Short Term Goals - 05/19/20 1458      PT SHORT TERM GOAL #1   Title After 4 weeks pt will tolerate 4 minutes sustained AMB  c gait speed > 0.64m/s to improve capacity to perform safe household distance AMB.    Baseline At evaluation 12/25/19: 3 minute tolerance at 0.28m/s c RW; 05/19/20: 647ft 4:35 min    Time 4    Period Weeks    Status Achieved    Target Date 01/22/20      PT SHORT TERM GOAL #2   Title After 4 weeks pt to demonstrate improved 5xSTS from 19" surface, hands free in <16sec.    Baseline At eval 12/25/19: 20sec from chair with hand push-off.: 05/19/20: 17.5 in 11.85 sec    Time 4    Period Weeks    Status Achieved    Target Date 01/22/20             PT Long Term Goals - 08/25/20 1732      PT LONG TERM GOAL #1   Title After 8 weeks pt to demonstrate ability to perform 5 continuous minutes AMB @ >0.23m/s c RW. (57ft in )    Baseline  379ft; 05/22/2019: 660ft; 06/19/2019: 557ft; 08/01/2019: at eval: 3 minutes only or 335ft.; 03/25/20: 416 in 5 minutes; 05/19/20: 614ft in 4:35 min    Time 8    Period Weeks    Status Achieved      PT LONG TERM GOAL #2   Title After 8 weeks pt will demonstrate improve SLS >10sec BILAT at supervision level, no UE for recovery.    Baseline At eval 12/25/19: RLE ~3sec; LLE ~1sec; 03/25/20: RLE ~5sec  LLE ~5sec; 05/19/20: RLE 8sec  LLE 9sec,   08/25/20: 7LLE, 4RLE    Time 8    Period Weeks    Status On-going    Target Date 10/20/20      PT LONG TERM GOAL #3   Title At 12 weeks pt will tolerate 15 minutes moderate aerobic actiivty on recumbent stepper with supplemental O2 PRN to prepar for community based exercise compliance at DC.    Baseline Not performing; 03/25/20: 10 minutes    Time 12    Period Weeks    Status Achieved      PT LONG TERM GOAL #4   Title In 12 weeks patient will improve 5 x STS to under 12sec to decrease fall risk and improve LE functional strength.    Baseline 5xSTS: 27 sec; 05/22/2019: 20 sec; 06/19/2019: 20sec; 08/01/2019: Eval 12/25/19: 20sec; 03/25/20: 13.5 sec; 05/19/20: 11.85sec    Time 12    Period Weeks    Status Achieved      PT LONG TERM GOAL #5   Title Patient will improve distance to 727ft to show improvment in cardiovascular endurance    Baseline 08/25/20- 690ft (5:47, needed rest)    Time 8    Period Weeks    Status New    Target Date 10/20/20           Treatment Today: Nustep (seat #7) L3-L4 x 10 minutes for cardiovascular endurance Vitals after:  SpO2 95% 70 HR BPM  Walking with FWW: 5 laps (approx 500 ft) today Vitals after:  SpO2 98% 80 BPM Seated rest break as pt reports fatigue after this and amb with decreased speed on 5th lap today   Single leg balance: with PT SBA using gait belt 10 trials on each starting with hands on rail and then release hands (2-7 sec each) Vitals after:  Sp02 98% HR BPM  Sit to Stand: 2x10 (from standard  height chair), rest break in between session Vitals after:  98% 130 BPM  Side stepping with PT hand hold: 10 ft R, 10 ft L Backwards stepping with PT CGA 2x 10 ft  Walking with PT CGA x 2 laps (200 ft) Vitals after Sp02 96% HR 86       Patient will benefit from skilled therapeutic intervention in order to improve the following deficits and impairments:     Visit Diagnosis: Muscle weakness (generalized)  Difficulty in walking, not elsewhere classified     Problem List Patient Active Problem List   Diagnosis Date Noted  . Malnutrition of moderate degree 11/08/2019  . COPD with acute exacerbation (HCC) 11/06/2019  . S/P laparoscopic cholecystectomy 10/30/19 11/06/2019  . CHF (congestive heart failure) (HCC) 11/06/2019  . CKD (chronic kidney disease) stage 3, GFR 30-59 ml/min (HCC) 11/06/2019  . Acute on chronic systolic CHF (congestive heart failure) (HCC) 11/06/2019  . Diastolic CHF (HCC) 11/06/2019  . Acute respiratory failure with hypoxia (HCC)   . Acute kidney injury superimposed on CKD (HCC)   . Acute cholecystitis 08/31/2019  . RUQ pain   . Chronic obstructive pulmonary disease (HCC)   . Acute DVT (deep venous thrombosis) (HCC) 04/29/2018  . 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 AF (paroxysmal atrial fibrillation) (HCC)   . Chronic systolic CHF (congestive heart failure) (HCC)   . Osteoporosis, post-menopausal   . Osteoarthritis, multiple sites     Ardine Bjork 09/02/2020, 3:33 PM Max Fickle, PT, DPT Physical Therapist - [place]  [facility] PHYSICAL AND SPORTS MEDICINE [address], [place], [address] Phone: [phone]   Fax:  [phone]  Name: Diana Powell MRN: [id number] Date of Birth: 25-Nov-1926

## 2020-09-10 ENCOUNTER — Other Ambulatory Visit: Payer: Self-pay

## 2020-09-10 ENCOUNTER — Ambulatory Visit: Payer: Medicare Other

## 2020-09-10 DIAGNOSIS — R262 Difficulty in walking, not elsewhere classified: Secondary | ICD-10-CM

## 2020-09-10 DIAGNOSIS — M6281 Muscle weakness (generalized): Secondary | ICD-10-CM | POA: Diagnosis not present

## 2020-09-10 NOTE — Therapy (Signed)
[facility]TS MEDIC[facility] [address] [place], [place], [address] Phone: [phone]   Fax:  [phone]  Physical Therapy Treatment  Patient Details  Name: Diana Powell MRN: [id number] Date of Birth: [birth year] Referring Provider (PT): Clarisa Kindred, FNP   Encounter Date: 09/10/2020   PT End of Session - 09/10/20 1604    Visit Number 36    Number of Visits 33    PT Start Time 1515    PT Stop Time 1600    PT Time Calculation (min) 45 min    Equipment Utilized During Treatment Gait belt    Activity Tolerance Patient limited by fatigue;Patient tolerated treatment well;Treatment limited secondary to medical complications (Comment)    Behavior During Therapy [facility] for tasks assessed/performed           Past Medical History:  Diagnosis Date   Atrial fibrillation (HCC)    CHF (congestive heart failure) (HCC)    Chronic systolic heart failure (HCC)    Complication of anesthesia    CONFUSION    COPD (chronic obstructive pulmonary disease) (HCC)    Osteoarthritis, multiple sites    Osteoporosis, post-menopausal    Presence of permanent cardiac pacemaker     Past Surgical History:  Procedure Laterality Date   CARDIOVERSION  ~2009   HIP FRACTURE SURGERY Bilateral [phone]   each hip fractured at separate times   INSERT / REPLACE / REMOVE PACEMAKER     IR EXCHANGE BILIARY DRAIN  10/05/2019   TOTAL HIP ARTHROPLASTY Right 04/20/2018   Procedure: TOTAL HIP CONVERSION;  Surgeon: Kennedy Bucker, MD;  Location: [facility] ORS;  Service: Orthopedics;  Laterality: Right;    There were no vitals filed for this visit.   Subjective Assessment - 09/10/20 1521    Subjective Pt son reports pt has been using supplemental oxygen at home but less this wee is less than last week. Pt states she is feeling not great today.    Patient is accompained by: Family member    Pertinent History ECHO 09/02/19: Moderate MVR, moderate TVR, mild AVR,  EF: 45-50%. Recent Adjustments to apixaban and furosemide doses based on age/weight. Most recent Hb: 8.8 on iron supplementation, per PCP note down from baseline of 11.6 (09/14/19). Pt recently had a sleep study completed showing nocturnal hypoxia now on O2 over-night.    Limitations Lifting;Standing;Walking    How long can you sit comfortably? unlimited    How long can you stand comfortably? 15-20 minutes    How long can you walk comfortably? unknown, has been mostly performing household mobility since DC    Patient Stated Goals improved functional tolerance to AMB regarding breathing limitations    Pain Onset More than a month ago           Treatment Today: Nustep (seat #7) L3-L4 x 10 minutes for cardiovascular endurance Vitals after:  SpO2 96% 70 HR BPM   Walking with FWW: 4 laps (approx 400 ft) today Vitals after:  SpO2 95% 78 BPM Seated rest break as pt reports fatigue after this and amb with decreased speed on 5th lap today   Walking with no FWW SpO2-92%  HR-85bpm  Single leg balance: with PT SBA using gait belt 10 trials on each starting with hands on rail and then release hands (2-7 sec each) Vitals after:  Sp02 95% 80  BPM   Sit to Stand: 2x10 (from standard height chair), rest break in between session Vitals after: 95% 127 BPM  Side stepping with PT hand hold: 10 ft R, 10 ft L   Walking Up stairs with PT CGA x 4  Vitals after Sp02 95% HR 86       Patient will benefit from skilled therapeutic intervention in order to improve the following deficits and impairments:           PT Education - 09/10/20 1522    Education Details form/technique with exericse    Person(s) Educated Patient;Child(ren)    Methods Explanation;Demonstration    Comprehension Verbalized understanding;Returned demonstration            PT Short Term Goals - 05/19/20 1458      PT SHORT TERM GOAL #1   Title After 4 weeks pt will tolerate 4 minutes sustained AMB c gait speed >  0.25m/s to improve capacity to perform safe household distance AMB.    Baseline At evaluation 12/25/19: 3 minute tolerance at 0.38m/s c RW; 05/19/20: 628ft 4:35 min    Time 4    Period Weeks    Status Achieved    Target Date 01/22/20      PT SHORT TERM GOAL #2   Title After 4 weeks pt to demonstrate improved 5xSTS from 19" surface, hands free in <16sec.    Baseline At eval 12/25/19: 20sec from chair with hand push-off.: 05/19/20: 17.5 in 11.85 sec    Time 4    Period Weeks    Status Achieved    Target Date 01/22/20             PT Long Term Goals - 09/02/20 1639      PT LONG TERM GOAL #1   Title After 8 weeks pt to demonstrate ability to perform 5 continuous minutes AMB @ >0.65m/s c RW. (542ft in )    Baseline 372ft; 05/22/2019: 646ft; 06/19/2019: 5106ft; 08/01/2019: at eval: 3 minutes only or 364ft.; 03/25/20: 416 in 5 minutes; 05/19/20: 685ft in 4:35 min    Time 8    Period Weeks    Status Achieved      PT LONG TERM GOAL #2   Title After 8 weeks pt will demonstrate improve SLS >10sec BILAT at supervision level, no UE for recovery.    Baseline At eval 12/25/19: RLE ~3sec; LLE ~1sec; 03/25/20: RLE ~5sec  LLE ~5sec; 05/19/20: RLE 8sec  LLE 9sec,   08/25/20: 7LLE, 4RLE    Time 8    Period Weeks    Status On-going      PT LONG TERM GOAL #3   Title At 12 weeks pt will tolerate 15 minutes moderate aerobic actiivty on recumbent stepper with supplemental O2 PRN to prepar for community based exercise compliance at DC.    Baseline Not performing; 03/25/20: 10 minutes    Time 12    Period Weeks    Status Achieved      PT LONG TERM GOAL #4   Title In 12 weeks patient will improve 5 x STS to under 12sec to decrease fall risk and improve LE functional strength.    Baseline 5xSTS: 27 sec; 05/22/2019: 20 sec; 06/19/2019: 20sec; 08/01/2019: Eval 12/25/19: 20sec; 03/25/20: 13.5 sec; 05/19/20: 11.85sec    Time 12    Period Weeks    Status Achieved      PT LONG TERM GOAL #5   Title Patient will  improve distance to 719ft to show improvment in cardiovascular endurance    Baseline 08/25/20- 646ft (5:47, needed rest)    Time 8    Period  Weeks    Status New                 Plan - 09/10/20 1600    Clinical Impression Statement Patient needed more rest today than last session. Patient SpO2 went down to 92% at end of session when walking without FWW. Patient was able to complete exercises today but was more challeneged by them than last session. Patient performed single leg stance for a longer duration today showing functional carryover between sessions. Patient will benefit frum further skilled therapy to return to PLOF    Personal Factors and Comorbidities Comorbidity 2;Past/Current Experience;Other    Comorbidities CHF, DVT, THA, CVA, "lung disorder"    Examination-Activity Limitations Carry;Lift;Locomotion Level;Stand;Stairs;Bend;Bathing;Dressing    Examination-Participation Restrictions Cleaning;Community Activity;Meal Prep    Stability/Clinical Decision Making Evolving/Moderate complexity    Rehab Potential Fair    Clinical Impairments Affecting Rehab Potential (+) highly motivated, family support (-) age, CHF, lack of opportunies for physical activity    PT Frequency 2x / week    PT Duration 12 weeks    PT Treatment/Interventions Electrical Stimulation;Iontophoresis 4mg /ml Dexamethasone;Aquatic Therapy;Cryotherapy;Moist Heat;Therapeutic activities;Patient/family education;Manual techniques;Balance training;Stair training;Gait training;Neuromuscular re-education;Therapeutic exercise;Passive range of motion    PT Next Visit Plan Continue LE Strengthening/endurance/ Recert including visit from 08/05/20    PT Home Exercise Plan not yet established    Consulted and Agree with Plan of Care Patient;Family member/caregiver    Family Member Consulted Son           Patient will benefit from skilled therapeutic intervention in order to improve the following deficits and  impairments:  Abnormal gait,Pain,Decreased coordination,Decreased mobility,Increased muscle spasms,Decreased endurance,Decreased range of motion,Decreased strength,Decreased balance,Difficulty walking,Decreased activity tolerance,Cardiopulmonary status limiting activity,Dizziness  Visit Diagnosis: Muscle weakness (generalized)  Difficulty in walking, not elsewhere classified     Problem List Patient Active Problem List   Diagnosis Date Noted   Malnutrition of moderate degree 11/08/2019   COPD with acute exacerbation (HCC) 11/06/2019   S/P laparoscopic cholecystectomy 10/30/19 11/06/2019   CHF (congestive heart failure) (HCC) 11/06/2019   CKD (chronic kidney disease) stage 3, GFR 30-59 ml/min (HCC) 11/06/2019   Acute on chronic systolic CHF (congestive heart failure) (HCC) 11/06/2019   Diastolic CHF (HCC) 11/06/2019   Acute respiratory failure with hypoxia (HCC)    Acute kidney injury superimposed on CKD (HCC)    Acute cholecystitis 08/31/2019   RUQ pain    Chronic obstructive pulmonary disease (HCC)    Acute DVT (deep venous thrombosis) (HCC) 04/29/2018   Status post total hip replacement, right 04/20/2018   Dementia due to medical condition without behavioral disturbance (HCC) 05/04/2013   AF (paroxysmal atrial fibrillation) (HCC)    Chronic systolic CHF (congestive heart failure) (HCC)    Osteoporosis, post-menopausal    Osteoarthritis, multiple sites     05/06/2013 09/10/2020, 4:57 PM  09/12/2020 SPT   [facility] [address], [place] Phone: [phone]   Fax:  [phone]  Name: Diana Powell MRN: [id number] Date of Birth: 1927-05-04

## 2020-09-17 ENCOUNTER — Ambulatory Visit: Payer: Medicare Other | Attending: Geriatric Medicine

## 2020-09-17 ENCOUNTER — Other Ambulatory Visit: Payer: Self-pay

## 2020-09-17 DIAGNOSIS — M25632 Stiffness of left wrist, not elsewhere classified: Secondary | ICD-10-CM | POA: Diagnosis present

## 2020-09-17 DIAGNOSIS — M25551 Pain in right hip: Secondary | ICD-10-CM | POA: Diagnosis present

## 2020-09-17 DIAGNOSIS — M25532 Pain in left wrist: Secondary | ICD-10-CM | POA: Insufficient documentation

## 2020-09-17 DIAGNOSIS — R262 Difficulty in walking, not elsewhere classified: Secondary | ICD-10-CM | POA: Diagnosis present

## 2020-09-17 DIAGNOSIS — M6281 Muscle weakness (generalized): Secondary | ICD-10-CM | POA: Insufficient documentation

## 2020-09-17 NOTE — Therapy (Signed)
[place] [facility] MEDICINE [address] [place], [address] Phone: [phone]   Fax:  [phone]  Physical Therapy Treatment  Patient Details  Name: Diana Powell MRN: [id number] Date of Birth: 11-15-1926 Referring Provider (PT): Clarisa Kindred, FNP   Encounter Date: 09/17/2020   PT End of Session - 09/17/20 1441    Visit Number 37    Number of Visits 40    Date for PT Re-Evaluation 10/20/20    Authorization Type Recert to be performed at visit 40    PT Start Time 1430    PT Stop Time 1515    PT Time Calculation (min) 45 min    Equipment Utilized During Treatment Gait belt    Activity Tolerance Patient limited by fatigue;Patient tolerated treatment well;Treatment limited secondary to medical complications (Comment)    Behavior During Therapy [facility] for tasks assessed/performed           Past Medical History:  Diagnosis Date  . Atrial fibrillation (HCC)   . CHF (congestive heart failure) (HCC)   . Chronic systolic heart failure (HCC)   . Complication of anesthesia    CONFUSION   . COPD (chronic obstructive pulmonary disease) (HCC)   . Osteoarthritis, multiple sites   . Osteoporosis, post-menopausal   . Presence of permanent cardiac pacemaker     Past Surgical History:  Procedure Laterality Date  . CARDIOVERSION  ~2009  . HIP FRACTURE SURGERY Bilateral [phone]   each hip fractured at separate times  . INSERT / REPLACE / REMOVE PACEMAKER    . IR EXCHANGE BILIARY DRAIN  10/05/2019  . TOTAL HIP ARTHROPLASTY Right 04/20/2018   Procedure: TOTAL HIP CONVERSION;  Surgeon: Kennedy Bucker, MD;  Location: [facility] ORS;  Service: Orthopedics;  Laterality: Right;    There were no vitals filed for this visit.   Subjective Assessment - 09/17/20 1437    Subjective Patient's son reports the patient has been having increased breathlessness most notably with performing sit to stand with humidity increased.    Patient is accompained  by: Family member    Pertinent History ECHO 09/02/19: Moderate MVR, moderate TVR, mild AVR, EF: 45-50%. Recent Adjustments to apixaban and furosemide doses based on age/weight. Most recent Hb: 8.8 on iron supplementation, per PCP note down from baseline of 11.6 (09/14/19). Pt recently had a sleep study completed showing nocturnal hypoxia now on O2 over-night.    Limitations Lifting;Standing;Walking    How long can you sit comfortably? unlimited    How long can you stand comfortably? 15-20 minutes    How long can you walk comfortably? unknown, has been mostly performing household mobility since DC    Patient Stated Goals improved functional tolerance to AMB regarding breathing limitations    Currently in Pain? No/denies    Pain Onset More than a month ago               Treatment Today: Nustep (seat #7) L3-L4 x 10 minutes for cardiovascular endurance Vitals after:  SpO2 96% 70 HR BPM   Walking with FWW: 5 laps (approx 500 ft) today; x49ft Vitals after:  SpO2 95% 85 BPM Seated rest break as pt reports fatigue after this and amb   Sit to Stand: 2x10 (from standard height chair), rest break in between session Vitals after: 96% 127 BPM   Single leg balance: with PT SBA using gait belt 10 trials on each starting with hands on rail and then release hands (2-7 sec each) Vitals  after:  Sp02 95% 80  BPM       Patient will benefit from skilled therapeutic intervention in order to improve the following deficits and impairments:         PT Education - 09/17/20 1440    Education Details form/technique with exercise    Person(s) Educated Patient    Methods Explanation;Demonstration    Comprehension Verbalized understanding;Returned demonstration            PT Short Term Goals - 05/19/20 1458      PT SHORT TERM GOAL #1   Title After 4 weeks pt will tolerate 4 minutes sustained AMB c gait speed > 0.71m/s to improve capacity to perform safe household distance AMB.    Baseline  At evaluation 12/25/19: 3 minute tolerance at 0.71m/s c RW; 05/19/20: 679ft 4:35 min    Time 4    Period Weeks    Status Achieved    Target Date 01/22/20      PT SHORT TERM GOAL #2   Title After 4 weeks pt to demonstrate improved 5xSTS from 19" surface, hands free in <16sec.    Baseline At eval 12/25/19: 20sec from chair with hand push-off.: 05/19/20: 17.5 in 11.85 sec    Time 4    Period Weeks    Status Achieved    Target Date 01/22/20             PT Long Term Goals - 09/02/20 1639      PT LONG TERM GOAL #1   Title After 8 weeks pt to demonstrate ability to perform 5 continuous minutes AMB @ >0.74m/s c RW. (560ft in )    Baseline 362ft; 05/22/2019: 668ft; 06/19/2019: 570ft; 08/01/2019: at eval: 3 minutes only or 360ft.; 03/25/20: 416 in 5 minutes; 05/19/20: 641ft in 4:35 min    Time 8    Period Weeks    Status Achieved      PT LONG TERM GOAL #2   Title After 8 weeks pt will demonstrate improve SLS >10sec BILAT at supervision level, no UE for recovery.    Baseline At eval 12/25/19: RLE ~3sec; LLE ~1sec; 03/25/20: RLE ~5sec  LLE ~5sec; 05/19/20: RLE 8sec  LLE 9sec,   08/25/20: 7LLE, 4RLE    Time 8    Period Weeks    Status On-going      PT LONG TERM GOAL #3   Title At 12 weeks pt will tolerate 15 minutes moderate aerobic actiivty on recumbent stepper with supplemental O2 PRN to prepar for community based exercise compliance at DC.    Baseline Not performing; 03/25/20: 10 minutes    Time 12    Period Weeks    Status Achieved      PT LONG TERM GOAL #4   Title In 12 weeks patient will improve 5 x STS to under 12sec to decrease fall risk and improve LE functional strength.    Baseline 5xSTS: 27 sec; 05/22/2019: 20 sec; 06/19/2019: 20sec; 08/01/2019: Eval 12/25/19: 20sec; 03/25/20: 13.5 sec; 05/19/20: 11.85sec    Time 12    Period Weeks    Status Achieved      PT LONG TERM GOAL #5   Title Patient will improve distance to 756ft to show improvment in cardiovascular endurance     Baseline 08/25/20- 621ft (5:47, needed rest)    Time 8    Period Weeks    Status New                 Plan -  09/17/20 1442    Clinical Impression Statement Patient with increased difficulty with performing walking and other cardiovascular based exercises today. She requires prolonged rest periods to improve breathlessness today's session. Although patient does experience increased breathlessness during today's session, her O2 sat remains  WNL. Patient demonstrates increased difficulty overall and will benefit from further skilled therapy to return to prior level of function.    Personal Factors and Comorbidities Comorbidity 2;Past/Current Experience;Other    Comorbidities CHF, DVT, THA, CVA, "lung disorder"    Examination-Activity Limitations Carry;Lift;Locomotion Level;Stand;Stairs;Bend;Bathing;Dressing    Examination-Participation Restrictions Cleaning;Community Activity;Meal Prep    Stability/Clinical Decision Making Evolving/Moderate complexity    Rehab Potential Fair    Clinical Impairments Affecting Rehab Potential (+) highly motivated, family support (-) age, CHF, lack of opportunies for physical activity    PT Frequency 2x / week    PT Duration 12 weeks    PT Treatment/Interventions Electrical Stimulation;Iontophoresis 4mg /ml Dexamethasone;Aquatic Therapy;Cryotherapy;Moist Heat;Therapeutic activities;Patient/family education;Manual techniques;Balance training;Stair training;Gait training;Neuromuscular re-education;Therapeutic exercise;Passive range of motion    PT Next Visit Plan Continue LE Strengthening/endurance/ Recert including visit from 08/05/20    PT Home Exercise Plan not yet established    Consulted and Agree with Plan of Care Patient;Family member/caregiver    Family Member Consulted Son           Patient will benefit from skilled therapeutic intervention in order to improve the following deficits and impairments:  Abnormal gait,Pain,Decreased coordination,Decreased  mobility,Increased muscle spasms,Decreased endurance,Decreased range of motion,Decreased strength,Decreased balance,Difficulty walking,Decreased activity tolerance,Cardiopulmonary status limiting activity,Dizziness  Visit Diagnosis: Muscle weakness (generalized)  Difficulty in walking, not elsewhere classified     Problem List Patient Active Problem List   Diagnosis Date Noted  . Malnutrition of moderate degree 11/08/2019  . COPD with acute exacerbation (HCC) 11/06/2019  . S/P laparoscopic cholecystectomy 10/30/19 11/06/2019  . CHF (congestive heart failure) (HCC) 11/06/2019  . CKD (chronic kidney disease) stage 3, GFR 30-59 ml/min (HCC) 11/06/2019  . Acute on chronic systolic CHF (congestive heart failure) (HCC) 11/06/2019  . Diastolic CHF (HCC) 11/06/2019  . Acute respiratory failure with hypoxia (HCC)   . Acute kidney injury superimposed on CKD (HCC)   . Acute cholecystitis 08/31/2019  . RUQ pain   . Chronic obstructive pulmonary disease (HCC)   . Acute DVT (deep venous thrombosis) (HCC) 04/29/2018  . 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 AF (paroxysmal atrial fibrillation) (HCC)   . Chronic systolic CHF (congestive heart failure) (HCC)   . Osteoporosis, post-menopausal   . Osteoarthritis, multiple sites     05/06/2013, PT DPT 09/17/2020, 3:31 PM  [place] [facility] PHYS[facility] AND SPORTS MEDICINE [address], [place] Phone: [phone]   Fax:  [phone]  Name: Diana Powell MRN: Genia Hotter Date of Birth: 16-Jul-1927

## 2020-09-23 ENCOUNTER — Ambulatory Visit: Payer: Medicare Other

## 2020-09-23 ENCOUNTER — Other Ambulatory Visit: Payer: Self-pay

## 2020-09-23 DIAGNOSIS — M6281 Muscle weakness (generalized): Secondary | ICD-10-CM | POA: Diagnosis not present

## 2020-09-23 DIAGNOSIS — R262 Difficulty in walking, not elsewhere classified: Secondary | ICD-10-CM

## 2020-09-23 DIAGNOSIS — M25551 Pain in right hip: Secondary | ICD-10-CM

## 2020-09-23 NOTE — Therapy (Signed)
[place] [facility] PH[facility] [address] [place], [address] Phone: [phone]   Fax:  [phone]  Physical Therapy Treatment  Patient Details  Name: Diana Powell MRN: [id number] Date of Birth: [birth year] Referring Provider (PT): Clarisa Kindred, FNP   Encounter Date: 09/23/2020   PT End of Session - 09/23/20 1309    Visit Number 38    Number of Visits 40    Date for PT Re-Evaluation 10/20/20    Authorization Type Recert to be performed at visit 40    PT Start Time 1300    PT Stop Time 1345    PT Time Calculation (min) 45 min    Equipment Utilized During Treatment Gait belt    Activity Tolerance Patient limited by fatigue;Patient tolerated treatment well;Treatment limited secondary to medical complications (Comment)    Behavior During Therapy [facility] for tasks assessed/performed           Past Medical History:  Diagnosis Date  . Atrial fibrillation (HCC)   . CHF (congestive heart failure) (HCC)   . Chronic systolic heart failure (HCC)   . Complication of anesthesia    CONFUSION   . COPD (chronic obstructive pulmonary disease) (HCC)   . Osteoarthritis, multiple sites   . Osteoporosis, post-menopausal   . Presence of permanent cardiac pacemaker     Past Surgical History:  Procedure Laterality Date  . CARDIOVERSION  ~2009  . HIP FRACTURE SURGERY Bilateral [phone]   each hip fractured at separate times  . INSERT / REPLACE / REMOVE PACEMAKER    . IR EXCHANGE BILIARY DRAIN  10/05/2019  . TOTAL HIP ARTHROPLASTY Right 04/20/2018   Procedure: TOTAL HIP CONVERSION;  Surgeon: Kennedy Bucker, MD;  Location: [facility] ORS;  Service: Orthopedics;  Laterality: Right;    There were no vitals filed for this visit.   Subjective Assessment - 09/23/20 1306    Subjective Patient states she has been tired over the past two days, stating she had slept most of the day two days prior. Overall "sluggish".    Patient is accompained by:  Family member    Pertinent History ECHO 09/02/19: Moderate MVR, moderate TVR, mild AVR, EF: 45-50%. Recent Adjustments to apixaban and furosemide doses based on age/weight. Most recent Hb: 8.8 on iron supplementation, per PCP note down from baseline of 11.6 (09/14/19). Pt recently had a sleep study completed showing nocturnal hypoxia now on O2 over-night.    Limitations Lifting;Standing;Walking    How long can you sit comfortably? unlimited    How long can you stand comfortably? 15-20 minutes    How long can you walk comfortably? unknown, has been mostly performing household mobility since DC    Patient Stated Goals improved functional tolerance to AMB regarding breathing limitations    Currently in Pain? No/denies    Pain Onset More than a month ago              Treatment Today: Nustep (seat #7) L3  x 10 minutes for cardiovascular endurance Vitals after:  SpO2 96% 70 HR BPM   Walking with FWW: 500 ft today; 166ft without AD  Vitals after:  SpO2 94% 85 BPM Seated rest break as pt reports fatigue after this and amb    Sit to Stand: 2x10 (from standard height chair), rest break in between session Vitals after: 96% 130 BPM  Side stepping across floor 20ft Vitals after: 96% 86 BPM   Single leg balance: with PT SBA using gait belt 10  trials on each starting with hands on rail and then release hands (2-7 sec each) Vitals after:  Sp02 95% 80  BPM       Patient will benefit from skilled therapeutic intervention in order to improve the following deficits and impairments:       PT Education - 09/23/20 1308    Education Details form/technique with exercise    Person(s) Educated Patient    Methods Explanation;Demonstration    Comprehension Verbalized understanding;Returned demonstration            PT Short Term Goals - 05/19/20 1458      PT SHORT TERM GOAL #1   Title After 4 weeks pt will tolerate 4 minutes sustained AMB c gait speed > 0.101m/s to improve capacity to  perform safe household distance AMB.    Baseline At evaluation 12/25/19: 3 minute tolerance at 0.18m/s c RW; 05/19/20: 665ft 4:35 min    Time 4    Period Weeks    Status Achieved    Target Date 01/22/20      PT SHORT TERM GOAL #2   Title After 4 weeks pt to demonstrate improved 5xSTS from 19" surface, hands free in <16sec.    Baseline At eval 12/25/19: 20sec from chair with hand push-off.: 05/19/20: 17.5 in 11.85 sec    Time 4    Period Weeks    Status Achieved    Target Date 01/22/20             PT Long Term Goals - 09/02/20 1639      PT LONG TERM GOAL #1   Title After 8 weeks pt to demonstrate ability to perform 5 continuous minutes AMB @ >0.51m/s c RW. (561ft in )    Baseline 320ft; 05/22/2019: 626ft; 06/19/2019: 568ft; 08/01/2019: at eval: 3 minutes only or 332ft.; 03/25/20: 416 in 5 minutes; 05/19/20: 622ft in 4:35 min    Time 8    Period Weeks    Status Achieved      PT LONG TERM GOAL #2   Title After 8 weeks pt will demonstrate improve SLS >10sec BILAT at supervision level, no UE for recovery.    Baseline At eval 12/25/19: RLE ~3sec; LLE ~1sec; 03/25/20: RLE ~5sec  LLE ~5sec; 05/19/20: RLE 8sec  LLE 9sec,   08/25/20: 7LLE, 4RLE    Time 8    Period Weeks    Status On-going      PT LONG TERM GOAL #3   Title At 12 weeks pt will tolerate 15 minutes moderate aerobic actiivty on recumbent stepper with supplemental O2 PRN to prepar for community based exercise compliance at DC.    Baseline Not performing; 03/25/20: 10 minutes    Time 12    Period Weeks    Status Achieved      PT LONG TERM GOAL #4   Title In 12 weeks patient will improve 5 x STS to under 12sec to decrease fall risk and improve LE functional strength.    Baseline 5xSTS: 27 sec; 05/22/2019: 20 sec; 06/19/2019: 20sec; 08/01/2019: Eval 12/25/19: 20sec; 03/25/20: 13.5 sec; 05/19/20: 11.85sec    Time 12    Period Weeks    Status Achieved      PT LONG TERM GOAL #5   Title Patient will improve distance to 753ft  to show improvment in cardiovascular endurance    Baseline 08/25/20- 636ft (5:47, needed rest)    Time 8    Period Weeks    Status New  Plan - 09/23/20 1323    Clinical Impression Statement Patient with improved ability to walk without requiring as many sitting rest breaks. However increased difficulty with performance of sit to stands with increased HR and fatigue after performance. Patient making improvements with walking ability today versus previous session. Patient will benefit from further skilled therpay focused on improving walking function and increased functional capacity.    Personal Factors and Comorbidities Comorbidity 2;Past/Current Experience;Other    Comorbidities CHF, DVT, THA, CVA, "lung disorder"    Examination-Activity Limitations Carry;Lift;Locomotion Level;Stand;Stairs;Bend;Bathing;Dressing    Examination-Participation Restrictions Cleaning;Community Activity;Meal Prep    Stability/Clinical Decision Making Evolving/Moderate complexity    Rehab Potential Fair    Clinical Impairments Affecting Rehab Potential (+) highly motivated, family support (-) age, CHF, lack of opportunies for physical activity    PT Frequency 2x / week    PT Duration 12 weeks    PT Treatment/Interventions Electrical Stimulation;Iontophoresis 4mg /ml Dexamethasone;Aquatic Therapy;Cryotherapy;Moist Heat;Therapeutic activities;Patient/family education;Manual techniques;Balance training;Stair training;Gait training;Neuromuscular re-education;Therapeutic exercise;Passive range of motion    PT Next Visit Plan Continue LE Strengthening/endurance/ Recert including visit from 08/05/20    PT Home Exercise Plan not yet established    Consulted and Agree with Plan of Care Patient;Family member/caregiver    Family Member Consulted Son           Patient will benefit from skilled therapeutic intervention in order to improve the following deficits and impairments:  Abnormal  gait,Pain,Decreased coordination,Decreased mobility,Increased muscle spasms,Decreased endurance,Decreased range of motion,Decreased strength,Decreased balance,Difficulty walking,Decreased activity tolerance,Cardiopulmonary status limiting activity,Dizziness  Visit Diagnosis: Muscle weakness (generalized)  Difficulty in walking, not elsewhere classified  Pain in right hip     Problem List Patient Active Problem List   Diagnosis Date Noted  . Malnutrition of moderate degree 11/08/2019  . COPD with acute exacerbation (HCC) 11/06/2019  . S/P laparoscopic cholecystectomy 10/30/19 11/06/2019  . CHF (congestive heart failure) (HCC) 11/06/2019  . CKD (chronic kidney disease) stage 3, GFR 30-59 ml/min (HCC) 11/06/2019  . Acute on chronic systolic CHF (congestive heart failure) (HCC) 11/06/2019  . Diastolic CHF (HCC) 11/06/2019  . Acute respiratory failure with hypoxia (HCC)   . Acute kidney injury superimposed on CKD (HCC)   . Acute cholecystitis 08/31/2019  . RUQ pain   . Chronic obstructive pulmonary disease (HCC)   . Acute DVT (deep venous thrombosis) (HCC) 04/29/2018  . 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 AF (paroxysmal atrial fibrillation) (HCC)   . Chronic systolic CHF (congestive heart failure) (HCC)   . Osteoporosis, post-menopausal   . Osteoarthritis, multiple sites     05/06/2013, PT DPT 09/23/2020, 1:35 PM  [place] [facility] PHYSICAL AND SPORTS MEDICINE [address], [address], [place] Phone: [phone]   Fax:  [phone]  Name: Diana Powell MRN: Genia Hotter Date of Birth: November 04, 1926

## 2020-10-08 ENCOUNTER — Ambulatory Visit: Payer: Medicare Other

## 2020-10-08 ENCOUNTER — Other Ambulatory Visit: Payer: Self-pay

## 2020-10-08 DIAGNOSIS — R262 Difficulty in walking, not elsewhere classified: Secondary | ICD-10-CM

## 2020-10-08 DIAGNOSIS — M6281 Muscle weakness (generalized): Secondary | ICD-10-CM | POA: Diagnosis not present

## 2020-10-08 NOTE — Therapy (Signed)
[facility] PHYSICAL AND SPORTS MEDICINE [address], [place], [address] Phone: [phone]   Fax:  [phone]  Physical Therapy Treatment  Patient Details  Name: Aristea Posada MRN: [id number] Date of Birth: October 15, 1926 Referring Provider (PT): Clarisa Kindred, FNP   Encounter Date: 10/08/2020   PT End of Session - 10/08/20 1427    Visit Number 39    Number of Visits 40    Date for PT Re-Evaluation 10/20/20    Authorization Type Recert to be performed at visit 40    PT Start Time 1300    PT Stop Time 1345    PT Time Calculation (min) 45 min    Equipment Utilized During Treatment Gait belt    Activity Tolerance Patient limited by fatigue;Patient tolerated treatment well;Treatment limited secondary to medical complications (Comment)    Behavior During Therapy [facility] for tasks assessed/performed           Past Medical History:  Diagnosis Date  . Atrial fibrillation (HCC)   . CHF (congestive heart failure) (HCC)   . Chronic systolic heart failure (HCC)   . Complication of anesthesia    CONFUSION   . COPD (chronic obstructive pulmonary disease) (HCC)   . Osteoarthritis, multiple sites   . Osteoporosis, post-menopausal   . Presence of permanent cardiac pacemaker     Past Surgical History:  Procedure Laterality Date  . CARDIOVERSION  ~2009  . HIP FRACTURE SURGERY Bilateral [phone]   each hip fractured at separate times  . INSERT / REPLACE / REMOVE PACEMAKER    . IR EXCHANGE BILIARY DRAIN  10/05/2019  . TOTAL HIP ARTHROPLASTY Right 04/20/2018   Procedure: TOTAL HIP CONVERSION;  Surgeon: Kennedy Bucker, MD;  Location: [facility] ORS;  Service: Orthopedics;  Laterality: Right;    There were no vitals filed for this visit.   Subjective Assessment - 10/08/20 1306    Subjective Patient's son reports she had gained 4 pounds last night and had to use increased O2 in the meantime. Patient's son states she has been tired today and has been using  O2 around 50% of the time when at home.    Patient is accompained by: Family member    Pertinent History ECHO 09/02/19: Moderate MVR, moderate TVR, mild AVR, EF: 45-50%. Recent Adjustments to apixaban and furosemide doses based on age/weight. Most recent Hb: 8.8 on iron supplementation, per PCP note down from baseline of 11.6 (09/14/19). Pt recently had a sleep study completed showing nocturnal hypoxia now on O2 over-night.    Limitations Lifting;Standing;Walking    How long can you sit comfortably? unlimited    How long can you stand comfortably? 15-20 minutes    How long can you walk comfortably? unknown, has been mostly performing household mobility since DC    Patient Stated Goals improved functional tolerance to AMB regarding breathing limitations    Currently in Pain? No/denies    Pain Onset More than a month ago               Treatment Today: Nustep (seat #7) L3  x 10 minutes for cardiovascular endurance Vitals after:  SpO2 91% 70 HR BPM   Walking with FWW: 400 ft today;  Vitals after:  SpO2 91% 85 BPM Seated rest break as pt reports fatigue after this and amb    Sit to Stand: 2x10 (from standard height chair), rest break in between session Vitals after: 92% 130 BPM     Stairs up/down  4 steps  each trial x 4  Vitals after:  Sp02 91% 95  BPM       Patient will benefit from skilled therapeutic intervention in order to improve the following deficits and impairments:       PT Education - 10/08/20 1328    Education Details form/technique with exercise    Person(s) Educated Patient    Methods Explanation;Demonstration    Comprehension Verbalized understanding;Returned demonstration            PT Short Term Goals - 05/19/20 1458      PT SHORT TERM GOAL #1   Title After 4 weeks pt will tolerate 4 minutes sustained AMB c gait speed > 0.18m/s to improve capacity to perform safe household distance AMB.    Baseline At evaluation 12/25/19: 3 minute tolerance at  0.52m/s c RW; 05/19/20: 639ft 4:35 min    Time 4    Period Weeks    Status Achieved    Target Date 01/22/20      PT SHORT TERM GOAL #2   Title After 4 weeks pt to demonstrate improved 5xSTS from 19" surface, hands free in <16sec.    Baseline At eval 12/25/19: 20sec from chair with hand push-off.: 05/19/20: 17.5 in 11.85 sec    Time 4    Period Weeks    Status Achieved    Target Date 01/22/20             PT Long Term Goals - 09/02/20 1639      PT LONG TERM GOAL #1   Title After 8 weeks pt to demonstrate ability to perform 5 continuous minutes AMB @ >0.28m/s c RW. (536ft in )    Baseline 366ft; 05/22/2019: 658ft; 06/19/2019: 534ft; 08/01/2019: at eval: 3 minutes only or 33ft.; 03/25/20: 416 in 5 minutes; 05/19/20: 636ft in 4:35 min    Time 8    Period Weeks    Status Achieved      PT LONG TERM GOAL #2   Title After 8 weeks pt will demonstrate improve SLS >10sec BILAT at supervision level, no UE for recovery.    Baseline At eval 12/25/19: RLE ~3sec; LLE ~1sec; 03/25/20: RLE ~5sec  LLE ~5sec; 05/19/20: RLE 8sec  LLE 9sec,   08/25/20: 7LLE, 4RLE    Time 8    Period Weeks    Status On-going      PT LONG TERM GOAL #3   Title At 12 weeks pt will tolerate 15 minutes moderate aerobic actiivty on recumbent stepper with supplemental O2 PRN to prepar for community based exercise compliance at DC.    Baseline Not performing; 03/25/20: 10 minutes    Time 12    Period Weeks    Status Achieved      PT LONG TERM GOAL #4   Title In 12 weeks patient will improve 5 x STS to under 12sec to decrease fall risk and improve LE functional strength.    Baseline 5xSTS: 27 sec; 05/22/2019: 20 sec; 06/19/2019: 20sec; 08/01/2019: Eval 12/25/19: 20sec; 03/25/20: 13.5 sec; 05/19/20: 11.85sec    Time 12    Period Weeks    Status Achieved      PT LONG TERM GOAL #5   Title Patient will improve distance to 733ft to show improvment in cardiovascular endurance    Baseline 08/25/20- 636ft (5:47, needed rest)     Time 8    Period Weeks    Status New  Plan - 10/08/20 1437    Clinical Impression Statement Patient with increased difficulty with performing walking with SpO2 dropping to 91% today and taking around 2.5 minutesto rebound >95 %. Less Repetitions performed overall secondary to fatigue. Will continue to monitor symptoms in future sessions. Patient will benefit from further skilled therapy to improve functional capacity.    Personal Factors and Comorbidities Comorbidity 2;Past/Current Experience;Other    Comorbidities CHF, DVT, THA, CVA, "lung disorder"    Examination-Activity Limitations Carry;Lift;Locomotion Level;Stand;Stairs;Bend;Bathing;Dressing    Examination-Participation Restrictions Cleaning;Community Activity;Meal Prep    Stability/Clinical Decision Making Evolving/Moderate complexity    Rehab Potential Fair    Clinical Impairments Affecting Rehab Potential (+) highly motivated, family support (-) age, CHF, lack of opportunies for physical activity    PT Frequency 2x / week    PT Duration 12 weeks    PT Treatment/Interventions Electrical Stimulation;Iontophoresis 4mg /ml Dexamethasone;Aquatic Therapy;Cryotherapy;Moist Heat;Therapeutic activities;Patient/family education;Manual techniques;Balance training;Stair training;Gait training;Neuromuscular re-education;Therapeutic exercise;Passive range of motion    PT Next Visit Plan Continue LE Strengthening/endurance/ Recert including visit from 08/05/20    PT Home Exercise Plan not yet established    Consulted and Agree with Plan of Care Patient;Family member/caregiver    Family Member Consulted Son           Patient will benefit from skilled therapeutic intervention in order to improve the following deficits and impairments:  Abnormal gait,Pain,Decreased coordination,Decreased mobility,Increased muscle spasms,Decreased endurance,Decreased range of motion,Decreased strength,Decreased balance,Difficulty  walking,Decreased activity tolerance,Cardiopulmonary status limiting activity,Dizziness  Visit Diagnosis: Muscle weakness (generalized)  Difficulty in walking, not elsewhere classified     Problem List Patient Active Problem List   Diagnosis Date Noted  . Malnutrition of moderate degree 11/08/2019  . COPD with acute exacerbation (HCC) 11/06/2019  . S/P laparoscopic cholecystectomy 10/30/19 11/06/2019  . CHF (congestive heart failure) (HCC) 11/06/2019  . CKD (chronic kidney disease) stage 3, GFR 30-59 ml/min (HCC) 11/06/2019  . Acute on chronic systolic CHF (congestive heart failure) (HCC) 11/06/2019  . Diastolic CHF (HCC) 11/06/2019  . Acute respiratory failure with hypoxia (HCC)   . Acute kidney injury superimposed on CKD (HCC)   . Acute cholecystitis 08/31/2019  . RUQ pain   . Chronic obstructive pulmonary disease (HCC)   . Acute DVT (deep venous thrombosis) (HCC) 04/29/2018  . 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 AF (paroxysmal atrial fibrillation) (HCC)   . Chronic systolic CHF (congestive heart failure) (HCC)   . Osteoporosis, post-menopausal   . Osteoarthritis, multiple sites     05/06/2013, PT DPT 10/08/2020, 2:49 PM  [place] [facility] PHYSICAL AND SPORTS MEDICINE [address], [place] Phone: [phone]   Fax:  [phone]  Name: Judea Fennimore MRN: Genia Hotter Date of Birth: April 22, 1927

## 2020-10-14 ENCOUNTER — Ambulatory Visit: Payer: Medicare Other

## 2020-10-14 ENCOUNTER — Other Ambulatory Visit: Payer: Self-pay

## 2020-10-14 DIAGNOSIS — M25632 Stiffness of left wrist, not elsewhere classified: Secondary | ICD-10-CM

## 2020-10-14 DIAGNOSIS — M25532 Pain in left wrist: Secondary | ICD-10-CM

## 2020-10-14 DIAGNOSIS — R262 Difficulty in walking, not elsewhere classified: Secondary | ICD-10-CM

## 2020-10-14 DIAGNOSIS — M25551 Pain in right hip: Secondary | ICD-10-CM

## 2020-10-14 DIAGNOSIS — M6281 Muscle weakness (generalized): Secondary | ICD-10-CM

## 2020-10-14 NOTE — Therapy (Signed)
[place] [facility] PH[facility]TS MEDICINE [address], [place], [address] Phone: [phone]   Fax:  [phone]  Physical Therapy Treatment  Patient Details  Name: Diana Powell MRN: [id number] Date of Birth: 07-30-1926 Referring Provider (PT): Clarisa Kindred, FNP   Encounter Date: 10/14/2020   PT End of Session - 10/14/20 1616    Visit Number 40    Number of Visits 40    Date for PT Re-Evaluation 10/20/20    Authorization Type Recert to be performed at visit 40    PT Start Time 1430    PT Stop Time 1511    PT Time Calculation (min) 41 min    Equipment Utilized During Treatment Gait belt    Activity Tolerance Patient limited by fatigue;Patient tolerated treatment well;Treatment limited secondary to medical complications (Comment)    Behavior During Therapy [facility] for tasks assessed/performed           Past Medical History:  Diagnosis Date  . Atrial fibrillation (HCC)   . CHF (congestive heart failure) (HCC)   . Chronic systolic heart failure (HCC)   . Complication of anesthesia    CONFUSION   . COPD (chronic obstructive pulmonary disease) (HCC)   . Osteoarthritis, multiple sites   . Osteoporosis, post-menopausal   . Presence of permanent cardiac pacemaker     Past Surgical History:  Procedure Laterality Date  . CARDIOVERSION  ~2009  . HIP FRACTURE SURGERY Bilateral [phone]   each hip fractured at separate times  . INSERT / REPLACE / REMOVE PACEMAKER    . IR EXCHANGE BILIARY DRAIN  10/05/2019  . TOTAL HIP ARTHROPLASTY Right 04/20/2018   Procedure: TOTAL HIP CONVERSION;  Surgeon: Kennedy Bucker, MD;  Location: [facility] ORS;  Service: Orthopedics;  Laterality: Right;    There were no vitals filed for this visit.   Subjective Assessment - 10/14/20 1610    Subjective Patient's son reports she had a rough couple of days and had to use increased O2 in the meantime. Patient's son states she has been good today and has been able to  ambulate better from the bedroom to the kitchen.    Patient is accompained by: Family member    Pertinent History ECHO 09/02/19: Moderate MVR, moderate TVR, mild AVR, EF: 45-50%. Recent Adjustments to apixaban and furosemide doses based on age/weight. Most recent Hb: 8.8 on iron supplementation, per PCP note down from baseline of 11.6 (09/14/19). Pt recently had a sleep study completed showing nocturnal hypoxia now on O2 over-night.    Limitations Lifting;Standing;Walking    How long can you sit comfortably? unlimited    How long can you stand comfortably? 15-20 minutes    How long can you walk comfortably? unknown, has been mostly performing household mobility since DC    Patient Stated Goals improved functional tolerance to AMB regarding breathing limitations    Currently in Pain? No/denies    Pain Onset More than a month ago               Treatment Today: Nustep (seat #7) L3  x 10 minutes for cardiovascular endurance Vitals after:  SpO2 97% 70 HR BPM   Walking with FWW: 600 ft today;  Vitals after:  SpO2 96% 82 BPM Seated rest break as pt reports fatigue after this and amb    Sit to Stand: 2x10 (from standard height chair), rest break in between session Vitals after: 98% 122 BPM     Stairs up/down  4 steps  each trial x 4  Vitals after:  Sp02 95% 78 BPM  Side stepping with B UE support from therapist 29' of stepping x 2  Vitals after  SpO2 95% 80 BPM- 83BPM       PT Education - 10/14/20 1612    Education Details form/technqiue with exercise    Person(s) Educated Patient;Caregiver(s)    Methods Explanation;Demonstration    Comprehension Verbalized understanding;Returned demonstration            PT Short Term Goals - 05/19/20 1458      PT SHORT TERM GOAL #1   Title After 4 weeks pt will tolerate 4 minutes sustained AMB c gait speed > 0.21m/s to improve capacity to perform safe household distance AMB.    Baseline At evaluation 12/25/19: 3 minute tolerance  at 0.56m/s c RW; 05/19/20: 610ft 4:35 min    Time 4    Period Weeks    Status Achieved    Target Date 01/22/20      PT SHORT TERM GOAL #2   Title After 4 weeks pt to demonstrate improved 5xSTS from 19" surface, hands free in <16sec.    Baseline At eval 12/25/19: 20sec from chair with hand push-off.: 05/19/20: 17.5 in 11.85 sec    Time 4    Period Weeks    Status Achieved    Target Date 01/22/20             PT Long Term Goals - 09/02/20 1639      PT LONG TERM GOAL #1   Title After 8 weeks pt to demonstrate ability to perform 5 continuous minutes AMB @ >0.80m/s c RW. (577ft in )    Baseline 360ft; 05/22/2019: 69ft; 06/19/2019: 527ft; 08/01/2019: at eval: 3 minutes only or 345ft.; 03/25/20: 416 in 5 minutes; 05/19/20: 625ft in 4:35 min    Time 8    Period Weeks    Status Achieved      PT LONG TERM GOAL #2   Title After 8 weeks pt will demonstrate improve SLS >10sec BILAT at supervision level, no UE for recovery.    Baseline At eval 12/25/19: RLE ~3sec; LLE ~1sec; 03/25/20: RLE ~5sec  LLE ~5sec; 05/19/20: RLE 8sec  LLE 9sec,   08/25/20: 7LLE, 4RLE    Time 8    Period Weeks    Status On-going      PT LONG TERM GOAL #3   Title At 12 weeks pt will tolerate 15 minutes moderate aerobic actiivty on recumbent stepper with supplemental O2 PRN to prepar for community based exercise compliance at DC.    Baseline Not performing; 03/25/20: 10 minutes    Time 12    Period Weeks    Status Achieved      PT LONG TERM GOAL #4   Title In 12 weeks patient will improve 5 x STS to under 12sec to decrease fall risk and improve LE functional strength.    Baseline 5xSTS: 27 sec; 05/22/2019: 20 sec; 06/19/2019: 20sec; 08/01/2019: Eval 12/25/19: 20sec; 03/25/20: 13.5 sec; 05/19/20: 11.85sec    Time 12    Period Weeks    Status Achieved      PT LONG TERM GOAL #5   Title Patient will improve distance to 725ft to show improvment in cardiovascular endurance    Baseline 08/25/20- 630ft (5:47, needed  rest)    Time 8    Period Weeks    Status New  Plan - 10/14/20 1616    Clinical Impression Statement Patient demonstrates improvement in cardiovascular endurance and with SpO2 staying between 95% and 98% during today's session. However, patient is still having to take frequent rest breaks during exercise due to fatigue. Patient will benefit from skilled physical therapy to improve functional capacity.    Personal Factors and Comorbidities Comorbidity 2;Past/Current Experience;Other    Comorbidities CHF, DVT, THA, CVA, "lung disorder"    Examination-Activity Limitations Carry;Lift;Locomotion Level;Stand;Stairs;Bend;Bathing;Dressing    Examination-Participation Restrictions Cleaning;Community Activity;Meal Prep    Stability/Clinical Decision Making Evolving/Moderate complexity    Rehab Potential Fair    Clinical Impairments Affecting Rehab Potential (+) highly motivated, family support (-) age, CHF, lack of opportunies for physical activity    PT Frequency 2x / week    PT Duration 12 weeks    PT Treatment/Interventions Electrical Stimulation;Iontophoresis 4mg /ml Dexamethasone;Aquatic Therapy;Cryotherapy;Moist Heat;Therapeutic activities;Patient/family education;Manual techniques;Balance training;Stair training;Gait training;Neuromuscular re-education;Therapeutic exercise;Passive range of motion    PT Next Visit Plan Continue LE Strengthening/endurance/ Recert including visit from 08/05/20    PT Home Exercise Plan not yet established    Consulted and Agree with Plan of Care Patient;Family member/caregiver    Family Member Consulted Son           Patient will benefit from skilled therapeutic intervention in order to improve the following deficits and impairments:  Abnormal gait,Pain,Decreased coordination,Decreased mobility,Increased muscle spasms,Decreased endurance,Decreased range of motion,Decreased strength,Decreased balance,Difficulty walking,Decreased activity  tolerance,Cardiopulmonary status limiting activity,Dizziness  Visit Diagnosis: Muscle weakness (generalized)  Stiffness of left wrist, not elsewhere classified  Difficulty in walking, not elsewhere classified  Pain in left wrist  Pain in right hip     Problem List Patient Active Problem List   Diagnosis Date Noted  . Malnutrition of moderate degree 11/08/2019  . COPD with acute exacerbation (HCC) 11/06/2019  . S/P laparoscopic cholecystectomy 10/30/19 11/06/2019  . CHF (congestive heart failure) (HCC) 11/06/2019  . CKD (chronic kidney disease) stage 3, GFR 30-59 ml/min (HCC) 11/06/2019  . Acute on chronic systolic CHF (congestive heart failure) (HCC) 11/06/2019  . Diastolic CHF (HCC) 11/06/2019  . Acute respiratory failure with hypoxia (HCC)   . Acute kidney injury superimposed on CKD (HCC)   . Acute cholecystitis 08/31/2019  . RUQ pain   . Chronic obstructive pulmonary disease (HCC)   . Acute DVT (deep venous thrombosis) (HCC) 04/29/2018  . 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 AF (paroxysmal atrial fibrillation) (HCC)   . Chronic systolic CHF (congestive heart failure) (HCC)   . Osteoporosis, post-menopausal   . Osteoarthritis, multiple sites     [place], SPT 10/14/2020, 4:41 PM  [place] [facility] PHYSICAL AND SPORTS MEDICINE [address], [place] Phone: [phone]   Fax:  [phone]  Name: Samon Dishner MRN: Genia Hotter Date of Birth: 04-21-1927

## 2020-10-21 ENCOUNTER — Other Ambulatory Visit: Payer: Self-pay

## 2020-10-21 ENCOUNTER — Ambulatory Visit: Payer: Medicare Other | Attending: Geriatric Medicine

## 2020-10-21 DIAGNOSIS — R262 Difficulty in walking, not elsewhere classified: Secondary | ICD-10-CM | POA: Insufficient documentation

## 2020-10-21 DIAGNOSIS — M25532 Pain in left wrist: Secondary | ICD-10-CM | POA: Diagnosis present

## 2020-10-21 DIAGNOSIS — M25551 Pain in right hip: Secondary | ICD-10-CM | POA: Diagnosis present

## 2020-10-21 DIAGNOSIS — M6281 Muscle weakness (generalized): Secondary | ICD-10-CM | POA: Diagnosis not present

## 2020-10-21 NOTE — Therapy (Signed)
[facility] [address], [place], [address] Phone: [phone]   Fax:  [phone]  Physical Therapy Treatment  Patient Details  Name: Diana Powell MRN: [id number] Date of Birth: 1926/12/25 Referring Provider (PT): Clarisa Kindred, FNP   Encounter Date: 10/21/2020   PT End of Session - 10/21/20 1639    Visit Number 41    Number of Visits 40    Date for PT Re-Evaluation 10/20/20    Authorization Type Recert to be performed at visit 40    PT Start Time 1515    PT Stop Time 1600    PT Time Calculation (min) 45 min    Equipment Utilized During Treatment Gait belt    Activity Tolerance Patient limited by fatigue;Patient tolerated treatment well;Treatment limited secondary to medical complications (Comment)    Behavior During Therapy [facility] for tasks assessed/performed           Past Medical History:  Diagnosis Date  . Atrial fibrillation (HCC)   . CHF (congestive heart failure) (HCC)   . Chronic systolic heart failure (HCC)   . Complication of anesthesia    CONFUSION   . COPD (chronic obstructive pulmonary disease) (HCC)   . Osteoarthritis, multiple sites   . Osteoporosis, post-menopausal   . Presence of permanent cardiac pacemaker     Past Surgical History:  Procedure Laterality Date  . CARDIOVERSION  ~2009  . HIP FRACTURE SURGERY Bilateral [phone]   each hip fractured at separate times  . INSERT / REPLACE / REMOVE PACEMAKER    . IR EXCHANGE BILIARY DRAIN  10/05/2019  . TOTAL HIP ARTHROPLASTY Right 04/20/2018   Procedure: TOTAL HIP CONVERSION;  Surgeon: Kennedy Bucker, MD;  Location: [facility] ORS;  Service: Orthopedics;  Laterality: Right;    There were no vitals filed for this visit.   Subjective Assessment - 10/21/20 1558    Subjective Patient's son reports that the past few days patient has had a decline. Patient's son states that she was fine after therapy session last week however, has worsened  since then.    Patient is accompained by: Family member    Pertinent History ECHO 09/02/19: Moderate MVR, moderate TVR, mild AVR, EF: 45-50%. Recent Adjustments to apixaban and furosemide doses based on age/weight. Most recent Hb: 8.8 on iron supplementation, per PCP note down from baseline of 11.6 (09/14/19). Pt recently had a sleep study completed showing nocturnal hypoxia now on O2 over-night.    Limitations Lifting;Standing;Walking    How long can you sit comfortably? unlimited    How long can you stand comfortably? 15-20 minutes    How long can you walk comfortably? unknown, has been mostly performing household mobility since DC    Patient Stated Goals improved functional tolerance to AMB regarding breathing limitations    Currently in Pain? No/denies    Pain Onset More than a month ago             Treatment Today: Nustep (seat #7) L3  x 10 minutes for cardiovascular endurance Vitals after:  SpO2 97% 70 HR BPM   Walking with FWW: 600 ft today;  Vitals after:  SpO2 98% 82 BPM Seated rest break as pt reports fatigue after this and amb    Sit to Stand: 2x10 (from standard height chair), rest break in between session Vitals after: 94% 123 BPM     Stairs up/down  4 steps each trial x 4  Vitals after:  Sp02 95% 76 BPM  Side stepping with B UE support from therapist 60' of stepping   Vitals after  SpO2 95% 74 BPM       PT Education - 10/21/20 1601    Education Details form/technique with exercise    Person(s) Educated Patient    Methods Explanation;Demonstration    Comprehension Verbalized understanding;Returned demonstration            PT Short Term Goals - 05/19/20 1458      PT SHORT TERM GOAL #1   Title After 4 weeks pt will tolerate 4 minutes sustained AMB c gait speed > 0.83m/s to improve capacity to perform safe household distance AMB.    Baseline At evaluation 12/25/19: 3 minute tolerance at 0.58m/s c RW; 05/19/20: 662ft 4:35 min    Time 4    Period  Weeks    Status Achieved    Target Date 01/22/20      PT SHORT TERM GOAL #2   Title After 4 weeks pt to demonstrate improved 5xSTS from 19" surface, hands free in <16sec.    Baseline At eval 12/25/19: 20sec from chair with hand push-off.: 05/19/20: 17.5 in 11.85 sec    Time 4    Period Weeks    Status Achieved    Target Date 01/22/20             PT Long Term Goals - 09/02/20 1639      PT LONG TERM GOAL #1   Title After 8 weeks pt to demonstrate ability to perform 5 continuous minutes AMB @ >0.88m/s c RW. (543ft in )    Baseline 359ft; 05/22/2019: 636ft; 06/19/2019: 512ft; 08/01/2019: at eval: 3 minutes only or 366ft.; 03/25/20: 416 in 5 minutes; 05/19/20: 649ft in 4:35 min    Time 8    Period Weeks    Status Achieved      PT LONG TERM GOAL #2   Title After 8 weeks pt will demonstrate improve SLS >10sec BILAT at supervision level, no UE for recovery.    Baseline At eval 12/25/19: RLE ~3sec; LLE ~1sec; 03/25/20: RLE ~5sec  LLE ~5sec; 05/19/20: RLE 8sec  LLE 9sec,   08/25/20: 7LLE, 4RLE    Time 8    Period Weeks    Status On-going      PT LONG TERM GOAL #3   Title At 12 weeks pt will tolerate 15 minutes moderate aerobic actiivty on recumbent stepper with supplemental O2 PRN to prepar for community based exercise compliance at DC.    Baseline Not performing; 03/25/20: 10 minutes    Time 12    Period Weeks    Status Achieved      PT LONG TERM GOAL #4   Title In 12 weeks patient will improve 5 x STS to under 12sec to decrease fall risk and improve LE functional strength.    Baseline 5xSTS: 27 sec; 05/22/2019: 20 sec; 06/19/2019: 20sec; 08/01/2019: Eval 12/25/19: 20sec; 03/25/20: 13.5 sec; 05/19/20: 11.85sec    Time 12    Period Weeks    Status Achieved      PT LONG TERM GOAL #5   Title Patient will improve distance to 723ft to show improvment in cardiovascular endurance    Baseline 08/25/20- 666ft (5:47, needed rest)    Time 8    Period Weeks    Status New                  Plan - 10/21/20 1640    Clinical Impression Statement Patient continues to  demonstrate improvement with cardiovascular endurance. Patient was also able to move on the Nustep more efficiently averaging about 57 SPM. Patient still requires frequent rest breaks and becomes quickly fatigued. Patient will further benefit from skilled therapy to improve functional capacity.    Personal Factors and Comorbidities Comorbidity 2;Past/Current Experience;Other    Comorbidities CHF, DVT, THA, CVA, "lung disorder"    Examination-Activity Limitations Carry;Lift;Locomotion Level;Stand;Stairs;Bend;Bathing;Dressing    Examination-Participation Restrictions Cleaning;Community Activity;Meal Prep    Stability/Clinical Decision Making Evolving/Moderate complexity    Rehab Potential Fair    Clinical Impairments Affecting Rehab Potential (+) highly motivated, family support (-) age, CHF, lack of opportunies for physical activity    PT Frequency 2x / week    PT Duration 12 weeks    PT Treatment/Interventions Electrical Stimulation;Iontophoresis 4mg /ml Dexamethasone;Aquatic Therapy;Cryotherapy;Moist Heat;Therapeutic activities;Patient/family education;Manual techniques;Balance training;Stair training;Gait training;Neuromuscular re-education;Therapeutic exercise;Passive range of motion    PT Next Visit Plan Continue LE Strengthening/endurance/ Recert including visit from 08/05/20    PT Home Exercise Plan not yet established    Consulted and Agree with Plan of Care Patient;Family member/caregiver    Family Member Consulted Son           Patient will benefit from skilled therapeutic intervention in order to improve the following deficits and impairments:  Abnormal gait,Pain,Decreased coordination,Decreased mobility,Increased muscle spasms,Decreased endurance,Decreased range of motion,Decreased strength,Decreased balance,Difficulty walking,Decreased activity tolerance,Cardiopulmonary status limiting  activity,Dizziness  Visit Diagnosis: Muscle weakness (generalized)  Difficulty in walking, not elsewhere classified     Problem List Patient Active Problem List   Diagnosis Date Noted  . Malnutrition of moderate degree 11/08/2019  . COPD with acute exacerbation (HCC) 11/06/2019  . S/P laparoscopic cholecystectomy 10/30/19 11/06/2019  . CHF (congestive heart failure) (HCC) 11/06/2019  . CKD (chronic kidney disease) stage 3, GFR 30-59 ml/min (HCC) 11/06/2019  . Acute on chronic systolic CHF (congestive heart failure) (HCC) 11/06/2019  . Diastolic CHF (HCC) 11/06/2019  . Acute respiratory failure with hypoxia (HCC)   . Acute kidney injury superimposed on CKD (HCC)   . Acute cholecystitis 08/31/2019  . RUQ pain   . Chronic obstructive pulmonary disease (HCC)   . Acute DVT (deep venous thrombosis) (HCC) 04/29/2018  . 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 AF (paroxysmal atrial fibrillation) (HCC)   . Chronic systolic CHF (congestive heart failure) (HCC)   . Osteoporosis, post-menopausal   . Osteoarthritis, multiple sites     05/06/2013, SPT 10/21/2020, 4:57 PM  [facility] PHYSICAL AND SPORTS MEDICINE [address], [place] Phone: [phone]   Fax:  [phone]  Name: Lora Chavers MRN: Genia Hotter Date of Birth: 27-Jan-1927

## 2020-10-28 ENCOUNTER — Other Ambulatory Visit: Payer: Self-pay

## 2020-10-28 ENCOUNTER — Ambulatory Visit: Payer: Medicare Other

## 2020-10-28 DIAGNOSIS — M6281 Muscle weakness (generalized): Secondary | ICD-10-CM | POA: Diagnosis not present

## 2020-10-28 DIAGNOSIS — R262 Difficulty in walking, not elsewhere classified: Secondary | ICD-10-CM

## 2020-10-28 DIAGNOSIS — M25532 Pain in left wrist: Secondary | ICD-10-CM

## 2020-10-28 NOTE — Therapy (Signed)
[facility] [address], [place], [address] Phone: [phone]   Fax:  [phone]  Physical Therapy Reassessment and Re-certification   Patient Details  Name: Diana Powell MRN: [id number] Date of Birth: August 04, 1926 Referring Provider (PT): Clarisa Kindred, FNP   Encounter Date: 10/28/2020   PT End of Session - 10/28/20 1658    Visit Number 42    Number of Visits 57    Date for PT Re-Evaluation 12/23/20     Medicare Part B    Authorization Time Period 10/28/20-12/23/20    PT Start Time 1520    PT Stop Time 1600    PT Time Calculation (min) 40 min    Equipment Utilized During Treatment Gait belt    Activity Tolerance Patient limited by fatigue;Patient tolerated treatment well;Treatment limited secondary to medical complications (Comment)    Behavior During Therapy [facility] for tasks assessed/performed           Past Medical History:  Diagnosis Date  . Atrial fibrillation (HCC)   . CHF (congestive heart failure) (HCC)   . Chronic systolic heart failure (HCC)   . Complication of anesthesia    CONFUSION   . COPD (chronic obstructive pulmonary disease) (HCC)   . Osteoarthritis, multiple sites   . Osteoporosis, post-menopausal   . Presence of permanent cardiac pacemaker     Past Surgical History:  Procedure Laterality Date  . CARDIOVERSION  ~2009  . HIP FRACTURE SURGERY Bilateral [phone]   each hip fractured at separate times  . INSERT / REPLACE / REMOVE PACEMAKER    . IR EXCHANGE BILIARY DRAIN  10/05/2019  . TOTAL HIP ARTHROPLASTY Right 04/20/2018   Procedure: TOTAL HIP CONVERSION;  Surgeon: Kennedy Bucker, MD;  Location: [facility] ORS;  Service: Orthopedics;  Laterality: Right;    There were no vitals filed for this visit.   Subjective Assessment - 10/28/20 1654    Subjective Patient's son states that patient has had good and bad days in the past two months. Patient's son reports that patient still requires  the use of O2  at home to ambulate throughout the house and increased occurrence of shortness of breath with mobility since the last therapy session.    Patient is accompained by: Family member    Pertinent History ECHO 09/02/19: Moderate MVR, moderate TVR, mild AVR, EF: 45-50%. Recent Adjustments to apixaban and furosemide doses based on age/weight. Most recent Hb: 8.8 on iron supplementation, per PCP note down from baseline of 11.6 (09/14/19). Pt recently had a sleep study completed showing nocturnal hypoxia now on O2 over-night.    Limitations Lifting;Standing;Walking    How long can you sit comfortably? unlimited    How long can you stand comfortably? 15-20 minutes    How long can you walk comfortably? unknown, has been mostly performing household mobility since DC    Patient Stated Goals improved functional tolerance to AMB regarding breathing limitations    Currently in Pain? No/denies    Pain Onset More than a month ago           Treatment Today: Nustep (seat #7) L3  x 10 minutes for cardiovascular endurance Vitals after:  SpO2 96% 70 HR BPM   Walking with FWW: 600 ft today;  Vitals after:  SpO2 97% 80 BPM Seated rest break as pt reports fatigue after this and amb    Sit to Stand: 2x10 (from standard height chair), rest break in between session - Done without UE  support, however - grabbed knees to assists movement Vitals after 97% 129 BPM     Stairs up/down with UE support 4 steps each trial x 4  Vitals after:  Sp02 95% 73 BPM  Performed to address cardiovascular endurance.    PT Education - 10/28/20 1657    Education Details form/technique with exercise    Person(s) Educated Patient    Methods Explanation;Demonstration    Comprehension Verbalized understanding;Returned demonstration            PT Short Term Goals - 05/19/20 1458      PT SHORT TERM GOAL #1   Title After 4 weeks pt will tolerate 4 minutes sustained AMB c gait speed > 0.32m/s to improve  capacity to perform safe household distance AMB.    Baseline At evaluation 12/25/19: 3 minute tolerance at 0.37m/s c RW; 05/19/20: 654ft 4:35 min    Time 4    Period Weeks    Status Achieved    Target Date 01/22/20      PT SHORT TERM GOAL #2   Title After 4 weeks pt to demonstrate improved 5xSTS from 19" surface, hands free in <16sec.    Baseline At eval 12/25/19: 20sec from chair with hand push-off.: 05/19/20: 17.5 in 11.85 sec    Time 4    Period Weeks    Status Achieved    Target Date 01/22/20             PT Long Term Goals - 10/28/20 1701      PT LONG TERM GOAL #1   Title After 8 weeks pt to demonstrate ability to perform 5 continuous minutes AMB @ >0.65m/s c RW. (547ft in )    Baseline 391ft; 05/22/2019: 661ft; 06/19/2019: 558ft; 08/01/2019: at eval: 3 minutes only or 375ft.; 03/25/20: 416 in 5 minutes; 05/19/20: 625ft in 4:35 min    Time 8    Period Weeks    Status Achieved      PT LONG TERM GOAL #2   Title After 8 weeks pt will demonstrate improve SLS >10sec BILAT at supervision level, no UE for recovery.    Baseline At eval 12/25/19: RLE ~3sec; LLE ~1sec; 03/25/20: RLE ~5sec  LLE ~5sec; 05/19/20: RLE 8sec  LLE 9sec,   08/25/20: 7LLE, 4RLE    Time 8    Period Weeks    Status On-going      PT LONG TERM GOAL #3   Title At 12 weeks pt will tolerate 15 minutes moderate aerobic actiivty on recumbent stepper with supplemental O2 PRN to prepar for community based exercise compliance at DC.    Baseline Not performing; 03/25/20: 10 minutes    Time 12    Period Weeks    Status On Going       PT LONG TERM GOAL #4   Title In 12 weeks patient will improve 5 x STS to under 12sec to decrease fall risk and improve LE functional strength.    Baseline 5xSTS: 27 sec; 05/22/2019: 20 sec; 06/19/2019: 20sec; 08/01/2019: Eval 12/25/19: 20sec; 03/25/20: 13.5 sec; 05/19/20: 11.85sec    Time 12    Period Weeks    Status Achieved      PT LONG TERM GOAL #5   Title Patient will improve  distance to 773ft to show improvment in cardiovascular endurance    Baseline 08/25/20- 681ft (5:47, needed rest), 10/28/20: 632ft (5:15 stopped for rest)    Time 8    Period Weeks    Status On-going  Target Date 12/23/20                 Plan - 10/28/20 1743    Clinical Impression Statement Pt recerttified this date at request of the patient's son who also acts as [place] interpreter. Pt's son reports the patient still needs therapy to help improve her tolerance to AMB in the house (now limited by dyspnea on exertion) as well as improved ability to perform stairs, and ability to perform SLS (albeit no specific changes in patient balance are mentioned since prior cert period). Reassessment reveals similar performance in sustained AMB and sustained use of Nustep recumbent stepper. Pt has continued intolerance of sustained AMB AEB by , largely unchanged since last few testing. Pt continues to tolerate Nustep as a cross training modality for cardiovascular conditioning. The patient's son continues to remain focused on the patients dyspnea with activity at home, is quick to apply supplemental oxygen despite although oximetry at home is not used as an indicator in these situations (per son this date). Pt's son has been educated in past sessions about the patient's multiple co morbidities that can contribute to dyspnea, however he has not been open to this education and remains fixated on hypoxia as the main problem despite any evidence of hypoxia within most recent sessions. Pt has activity limiting dyspnea this date, but never desaturates below the 90s% on room air. Will continue to work toward remaining goals of treatment, however without sufficient evident of progress, pt will need to be assumed at her new baseline for AMB, strength, actiivty tolerance, and balance. Patient will continue to benefit from skilled therapy to maximize safety and indepdence with basic mobility in home required for ADL  performance. .   Personal Factors and Comorbidities Comorbidity 2;Past/Current Experience;Other    Comorbidities CHF, DVT, THA, CVA, "lung disorder"    Examination-Activity Limitations Carry;Lift;Locomotion Level;Stand;Stairs;Bend;Bathing;Dressing    Examination-Participation Restrictions Cleaning;Community Activity;Meal Prep    Stability/Clinical Decision Making Evolving/Moderate complexity    Rehab Potential Fair    Clinical Impairments Affecting Rehab Potential (+) highly motivated, family support (-) age, CHF, lack of opportunies for physical activity    PT Frequency 2x / week    PT Duration 12 weeks    PT Treatment/Interventions Electrical Stimulation;Iontophoresis 4mg /ml Dexamethasone;Aquatic Therapy;Cryotherapy;Moist Heat;Therapeutic activities;Patient/family education;Manual techniques;Balance training;Stair training;Gait training;Neuromuscular re-education;Therapeutic exercise;Passive range of motion    PT Next Visit Plan Progress activity tolerance in gait.    PT Home Exercise Plan    Consulted and Agree with Plan of Care Patient;Family member/caregiver    Family Member Consulted Son           Patient will benefit from skilled therapeutic intervention in order to improve the following deficits and impairments:  Abnormal gait,Pain,Decreased coordination,Decreased mobility,Increased muscle spasms,Decreased endurance,Decreased range of motion,Decreased strength,Decreased balance,Difficulty walking,Decreased activity tolerance,Cardiopulmonary status limiting activity,Dizziness  Visit Diagnosis: Muscle weakness (generalized)  Difficulty in walking, not elsewhere classified  Pain in left wrist     Problem List Patient Active Problem List   Diagnosis Date Noted  . Malnutrition of moderate degree 11/08/2019  . COPD with acute exacerbation (HCC) 11/06/2019  . S/P laparoscopic cholecystectomy 10/30/19 11/06/2019  . CHF (congestive heart failure) (HCC) 11/06/2019  . CKD (chronic  kidney disease) stage 3, GFR 30-59 ml/min (HCC) 11/06/2019  . Acute on chronic systolic CHF (congestive heart failure) (HCC) 11/06/2019  . Diastolic CHF (HCC) 11/06/2019  . Acute respiratory failure with hypoxia (HCC)   . Acute kidney injury superimposed on CKD (HCC)   .  Acute cholecystitis 08/31/2019  . RUQ pain   . Chronic obstructive pulmonary disease (HCC)   . Acute DVT (deep venous thrombosis) (HCC) 04/29/2018  . 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 AF (paroxysmal atrial fibrillation) (HCC)   . Chronic systolic CHF (congestive heart failure) (HCC)   . Osteoporosis, post-menopausal   . Osteoarthritis, multiple sites    Jacksonwaldaylor Ludwika Rodd, SPT  10/28/2020, 5:14PM   Buccola,Allan C, 10/28/2020, 6:16 PM  [facility] [address] [place], [place], [id number] Phone: [phone]   Fax:  [phone]  Name: Genia HotterGalina Powell MRN: [id number] Date of Birth: 15-Aug-1926

## 2020-11-04 ENCOUNTER — Other Ambulatory Visit: Payer: Self-pay

## 2020-11-04 ENCOUNTER — Ambulatory Visit: Payer: Medicare Other

## 2020-11-04 DIAGNOSIS — M6281 Muscle weakness (generalized): Secondary | ICD-10-CM

## 2020-11-04 DIAGNOSIS — M25551 Pain in right hip: Secondary | ICD-10-CM

## 2020-11-04 DIAGNOSIS — R262 Difficulty in walking, not elsewhere classified: Secondary | ICD-10-CM

## 2020-11-04 NOTE — Therapy (Signed)
[facility] PHYS[facility] AND SPORTS MEDICINE [address], [place], [address] Phone: [phone]   Fax:  [phone]  Physical Therapy Treatment  Patient Details  Name: Diana Powell MRN: [id number] Date of Birth: 10-Mar-1927 Referring Provider (PT): Clarisa Kindred, FNP   Encounter Date: 11/04/2020   PT End of Session - 11/04/20 1619    Visit Number 43    Number of Visits 57    Date for PT Re-Evaluation 12/23/20    Authorization Type Recert to be performed at visit 40    Authorization Time Period 10/28/20-12/23/20    PT Start Time 1518    PT Stop Time 1600    PT Time Calculation (min) 42 min    Equipment Utilized During Treatment Gait belt    Activity Tolerance Patient limited by fatigue;Patient tolerated treatment well;Treatment limited secondary to medical complications (Comment)    Behavior During Therapy [facility] for tasks assessed/performed           Past Medical History:  Diagnosis Date  . Atrial fibrillation (HCC)   . CHF (congestive heart failure) (HCC)   . Chronic systolic heart failure (HCC)   . Complication of anesthesia    CONFUSION   . COPD (chronic obstructive pulmonary disease) (HCC)   . Osteoarthritis, multiple sites   . Osteoporosis, post-menopausal   . Presence of permanent cardiac pacemaker     Past Surgical History:  Procedure Laterality Date  . CARDIOVERSION  ~2009  . HIP FRACTURE SURGERY Bilateral [phone]   each hip fractured at separate times  . INSERT / REPLACE / REMOVE PACEMAKER    . IR EXCHANGE BILIARY DRAIN  10/05/2019  . TOTAL HIP ARTHROPLASTY Right 04/20/2018   Procedure: TOTAL HIP CONVERSION;  Surgeon: Kennedy Bucker, MD;  Location: [facility] ORS;  Service: Orthopedics;  Laterality: Right;    There were no vitals filed for this visit.   Subjective Assessment - 11/04/20 1616    Subjective Patient's son reports that she feels better since the last therapy session. Patient's son states that she is still  frequently requiring the use of O2 at home.    Patient is accompained by: Family member    Pertinent History ECHO 09/02/19: Moderate MVR, moderate TVR, mild AVR, EF: 45-50%. Recent Adjustments to apixaban and furosemide doses based on age/weight. Most recent Hb: 8.8 on iron supplementation, per PCP note down from baseline of 11.6 (09/14/19). Pt recently had a sleep study completed showing nocturnal hypoxia now on O2 over-night.    Limitations Lifting;Standing;Walking    How long can you sit comfortably? unlimited    How long can you stand comfortably? 15-20 minutes    How long can you walk comfortably? unknown, has been mostly performing household mobility since DC    Patient Stated Goals improved functional tolerance to AMB regarding breathing limitations    Currently in Pain? No/denies    Pain Onset More than a month ago           Treatment Today: Nustep (seat #7) L3  x 10 minutes for cardiovascular endurance and LE strength. Maintaining SPM > or equal to 55 Vitals after:  SpO2 97% 70HR BPM   Walking with FWW: 700 ft today; SBA Vitals after:  SpO2 97% 93 BPM Seated rest break as pt reports fatigue after this and amb    Sit to Stand: x 22 (from standard height chair), rest break in between session Done without UE support, however grabbed knees to assist movement Vitals after SPO2 96%  121 BPM     Stairs up/down with UE support and step through pattern. 4 steps each trial x 4  Vitals after:  Sp02 96% 74 BPM  B soccer kicks for SLS x 10 - Min guarding  Vitals after:  SpO2: 98% 87 BPM  B alternating foot taps on step for SLS x 10  - Performed with guarding from therapist and light UE support  Vitals after: SpO2: 97%  70 BPM   Performed to address cardiovascular endurance and LE strength/balance.       PT Education - 11/04/20 1619    Education Details form/technique with exercise    Person(s) Educated Patient    Methods Explanation;Demonstration     Comprehension Verbalized understanding;Returned demonstration            PT Short Term Goals - 05/19/20 1458      PT SHORT TERM GOAL #1   Title After 4 weeks pt will tolerate 4 minutes sustained AMB c gait speed > 0.27m/s to improve capacity to perform safe household distance AMB.    Baseline At evaluation 12/25/19: 3 minute tolerance at 0.83m/s c RW; 05/19/20: 669ft 4:35 min    Time 4    Period Weeks    Status Achieved    Target Date 01/22/20      PT SHORT TERM GOAL #2   Title After 4 weeks pt to demonstrate improved 5xSTS from 19" surface, hands free in <16sec.    Baseline At eval 12/25/19: 20sec from chair with hand push-off.: 05/19/20: 17.5 in 11.85 sec    Time 4    Period Weeks    Status Achieved    Target Date 01/22/20             PT Long Term Goals - 10/28/20 1701      PT LONG TERM GOAL #1   Title After 8 weeks pt to demonstrate ability to perform 5 continuous minutes AMB @ >0.51m/s c RW. (524ft in )    Baseline 351ft; 05/22/2019: 62ft; 06/19/2019: 562ft; 08/01/2019: at eval: 3 minutes only or 340ft.; 03/25/20: 416 in 5 minutes; 05/19/20: 649ft in 4:35 min    Time 8    Period Weeks    Status Achieved      PT LONG TERM GOAL #2   Title After 8 weeks pt will demonstrate improve SLS >10sec BILAT at supervision level, no UE for recovery.    Baseline At eval 12/25/19: RLE ~3sec; LLE ~1sec; 03/25/20: RLE ~5sec  LLE ~5sec; 05/19/20: RLE 8sec  LLE 9sec,   08/25/20: 7LLE, 4RLE    Time 8    Period Weeks    Status On-going      PT LONG TERM GOAL #3   Title At 12 weeks pt will tolerate 15 minutes moderate aerobic actiivty on recumbent stepper with supplemental O2 PRN to prepar for community based exercise compliance at DC.    Baseline Not performing; 03/25/20: 10 minutes    Time 12    Period Weeks    Status Achieved      PT LONG TERM GOAL #4   Title In 12 weeks patient will improve 5 x STS to under 12sec to decrease fall risk and improve LE functional strength.    Baseline  5xSTS: 27 sec; 05/22/2019: 20 sec; 06/19/2019: 20sec; 08/01/2019: Eval 12/25/19: 20sec; 03/25/20: 13.5 sec; 05/19/20: 11.85sec    Time 12    Period Weeks    Status Achieved      PT LONG TERM GOAL #5  Title Patient will improve distance to 749ft to show improvment in cardiovascular endurance    Baseline 08/25/20- 673ft (5:47, needed rest), 10/28/20: 660ft (5:15 stopped for rest)    Time 8    Period Weeks    Status On-going    Target Date 12/23/20                 Plan - 11/04/20 1620    Clinical Impression Statement Patient demonstrates increased cardiovascular endurance by ambulating for 772ft without a rest break. Pt's walking capacity has been 600'. Patient's SpO2 was frequently monitored staying around <94% but > 90% throughout the duration of the therapy session. Patient has some increased diffculty with balance while performing alternating foot taps on step but did not require use of RW. Patient will further benefit from skilled therapy to return to prior level of function.    Personal Factors and Comorbidities Comorbidity 2;Past/Current Experience;Other    Comorbidities CHF, DVT, THA, CVA, "lung disorder"    Examination-Activity Limitations Carry;Lift;Locomotion Level;Stand;Stairs;Bend;Bathing;Dressing    Examination-Participation Restrictions Cleaning;Community Activity;Meal Prep    Stability/Clinical Decision Making Evolving/Moderate complexity    Rehab Potential Fair    Clinical Impairments Affecting Rehab Potential (+) highly motivated, family support (-) age, CHF, lack of opportunies for physical activity    PT Frequency 2x / week    PT Duration 12 weeks    PT Treatment/Interventions Electrical Stimulation;Iontophoresis 4mg /ml Dexamethasone;Aquatic Therapy;Cryotherapy;Moist Heat;Therapeutic activities;Patient/family education;Manual techniques;Balance training;Stair training;Gait training;Neuromuscular re-education;Therapeutic exercise;Passive range of motion    PT Next  Visit Plan Continue LE Strengthening/endurance/ Recert including visit from 08/05/20    PT Home Exercise Plan not yet established    Consulted and Agree with Plan of Care Patient;Family member/caregiver    Family Member Consulted Son           Patient will benefit from skilled therapeutic intervention in order to improve the following deficits and impairments:  Abnormal gait,Pain,Decreased coordination,Decreased mobility,Increased muscle spasms,Decreased endurance,Decreased range of motion,Decreased strength,Decreased balance,Difficulty walking,Decreased activity tolerance,Cardiopulmonary status limiting activity,Dizziness  Visit Diagnosis: Muscle weakness (generalized)  Difficulty in walking, not elsewhere classified  Pain in right hip     Problem List Patient Active Problem List   Diagnosis Date Noted  . Malnutrition of moderate degree 11/08/2019  . COPD with acute exacerbation (HCC) 11/06/2019  . S/P laparoscopic cholecystectomy 10/30/19 11/06/2019  . CHF (congestive heart failure) (HCC) 11/06/2019  . CKD (chronic kidney disease) stage 3, GFR 30-59 ml/min (HCC) 11/06/2019  . Acute on chronic systolic CHF (congestive heart failure) (HCC) 11/06/2019  . Diastolic CHF (HCC) 11/06/2019  . Acute respiratory failure with hypoxia (HCC)   . Acute kidney injury superimposed on CKD (HCC)   . Acute cholecystitis 08/31/2019  . RUQ pain   . Chronic obstructive pulmonary disease (HCC)   . Acute DVT (deep venous thrombosis) (HCC) 04/29/2018  . 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 AF (paroxysmal atrial fibrillation) (HCC)   . Chronic systolic CHF (congestive heart failure) (HCC)   . Osteoporosis, post-menopausal   . Osteoarthritis, multiple sites     [place]. Fairly IV, PT, DPT Physical Therapist- [place]  [facility]  11/04/2020, 7:33 PM  [facility] PHYSICAL AND SPORTS MEDICINE [address], [place] Phone: [phone]   Fax:  [phone]  Name: Garrie Elenes MRN: Genia Hotter Date of Birth: 08-18-1926

## 2020-11-11 ENCOUNTER — Other Ambulatory Visit: Payer: Self-pay

## 2020-11-11 ENCOUNTER — Ambulatory Visit: Payer: Medicare Other

## 2020-11-11 DIAGNOSIS — M6281 Muscle weakness (generalized): Secondary | ICD-10-CM

## 2020-11-11 DIAGNOSIS — R262 Difficulty in walking, not elsewhere classified: Secondary | ICD-10-CM

## 2020-11-11 DIAGNOSIS — M25551 Pain in right hip: Secondary | ICD-10-CM

## 2020-11-11 NOTE — Therapy (Signed)
[facility]TS MEDICINE [address] [place], [address] Phone: [phone]   Fax:  [phone]  Physical Therapy Treatment  Patient Details  Name: Diana Powell MRN: [id number] Date of Birth: 1926/08/13 Referring Provider (PT): Clarisa Kindred, FNP   Encounter Date: 11/11/2020   PT End of Session - 11/11/20 1604    Visit Number 44    Number of Visits 57    Date for PT Re-Evaluation 12/23/20    Authorization Type Recert to be performed at visit 40    Authorization Time Period 10/28/20-12/23/20    PT Start Time 1515    PT Stop Time 1558    PT Time Calculation (min) 43 min    Equipment Utilized During Treatment Gait belt    Activity Tolerance Patient limited by fatigue;Patient tolerated treatment well;Treatment limited secondary to medical complications (Comment)    Behavior During Therapy [facility] for tasks assessed/performed           Past Medical History:  Diagnosis Date  . Atrial fibrillation (HCC)   . CHF (congestive heart failure) (HCC)   . Chronic systolic heart failure (HCC)   . Complication of anesthesia    CONFUSION   . COPD (chronic obstructive pulmonary disease) (HCC)   . Osteoarthritis, multiple sites   . Osteoporosis, post-menopausal   . Presence of permanent cardiac pacemaker     Past Surgical History:  Procedure Laterality Date  . CARDIOVERSION  ~2009  . HIP FRACTURE SURGERY Bilateral [phone]   each hip fractured at separate times  . INSERT / REPLACE / REMOVE PACEMAKER    . IR EXCHANGE BILIARY DRAIN  10/05/2019  . TOTAL HIP ARTHROPLASTY Right 04/20/2018   Procedure: TOTAL HIP CONVERSION;  Surgeon: Kennedy Bucker, MD;  Location: [facility] ORS;  Service: Orthopedics;  Laterality: Right;    There were no vitals filed for this visit.   Subjective Assessment - 11/11/20 1520    Subjective Patient's son states that patient is doing better today than she has been feeling in the past couple of days. Patient's  son reports that she requires less use of O2 at home while ambulating from different rooms.    Patient is accompained by: Family member    Pertinent History ECHO 09/02/19: Moderate MVR, moderate TVR, mild AVR, EF: 45-50%. Recent Adjustments to apixaban and furosemide doses based on age/weight. Most recent Hb: 8.8 on iron supplementation, per PCP note down from baseline of 11.6 (09/14/19). Pt recently had a sleep study completed showing nocturnal hypoxia now on O2 over-night.    Limitations Lifting;Standing;Walking    How long can you sit comfortably? unlimited    How long can you stand comfortably? 15-20 minutes    How long can you walk comfortably? unknown, has been mostly performing household mobility since DC    Patient Stated Goals improved functional tolerance to AMB regarding breathing limitations    Currently in Pain? No/denies    Pain Onset More than a month ago           Nustep (seat #7) L3  x 10 minutes for cardiovascular endurance and LE strength. Maintaining SPM > 60 SPM Vitals after:  SpO2 96% 70HR 5PM   Walking with FWW: 600 ft today; SBA Vitals after:  SpO2 97% 78 BPM Seated rest break as pt reports fatigue after this and amb    Sit to Stand: x 22 (from standard height chair), rest break in between session Done without UE support, however grabbed knees to  assist movement Vitals after SPO2 98% 126 BPM     Stairs up/down with UE support and step through pattern. SBA 4 steps each trial x 4  Vitals after:  Sp02 95% 72 BPM     B alternating foot taps on step for SLS 2 x 10   Performed with guarding from therapist and w/o UE support   Vitals after: SpO2: 96%  89 BPM  Alternating seated knee extensions 2 x 10        Performed to address cardiovascular endurance and LE strength/balance.    PT Education - 11/11/20 1603    Education Details form/technique with exercise    Person(s) Educated Patient    Methods Explanation;Demonstration    Comprehension  Verbalized understanding;Returned demonstration            PT Short Term Goals - 05/19/20 1458      PT SHORT TERM GOAL #1   Title After 4 weeks pt will tolerate 4 minutes sustained AMB c gait speed > 0.73m/s to improve capacity to perform safe household distance AMB.    Baseline At evaluation 12/25/19: 3 minute tolerance at 0.34m/s c RW; 05/19/20: 671ft 4:35 min    Time 4    Period Weeks    Status Achieved    Target Date 01/22/20      PT SHORT TERM GOAL #2   Title After 4 weeks pt to demonstrate improved 5xSTS from 19" surface, hands free in <16sec.    Baseline At eval 12/25/19: 20sec from chair with hand push-off.: 05/19/20: 17.5 in 11.85 sec    Time 4    Period Weeks    Status Achieved    Target Date 01/22/20             PT Long Term Goals - 10/28/20 1701      PT LONG TERM GOAL #1   Title After 8 weeks pt to demonstrate ability to perform 5 continuous minutes AMB @ >0.69m/s c RW. (513ft in )    Baseline 361ft; 05/22/2019: 666ft; 06/19/2019: 543ft; 08/01/2019: at eval: 3 minutes only or 341ft.; 03/25/20: 416 in 5 minutes; 05/19/20: 635ft in 4:35 min    Time 8    Period Weeks    Status Achieved      PT LONG TERM GOAL #2   Title After 8 weeks pt will demonstrate improve SLS >10sec BILAT at supervision level, no UE for recovery.    Baseline At eval 12/25/19: RLE ~3sec; LLE ~1sec; 03/25/20: RLE ~5sec  LLE ~5sec; 05/19/20: RLE 8sec  LLE 9sec,   08/25/20: 7LLE, 4RLE    Time 8    Period Weeks    Status On-going      PT LONG TERM GOAL #3   Title At 12 weeks pt will tolerate 15 minutes moderate aerobic actiivty on recumbent stepper with supplemental O2 PRN to prepar for community based exercise compliance at DC.    Baseline Not performing; 03/25/20: 10 minutes    Time 12    Period Weeks    Status Achieved      PT LONG TERM GOAL #4   Title In 12 weeks patient will improve 5 x STS to under 12sec to decrease fall risk and improve LE functional strength.    Baseline 5xSTS: 27 sec;  05/22/2019: 20 sec; 06/19/2019: 20sec; 08/01/2019: Eval 12/25/19: 20sec; 03/25/20: 13.5 sec; 05/19/20: 11.85sec    Time 12    Period Weeks    Status Achieved      PT LONG TERM  GOAL #5   Title Patient will improve distance to 758ft to show improvment in cardiovascular endurance    Baseline 08/25/20- 681ft (5:47, needed rest), 10/28/20: 662ft (5:15 stopped for rest)    Time 8    Period Weeks    Status On-going    Target Date 12/23/20                 Plan - 11/11/20 1605    Clinical Impression Statement Overall, patient continues to show improvements with cardiovascular endurance while walking 646ft without breaks and ascending/descending stairs with UE support. Patient's SpO2 was continously monitored throughout the session staying around <95% but >92% throughout the therapy session today. Patient still has increased diffculty with performing alternating foot taps with slower pace, stability, and increased quad muscle weakness during the exercise. Patient will further benefit from skilled therapy to return to prior level of function.    Personal Factors and Comorbidities Comorbidity 2;Past/Current Experience;Other    Comorbidities CHF, DVT, THA, CVA, "lung disorder"    Examination-Activity Limitations Carry;Lift;Locomotion Level;Stand;Stairs;Bend;Bathing;Dressing    Examination-Participation Restrictions Cleaning;Community Activity;Meal Prep    Stability/Clinical Decision Making Evolving/Moderate complexity    Rehab Potential Fair    Clinical Impairments Affecting Rehab Potential (+) highly motivated, family support (-) age, CHF, lack of opportunies for physical activity    PT Frequency 2x / week    PT Duration 12 weeks    PT Treatment/Interventions Electrical Stimulation;Iontophoresis 4mg /ml Dexamethasone;Aquatic Therapy;Cryotherapy;Moist Heat;Therapeutic activities;Patient/family education;Manual techniques;Balance training;Stair training;Gait training;Neuromuscular  re-education;Therapeutic exercise;Passive range of motion    PT Next Visit Plan Continue LE Strengthening/endurance/ Recert including visit from 08/05/20    PT Home Exercise Plan not yet established    Consulted and Agree with Plan of Care Patient;Family member/caregiver    Family Member Consulted Son           Patient will benefit from skilled therapeutic intervention in order to improve the following deficits and impairments:  Abnormal gait,Pain,Decreased coordination,Decreased mobility,Increased muscle spasms,Decreased endurance,Decreased range of motion,Decreased strength,Decreased balance,Difficulty walking,Decreased activity tolerance,Cardiopulmonary status limiting activity,Dizziness  Visit Diagnosis: Difficulty in walking, not elsewhere classified  Muscle weakness (generalized)  Pain in right hip     Problem List Patient Active Problem List   Diagnosis Date Noted  . Malnutrition of moderate degree 11/08/2019  . COPD with acute exacerbation (HCC) 11/06/2019  . S/P laparoscopic cholecystectomy 10/30/19 11/06/2019  . CHF (congestive heart failure) (HCC) 11/06/2019  . CKD (chronic kidney disease) stage 3, GFR 30-59 ml/min (HCC) 11/06/2019  . Acute on chronic systolic CHF (congestive heart failure) (HCC) 11/06/2019  . Diastolic CHF (HCC) 11/06/2019  . Acute respiratory failure with hypoxia (HCC)   . Acute kidney injury superimposed on CKD (HCC)   . Acute cholecystitis 08/31/2019  . RUQ pain   . Chronic obstructive pulmonary disease (HCC)   . Acute DVT (deep venous thrombosis) (HCC) 04/29/2018  . 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 AF (paroxysmal atrial fibrillation) (HCC)   . Chronic systolic CHF (congestive heart failure) (HCC)   . Osteoporosis, post-menopausal   . Osteoarthritis, multiple sites     [place]. Fairly IV, PT, DPT Physical Therapist- [place]   [facility]  11/11/2020, 4:24 PM  [facility] PHYSICAL AND SPORTS MEDICINE [address], [address], [place] Phone: [phone]   Fax:  [phone]  Name: Diana Powell MRN: Genia Hotter Date of Birth: [birth year]

## 2020-11-18 ENCOUNTER — Other Ambulatory Visit: Payer: Self-pay

## 2020-11-18 ENCOUNTER — Ambulatory Visit: Payer: Medicare Other | Attending: Geriatric Medicine | Admitting: Physical Therapy

## 2020-11-18 DIAGNOSIS — M6281 Muscle weakness (generalized): Secondary | ICD-10-CM | POA: Insufficient documentation

## 2020-11-18 DIAGNOSIS — R262 Difficulty in walking, not elsewhere classified: Secondary | ICD-10-CM | POA: Insufficient documentation

## 2020-11-18 DIAGNOSIS — M25551 Pain in right hip: Secondary | ICD-10-CM | POA: Insufficient documentation

## 2020-11-19 ENCOUNTER — Encounter: Payer: Self-pay | Admitting: Physical Therapy

## 2020-11-19 NOTE — Therapy (Signed)
[place] [facility] PHYSICAL AND SPORTS MEDICINE [address] [place], [place], [address] Phone: [phone]   Fax:  [phone]  Physical Therapy Treatment  Patient Details  Name: Genia HotterGalina Carbone MRN: [id number] Date of Birth: [birth year] Referring Provider (PT): Clarisa Kindredina Hackney, FNP   Encounter Date: 11/18/2020   PT End of Session - 11/19/20 0819    Visit Number 45    Number of Visits 57    Date for PT Re-Evaluation 12/23/20    Authorization Type Recert to be performed at visit 40    Authorization Time Period 10/28/20-12/23/20    PT Start Time 1515    PT Stop Time 1600    PT Time Calculation (min) 45 min    Equipment Utilized During Treatment Gait belt    Activity Tolerance Patient limited by fatigue;Patient tolerated treatment well;Treatment limited secondary to medical complications (Comment)    [facility] for tasks assessed/performed           Past Medical History:  Diagnosis Date  . Atrial fibrillation (HCC)   . CHF (congestive heart failure) (HCC)   . Chronic systolic heart failure (HCC)   . Complication of anesthesia    CONFUSION   . COPD (chronic obstructive pulmonary disease) (HCC)   . Osteoarthritis, multiple sites   . Osteoporosis, post-menopausal   . Presence of permanent cardiac pacemaker     Past Surgical History:  Procedure Laterality Date  . CARDIOVERSION  ~2009  . HIP FRACTURE SURGERY Bilateral [phone]   each hip fractured at separate times  . INSERT / REPLACE / REMOVE PACEMAKER    . IR EXCHANGE BILIARY DRAIN  10/05/2019  . TOTAL HIP ARTHROPLASTY Right 04/20/2018   Procedure: TOTAL HIP CONVERSION;  Surgeon: Kennedy BuckerMenz, Michael, MD;  Location: [facility] ORS;  Service: Orthopedics;  Laterality: Right;    There were no vitals filed for this visit.   Subjective Assessment - 11/19/20 0815    Subjective Patient's son reports patient is having increased difficulty with ambulation today. Patient's son also states that patient  had a fall in the house a few days ago while reaching to grab an bject from the floor, patient reports no injuries sustained.    Patient is accompained by: Family member    Pertinent History ECHO 09/02/19: Moderate MVR, moderate TVR, mild AVR, EF: 45-50%. Recent Adjustments to apixaban and furosemide doses based on age/weight. Most recent Hb: 8.8 on iron supplementation, per PCP note down from baseline of 11.6 (09/14/19). Pt recently had a sleep study completed showing nocturnal hypoxia now on O2 over-night.    Limitations Lifting;Standing;Walking    How long can you sit comfortably? unlimited    How long can you stand comfortably? 15-20 minutes    How long can you walk comfortably? unknown, has been mostly performing household mobility since DC    Patient Stated Goals improved functional tolerance to AMB regarding breathing limitations    Currently in Pain? No/denies    Pain Onset More than a month ago                       Nustep (seat #7) L3  x 10 minutes for cardiovascular endurance and LE strength. Maintaining SPM > 60 SPM. Pt tried to tolerate level 4 but patient reported increased difficulty  Vitals after:  SpO2 96% 70HR 5PM   Walking with FWW: 600 ft today; SBA Vitals after:  SpO2 97% 83BPM Seated rest break as pt reports fatigue after this  and amb    Sit toStand: x 20 (from standard height chair), rest break in between session Done without UE support, however grabbed knees to assist movement Vitals after SPO2 96% 117 BPM/     Stairs up/down with UE support and step through pattern. SBA 4 steps each trial x 4  Vitals after:  Sp02 95% 72 BPM     B alternating SLS foot cone taps 2 x 10    Performed with mod-max guarding from therapist    Vitals after: SpO2: 96%  89 BPM    Squats with shoe box lifts x 10. VC for patient to bend knees, straighten back, and widen BOS        Performed to address cardiovascular endurance and LE  strength/balance.       PT Education - 11/19/20 0819    Education Details form/technique with exercise    Person(s) Educated Patient    Methods Explanation;Demonstration    Comprehension Verbalized understanding;Returned demonstration            PT Short Term Goals - 05/19/20 1458      PT SHORT TERM GOAL #1   Title After 4 weeks pt will tolerate 4 minutes sustained AMB c gait speed > 0.39m/s to improve capacity to perform safe household distance AMB.    Baseline At evaluation 12/25/19: 3 minute tolerance at 0.44m/s c RW; 05/19/20: 637ft 4:35 min    Time 4    Period Weeks    Status Achieved    Target Date 01/22/20      PT SHORT TERM GOAL #2   Title After 4 weeks pt to demonstrate improved 5xSTS from 19" surface, hands free in <16sec.    Baseline At eval 12/25/19: 20sec from chair with hand push-off.: 05/19/20: 17.5 in 11.85 sec    Time 4    Period Weeks    Status Achieved    Target Date 01/22/20             PT Long Term Goals - 10/28/20 1701      PT LONG TERM GOAL #1   Title After 8 weeks pt to demonstrate ability to perform 5 continuous minutes AMB @ >0.25m/s c RW. (545ft in )    Baseline 375ft; 05/22/2019: 661ft; 06/19/2019: 565ft; 08/01/2019: at eval: 3 minutes only or 370ft.; 03/25/20: 416 in 5 minutes; 05/19/20: 626ft in 4:35 min    Time 8    Period Weeks    Status Achieved      PT LONG TERM GOAL #2   Title After 8 weeks pt will demonstrate improve SLS >10sec BILAT at supervision level, no UE for recovery.    Baseline At eval 12/25/19: RLE ~3sec; LLE ~1sec; 03/25/20: RLE ~5sec  LLE ~5sec; 05/19/20: RLE 8sec  LLE 9sec,   08/25/20: 7LLE, 4RLE    Time 8    Period Weeks    Status On-going      PT LONG TERM GOAL #3   Title At 12 weeks pt will tolerate 15 minutes moderate aerobic actiivty on recumbent stepper with supplemental O2 PRN to prepar for community based exercise compliance at DC.    Baseline Not performing; 03/25/20: 10 minutes    Time 12    Period Weeks     Status Achieved      PT LONG TERM GOAL #4   Title In 12 weeks patient will improve 5 x STS to under 12sec to decrease fall risk and improve LE functional strength.    Baseline 5xSTS:  27 sec; 05/22/2019: 20 sec; 06/19/2019: 20sec; 08/01/2019: Eval 12/25/19: 20sec; 03/25/20: 13.5 sec; 05/19/20: 11.85sec    Time 12    Period Weeks    Status Achieved      PT LONG TERM GOAL #5   Title Patient will improve distance to 791ft to show improvment in cardiovascular endurance    Baseline 08/25/20- 662ft (5:47, needed rest), 10/28/20: 617ft (5:15 stopped for rest)    Time 8    Period Weeks    Status On-going    Target Date 12/23/20                 Plan - 11/19/20 0820    Clinical Impression Statement Patient continues to demonstrate improvements with cardiovascular endurance during 622ft walking program and sit to stand exercises. Patient's SpO2 was continuously monitored throughout the therapy session staying around <96% but >93%. Patient demonstrates increased difficulty with balance and control with performing alternating SLS foot cone taps.  Discussed reasessment and discharge for patient next visit to wellness program for maintenance. Pts son is hesitant to d/c planning, but after PT education on patient progress, and lack of skilled PT being utilized during intervention, seems more open to considering a wellness program in a more personal training setting that is most appropriate for current patient funciton. PT will attempt d/c next session.    Personal Factors and Comorbidities Comorbidity 2;Past/Current Experience;Other    Comorbidities CHF, DVT, THA, CVA, "lung disorder"    Examination-Activity Limitations Carry;Lift;Locomotion Level;Stand;Stairs;Bend;Bathing;Dressing    Examination-Participation Restrictions Cleaning;Community Activity;Meal Prep    Stability/Clinical Decision Making Evolving/Moderate complexity    Clinical Decision Making Moderate    Rehab Potential Fair    Clinical  Impairments Affecting Rehab Potential (+) highly motivated, family support (-) age, CHF, lack of opportunies for physical activity    PT Frequency 2x / week    PT Duration 12 weeks    PT Treatment/Interventions Electrical Stimulation;Iontophoresis 4mg /ml Dexamethasone;Aquatic Therapy;Cryotherapy;Moist Heat;Therapeutic activities;Patient/family education;Manual techniques;Balance training;Stair training;Gait training;Neuromuscular re-education;Therapeutic exercise;Passive range of motion    PT Next Visit Plan Continue LE Strengthening/endurance/ Recert including visit from 08/05/20    PT Home Exercise Plan not yet established    Consulted and Agree with Plan of Care Patient;Family member/caregiver    Family Member Consulted Son           Patient will benefit from skilled therapeutic intervention in order to improve the following deficits and impairments:  Abnormal gait,Pain,Decreased coordination,Decreased mobility,Increased muscle spasms,Decreased endurance,Decreased range of motion,Decreased strength,Decreased balance,Difficulty walking,Decreased activity tolerance,Cardiopulmonary status limiting activity,Dizziness  Visit Diagnosis: Difficulty in walking, not elsewhere classified  Pain in right hip  Muscle weakness (generalized)     Problem List Patient Active Problem List   Diagnosis Date Noted  . Malnutrition of moderate degree 11/08/2019  . COPD with acute exacerbation (HCC) 11/06/2019  . S/P laparoscopic cholecystectomy 10/30/19 11/06/2019  . CHF (congestive heart failure) (HCC) 11/06/2019  . CKD (chronic kidney disease) stage 3, GFR 30-59 ml/min (HCC) 11/06/2019  . Acute on chronic systolic CHF (congestive heart failure) (HCC) 11/06/2019  . Diastolic CHF (HCC) 11/06/2019  . Acute respiratory failure with hypoxia (HCC)   . Acute kidney injury superimposed on CKD (HCC)   . Acute cholecystitis 08/31/2019  . RUQ pain   . Chronic obstructive pulmonary disease (HCC)   . Acute  DVT (deep venous thrombosis) (HCC) 04/29/2018  . 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 AF (  paroxysmal atrial fibrillation) (HCC)   . Chronic systolic CHF (congestive heart failure) (HCC)   . Osteoporosis, post-menopausal   . Osteoarthritis, multiple sites    Hilda Lias DPT Hilda Lias, SPT 11/19/2020, 11:15 AM  [facility] PHYSICAL AND SPORTS MEDICINE [address], [place], [address] Phone: [phone]   Fax:  [phone]  Name: Mikhaila Roh MRN: [id number] Date of Birth: 1927/07/01

## 2020-11-25 ENCOUNTER — Ambulatory Visit: Payer: Medicare Other

## 2020-11-25 ENCOUNTER — Other Ambulatory Visit: Payer: Self-pay

## 2020-11-25 DIAGNOSIS — R262 Difficulty in walking, not elsewhere classified: Secondary | ICD-10-CM

## 2020-11-25 DIAGNOSIS — M6281 Muscle weakness (generalized): Secondary | ICD-10-CM

## 2020-11-25 DIAGNOSIS — M25551 Pain in right hip: Secondary | ICD-10-CM

## 2020-11-25 NOTE — Therapy (Signed)
[place] PHYSICAL AND SPORTS MEDICINE [address] [place], [address] Phone: [phone]   Fax:  [phone]  Physical Therapy Treatment/Discharge Summary  Patient Details  Name: Diana Powell MRN: [id number] Date of Birth: [birth year] Referring Provider (PT): Darylene Price, FNP   Encounter Date: 11/25/2020   PT End of Session - 11/25/20 2027    Visit Number 65    Number of Visits 33    Date for PT Re-Evaluation 12/23/20    Authorization Type Recert to be performed at visit 40    Authorization Time Period 10/28/20-12/23/20    PT Start Time 1516    PT Stop Time 1600    PT Time Calculation (min) 44 min    Equipment Utilized During Treatment Gait belt    Activity Tolerance Patient limited by fatigue;Patient tolerated treatment well;Treatment limited secondary to medical complications (Comment)    Behavior During Therapy [facility] for tasks assessed/performed           Past Medical History:  Diagnosis Date  . Atrial fibrillation ([place])   . CHF (congestive heart failure) ([place])   . Chronic systolic heart failure ([place])   . Complication of anesthesia    CONFUSION   . COPD (chronic obstructive pulmonary disease) ([place])   . Osteoarthritis, multiple sites   . Osteoporosis, post-menopausal   . Presence of permanent cardiac pacemaker     Past Surgical History:  Procedure Laterality Date  . CARDIOVERSION  ~2009  . HIP FRACTURE SURGERY Bilateral [phone]   each hip fractured at separate times  . INSERT / REPLACE / REMOVE PACEMAKER    . IR EXCHANGE BILIARY DRAIN  10/05/2019  . TOTAL HIP ARTHROPLASTY Right 04/20/2018   Procedure: TOTAL HIP CONVERSION;  Surgeon: Hessie Knows, MD;  Location: [facility] ORS;  Service: Orthopedics;  Laterality: Right;    There were no vitals filed for this visit.   Subjective Assessment - 11/25/20 2025    Subjective No new complaints from pt. Pt's son has concerns about pt discharging from therapy today due to thining  pt will become less active at home causing her a decline in function.    Patient is accompained by: Family member    Pertinent History ECHO 09/02/19: Moderate MVR, moderate TVR, mild AVR, EF: 45-50%. Recent Adjustments to apixaban and furosemide doses based on age/weight. Most recent Hb: 8.8 on iron supplementation, per PCP note down from baseline of 11.6 (09/14/19). Pt recently had a sleep study completed showing nocturnal hypoxia now on O2 over-night.    Limitations Lifting;Standing;Walking    How long can you sit comfortably? unlimited    How long can you stand comfortably? 15-20 minutes    How long can you walk comfortably? unknown, has been mostly performing household mobility since DC    Patient Stated Goals improved functional tolerance to AMB regarding breathing limitations    Currently in Pain? No/denies    Pain Onset More than a month ago          Refer to long term goals and clinical impression for updates on pt progress in PT. Discharged from PT at this time due to pt demonstrating good functional mobility and lack of skilled PT services being performed. Pt and pt's son educated on progressive LE HEP and outside resources offered in community for pt to continue to remain physically active to remain at current functional level. Educated to receive new order for PT at next MD visit in early June if they notice significant decline  in functional mobility.   Nu-Step L4 for 5 min. UE/LE use for LE strengthening. Given and educated on progressive LE HEP  Reassessment of SLS goal and 6MWT goal    PT Education - 11/25/20 2026    Education Details Pt and pt's son given hand outs of local exercise resources for pt to remain active after PT d/c. Also given progressive LE strength HEP.    Person(s) Educated Child(ren);Patient    Methods Explanation;Demonstration;Handout    Comprehension Verbalized understanding            PT Short Term Goals - 05/19/20 1458      PT SHORT TERM GOAL #1    Title After 4 weeks pt will tolerate 4 minutes sustained AMB c gait speed > 0.19ms to improve capacity to perform safe household distance AMB.    Baseline At evaluation 12/25/19: 3 minute tolerance at 0.645m c RW; 05/19/20: 60042f:35 min    Time 4    Period Weeks    Status Achieved    Target Date 01/22/20      PT SHORT TERM GOAL #2   Title After 4 weeks pt to demonstrate improved 5xSTS from 19" surface, hands free in <16sec.    Baseline At eval 12/25/19: 20sec from chair with hand push-off.: 05/19/20: 17.5 in 11.85 sec    Time 4    Period Weeks    Status Achieved    Target Date 01/22/20             PT Long Term Goals - 11/25/20 2033      PT LONG TERM GOAL #1   Title After 8 weeks pt to demonstrate ability to perform 5 continuous minutes AMB @ >0.33m19m RW. (590ft64f5minu59m)    Baseline 375ft; 66f/2020: 600ft; 149f/2020: 500ft; 0178f2021: at eval: 3 minutes only or 300ft.; 0920f21: 416 in 5 minutes; 05/19/20: 600ft in 4:29fin    Time 8    Period Weeks    Status Achieved      PT LONG TERM GOAL #2   Title After 8 weeks pt will demonstrate improve SLS >10sec BILAT at supervision level, no UE for recovery.    Baseline At eval 12/25/19: RLE ~3sec; LLE ~1sec; 03/25/20: RLE ~5sec  LLE ~5sec; 05/19/20: RLE 8sec  LLE 9sec,   08/25/20: 7LLE, 4RLE; 11/25/20: 5 sec on RLE, 7 sec on LLE    Time 8    Period Weeks    Status On-going    Target Date 11/25/20      PT LONG TERM GOAL #3   Title At 12 weeks pt will tolerate 15 minutes moderate aerobic actiivty on recumbent stepper with supplemental O2 PRN to prepar for community based exercise compliance at DC.    Baseline Not performing; 03/25/20: 10 minutes    Time 12    Period Weeks    Status Achieved      PT LONG TERM GOAL #4   Title In 12 weeks patient will improve 5 x STS to under 12sec to decrease fall risk and improve LE functional strength.    Baseline 5xSTS: 27 sec; 05/22/2019: 20 sec; 06/19/2019: 20sec; 08/01/2019: Eval 12/25/19: 20sec;  03/25/20: 13.5 sec; 05/19/20: 11.85sec    Time 12    Period Weeks    Status Achieved      PT LONG TERM GOAL #5   Title Patient will improve 6MWT distance to 750ft to sho50fprovment in cardiovascular endurance    Baseline 08/25/20- 600ft (5:47, 13fed rest), 10/28/20:  619f (5:15 stopped for rest); 11/25/2020: completed whole test with no rest at 650' with RW    Time 8    Period Weeks    Status Partially Met    Target Date 11/25/20                 Plan - 11/25/20 2028    Clinical Impression Statement Pt and pt's son educated on pt D/c from therapy due to pt having high functioning and good mobility with RW use. Educated on pt strong and independent enough to benefit from Silver sneakers or other local programs to maintain current level of function and this level of service is more appropriate for the pt at this time due to pt not requiring skilled PT services. Pt made progress to 6MWT goal ambulating her furthers distance of 618 with RW with a goal of 750' but pt completed whole test with no rest break during. Pt still has difficulty with holding SLS > 10 sec. Only able to maintain SLS for 5 sec on RLE and 7 sec on LLE with no UE support. Pt and son educated that if he notices pt with a decline in function and/or mobility, receive new order for continuing PT at upcoming doctor's appointment this early June. Pt given updated, progressive LE strength HEP with resistance bands with no further questions from pt or son.    Personal Factors and Comorbidities Comorbidity 2;Past/Current Experience;Other    Comorbidities CHF, DVT, THA, CVA, "lung disorder"    Examination-Activity Limitations Carry;Lift;Locomotion Level;Stand;Stairs;Bend;Bathing;Dressing    Examination-Participation Restrictions Cleaning;Community Activity;Meal Prep    Stability/Clinical Decision Making Evolving/Moderate complexity    Clinical Decision Making Moderate    Rehab Potential Fair    Clinical Impairments Affecting Rehab  Potential (+) highly motivated, family support (-) age, CHF, lack of opportunies for physical activity    PT Frequency 2x / week    PT Duration 12 weeks    PT Treatment/Interventions Electrical Stimulation;Iontophoresis 468mml Dexamethasone;Aquatic Therapy;Cryotherapy;Moist Heat;Therapeutic activities;Patient/family education;Manual techniques;Balance training;Stair training;Gait training;Neuromuscular re-education;Therapeutic exercise;Passive range of motion    PT Next Visit Plan D/c'd from therapy.    PT Home Exercise Plan not yet established    Consulted and Agree with Plan of Care Patient;Family member/caregiver    Family Member Consulted Son           Patient will benefit from skilled therapeutic intervention in order to improve the following deficits and impairments:  Abnormal gait,Pain,Decreased coordination,Decreased mobility,Increased muscle spasms,Decreased endurance,Decreased range of motion,Decreased strength,Decreased balance,Difficulty walking,Decreased activity tolerance,Cardiopulmonary status limiting activity,Dizziness  Visit Diagnosis: Difficulty in walking, not elsewhere classified  Pain in right hip  Muscle weakness (generalized)     Problem List Patient Active Problem List   Diagnosis Date Noted  . Malnutrition of moderate degree 11/08/2019  . COPD with acute exacerbation (HCWinslow04/20/2021  . S/P laparoscopic cholecystectomy 10/30/19 11/06/2019  . CHF (congestive heart failure) (HCMiller04/20/2021  . CKD (chronic kidney disease) stage 3, GFR 30-59 ml/min (HCC) 11/06/2019  . Acute on chronic systolic CHF (congestive heart failure) (HCLittleton04/20/2021  . Diastolic CHF (HCKelso0439/76/7341. Acute respiratory failure with hypoxia (HCApple [facility]  . Acute kidney injury superimposed on CKD (HCKetch[place]  . Acute cholecystitis 08/31/2019  . RUQ pain   . Chronic obstructive pulmonary disease (HCBig [facility]  . Acute DVT (deep venous thrombosis) (HCHick[facility] [place]10/06/2018  . Status post total hip  replacement, right 04/20/2018  . Dementia due to medical condition without behavioral disturbance (HCPueblitos10/17/2014  . AF (  paroxysmal atrial fibrillation) ([place])   . Chronic systolic CHF (congestive heart failure) ([place])   . Osteoporosis, post-menopausal   . Osteoarthritis, multiple sites     [place]. Fairly IV, PT, DPT Physical Therapist- [facility]  11/25/2020, 8:47 PM  [place] PHYSICAL AND SPORTS MEDICINE [address], [place], [address] Phone: [phone]   Fax:  [phone]  Name: Diana Powell MRN: [id number] Date of Birth: 11/12/1926

## 2020-12-02 ENCOUNTER — Ambulatory Visit: Payer: Medicare Other

## 2021-07-15 IMAGING — XA IR EXCHANGE BILARY DRAIN
3 series · 3 of 3 positions shown · non-contrast
Comparison: none

INDICATION: [AGE] with history of acute cholecystitis and percutaneous
cholecystostomy tube placement. Recent drain injection demonstrated
that the tube had been slightly retracted and patient presents for
routine drain exchange.

[Series 1: fluoroscopy - stored · 1 of 1 slices shown (1 of 3)]
[im 1/1]
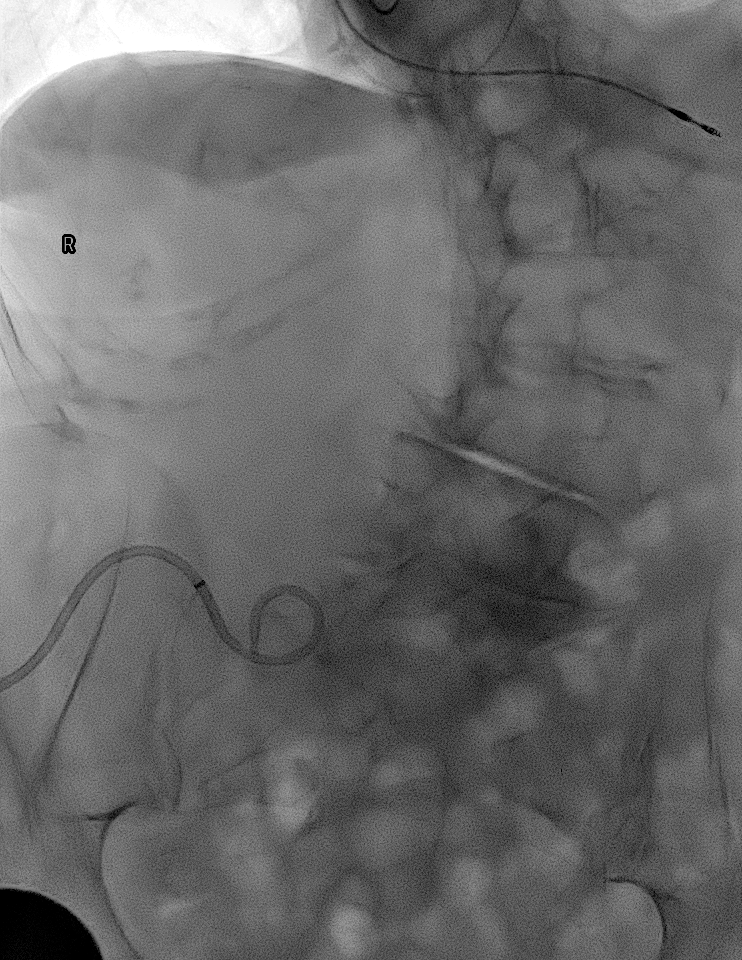

[Series 2: fluoroscopy - stored · 1 of 1 slices shown (2 of 3)]
[im 1/1]
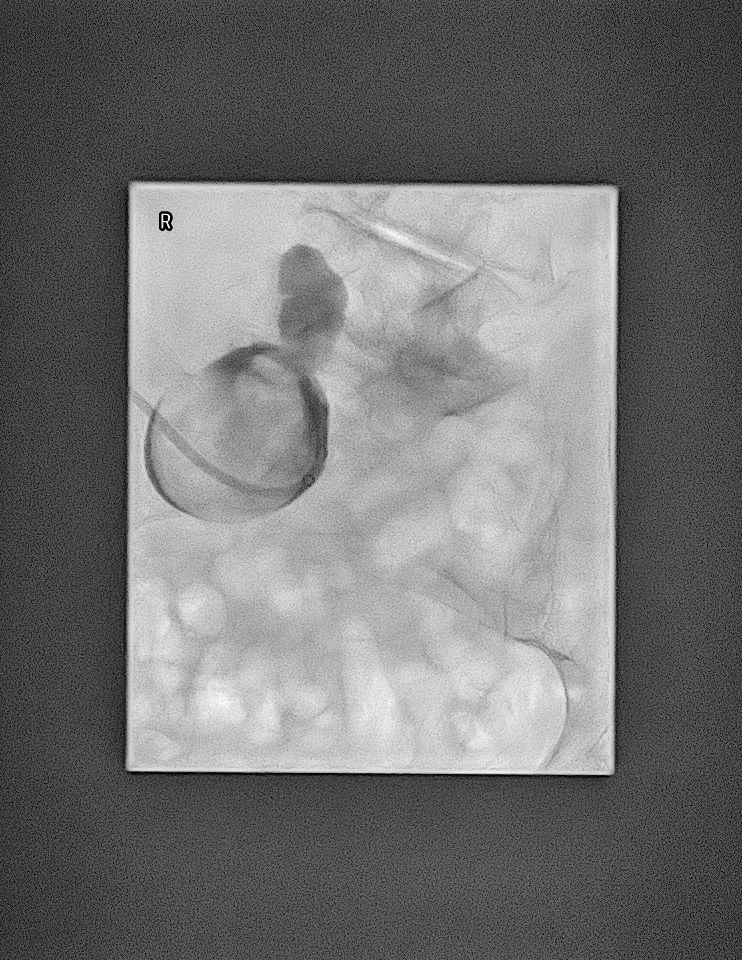

[Series 2: fluoroscopy - stored · 1 of 1 slices shown (3 of 3)]
[im 1/1]
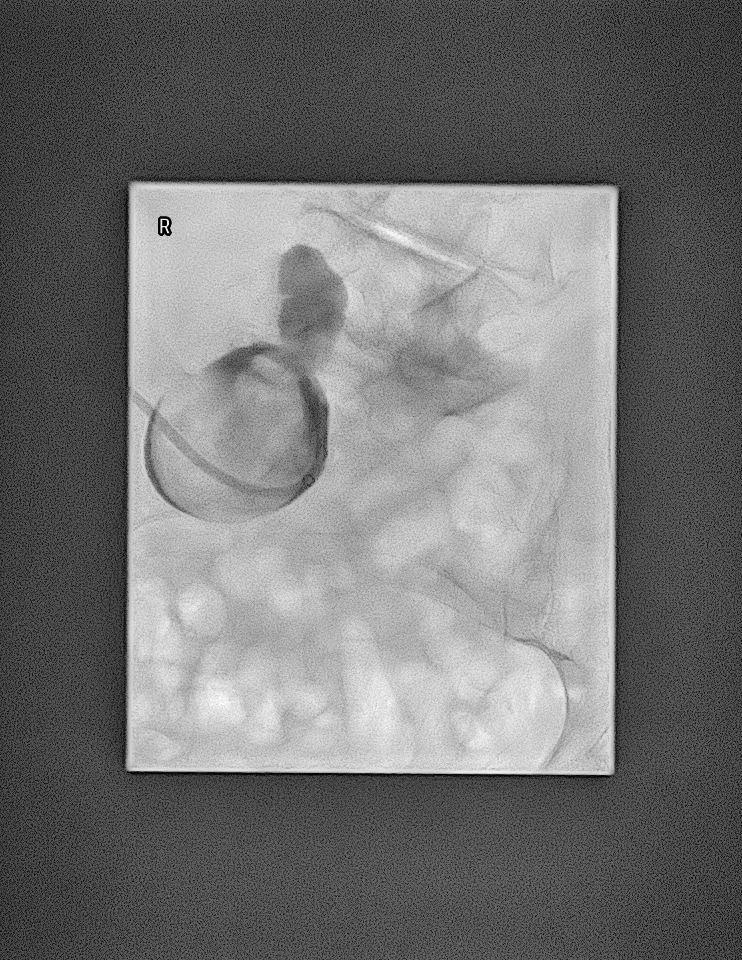

[3 of 3 positions shown; findings below may reference images not displayed]

EXAM:
CHOLECYSTOSTOMY TUBE EXCHANGE WITH FLUOROSCOPY

MEDICATIONS:
None

ANESTHESIA/SEDATION:
None

FLUOROSCOPY TIME:  Fluoroscopy Time: 36 seconds

COMPLICATIONS:
None immediate.

PROCEDURE:
The procedure was explained to the patient and son with a
translator. The risks and benefits of the procedure were discussed
and questions were addressed. Informed consent was obtained from the
patient's son.

Patient was placed supine on the interventional table. The
cholecystostomy tube was prepped and draped in sterile fashion.
Maximal barrier sterile technique was utilized including caps, mask,
sterile gowns, sterile gloves, sterile drape, hand hygiene and skin
antiseptic. Contrast was injected to confirm placement in the
gallbladder. Skin around the catheter was anesthetized with 1%
lidocaine. The retention suture was cut. Catheter was cut and
removed over a Bentson wire. New 10.2 French multipurpose drain was
advanced over the wire and placed in the gallbladder. Contrast
injection confirmed placement in the gallbladder. The tube was
flushed with saline and attached to a gravity bag. Catheter was
sutured to skin with Prolene suture.

Fluoroscopic images were taken and saved for this procedure.
FINDINGS: Cholecystostomy tube is within the gallbladder. Very large stone
within the gallbladder. No filling of the common bile duct with
drain injection.
IMPRESSION: Successful exchange of the cholecystostomy tube with fluoroscopy.

Will schedule the patient for routine exchange in 8 weeks.

## 2022-05-19 DEATH — deceased
